# Patient Record
Sex: Male | Born: 1950 | Race: White | Hispanic: No | Marital: Married | Smoking: Current every day smoker
Health system: Southern US, Community
[De-identification: ages and names within clinical notes are randomized; demographics above are authoritative.]

## PROBLEM LIST (undated history)

## (undated) ENCOUNTER — Emergency Department (HOSPITAL_COMMUNITY): Admission: EM | Payer: BC Managed Care – PPO | Source: Home / Self Care

## (undated) DIAGNOSIS — M549 Dorsalgia, unspecified: Secondary | ICD-10-CM

## (undated) DIAGNOSIS — G8929 Other chronic pain: Secondary | ICD-10-CM

## (undated) DIAGNOSIS — E039 Hypothyroidism, unspecified: Secondary | ICD-10-CM

## (undated) DIAGNOSIS — K222 Esophageal obstruction: Secondary | ICD-10-CM

## (undated) DIAGNOSIS — M1990 Unspecified osteoarthritis, unspecified site: Secondary | ICD-10-CM

## (undated) DIAGNOSIS — I4891 Unspecified atrial fibrillation: Secondary | ICD-10-CM

## (undated) DIAGNOSIS — I6529 Occlusion and stenosis of unspecified carotid artery: Secondary | ICD-10-CM

## (undated) DIAGNOSIS — Z8719 Personal history of other diseases of the digestive system: Secondary | ICD-10-CM

## (undated) DIAGNOSIS — I499 Cardiac arrhythmia, unspecified: Secondary | ICD-10-CM

## (undated) DIAGNOSIS — F1020 Alcohol dependence, uncomplicated: Secondary | ICD-10-CM

## (undated) DIAGNOSIS — D649 Anemia, unspecified: Secondary | ICD-10-CM

## (undated) DIAGNOSIS — I10 Essential (primary) hypertension: Secondary | ICD-10-CM

## (undated) DIAGNOSIS — K648 Other hemorrhoids: Secondary | ICD-10-CM

## (undated) DIAGNOSIS — I739 Peripheral vascular disease, unspecified: Secondary | ICD-10-CM

## (undated) DIAGNOSIS — E0590 Thyrotoxicosis, unspecified without thyrotoxic crisis or storm: Secondary | ICD-10-CM

## (undated) DIAGNOSIS — K529 Noninfective gastroenteritis and colitis, unspecified: Secondary | ICD-10-CM

## (undated) DIAGNOSIS — R011 Cardiac murmur, unspecified: Secondary | ICD-10-CM

## (undated) HISTORY — DX: Other hemorrhoids: K64.8

## (undated) HISTORY — DX: Personal history of other diseases of the digestive system: Z87.19

## (undated) HISTORY — DX: Occlusion and stenosis of unspecified carotid artery: I65.29

## (undated) HISTORY — DX: Unspecified osteoarthritis, unspecified site: M19.90

## (undated) HISTORY — DX: Other chronic pain: G89.29

## (undated) HISTORY — PX: OTHER SURGICAL HISTORY: SHX169

## (undated) HISTORY — DX: Other chronic pain: M54.9

## (undated) HISTORY — DX: Alcohol dependence, uncomplicated: F10.20

## (undated) HISTORY — DX: Essential (primary) hypertension: I10

## (undated) HISTORY — DX: Unspecified atrial fibrillation: I48.91

## (undated) HISTORY — DX: Noninfective gastroenteritis and colitis, unspecified: K52.9

## (undated) HISTORY — DX: Esophageal obstruction: K22.2

## (undated) HISTORY — PX: SPINE SURGERY: SHX786

---

## 2001-04-23 ENCOUNTER — Ambulatory Visit (HOSPITAL_COMMUNITY): Admission: RE | Admit: 2001-04-23 | Discharge: 2001-04-23 | Payer: Self-pay | Admitting: Internal Medicine

## 2001-04-23 ENCOUNTER — Encounter: Payer: Self-pay | Admitting: Internal Medicine

## 2001-05-08 ENCOUNTER — Ambulatory Visit (HOSPITAL_COMMUNITY): Admission: RE | Admit: 2001-05-08 | Discharge: 2001-05-08 | Payer: Self-pay | Admitting: Internal Medicine

## 2001-09-21 ENCOUNTER — Ambulatory Visit (HOSPITAL_COMMUNITY): Admission: RE | Admit: 2001-09-21 | Discharge: 2001-09-21 | Payer: Self-pay | Admitting: General Surgery

## 2003-05-09 ENCOUNTER — Ambulatory Visit (HOSPITAL_COMMUNITY): Admission: RE | Admit: 2003-05-09 | Discharge: 2003-05-09 | Payer: Self-pay | Admitting: General Surgery

## 2003-06-18 ENCOUNTER — Emergency Department (HOSPITAL_COMMUNITY): Admission: EM | Admit: 2003-06-18 | Discharge: 2003-06-18 | Payer: Self-pay | Admitting: Emergency Medicine

## 2003-06-26 ENCOUNTER — Emergency Department (HOSPITAL_COMMUNITY): Admission: EM | Admit: 2003-06-26 | Discharge: 2003-06-26 | Payer: Self-pay | Admitting: Emergency Medicine

## 2003-07-02 ENCOUNTER — Ambulatory Visit (HOSPITAL_COMMUNITY): Admission: RE | Admit: 2003-07-02 | Discharge: 2003-07-02 | Payer: Self-pay | Admitting: Family Medicine

## 2003-09-15 ENCOUNTER — Emergency Department (HOSPITAL_COMMUNITY): Admission: EM | Admit: 2003-09-15 | Discharge: 2003-09-15 | Payer: Self-pay | Admitting: Emergency Medicine

## 2003-09-16 ENCOUNTER — Inpatient Hospital Stay (HOSPITAL_COMMUNITY): Admission: EM | Admit: 2003-09-16 | Discharge: 2003-09-19 | Payer: Self-pay | Admitting: Emergency Medicine

## 2003-09-22 ENCOUNTER — Ambulatory Visit (HOSPITAL_COMMUNITY): Admission: RE | Admit: 2003-09-22 | Discharge: 2003-09-22 | Payer: Self-pay | Admitting: Family Medicine

## 2004-03-10 ENCOUNTER — Other Ambulatory Visit (HOSPITAL_COMMUNITY): Admission: RE | Admit: 2004-03-10 | Discharge: 2004-03-19 | Payer: Self-pay | Admitting: Psychiatry

## 2004-12-28 IMAGING — CT CT HEAD W/O CM
1 series · 16 of 30 positions shown, 20 images · non-contrast
Comparison: none

CLINICAL DATA: Seizure.
 CT HEAD WITHOUT CONTRAST
 No prior studies for comparison.
 Brain shows no evidence of edema, hemorrhage, mass effect, hydrocephalus or extraaxial fluid collections.  On the bone windows, there is suggestion of a small amount of fluid in superior mastoid air cells on the left.  This may be consistent with new or old mastoiditis.  No bony destruction is identified.  
 IMPRESSION
 Normal appearance of brain.  Evidence of fluid in a few superior left mastoid air cells.  Correlation is suggested with the possibility of underlying acute versus chronic mastoiditis.  No overt bony destruction seen.

[Series 8722: — · axial · 0.44mm/px · z∈[-652,-517]mm · 16 of 30 slices shown, 20 images]
[im 2/30  brain]
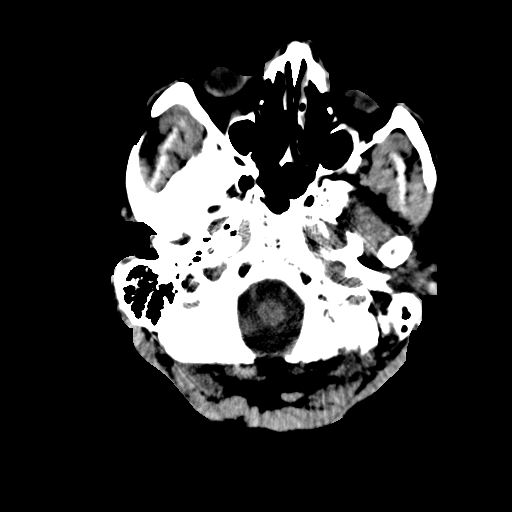
[im 2/30  bone]
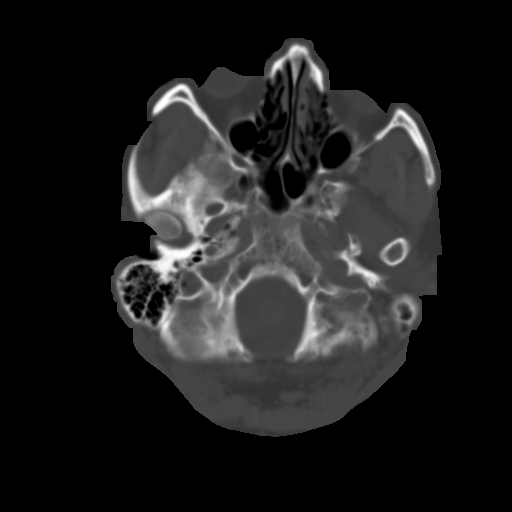
[im 4/30  brain]
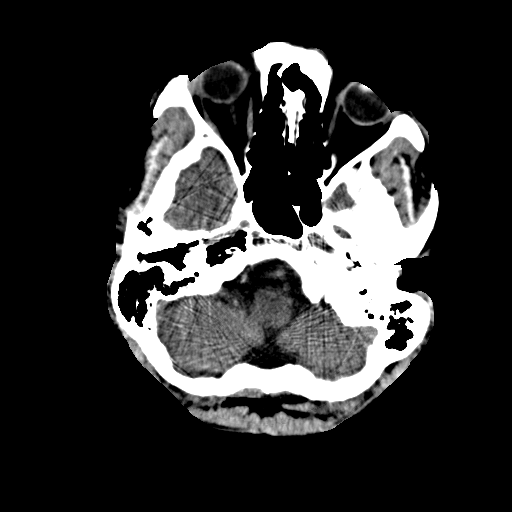
[im 6/30  brain]
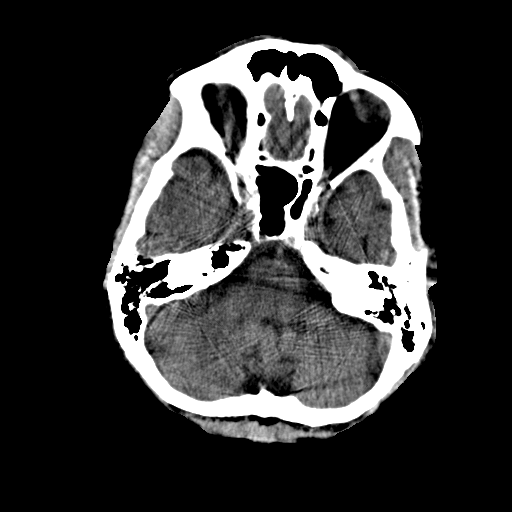
[im 8/30  brain]
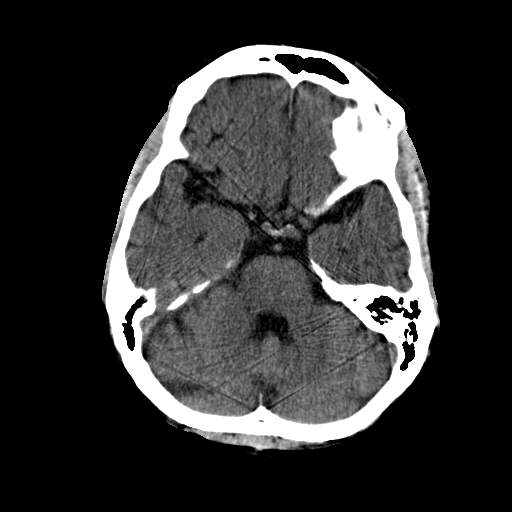
[im 9/30  brain]
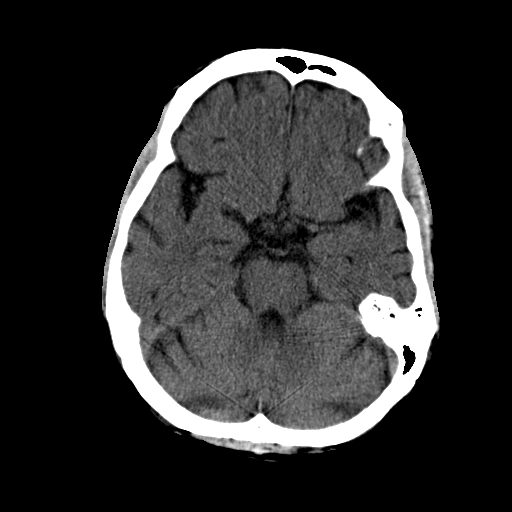
[im 9/30  bone]
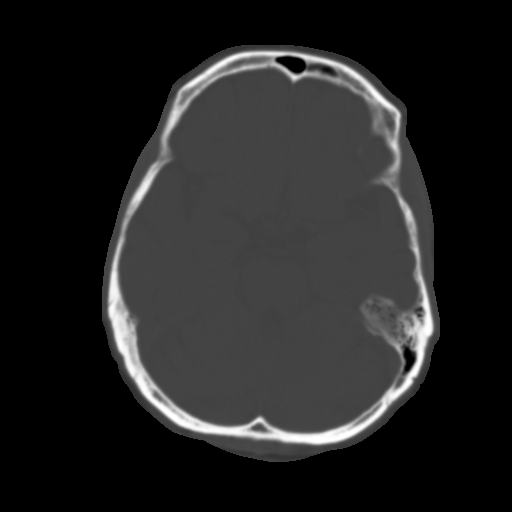
[im 11/30  brain]
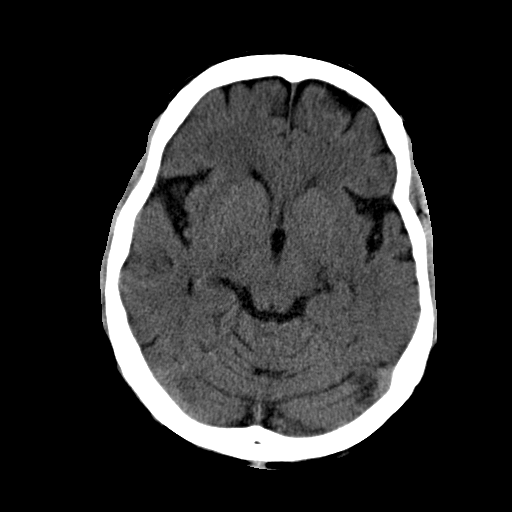
[im 13/30  brain]
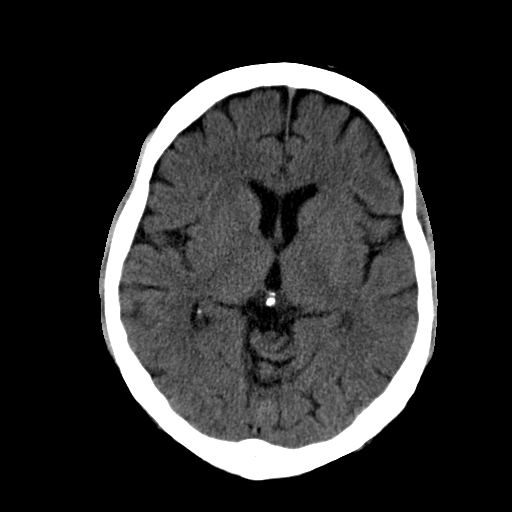
[im 15/30  brain]
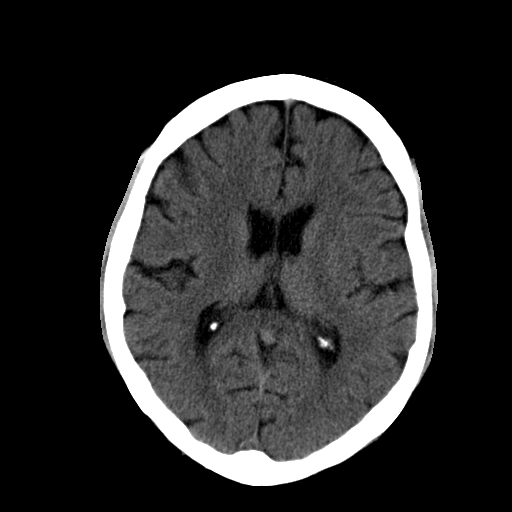
[im 16/30  brain]
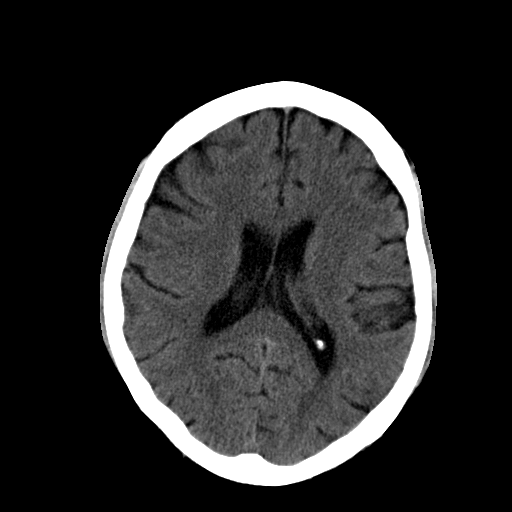
[im 16/30  bone]
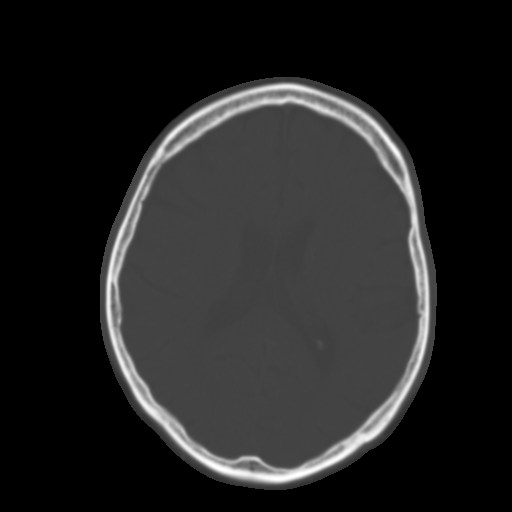
[im 18/30  brain]
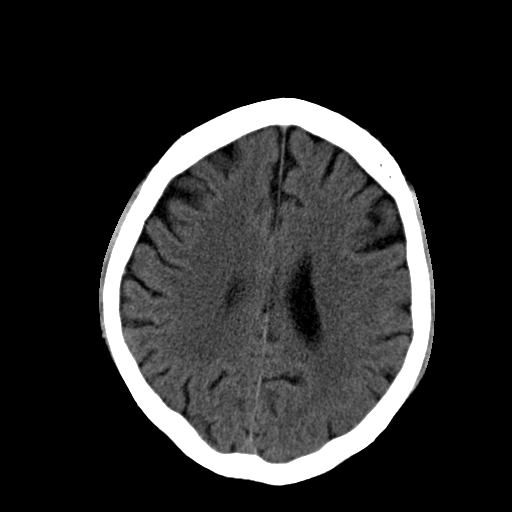
[im 20/30  brain]
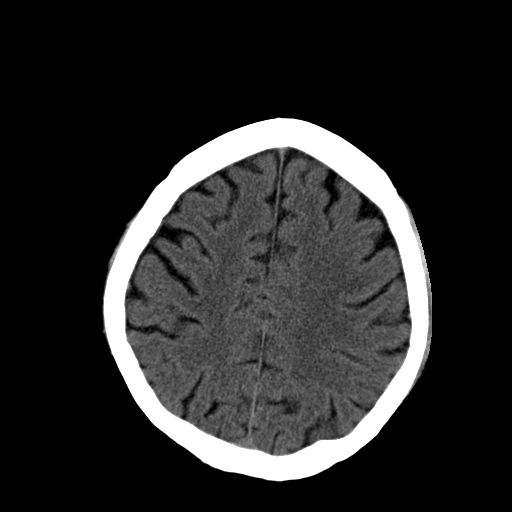
[im 22/30  brain]
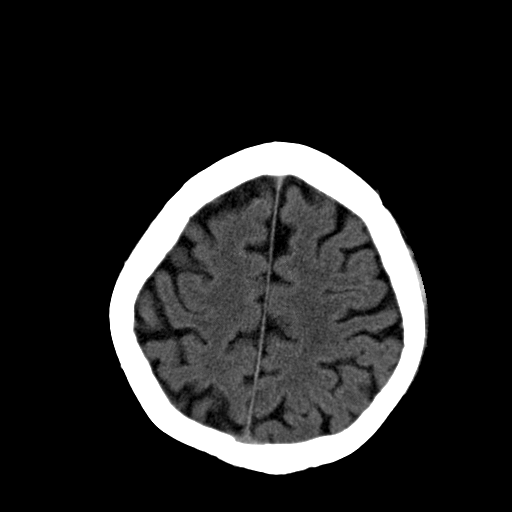
[im 23/30  brain]
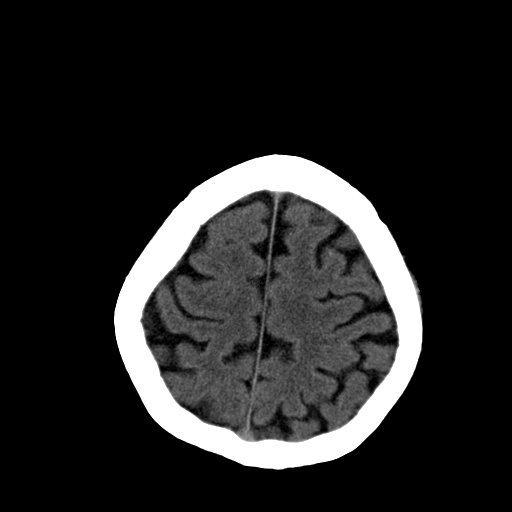
[im 23/30  bone]
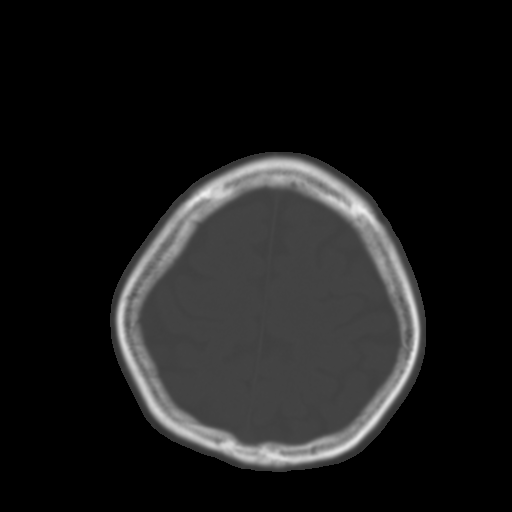
[im 25/30  brain]
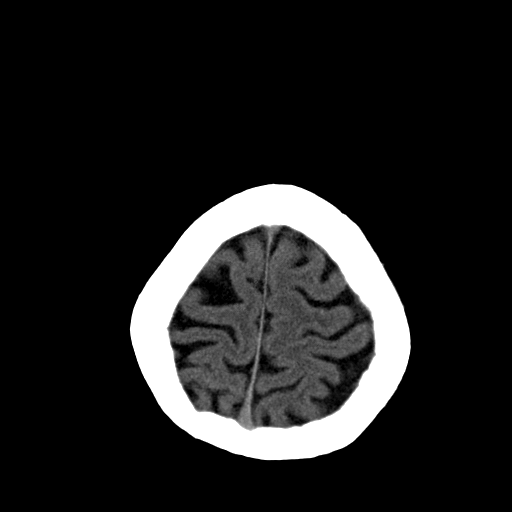
[im 27/30  brain]
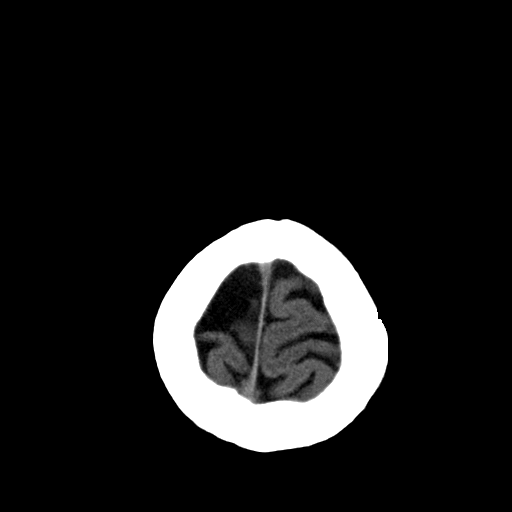
[im 29/30  brain]
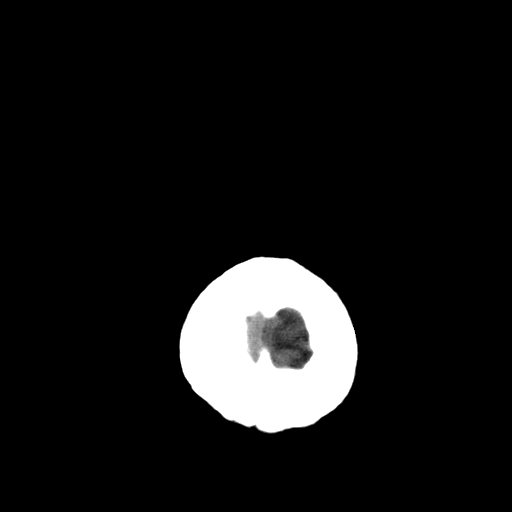

[16 of 30 positions shown; findings below may reference images not displayed]

## 2006-06-02 ENCOUNTER — Ambulatory Visit: Payer: Self-pay | Admitting: Internal Medicine

## 2006-06-02 ENCOUNTER — Ambulatory Visit (HOSPITAL_COMMUNITY): Admission: RE | Admit: 2006-06-02 | Discharge: 2006-06-02 | Payer: Self-pay | Admitting: Internal Medicine

## 2007-06-12 ENCOUNTER — Emergency Department (HOSPITAL_COMMUNITY): Admission: EM | Admit: 2007-06-12 | Discharge: 2007-06-12 | Payer: Self-pay | Admitting: Emergency Medicine

## 2007-07-03 ENCOUNTER — Inpatient Hospital Stay (HOSPITAL_COMMUNITY): Admission: EM | Admit: 2007-07-03 | Discharge: 2007-07-07 | Payer: Self-pay | Admitting: Emergency Medicine

## 2007-07-03 ENCOUNTER — Ambulatory Visit: Payer: Self-pay | Admitting: Cardiology

## 2007-07-05 ENCOUNTER — Ambulatory Visit: Payer: Self-pay | Admitting: Gastroenterology

## 2007-07-07 ENCOUNTER — Ambulatory Visit: Payer: Self-pay | Admitting: Gastroenterology

## 2007-07-07 ENCOUNTER — Encounter: Payer: Self-pay | Admitting: Gastroenterology

## 2007-08-14 ENCOUNTER — Emergency Department (HOSPITAL_COMMUNITY): Admission: EM | Admit: 2007-08-14 | Discharge: 2007-08-14 | Payer: Self-pay | Admitting: Emergency Medicine

## 2007-08-15 ENCOUNTER — Ambulatory Visit: Payer: Self-pay | Admitting: Gastroenterology

## 2008-02-27 ENCOUNTER — Ambulatory Visit: Payer: Self-pay | Admitting: Gastroenterology

## 2008-05-15 ENCOUNTER — Inpatient Hospital Stay (HOSPITAL_COMMUNITY): Admission: EM | Admit: 2008-05-15 | Discharge: 2008-05-19 | Payer: Self-pay | Admitting: Emergency Medicine

## 2008-05-16 ENCOUNTER — Ambulatory Visit: Payer: Self-pay | Admitting: Gastroenterology

## 2008-05-17 ENCOUNTER — Ambulatory Visit: Payer: Self-pay | Admitting: Gastroenterology

## 2008-05-17 ENCOUNTER — Ambulatory Visit: Payer: Self-pay | Admitting: Orthopedic Surgery

## 2008-05-18 ENCOUNTER — Ambulatory Visit: Payer: Self-pay | Admitting: Gastroenterology

## 2008-09-25 DIAGNOSIS — Z8719 Personal history of other diseases of the digestive system: Secondary | ICD-10-CM

## 2008-09-25 DIAGNOSIS — Z8679 Personal history of other diseases of the circulatory system: Secondary | ICD-10-CM | POA: Insufficient documentation

## 2008-09-25 DIAGNOSIS — K2990 Gastroduodenitis, unspecified, without bleeding: Secondary | ICD-10-CM

## 2008-09-25 DIAGNOSIS — K2970 Gastritis, unspecified, without bleeding: Secondary | ICD-10-CM | POA: Insufficient documentation

## 2008-09-25 DIAGNOSIS — F1010 Alcohol abuse, uncomplicated: Secondary | ICD-10-CM | POA: Insufficient documentation

## 2008-09-25 DIAGNOSIS — I4891 Unspecified atrial fibrillation: Secondary | ICD-10-CM

## 2008-09-25 DIAGNOSIS — I10 Essential (primary) hypertension: Secondary | ICD-10-CM

## 2009-09-17 ENCOUNTER — Ambulatory Visit (HOSPITAL_COMMUNITY): Admission: RE | Admit: 2009-09-17 | Discharge: 2009-09-17 | Payer: Self-pay | Admitting: Cardiovascular Disease

## 2009-09-22 ENCOUNTER — Ambulatory Visit (HOSPITAL_COMMUNITY): Admission: RE | Admit: 2009-09-22 | Discharge: 2009-09-22 | Payer: Self-pay | Admitting: Cardiovascular Disease

## 2009-10-08 ENCOUNTER — Ambulatory Visit (HOSPITAL_COMMUNITY): Admission: RE | Admit: 2009-10-08 | Discharge: 2009-10-08 | Payer: Self-pay | Admitting: Internal Medicine

## 2009-10-10 ENCOUNTER — Inpatient Hospital Stay (HOSPITAL_COMMUNITY): Admission: EM | Admit: 2009-10-10 | Discharge: 2009-10-16 | Payer: Self-pay | Admitting: Emergency Medicine

## 2009-10-12 ENCOUNTER — Encounter (INDEPENDENT_AMBULATORY_CARE_PROVIDER_SITE_OTHER): Payer: Self-pay

## 2009-10-12 ENCOUNTER — Ambulatory Visit: Payer: Self-pay | Admitting: Gastroenterology

## 2009-10-21 ENCOUNTER — Telehealth (INDEPENDENT_AMBULATORY_CARE_PROVIDER_SITE_OTHER): Payer: Self-pay

## 2009-11-13 ENCOUNTER — Encounter (HOSPITAL_COMMUNITY): Admission: RE | Admit: 2009-11-13 | Discharge: 2009-12-13 | Payer: Self-pay | Admitting: Oncology

## 2009-11-13 ENCOUNTER — Ambulatory Visit (HOSPITAL_COMMUNITY): Payer: Self-pay | Admitting: Oncology

## 2009-11-18 ENCOUNTER — Ambulatory Visit: Payer: Self-pay | Admitting: Gastroenterology

## 2010-01-07 ENCOUNTER — Ambulatory Visit: Payer: Self-pay | Admitting: Otolaryngology

## 2010-05-26 ENCOUNTER — Emergency Department (HOSPITAL_COMMUNITY)
Admission: EM | Admit: 2010-05-26 | Discharge: 2010-05-26 | Payer: Self-pay | Source: Home / Self Care | Admitting: Emergency Medicine

## 2010-06-18 ENCOUNTER — Ambulatory Visit (HOSPITAL_COMMUNITY)
Admission: RE | Admit: 2010-06-18 | Discharge: 2010-06-18 | Payer: Self-pay | Source: Home / Self Care | Attending: General Surgery | Admitting: General Surgery

## 2010-07-27 NOTE — Progress Notes (Signed)
Summary: Phone note/ OV prior to TCS...referral from Dr. Sherwood Gambler  Phone Note Outgoing Call   Call placed by: Tyler Aas Call placed to: Patient Summary of Call: Referral from Dr. Sherwood Gambler for a colonoscopy. Per Dr. Darrick Penna pt needs to be seen first. Informed pt his appt with Dr. Darrick Penna is on 11/18/2009 @ 11:00 AM. Initial call taken by: Cloria Spring LPN,  October 21, 2009 3:27 PM

## 2010-07-27 NOTE — Miscellaneous (Signed)
Summary: consult  Clinical Lists Changes  NAME:  Todd [name], Todd [name]              ACCOUNT NO.:  [id number]      MEDICAL RECORD NO.:  [id number]         PATIENT TYPE:  PINP      LOCATION:                                FACILITY:  [facility]      PHYSICIAN:  Jonette Eva, M.D.     DATE OF BIRTH:  1950/08/13      DATE OF CONSULTATION:   DATE OF DISCHARGE:                                    CONSULTATION     PRIMARY PHYSICIAN:  Dr. Sherwood Gambler.      REASON FOR CONSULTATION:  Colitis.      HISTORY OF PRESENT ILLNESS:  Todd [name] is a 60 year old male who has   been seen and evaluated by GI since December 2007.  He had a history of   7-mm adenoma removed in 2002.  He had a normal colonoscopy in 2007.  He   presented in January 2009 with AFib with RVR.  His heart rate was being   controlled on digoxin.  He was seen and evaluated by Dr. Pecola Leisure because   he had an acute drop in hemoglobin from 11.9 to 8.6.  He is chronically   maintained on prednisone, allopurinol, and colchicine for gout and   rheumatoid arthritis.  He drinks a fifth of alcohol every single day.   He had an upper endoscopy which showed Candida esophagitis and   gastritis.  Biopsies were negative for H pylori gastritis.  He was asked   to avoid alcohol.  He was seen in followup in February.  He had been   placed on Coumadin for his atrial fibrillation.  He was seen again in   September 2009; he weighed 154 pounds.  He was scheduled for an upper   endoscopy and colonoscopy.  November 2009 these procedures were   performed.  On colonoscopy it was noted that he had tortuous colon which   did not allow for the distal terminal ileum to be intubated.  He had   moderate internal hemorrhoids, patent Schatzki ring, and a normal   stomach.  Rectal bleeding was felt due to hemorrhoids.  He was not seen   again in the office.      He presented to the emergency department on October 10, 2009, with mental   status changes and probable alcohol withdrawal  seizures.  The patient   admits to having alcohol 10 days ago.  The patient denies diarrhea, but   is currently in alcohol withdrawal.  The notes from the emergency   department state that he had vomiting and diarrhea.  Some blood was seen   in his emesis, but also it was apparent that he had bitten his tongue   during either a fall or seizure.  His alcohol level was less than 5 when   he was admitted.  He had no blood noted in his stool.  His INR was 1.06   on admission.  Lactic acid level was noted to be 10.  His initial   temperature was 102.7 rectally with a  systolic blood pressure that   ranged from 148-103 and a heart rate from 174-85.  He was noted to be in   atrial fibrillation with RVR.  CT scan of the abdomen was obtained and   showed colitis.  He has symptomatically improved since his admission on   April 16.      PAST MEDICAL HISTORY:   1. Gout.   2. Rheumatoid arthritis.   3. Hypertension.   4. Alcohol abuse.      PAST SURGICAL HISTORY:  History of anal fissurectomy.      ALLERGIES:  BACITRACIN.      SOCIAL HISTORY:  He is married.  He has 3 grandchildren.  He has 3   children.  He is employed as a Curator.  He smokes.      FAMILY HISTORY:  He has no family history of colon cancer or colon   polyps.      REVIEW OF SYSTEMS:  Somewhat limited due to the patient's altered mental   state.  Per the HPI otherwise all systems are negative.      PHYSICAL EXAM:  Temperature 98.6, blood pressure 113/74, pulse 116.   GENERAL:  He is in no apparent distress.  Alert and interactive.   His HEENT exam is atraumatic, normocephalic.  Sclerae are anicteric   bilaterally.   His neck has full range of motion.  No lymphadenopathy.   His lungs are clear to auscultation bilaterally.   His cardiovascular exam is an irregular rhythm, unable to appreciate a   murmur.   ABDOMEN:  Bowel sounds are present, soft, nontender, nondistended.  No   rebound or guarding.   EXTREMITIES:  No  cyanosis or edema.   NEURO:  He has no focal neurologic deficits.      LABS:  Hemoglobin 11.9 to 10.3, platelets 43.  Potassium 3.2, creatinine   0.92, total bilirubin 2.5 to 2.2, alk phos 96, AST 114 to 69, ALT 31,   albumin 3.1.  White count 8.1 to 5.9.  Ammonia 39.  Heme-negative stool.      RADIOGRAPHIC STUDIES:  Ultrasound on 04/16 shows fatty liver disease.   CT scan of the abdomen and pelvis with IV contrast on April 14 shows   definite thickening of the ascending colon and the left colon as well.   He has mucosal enhancement.  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  He had no   ascites.  I personally reviewed the CT scan with Dr. Pecolia Ades.      ASSESSMENT:  Todd [name] is a 60 year old male who presents with fever,   abdominal pain, nausea, and diarrhea, as well as vomiting.  He had a CT   scan is consistent with colitis.  The differential diagnosis for his   colitis includes an embolic event due to atrial fibrillation, ischemia,   infectious colitis, or inflammatory bowel disease.  Thank you for   allowing me to see Mr. Pettigrew in consultation.  My recommendations   follow.      RECOMMENDATIONS:   1. Would add Flagyl 500 mg t.i.d. to cover for C diff colitis.   2. Would continue broad-spectrum antibiotics in light of the fact that       the patient is chronically immunocompromised and presented with a       temperature of 102.7 rectally.   3. Add Flora Q.   4. Soft mechanical low-fat diet.   5. Continue Protonix.   6. The patient MAY NEED a repeat colonoscopy.  He is not a       candidate for conscious sedation currently because undergoing       alcohol withdrawal.  Will reassess on a daily basis.               Jonette Eva, M.D.            SF/MEDQ  D:  10/12/2009  T:  10/12/2009  Job:  [id number]      cc:   Madelin Rear. Sherwood Gambler, MD   Fax: [phone]      Electronically Signed by Jonette Eva M.D. on 11/19/2009 02:09:02 PM

## 2010-07-27 NOTE — Assessment & Plan Note (Signed)
Summary: COLITIS, ETOH ABUSE   Visit Type:  Follow-up Visit Primary Care Provider:  Sherwood Gambler, M.D.  Chief Complaint:  Colitis.  History of Present Illness: Feels gaining weight but not in size. Has night sweats. Easily fatigued. Dr. Mariel Sleet saw him. No abd pain after eating, diarrhea, rectal bleeding, or constipation. BM two times a day. No problems swallowing, heartburn/indigestion, nausea, vomiting, fever, or chills. Weight increased 5 lbs in past month.   Current Medications (verified): 1)  Omeprazole 20 Mg Cpdr (Omeprazole) .... One By Mouth Daily 30 Minutes Before Breakfast 2)  Metoprolol Tartrate 50 Mg Tabs (Metoprolol Tartrate) .... 1/2 Tablet Daily 3)  Colchicine 0.6 Mg Tabs (Colchicine) .... Once Daily 4)  Hydrochlorothiazide 25 Mg Tabs (Hydrochlorothiazide) .... Once Daily 5)  Klor-Con 20 Meq Pack (Potassium Chloride) .... Take 1 Tablet By Mouth Once A Day 6)  Folic Acid 1 Mg Tabs (Folic Acid) .... Two Tablets Daily 7)  Vitamin 16109 Units .... One Tablet Weekly 8)  Vit B1 .... One Tablet Daily 9)  Magnesium .... Once Daily 10)  Lorazepam 1 Mg Tabs (Lorazepam) .... As Directed 11)  Diltiazem Hcl Er Beads 360 Mg Xr24h-Cap (Diltiazem Hcl Er Beads) .... Take 1 Tablet By Mouth Once A Day 12)  Mirtazapine 15 Mg Tabs (Mirtazapine) .... Take 1 Tab By Mouth At Bedtime  Allergies (verified): No Known Drug Allergies  Past History:  Past Medical History: EToh abuse GOUT:  2-3 flares/year Atrial Fibrillation Infectious Colitis 2011, resolved EGD NOV 2009-SCHATZKI'S RING EGD JAN 2009: CANDIDA ESOPHAGITIS TCS: 2009: internaL hemorrhoids TCS 2007: normal TCS 2002: SIMPLE ADENOMA  Past Surgical History: Reviewed history from 09/25/2008 and no changes required. back surgery  knee surgery fissure  Vital Signs:  Patient profile:   60 year old male Height:      68 inches Weight:      131 pounds BMI:     19.99 Temp:     98.3 degrees F oral Pulse rate:   80 / minute BP  sitting:   120 / 80  (left arm) Cuff size:   regular  Vitals Entered By: Cloria Spring LPN (Nov 18, 2009 10:50 AM)  Physical Exam  General:  Well developed, well nourished, no acute distress. Head:  Normocephalic and atraumatic. Eyes:  PERRLA, no icterus. Mouth:  No deformity or lesions. Neck:  Supple; no masses. Lungs:  Clear throughout to auscultation. Heart:  IRRegular rate and rhythm; no murmurs. Abdomen:  Soft, nontender and nondistended. Normal bowel sounds. Neurologic:  Alert and  oriented x4;  grossly normal neurologically.  Impression & Recommendations:  Problem # 1:  COLITIS, HX OF (ICD-V12.79) Assessment Improved  No further workup needed. Keep taking your folic acid. It is helping your blood counts. As you blood counts improve you will have more energy. Add protein powder (chocolate or vanilla) to milk shakes daily. Return visit in 3 mos. WILL FOLLOW hB/HCT.  CC: PCP  Orders: Est. Patient Level II (60454)  Patient Instructions: 1)  Keep taking your folic acid. It is helping your blood counts. 2)  As you blood counts improve you will have more energy. 3)  Add protein powder (chocolate or vanilla) to milk shakes daily. 4)  Return visit in 3 mos. 5)  The medication list was reviewed and reconciled.  All changed / newly prescribed medications were explained.  A complete medication list was provided to the patient / caregiver.  Appended Document: COLITIS, ETOH ABUSE reminder in computer

## 2010-09-06 LAB — SURGICAL PCR SCREEN
MRSA, PCR: NEGATIVE
Staphylococcus aureus: NEGATIVE

## 2010-09-06 LAB — CBC
Hemoglobin: 12.1 g/dL — ABNORMAL LOW (ref 13.0–17.0)
Platelets: 218 10*3/uL (ref 150–400)

## 2010-09-06 LAB — BASIC METABOLIC PANEL
Calcium: 8.2 mg/dL — ABNORMAL LOW (ref 8.4–10.5)
Chloride: 103 mEq/L (ref 96–112)
Creatinine, Ser: 1.13 mg/dL (ref 0.4–1.5)
Glucose, Bld: 88 mg/dL (ref 70–99)
Potassium: 4 mEq/L (ref 3.5–5.1)
Sodium: 140 mEq/L (ref 135–145)

## 2010-09-07 LAB — DIFFERENTIAL
Neutro Abs: 5.8 10*3/uL (ref 1.7–7.7)
Neutrophils Relative %: 76 % (ref 43–77)

## 2010-09-07 LAB — COMPREHENSIVE METABOLIC PANEL
AST: 133 U/L — ABNORMAL HIGH (ref 0–37)
Albumin: 3.5 g/dL (ref 3.5–5.2)
Calcium: 8.3 mg/dL — ABNORMAL LOW (ref 8.4–10.5)
GFR calc Af Amer: 52 mL/min — ABNORMAL LOW (ref >60)
GFR calc non Af Amer: 43 mL/min — ABNORMAL LOW (ref >60)
Total Bilirubin: 1.2 mg/dL (ref 0.3–1.2)
Total Protein: 6.5 g/dL (ref 6.0–8.3)

## 2010-09-07 LAB — CBC
MCH: 32.8 pg (ref 26.0–34.0)
MCV: 95.2 fL (ref 78.0–100.0)
RBC: 4.21 MIL/uL — ABNORMAL LOW (ref 4.22–5.81)
WBC: 7.7 10*3/uL (ref 4.0–10.5)

## 2010-09-07 LAB — ETHANOL: Alcohol, Ethyl (B): <5 mg/dL (ref 0–10)

## 2010-09-13 LAB — DIFFERENTIAL
Basophils Relative: 1 % (ref 0–1)
Eosinophils Relative: 2 % (ref 0–5)
Monocytes Absolute: 1.3 10*3/uL — ABNORMAL HIGH (ref 0.1–1.0)

## 2010-09-13 LAB — CBC
HCT: 34.6 % — ABNORMAL LOW (ref 39.0–52.0)
Hemoglobin: 12 g/dL — ABNORMAL LOW (ref 13.0–17.0)
Platelets: 258 10*3/uL (ref 150–400)
RDW: 16.4 % — ABNORMAL HIGH (ref 11.5–15.5)

## 2010-09-14 LAB — DIFFERENTIAL
Basophils Absolute: 0 10*3/uL (ref 0.0–0.1)
Basophils Relative: 0 % (ref 0–1)
Basophils Relative: 0 % (ref 0–1)
Eosinophils Absolute: 0 10*3/uL (ref 0.0–0.7)
Eosinophils Absolute: 0.1 10*3/uL (ref 0.0–0.7)
Eosinophils Relative: 0 % (ref 0–5)
Eosinophils Relative: 0 % (ref 0–5)
Eosinophils Relative: 1 % (ref 0–5)
Lymphocytes Relative: 10 % — ABNORMAL LOW (ref 12–46)
Lymphocytes Relative: 18 % (ref 12–46)
Lymphocytes Relative: 21 % (ref 12–46)
Lymphs Abs: 0.5 10*3/uL — ABNORMAL LOW (ref 0.7–4.0)
Lymphs Abs: 1 10*3/uL (ref 0.7–4.0)
Lymphs Abs: 1.3 10*3/uL (ref 0.7–4.0)
Lymphs Abs: 1.3 10*3/uL (ref 0.7–4.0)
Monocytes Relative: 18 % — ABNORMAL HIGH (ref 3–12)
Monocytes Relative: 22 % — ABNORMAL HIGH (ref 3–12)
Monocytes Relative: 7 % (ref 3–12)
Neutro Abs: 4 10*3/uL (ref 1.7–7.7)
Neutro Abs: 7.2 10*3/uL (ref 1.7–7.7)
Neutrophils Relative %: 64 % (ref 43–77)
Neutrophils Relative %: 65 % (ref 43–77)
Neutrophils Relative %: 83 % — ABNORMAL HIGH (ref 43–77)
Neutrophils Relative %: 89 % — ABNORMAL HIGH (ref 43–77)

## 2010-09-14 LAB — CBC
HCT: 28 % — ABNORMAL LOW (ref 39.0–52.0)
HCT: 29.2 % — ABNORMAL LOW (ref 39.0–52.0)
HCT: 29.3 % — ABNORMAL LOW (ref 39.0–52.0)
HCT: 30.1 % — ABNORMAL LOW (ref 39.0–52.0)
HCT: 33.5 % — ABNORMAL LOW (ref 39.0–52.0)
Hemoglobin: 10.3 g/dL — ABNORMAL LOW (ref 13.0–17.0)
Hemoglobin: 11.6 g/dL — ABNORMAL LOW (ref 13.0–17.0)
MCHC: 34.6 g/dL (ref 30.0–36.0)
MCHC: 35.1 g/dL (ref 30.0–36.0)
MCV: 106.1 fL — ABNORMAL HIGH (ref 78.0–100.0)
MCV: 106.1 fL — ABNORMAL HIGH (ref 78.0–100.0)
MCV: 106.6 fL — ABNORMAL HIGH (ref 78.0–100.0)
MCV: 107.1 fL — ABNORMAL HIGH (ref 78.0–100.0)
MCV: 107.2 fL — ABNORMAL HIGH (ref 78.0–100.0)
MCV: 108.2 fL — ABNORMAL HIGH (ref 78.0–100.0)
Platelets: 36 10*3/uL — ABNORMAL LOW (ref 150–400)
Platelets: 43 10*3/uL — ABNORMAL LOW (ref 150–400)
Platelets: 92 10*3/uL — ABNORMAL LOW (ref 150–400)
RBC: 2.61 MIL/uL — ABNORMAL LOW (ref 4.22–5.81)
RBC: 2.7 MIL/uL — ABNORMAL LOW (ref 4.22–5.81)
RBC: 2.84 MIL/uL — ABNORMAL LOW (ref 4.22–5.81)
RBC: 3.14 MIL/uL — ABNORMAL LOW (ref 4.22–5.81)
RDW: 18.8 % — ABNORMAL HIGH (ref 11.5–15.5)
RDW: 19.1 % — ABNORMAL HIGH (ref 11.5–15.5)
WBC: 5.8 10*3/uL (ref 4.0–10.5)
WBC: 5.9 10*3/uL (ref 4.0–10.5)
WBC: 6.1 10*3/uL (ref 4.0–10.5)
WBC: 6.6 10*3/uL (ref 4.0–10.5)
WBC: 7.4 10*3/uL (ref 4.0–10.5)

## 2010-09-14 LAB — COMPREHENSIVE METABOLIC PANEL
ALT: 31 U/L (ref 0–53)
AST: 111 U/L — ABNORMAL HIGH (ref 0–37)
AST: 69 U/L — ABNORMAL HIGH (ref 0–37)
Albumin: 2.7 g/dL — ABNORMAL LOW (ref 3.5–5.2)
BUN: 1 mg/dL — ABNORMAL LOW (ref 6–23)
BUN: 4 mg/dL — ABNORMAL LOW (ref 6–23)
CO2: 20 mEq/L (ref 19–32)
CO2: 24 mEq/L (ref 19–32)
Calcium: 7 mg/dL — ABNORMAL LOW (ref 8.4–10.5)
Calcium: 7.6 mg/dL — ABNORMAL LOW (ref 8.4–10.5)
Chloride: 100 mEq/L (ref 96–112)
Chloride: 105 mEq/L (ref 96–112)
Creatinine, Ser: 0.87 mg/dL (ref 0.4–1.5)
Creatinine, Ser: 1.07 mg/dL (ref 0.4–1.5)
Creatinine, Ser: 1.42 mg/dL (ref 0.4–1.5)
GFR calc Af Amer: >60 mL/min (ref >60)
GFR calc Af Amer: >60 mL/min (ref >60)
GFR calc non Af Amer: 51 mL/min — ABNORMAL LOW (ref >60)
GFR calc non Af Amer: >60 mL/min (ref >60)
Glucose, Bld: 139 mg/dL — ABNORMAL HIGH (ref 70–99)
Sodium: 133 mEq/L — ABNORMAL LOW (ref 135–145)
Total Bilirubin: 1.4 mg/dL — ABNORMAL HIGH (ref 0.3–1.2)
Total Bilirubin: 2.2 mg/dL — ABNORMAL HIGH (ref 0.3–1.2)
Total Protein: 5.4 g/dL — ABNORMAL LOW (ref 6.0–8.3)

## 2010-09-14 LAB — FOLATE: Folate: 7.7 ng/mL

## 2010-09-14 LAB — RENAL FUNCTION PANEL
Albumin: 2.3 g/dL — ABNORMAL LOW (ref 3.5–5.2)
Albumin: 2.5 g/dL — ABNORMAL LOW (ref 3.5–5.2)
BUN: 3 mg/dL — ABNORMAL LOW (ref 6–23)
CO2: 24 mEq/L (ref 19–32)
Chloride: 108 mEq/L (ref 96–112)
Chloride: 110 mEq/L (ref 96–112)
GFR calc Af Amer: >60 mL/min (ref >60)
GFR calc non Af Amer: >60 mL/min (ref >60)
GFR calc non Af Amer: >60 mL/min (ref >60)
Potassium: 4.5 mEq/L (ref 3.5–5.1)
Potassium: 5 mEq/L (ref 3.5–5.1)

## 2010-09-14 LAB — ETHANOL: Alcohol, Ethyl (B): <5 mg/dL (ref 0–10)

## 2010-09-14 LAB — CLOSTRIDIUM DIFFICILE EIA: C difficile Toxins A+B, EIA: NEGATIVE

## 2010-09-14 LAB — BASIC METABOLIC PANEL
BUN: 2 mg/dL — ABNORMAL LOW (ref 6–23)
BUN: 5 mg/dL — ABNORMAL LOW (ref 6–23)
BUN: 6 mg/dL (ref 6–23)
Chloride: 108 mEq/L (ref 96–112)
Chloride: 109 mEq/L (ref 96–112)
Creatinine, Ser: 0.92 mg/dL (ref 0.4–1.5)
Creatinine, Ser: 1.14 mg/dL (ref 0.4–1.5)
GFR calc Af Amer: >60 mL/min (ref >60)
GFR calc Af Amer: >60 mL/min (ref >60)
GFR calc non Af Amer: >60 mL/min (ref >60)
GFR calc non Af Amer: >60 mL/min (ref >60)
GFR calc non Af Amer: >60 mL/min (ref >60)
GFR calc non Af Amer: >60 mL/min (ref >60)
Glucose, Bld: 107 mg/dL — ABNORMAL HIGH (ref 70–99)
Glucose, Bld: 169 mg/dL — ABNORMAL HIGH (ref 70–99)
Potassium: 3.1 mEq/L — ABNORMAL LOW (ref 3.5–5.1)
Potassium: 4.6 mEq/L (ref 3.5–5.1)
Potassium: 4.7 mEq/L (ref 3.5–5.1)
Sodium: 132 mEq/L — ABNORMAL LOW (ref 135–145)
Sodium: 138 mEq/L (ref 135–145)
Sodium: 139 mEq/L (ref 135–145)

## 2010-09-14 LAB — IRON AND TIBC
Saturation Ratios: 30 % (ref 20–55)
TIBC: 158 ug/dL — ABNORMAL LOW (ref 215–435)
UIBC: 111 ug/dL

## 2010-09-14 LAB — CULTURE, BLOOD (ROUTINE X 2)
Culture: NO GROWTH
Report Status: 4212011

## 2010-09-14 LAB — CROSSMATCH

## 2010-09-14 LAB — PHOSPHORUS: Phosphorus: 2 mg/dL — ABNORMAL LOW (ref 2.3–4.6)

## 2010-09-14 LAB — AMMONIA: Ammonia: 38 umol/L — ABNORMAL HIGH (ref 11–35)

## 2010-09-14 LAB — MAGNESIUM
Magnesium: 0.9 mg/dL — CL (ref 1.5–2.5)
Magnesium: 1.1 mg/dL — ABNORMAL LOW (ref 1.5–2.5)
Magnesium: 1.3 mg/dL — ABNORMAL LOW (ref 1.5–2.5)
Magnesium: 1.7 mg/dL (ref 1.5–2.5)
Magnesium: 1.8 mg/dL (ref 1.5–2.5)

## 2010-09-14 LAB — URINE MICROSCOPIC-ADD ON

## 2010-09-14 LAB — POTASSIUM: Potassium: 3 mEq/L — ABNORMAL LOW (ref 3.5–5.1)

## 2010-09-14 LAB — URINE CULTURE

## 2010-09-14 LAB — VITAMIN B12: Vitamin B-12: 678 pg/mL (ref 211–911)

## 2010-09-14 LAB — HEPARIN LEVEL (UNFRACTIONATED): Heparin Unfractionated: <0.1 IU/mL — ABNORMAL LOW (ref 0.30–0.70)

## 2010-09-14 LAB — URINALYSIS, ROUTINE W REFLEX MICROSCOPIC
Glucose, UA: NEGATIVE mg/dL
Specific Gravity, Urine: 1.01 (ref 1.005–1.030)

## 2010-09-14 LAB — URIC ACID: Uric Acid, Serum: 5.2 mg/dL (ref 4.0–7.8)

## 2010-09-14 LAB — CK TOTAL AND CKMB (NOT AT ARMC): CK, MB: 2.4 ng/mL (ref 0.3–4.0)

## 2010-09-14 LAB — MRSA CULTURE

## 2010-09-14 LAB — PROTIME-INR: Prothrombin Time: 13.7 seconds (ref 11.6–15.2)

## 2010-09-14 LAB — LACTIC ACID, PLASMA: Lactic Acid, Venous: 10 mmol/L — ABNORMAL HIGH (ref 0.5–2.2)

## 2010-10-05 ENCOUNTER — Inpatient Hospital Stay (HOSPITAL_COMMUNITY)
Admission: EM | Admit: 2010-10-05 | Discharge: 2010-10-06 | DRG: 541 | Disposition: A | Discharge status: Home / Self Care | Payer: BC Managed Care – PPO | Attending: General Surgery | Admitting: General Surgery

## 2010-10-05 ENCOUNTER — Encounter (HOSPITAL_COMMUNITY): Payer: Self-pay | Admitting: Radiology

## 2010-10-05 ENCOUNTER — Observation Stay (HOSPITAL_COMMUNITY): Payer: BC Managed Care – PPO

## 2010-10-05 ENCOUNTER — Emergency Department (HOSPITAL_COMMUNITY): Payer: BC Managed Care – PPO

## 2010-10-05 DIAGNOSIS — E876 Hypokalemia: Secondary | ICD-10-CM | POA: Diagnosis present

## 2010-10-05 DIAGNOSIS — Z862 Personal history of diseases of the blood and blood-forming organs and certain disorders involving the immune mechanism: Secondary | ICD-10-CM | POA: Insufficient documentation

## 2010-10-05 DIAGNOSIS — S0003XA Contusion of scalp, initial encounter: Secondary | ICD-10-CM | POA: Diagnosis present

## 2010-10-05 DIAGNOSIS — I4891 Unspecified atrial fibrillation: Secondary | ICD-10-CM | POA: Diagnosis present

## 2010-10-05 DIAGNOSIS — F1010 Alcohol abuse, uncomplicated: Secondary | ICD-10-CM | POA: Diagnosis present

## 2010-10-05 DIAGNOSIS — R569 Unspecified convulsions: Secondary | ICD-10-CM | POA: Insufficient documentation

## 2010-10-05 DIAGNOSIS — Z8639 Personal history of other endocrine, nutritional and metabolic disease: Secondary | ICD-10-CM | POA: Insufficient documentation

## 2010-10-05 DIAGNOSIS — I10 Essential (primary) hypertension: Secondary | ICD-10-CM | POA: Diagnosis present

## 2010-10-05 DIAGNOSIS — R404 Transient alteration of awareness: Secondary | ICD-10-CM | POA: Insufficient documentation

## 2010-10-05 DIAGNOSIS — M549 Dorsalgia, unspecified: Secondary | ICD-10-CM | POA: Insufficient documentation

## 2010-10-05 DIAGNOSIS — H9209 Otalgia, unspecified ear: Secondary | ICD-10-CM | POA: Insufficient documentation

## 2010-10-05 DIAGNOSIS — I129 Hypertensive chronic kidney disease with stage 1 through stage 4 chronic kidney disease, or unspecified chronic kidney disease: Secondary | ICD-10-CM | POA: Insufficient documentation

## 2010-10-05 DIAGNOSIS — R10812 Left upper quadrant abdominal tenderness: Secondary | ICD-10-CM | POA: Insufficient documentation

## 2010-10-05 DIAGNOSIS — S2239XA Fracture of one rib, unspecified side, initial encounter for closed fracture: Principal | ICD-10-CM | POA: Diagnosis present

## 2010-10-05 DIAGNOSIS — R51 Headache: Secondary | ICD-10-CM | POA: Insufficient documentation

## 2010-10-05 DIAGNOSIS — R000 Tachycardia, unspecified: Secondary | ICD-10-CM | POA: Insufficient documentation

## 2010-10-05 DIAGNOSIS — N189 Chronic kidney disease, unspecified: Secondary | ICD-10-CM | POA: Insufficient documentation

## 2010-10-05 DIAGNOSIS — N179 Acute kidney failure, unspecified: Secondary | ICD-10-CM | POA: Diagnosis present

## 2010-10-05 DIAGNOSIS — F17200 Nicotine dependence, unspecified, uncomplicated: Secondary | ICD-10-CM | POA: Diagnosis present

## 2010-10-05 DIAGNOSIS — M109 Gout, unspecified: Secondary | ICD-10-CM | POA: Diagnosis present

## 2010-10-05 DIAGNOSIS — IMO0002 Reserved for concepts with insufficient information to code with codable children: Secondary | ICD-10-CM | POA: Insufficient documentation

## 2010-10-05 DIAGNOSIS — R109 Unspecified abdominal pain: Secondary | ICD-10-CM | POA: Insufficient documentation

## 2010-10-05 LAB — COMPREHENSIVE METABOLIC PANEL
AST: 83 U/L — ABNORMAL HIGH (ref 0–37)
Albumin: 3.4 g/dL — ABNORMAL LOW (ref 3.5–5.2)
Alkaline Phosphatase: 101 U/L (ref 39–117)
Chloride: 97 meq/L (ref 96–112)
Creatinine, Ser: 2.85 mg/dL — ABNORMAL HIGH (ref 0.4–1.5)
GFR calc Af Amer: 28 mL/min — ABNORMAL LOW (ref >60)
Potassium: 2.4 meq/L — CL (ref 3.5–5.1)
Sodium: 136 meq/L (ref 135–145)
Total Bilirubin: 2 mg/dL — ABNORMAL HIGH (ref 0.3–1.2)

## 2010-10-05 LAB — CBC
HCT: 38.7 % — ABNORMAL LOW (ref 39.0–52.0)
Hemoglobin: 13.8 g/dL (ref 13.0–17.0)
MCH: 34 pg (ref 26.0–34.0)
MCHC: 34.6 g/dL (ref 30.0–36.0)
Platelets: 50 10*3/uL — ABNORMAL LOW (ref 150–400)
RBC: 3.95 MIL/uL — ABNORMAL LOW (ref 4.22–5.81)
RDW: 14.6 % (ref 11.5–15.5)
WBC: 9.4 10*3/uL (ref 4.0–10.5)

## 2010-10-05 LAB — POCT I-STAT, CHEM 8
BUN: 15 mg/dL (ref 6–23)
Calcium, Ion: 0.91 mmol/L — ABNORMAL LOW (ref 1.12–1.32)
Chloride: 96 mEq/L (ref 96–112)
HCT: 42 % (ref 39.0–52.0)
Sodium: 136 mEq/L (ref 135–145)
TCO2: 22 mmol/L (ref 0–100)

## 2010-10-05 LAB — RAPID URINE DRUG SCREEN, HOSP PERFORMED
Amphetamines: NOT DETECTED
Barbiturates: NOT DETECTED
Benzodiazepines: NOT DETECTED
Opiates: NOT DETECTED
Tetrahydrocannabinol: NOT DETECTED

## 2010-10-05 LAB — URINALYSIS, ROUTINE W REFLEX MICROSCOPIC
Nitrite: NEGATIVE
Protein, ur: 30 mg/dL — AB
Specific Gravity, Urine: <1.005 — ABNORMAL LOW (ref 1.005–1.030)
Urobilinogen, UA: 1 mg/dL (ref 0.0–1.0)

## 2010-10-05 LAB — ABO/RH: ABO/RH(D): O NEG

## 2010-10-05 LAB — PROTIME-INR: INR: 1.01 (ref 0.00–1.49)

## 2010-10-05 MED ORDER — IOHEXOL 300 MG/ML  SOLN
100.0000 mL | Freq: Once | INTRAMUSCULAR | Status: AC | PRN
  Administered 2010-10-05: 100 mL via INTRAVENOUS

## 2010-10-06 DIAGNOSIS — I4891 Unspecified atrial fibrillation: Secondary | ICD-10-CM

## 2010-10-06 LAB — TYPE AND SCREEN
Unit division: 0
Unit division: 0

## 2010-10-06 LAB — BASIC METABOLIC PANEL
BUN: 12 mg/dL (ref 6–23)
BUN: 10 mg/dL (ref 6–23)
Calcium: 7.8 mg/dL — ABNORMAL LOW (ref 8.4–10.5)
Chloride: 99 mEq/L (ref 96–112)
Chloride: 96 mEq/L (ref 96–112)
Creatinine, Ser: 1.35 mg/dL (ref 0.4–1.5)
GFR calc Af Amer: 56 mL/min — ABNORMAL LOW (ref >60)
GFR calc non Af Amer: 46 mL/min — ABNORMAL LOW (ref >60)
Potassium: 2.5 mEq/L — CL (ref 3.5–5.1)
Sodium: 133 mEq/L — ABNORMAL LOW (ref 135–145)

## 2010-10-06 LAB — CBC
Platelets: 45 10*3/uL — ABNORMAL LOW (ref 150–400)
RBC: 3.34 MIL/uL — ABNORMAL LOW (ref 4.22–5.81)
RDW: 14.6 % (ref 11.5–15.5)
WBC: 8.4 10*3/uL (ref 4.0–10.5)

## 2010-10-06 LAB — CARDIAC PANEL(CRET KIN+CKTOT+MB+TROPI)
CK, MB: 2.5 ng/mL (ref 0.3–4.0)
Relative Index: 1.6 (ref 0.0–2.5)
Total CK: 154 U/L (ref 7–232)
Troponin I: 0.04 ng/mL (ref 0.00–0.06)
Troponin I: 0.04 ng/mL (ref 0.00–0.06)

## 2010-10-06 LAB — POTASSIUM: Potassium: 3.3 mEq/L — ABNORMAL LOW (ref 3.5–5.1)
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
## 2010-11-02 ENCOUNTER — Encounter (HOSPITAL_COMMUNITY): Payer: Self-pay

## 2010-11-02 ENCOUNTER — Ambulatory Visit (HOSPITAL_COMMUNITY)
Admission: RE | Admit: 2010-11-02 | Discharge: 2010-11-02 | Disposition: A | Discharge status: Home / Self Care | Payer: BC Managed Care – PPO | Source: Ambulatory Visit | Attending: Internal Medicine | Admitting: Internal Medicine

## 2010-11-02 ENCOUNTER — Other Ambulatory Visit (HOSPITAL_COMMUNITY): Payer: Self-pay | Admitting: Internal Medicine

## 2010-11-02 DIAGNOSIS — M1120 Other chondrocalcinosis, unspecified site: Secondary | ICD-10-CM | POA: Insufficient documentation

## 2010-11-02 DIAGNOSIS — M25569 Pain in unspecified knee: Secondary | ICD-10-CM | POA: Insufficient documentation

## 2010-11-02 DIAGNOSIS — M238X9 Other internal derangements of unspecified knee: Secondary | ICD-10-CM

## 2010-11-09 NOTE — Consult Note (Signed)
NAME:  Todd [name], Todd [name] NO.:  [id number]   MEDICAL RECORD NO.:  [id number]          PATIENT TYPE:  INP   LOCATION:  A220                          FACILITY:  [facility]   PHYSICIAN:  Gerrit Friends. Dietrich Pates, MD, FACCDATE OF BIRTH:  [birth year]   DATE OF CONSULTATION:  07/04/2007  DATE OF DISCHARGE:                                 CONSULTATION   PRIMARY CARE PHYSICIAN:  Dr. Sherwood Gambler.   CARDIOLOGIST:  He will be new to Dr. [place] Bing   REASON FOR REFERRAL:  Atrial fibrillation.   HISTORY OF PRESENT ILLNESS:  Todd [name] is a 60 year old male patient,  brother of Nita Sells who reports a history of heart murmur diagnosed  after back surgery in the 1980s who apparently had a normal  echocardiogram at that time.  He presented to the emergency room on  July 03, 2007, with orthostatic hypotension after recent viral  gastroenteritis.  The patient states he felt like his potassium was  getting low.  He apparently takes a diuretic at home and notes that he  gets dizzy when his potassium is getting low.  He is also noted to be a  heavy drinker by his family.  He admitted to the hospitalist physician  on admission that he was drinking one to two gallons of liquor per day.  On admission, he was noted to have a low potassium as well as a low  magnesium with his initial blood workup.  His blood pressure was  recorded to drop from 106/76 to 62/49 from lying to standing.  He also  apparently had elevated heart rates with standing at times in the 100-  200s.  His ECG confirmed atrial fibrillation with rapid ventricular rate  and he was loaded with digoxin.  The patient denies any recent chest  pain, shortness of breath, orthopnea, PND or syncope.  He has a gout  flare in both feet and the metatarsophalangeal joint.  He noted some  edema with that.  He denies any palpitations.  His heart rate is in the  80s upon initial interview.  His troponins minimally elevated to 0.12  and his BNP at  599.   PAST MEDICAL HISTORY:  1. Hypertension.  2. Gout.  3. Osteoarthritis.  4. Status post right knee arthroscopy.  5. History of spinal surgery secondary to herniated nucleus pulposus      in 1980s.   MEDICATIONS AT HOME:  1. Prednisone 10 mg 2 tablets daily.  2. Hydrochlorothiazide 25 mg half tablet daily.  3. Allopurinol 300 mg daily.  4. Colchicine 0.6 mg b.i.d.  5. Metoprolol ER 100 mg daily.   ALLERGIES:  NO KNOWN DRUG ALLERGIES.   SOCIAL HISTORY:  The patient lives in [place] with his wife.  He has  three children.  He works at [employer].  He is a heavy drinker as  outlined above.  He smokes two packs per day for several years for an 33-  pack-year history.   FAMILY HISTORY:  Significant for coronary disease.Marland Kitchen  His father died his  46s of congestive heart failure after myocardial infarction.Marland Kitchen  He  has a  sister who had a myocardial infarction at age 17.   REVIEW OF SYSTEMS:  Please see HPI.  Denies any fevers, chills.  He has  had 20 pounds weight loss in about four months.  He denies headaches or  sore throat.  Denies rash.  He has had a nonproductive cough recently.  Several family members have been ill and he thought he had a virus.  As  noted above. he had recent episode of nausea, vomiting and diarrhea  attributed to a viral illness.  He denies dysuria, hematuria.  Denies  any melena or hematochezia.  Denies dysphagia or odynophagia.  As noted  above. he has had bilateral pain in his metatarsophalangeal joints  consistent with gout.  Rest of the review of systems are negative.   PHYSICAL EXAMINATION:  GENERAL:  He is well-nourished, well-developed  male.  VITAL SIGNS:  Blood pressure 110/72, pulse 88, respirations 24,  temperature 98.85, oxygen saturation 95% on room air.  HEENT:  Normal.  NECK:  Without JVD.  LYMPH:  Without lymphadenopathy.  ENDOCRINE:  Without thyromegaly.  CARDIAC:  Normal S1, S2, irregularly irregular rhythm with a  1/6  short  systolic ejection murmur heard best at the left upper sternal border.  LUNGS:  With right basilar crackles, otherwise clear.  SKIN:  Without rash.  ABDOMEN:  Soft, nontender with normoactive bowel sounds.  No  organomegaly.  EXTREMITIES:  Without clubbing, cyanosis or edema.  MUSCULOSKELETAL:  With exquisite tenderness to light palpation over his  bilateral metatarsophalangeal joints.  NEUROLOGIC:  He is alert and oriented x3.  Cranial nerves II-XII grossly  intact.  He is somewhat lethargic throughout the interview.   STUDIES:  Chest x-ray shows no acute airspace disease.  EKG reveals a  fibrillation with heart rate 96, no acute ST-T wave changes.   LABORATORY DATA:  White count 7100, hemoglobin 9.4 down from 11.9,  hematocrit 27.5 down from 34.6, platelet count of 272,000.  Sodium 134,  potassium 3.2, BUN 11, creatinine 0.95, glucose 115, total bilirubin  0.8, alkaline phosphatase 53, AST 31, ALT 24, total protein 5.3, albumin  2.2, BNP 599.  CK #1, 19; #2, 21; CK-MB #1, 0.5; #2, 0.7, troponin I #1,  0.06; #2, 0.12.  PTT on DVT prophylaxis, Lovenox 39.  PT 13.1, INR 1.0,  calcium 7.7, magnesium 1.2, alcohol level less than 5.   IMPRESSION:  1. Atrial fibrillation with rapid ventricular rate - heart rate      improved on digoxin therapy.  2. Hypokalemia.  3. Hypomagnesemia.  4. Minimally elevated troponin I.  5. Orthostatic hypotension  6. Malnutrition.  7. Alcoholism.  8. History of hypertension.  9. Macrocytic anemia.  10.Recent viral gastroenteritis.  11.Family history of coronary disease.   PLAN:  The patient presents with new onset atrial fibrillation in the  setting of metabolic derangement and orthostatic hypotension in the  setting of alcoholism and recent GI illness.  I suspect most of these  findings are secondary to dehydration.  He does have elevated BNP  without evidence of congestive heart failure on exam or chest x-ray.  His troponin-I is minimally  elevated.  His echocardiogram has been  reviewed by Dr. Dietrich Pates.  There are no significant abnormalities.  He  has normal LV function, mild LVH.  He will likely convert spontaneously  to sinus rhythm.  Due to his comorbidities, we will avoid full  anticoagulation for now.  His Lovenox has been stopped secondary to his  anemia and gastroenterology will be asked to see the patient.  Anemia  workup is being pursued by his primary service.  Folate will be added  and B12 will be checked.  His rate is fairly well-controlled at this  time with digoxin.  He may have residual beta blocker in his system or  there may be a component sick sinus syndrome.  Would recommend  discontinuing his allopurinol and colchicine until etiology of his  anemia is determined.  We will initiate aspirin if and when it is  suitable in light of his anemia workup.  Thank you very much for this  consultation.  Will be happy to follow the patient with you throughout  this admission.      Tereso Newcomer, PA-C      Gerrit Friends. Dietrich Pates, MD, [facility]  Electronically Signed    SW/MEDQ  D:  07/05/2007  T:  07/05/2007  Job:  [id number]   cc:   Dorris Singh  Fax: [phone]   Madelin Rear. Sherwood Gambler, MD  Fax: [phone]

## 2010-11-09 NOTE — Discharge Summary (Signed)
NAMEHRISHIKESH, HOEG              ACCOUNT NO.:  [id number]   MEDICAL RECORD NO.:  [id number]          PATIENT TYPE:  INP   LOCATION:  A312                          FACILITY:  [facility]   PHYSICIAN:  Lonia Blood, M.D.      DATE OF BIRTH:  January 09, 1951   DATE OF ADMISSION:  05/15/2008  DATE OF DISCHARGE:  11/23/2009LH                               DISCHARGE SUMMARY   PRIMARY CARE PHYSICIAN:  Madelin Rear. Sherwood Gambler, MD   DISCHARGE DIAGNOSES:  1. Lower gastrointestinal bleed secondary to coagulopathy.  2. Coumadin coagulopathy with INR more than 10.  3. Acute gout attack, now resolved.  4. Atrial fibrillation.  5. Gastroesophageal reflux disease.  6. Thrombocytopenia.  7. Alcoholism.  8. Hypokalemia.  9. Acute febrile illness.  10.Hypertension.   DISCHARGE MEDICATIONS:  1. Metoprolol 50 mg daily extended release.  2. Colchicine 0.6 mg daily.  3. Allopurinol 300 mg daily.  4. Prednisone as needed for gout.  5. Omeprazole 20 mg daily.  6. Potassium chloride daily.  7. Hydrochlorothiazide 25 mg daily.  8. Simvastatin 10 mg daily.   The patient has been instructed to stop taking ibuprofen and his  Coumadin until he is seen by Dr. Sherwood Gambler.   PROCEDURES PERFORMED:  X-ray of the right knee two views on May 17, 2008, that showed right knee effusion and anterior patella soft tissue  swelling.  Chest x-ray on May 18, 2008, that showed cardiomegaly  and vascular congestion.  EGD and colonoscopy performed on May 17, 2008, by Dr. Kassie Mends that showed rectal bleeding in the setting of  hypercoagulable state and Schatzki ring, but colon was said to be  normal.  There were moderate internal hemorrhoids.   CONSULTATION:  1. Kassie Mends, MD, Gastroenterology.  2. Vickki Hearing, MD, Orthopedics.   BRIEF HISTORY AND PHYSICAL:  Please refer to dictated history and  physical by Dr. Osvaldo Shipper.  In short; however, the patient is a 60-  year-old gentleman with history of  atrial fibrillation that was  diagnosed in January 2009.  The patient has been on Coumadin and doing  okay until the day of admission when he came in with severe rectal  bleeding.  The patient was seen in the ER and bleeding was confirmed.  His INR at that time was elevated at more than 10.  He was also  hypokalemic and anemic at the time of admission.  The patient was  subsequently admitted for further management.   HOSPITAL COURSE:  1. Rectal bleed.  The patient was admitted to intensive care unit.  He      received a number of units of fresh frozen plasma and vitamin K.      His INR was reversed for more than 10 to less than 1.5 within 24      hours.  He later had EGD and colonoscopy.  Meanwhile, he was typed,      cross matched, and given 3 units of packed red blood cells.  His      hemoglobin has dropped from the bleeding and since then he has been  stable.  His hemoglobin has remained stable around 10 at this      point.  He will have a follow up CBC to make sure that the blood      count remained stable.  In the meantime, the patient has been taken      off Coumadin, as well as his ibuprofen that he takes occasionally.  2. Coagulopathy.  This is secondary to Coumadin, not sure what      triggered it, but the patient is obviously not having good event      with Coumadin.  We will let his primary care physician make a      decision whether the patient is a good candidate to continue      Coumadin therapy.  3. Gout.  The patient had severe inflammation of his right knee two      days into admission.  It was not clear whether that was hemorrhagic      or septic or his gout.  He was treated for gout.  Ortho was      consulted and his knee was evaluated.  It was later determined that      this is probably from his own gout.  With this in mind, no further      workup was thought to be warranted and he subsequently received      treatment with both doses of steroids and his  colchicine, and pain      controlled.  The patient is now doing much better.  His knee has      improved tremendously.  4. Febrile illness.  Again, no exact cause was found, but presumed to      be secondary to his swollen knee.  The patient was initiated on      empiric antibiotics for possible septic arthritis.  This is being      discontinued now as there is no evidence of septic arthritis.  5. Debilitation.  The patient has poor gait probably from his swollen      knee.  He has received PT and OT consult.  At this point, there is      no requirement for him to get PT at home.  6. Thrombocytopenia.  This is most likely from his alcoholism.      Platelet is stable in the 60s.  7. Hypokalemia.  Again, this is transient and was repleted.  8. Atrial fibrillation.  His rate has been controlled, but currently      off Coumadin.  Otherwise, the patient is stable for discharge and      will proceed with discharge.      Lonia Blood, M.D.  Electronically Signed     LG/MEDQ  D:  05/19/2008  T:  05/20/2008  Job:  [id number]

## 2010-11-09 NOTE — Procedures (Signed)
NAMESIMONE, TUCKEY NO.:  [id number]   MEDICAL RECORD NO.:  [id number]          PATIENT TYPE:  INP   LOCATION:  IC09                          FACILITY:  [facility]   PHYSICIAN:  Gerrit Friends. Dietrich Pates, MD, FACCDATE OF BIRTH:  1950/10/13   DATE OF PROCEDURE:  07/04/2007  DATE OF DISCHARGE:                                ECHOCARDIOGRAM   CLINICAL DATA:  A 60 year old gentleman with atrial fibrillation.   M-MODE:  Aorta 2.4, left atrium 4.0, septum 1.4, posterior wall 1.2, LV  diastole 3.3, LV systole 2.5.   1. Technically adequate echocardiographic study.  2. Mild left atrial enlargement; normal right atrial size.  3. The right ventricle is prominent but not clearly enlarged; no RVH;      normal RV systolic function.  4. Trileaflet and normal aortic valve.  Normal proximal ascending      aorta.  5. Normal mitral valve; very mild regurgitation.  6. Normal tricuspid valve; physiologic regurgitation.  7. Normal pulmonic valve and proximal pulmonary artery.  8. Normal left ventricular size; mild hypertrophy; hyperdynamic      regional and global function.  9. Normal IVC.  10.Minimal posterior pericardial effusion.      Gerrit Friends. Dietrich Pates, MD, [facility]  Electronically Signed     RMR/MEDQ  D:  07/04/2007  T:  07/04/2007  Job:  [id number]

## 2010-11-09 NOTE — Op Note (Signed)
NAME:  Todd [name], BANKO NO.:  [id number]   MEDICAL RECORD NO.:  [id number]          PATIENT TYPE:  INP   LOCATION:  A220                          FACILITY:  [facility]   PHYSICIAN:  Kassie Mends, M.D.      DATE OF BIRTH:  09-18-1950   DATE OF PROCEDURE:  07/07/2007  DATE OF DISCHARGE:                               OPERATIVE REPORT   REFERRING PHYSICIAN:  Dr. Dorris Singh.   PRIMARY CARE PHYSICIAN:  Kirk Ruths, M.D.   PROCEDURE:  Esophagogastroduodenoscopy with cold forceps biopsy and  esophageal brushing/KOH prep   INDICATIONS FOR EXAM:  [name] is a 60 year old male who presented  with a macrocytic anemia and heme-positive stools.  His B12 level and  his folate level were normal.  His ferritin was not measured.  He  recently had a colonoscopy in 2007.  He has a significant history of  alcohol abuse.   FINDINGS:  1. White plaque seen in the esophagus beginning in the upper esophagus      and extending to the GE junction.  No erosion, Barrett's, or ulcers      seen.  Brushings obtained to evaluate for Candida esophagitis.  2. Diffuse erythema in the body and the antrum of the stomach without      ulceration.  Occasional erosion.  Biopsies obtained via cold      forceps to evaluate for H. pylori gastritis.  3. Normal duodenal bulb and second portion of the duodenum.   DIAGNOSIS:  [name]' heme-positive stools are likely secondary to  esophagitis and/or gastritis.   RECOMMENDATIONS:  1. Advance diet.  2. Await biopsies and brushings.  3. He should avoid alcohol.  No aspirin or NSAIDs for two weeks.  No      anticoagulation for five days.  4. Continue PPI on discharge.  5. Follow up with Dr. Kassie Mends in 4-6 weeks.   MEDICATIONS:  1. Demerol 100 mg IV.  2. Versed 5 mg IV.   PROCEDURE/TECHNIQUE:  Physical exam was performed, and informed consent  was obtained per the patient after explaining the risks, benefits and  alternatives to the  procedure.  The patient was connected to the monitor  and placed in the left lateral position.  Continuous oxygen was provided  by nasal cannula, and IV medicine administered through an indwelling  cannula.  After administration of sedation, the patient's esophagus was  intubated, and the scope was advanced under direct visualization through  the second portion of the duodenum.  The scope was withdrawn slowly by  carefully examining the color, texture, anatomy, and integrity of the  mucosa on the way out.  The patient was recovering in the endoscopy  suite and discharged to the floor in satisfactory condition.   ADDENDUM:  KOH prep: + yeast. D/C on Diflucan. Gastric biopsies: - H.  pylori, gastritis. Avoid EtOH. Continue PPI.      Kassie Mends, M.D.  Electronically Signed     SM/MEDQ  D:  07/07/2007  T:  07/07/2007  Job:  [id number]   cc:   Kirk Ruths, M.D.  Fax: [phone]

## 2010-11-09 NOTE — Consult Note (Signed)
Todd [name], Todd [name]              ACCOUNT NO.:  [id number]   MEDICAL RECORD NO.:  [id number]          PATIENT TYPE:  INP   LOCATION:  IC06                          FACILITY:  [facility]   PHYSICIAN:  Kassie Mends, M.D.      DATE OF BIRTH:  March 13, 1951   DATE OF CONSULTATION:  05/16/2008  DATE OF DISCHARGE:                                 CONSULTATION   GASTROENTEROLOGY CONSULTATION   REASON FOR CONSULTATION:  Lower GI bleed.   PRIMARY CARE PHYSICIAN:  Kirk Ruths, M.D.   HISTORY OF PRESENT ILLNESS:  The patient is a 60 year old Caucasian  gentleman with history of heavy alcohol abuse in the past who we saw  initially in January when he presented with anemia and Hemoccult  positive stool.  He quit drinking alcohol for about 10 months.  He tells  me recently, due to family stress related to taking care of his ill  mother, he did resume alcohol [place] for the past couple of weeks.  He has  also been on Coumadin for atrial fibrillation which was diagnosed back  in January of 2009.  He admittedly has consumed about 2 gallons of  liquor in the past couple of weeks.  He states yesterday he began having  large volume bright red blood per rectum with blood clots. He had about  4 or 5 episodes prior to coming to the emergency department.  He denies  any melena, abdominal pain, heartburn, nausea, vomiting, dysphagia,  odynophagia, constipation, diarrhea.  He denies any chest pain or  shortness of breath.  He has had some dizziness and weakness related to  the bleeding.   Upon evaluation, he was found to have a hemoglobin of 11.6.  His INR was  greater than 9.8.  His PTT was greater than 200. His white count was  17,500.  This morning, his hemoglobin has dropped down to 8.2.  He did  receive two units of FFP and 10 mg of vitamin K.  His INR this morning  is 1.5.  He is receiving his first unit of packed red blood cells at  this time.   MEDICATIONS AT HOME:  1. Metoprolol 50 mg daily.  2.  Colchicine 0.6 mg daily.  3. Allopurinol 300 mg daily.  4. Prednisone p.r.n.  5. Coumadin 5 mg daily.  6. Omeprazole 20 mg daily.  7. Potassium chloride daily.  8. Hydrochlorothiazide 25 mg daily.  9. Simvastatin 10 mg daily.  10.Ibuprofen, he states he rarely takes and was provided a      prescription by his rheumatologist.   ALLERGIES:  No known drug allergies.   PAST MEDICAL HISTORY:  1. History of heavy alcohol abuse, quit in January of 2009 but resumed      about two weeks ago as outlined above.  He would not tell me how      many years he was a heavy drinker.  2. History of atrial fibrillation diagnosed in January of 2009.  Has      been on Coumadin since that time.  3. History of gout.  4. Hypertension.  5.  History of heart murmur with reported normal echocardiogram.  6. Back surgery in the 80s.  7. Knee arthroscopy.  8. Colonoscopy December of 2007 by Dr. Karilyn Cota, reported to be normal.      Questionable history of adenomatous polyp on prior colonoscopy      before that.  9. EGD by Dr. Cira Servant January, 2009 revealed Candida esophagitis and      possible NSAID-induced gastritis.   FAMILY HISTORY:  Negative for chronic GI illnesses, colorectal cancer.   SOCIAL HISTORY:  He is married.  He lives with his wife, his daughter,  son-in-law and three children, ages 2, 2 and 67.  He has been staying  nights with his mother who had a severe concussion and Alzheimer's and  has been doing that for about three weeks.  He is employed at  [employer].  He has a 45-pack-year history of tobacco  use.  He states he has cut back from two packs per day to one-half pack  per day recently.  He states he has been drinking about a half-a-gallon  or more a week and has consumed about two gallons in the past two to  three weeks.  He denies any drug use.   REVIEW OF SYSTEMS:  See HPI for GI.  CONSTITUTIONAL:  No weight loss.  CARDIOPULMONARY:  See HPI.  GENITOURINARY:  No  dysuria, hematuria.   PHYSICAL EXAMINATION:  VITAL SIGNS:  Temperature 98.7, pulse 110,  respirations 16, blood pressure 129/65.  GENERAL:  Pleasant, pale  Caucasian gentleman in no acute distress.SKIN:  Warm and dry, no  jaundice.  HEENT:  Sclerae nonicteric. Oropharyngeal moist and pink.  No lesions,  erythema or exudate.  No lymphadenopathy, thyromegaly.CHEST:  Lungs are  clear to auscultation.CARDIAC:  Exam reveals slight tachycardia, rate in  the 110s, regular rhythm.  Normal S1, S2.  No murmurs, rubs or  gallops.ABDOMEN:  Positive bowel sounds.  Abdomen is flat, soft,  nontender, nondistended, no organomegaly or masses.  No rebound or  guarding.  No abdominal bruits or hernias.  LOWER EXTREMITIES:  No edema.   LABORATORY DATA:  As outlined above.  In addition, his total bilirubin  was 1.2, alkaline phosphatase 81, AST 25, ALT 13, albumin 3, white count  this morning 11,700, platelet count 154,000 down to 107,000, MCV 90.7.  BUN 15, creatinine 1.05, glucose 87.  BUN on admission was normal also  at 17.  No x-rays available.   IMPRESSION:  The patient is a 60 year old gentleman with large volume  hematochezia in the setting of supratherapeutic INR on Coumadin for  atrial fibrillation, recent heavy alcohol abuse with history of prior  chronic heavy alcohol abuse.  He has been hemodynamically stable.  He  has had no further bleeding since admission.  His INR has been corrected  with fresh frozen plasma and vitamin K.  Differential diagnoses at this  point include lower gastrointestinal bleed, possibly secondary to  diverticular bleed, arteriovenous malformation or polyp, cannot exclude  a small bowel source or rapid transit upper gastrointestinal bleed at  this point but would feel it would be less likely, given recent  esophagogastroduodenoscopy this year and no evidence of prior esophageal  varices or really significant gastrointestinal pathology.  I have  discussed this case  with Dr. Cira Servant and the plan is as outlined below.   RECOMMENDATIONS:  1. Will continue intravenous Protonix.  2. Transfuse as needed.  3. Will prep him for colonoscopy and possible upper endoscopy to be  done for tomorrow.  4. Further recommendations to follow.      Tana Coast, P.A.      Kassie Mends, M.D.  Electronically Signed    LL/MEDQ  D:  05/16/2008  T:  05/16/2008  Job:  [id number]   cc:   Kirk Ruths, M.D.  Fax: [phone]

## 2010-11-09 NOTE — Consult Note (Signed)
NAME:  Todd [name], Todd [name] NO.:  [id number]   MEDICAL RECORD NO.:  [id number]          PATIENT TYPE:  INP   LOCATION:  A220                          FACILITY:  [facility]   PHYSICIAN:  Kassie Mends, M.D.      DATE OF BIRTH:  1951/05/02   DATE OF CONSULTATION:  DATE OF DISCHARGE:                                 CONSULTATION   CONTINUATION:   HISTORY OF PRESENT ILLNESS:  He was also found to have significant  hypokalemia; he was started on p.o. potassium, outpatient, at about the  time that his nausea and vomiting began.  He had generalized abdominal  pain which was brought on after multiple episodes of heaving.  He denies  any hematemesis, denies any heartburn or indigestion, denies any  dysphagia or odynophagia.  He has lost 20 pounds in the last 6 weeks.  He consumes about half a gallon of alcohol a week and has for the last  10 years.  He has been able to eat breakfast this morning; he had chips  and sodas last night and again this morning.  He has not had any alcohol  in the last week.  He denies any rectal bleeding or melena.  He denies  any anorexia or early satiety.   He has been on Solu-Cortef since he was admitted; he was started on  Lovenox, but this has been held, as had his aspirin.  He has been on  Ativan protocol for alcohol withdrawal and he has had some magnesium  sulfate and potassium replacement.  He is on a multivitamin and  thiamine.   PAST MEDICAL AND SURGICAL HISTORY:  1. New-onset atrial fibrillation with RVR, being followed by Dr.      Dietrich Pates.  2. He has history of heart murmur.  3. He had back surgery in the 1980s.  4. He had arthroscopic knee surgery.  5. He has a history of alcohol abuse.  6. He has a history of hypertension.  7. Gout.  8. He has had 2 colonoscopies; the last colonoscopy was in 2007 by Dr.      Karilyn Cota; he had a normal exam.  There is possible mention of a      previous adenomatous polyp removed on previous colonoscopy.  9. He had a food impaction and had an EGD many years ago.   MEDICATIONS PRIOR TO ADMISSION:  1. Metoprolol ER 100 mg daily.  2. Colchicine 0.6 mg b.i.d.  3. Ibuprofen 800 mg p.r.n., usually 2 times a month.  4. Allopurinol 300 mg daily.  5. Prednisone 10 mg b.i.d.  6. Hydrochlorothiazide 12.5 mg daily.   ALLERGIES:  No known drug allergies.   FAMILY HISTORY:  There is no known family history of colorectal  carcinoma or other chronic GI problems.   SOCIAL HISTORY:  He lives with his wife and his daughter, son-in-law and  3 grandchildren reside with him.  He has 2 healthy sons.  he is employed  as a Media planner.  He has a 45-pack-year history of  tobacco use.  He denies any drug use.   REVIEW  OF SYSTEMS:  See HPI.  CARDIOVASCULAR:  Denies any chest pain at  this time.  PULMONARY:  Denies any shortness of breath, cough or  dyspnea.   PHYSICAL EXAMINATION:  VITAL SIGNS:  Weight is 62.8 kg, height is 69  inches, temperature 97.3, pulse 95, respirations 20 and blood pressure  140/89, O2 saturation 97% on room air.  GENERAL:  Todd [name] is a well-developed, well-nourished Caucasian male  in no acute distress.  HEENT:  TMs clear.  Sclerae anicteric.  Conjunctivae pink.  Oropharynx  pink and moist without any lesions.  NECK:  Supple without any mass or thyromegaly.  CHEST:  Heart regular is irregular with a 2/6 murmur noted.  LUNGS:  Decreased breath sounds bilaterally, no adventitious changes, no  acute distress.  ABDOMEN:  Positive bowel sounds x4.  No bruits auscultated.  Soft,  nontender and non-distended without palpable mass or hepatosplenomegaly.  No rebound tenderness or guarding.  EXTREMITIES:  Without clubbing or edema.  His left 3rd digit with gouty  tophi.   LABORATORY STUDIES:  WBC is 9.4, platelets 350,000.  INR 1 yesterday.  Calcium 7.7, sodium 136, potassium 3.3, chloride 105, CO2 23, BUN 10,  creatinine 0.88 and glucose 125, total bilirubin 0.8,  alkaline  phosphatase 53, AST 31, ALT 24, total protein 5.3, albumin 2.2.  Iron  22, TIBC 185, percent saturation 13, UIBC 143.  TSH is normal.  Magnesium is 1.7 today.  BNP has gone from 599 to 1260.   IMPRESSION:  Todd [name] is a 60 year old Caucasian male with a recent  drop in hemoglobin.  He has a macrocytic anemia with low iron indices  and ferritin unknown.  He has been admitted with hypotension,  hypokalemia and dizzy spells, found to have atrial fibrillation with  rapid ventricular response, being followed by Cardiology.  Given his  recent steroid use, allopurinol and colchicine, along with Lovenox since  hospitalized, he is at risk for gastrointestinal bleeding and I suspect  he may have bleed from either peptic ulcer disease or small bowel  etiology.  He did have recent nausea and vomiting, which had subsided a  couple of days ago, but was self-limited to 1 week.  He denies any other  gastrointestinal complaints at this time.   PLAN:  1. EGD with Dr. Cira Servant today.  I have discussed the risks and benefits      including, but not limited to, bleeding, infection, perforation,      drug reaction and he agrees with the plan and consent will be      obtained.  2. Alcohol cessation.  3. Agree with proton pump inhibitor.  4. We will make him n.p.o. for procedure today.   We would like to thank the [facility] for allowing [place] to  participate in the care of Todd [name].      Lorenza Burton, N.P.      Kassie Mends, M.D.  Electronically Signed    KJ/MEDQ  D:  07/05/2007  T:  07/05/2007  Job:  [id number]   cc:   Kirk Ruths, M.D.  Fax: [phone]

## 2010-11-09 NOTE — H&P (Signed)
NAMERASHIDI, LOH NO.:  [id number]   MEDICAL RECORD NO.:  [id number]          PATIENT TYPE:  INP   LOCATION:  IC09                          FACILITY:  [facility]   PHYSICIAN:  Osvaldo Shipper, MD     DATE OF BIRTH:  03/29/1951   DATE OF ADMISSION:  07/03/2007  DATE OF DISCHARGE:  LH                              HISTORY & PHYSICAL   PRIMARY CARE PHYSICIAN:  Dr. Madelin Rear. Fusco.   ADMISSION DIAGNOSES:  1. New onset atrial fibrillation.  2. History of alcoholism.  3. History of hypertension.  4. History of gout.   CHIEF COMPLAINT:  Dizziness for the past 4 weeks on and off.   HISTORY OF PRESENT ILLNESS:  The patient is a 60 year old Caucasian male  who has a history of tobacco and alcohol abuse apart from hypertension  and gout, who has been having symptoms of dizziness and light headedness  for the past 4 weeks.  He never had any syncopal episode.  Never had any  chest pain or palpitations.  He was in the ED on June 12, 2007 with  somewhat of a similar complaint at that time.  Patient mentions that he  has been having nausea, vomiting and diarrhea on and off for the past 4  weeks, last episode was 1 week ago.  This morning when he woke up he was  feeling fine but then suddenly he started becoming dizzy and light  headed, especially when he was getting up from a sitting or lying  position.  He denied passing out because of these symptoms.  Once again,  no chest pain, palpitations, shortness of breath, or any other symptoms  were present.  Patient spoke to his PMD who asked him to come in to the  ED as he was looking dehydrated.   MEDICATIONS AT HOME:  Unfortunately patient does not remember the doses  of his medications but he is on:  1. Metoprolol.  2. Colchicine.  3. Allopurinol.  4. He takes predinsone on and off and his last dose was today.  5. He is also on a diuretic but he does not know the name.   ALLERGIES:  No known drug allergies.   PAST  MEDICAL HISTORY:  1. Positive for gout.  2. Hypertension.  3. Alcoholism.  4. Tobacco abuse.   SURGICAL HISTORY:  He has had back surgeries and knee surgery in the  past.   SOCIAL HISTORY:  He lives in [place] with his wife.  He works in a  Proofreader.  He smokes two packs of cigarettes a day and  has an 80 pack year history of smoking.  He drinks 1-2 gallons of liquor  on a daily basis, also consumes a lot of beer.  His last drink he  mentioned was Friday.  No illicit drug use that he admits to.   FAMILY HISTORY:  Positive for heart disease and diabetes.  He is not  able to specify any further.   REVIEW OF SYSTEMS:  HEENT:  Unremarkable.  CARDIOVASCULAR:  Unremarkable  except as in HPI. RESPIRATORY:  As in HPI.  GI:  Unremarkable except for  the symptoms of gastroenteritis in the last few weeks.  GU:  Unremarkable.  NEUROLOGICAL:  Unremarkable.  PSYCHIATRIC:  Unremarkable.  DERMATOLOGIC:  Unremarkable.  Other symptoms unremarkable at this time.   PHYSICAL EXAMINATION:  VITAL SIGNS:  Temperature 98.6, blood pressure  upon standing was 62/49, lying down is about 106/76, heart rate  fluctuating between 100-230's at times.  His blood pressure last reading  was 98/75, he is completely asymptomatic. Respiratory rate is 16,  saturation 99% on room air.  GENERAL EXAM:  He is a thin, white male in no distress.  HEENT:  There is no pallor, no icterus. Oral mucous membranes moist, no  oral lesions are noted.  NECK:  Soft and supple, no thyromegaly is appreciated.  LUNGS:  Clear to auscultation bilaterally, a few rales identified at the  base.  ABDOMEN:  Soft, nontender, nondistended, bowel sounds are present, no  mass or organomegaly is appreciated.  EXTREMITIES:  Show evidence for gouty inflammation in both feet and the  first metatarsal phalangeal joint bilaterally. Erythema is noted.  NEUROLOGIC:  The patient is alert and oriented x3, no focal neurological  deficits  are present.   LABORATORY DATA:  CBC shows a hemoglobin of 11.9 with normal white  count, MCV is 102, platelet count is 289.  His B-MET shows a sodium of  128, potassium of 2.2, chloride of 83, bicarb 31, glucose 131, BUN 15,  creatinine 1.13, total bilirubin 1.3, AST 41, other parameters are  negative. Alcohol level less than 5.   His EKG shows atrial fibrillation with a rate of 96, but a normal axis,  interval appeared to be in the normal range, no acute ST or T wave  changes appreciated.   No chest x-ray has been done which we will order.   ASSESSMENT:  This is a 60 year old white male with alcoholism,  hypertension, gout, who presents with symptoms of dizziness.  He has  evidence for orthostatic hypotension.  I think there are a few things  going on here.  1. He could be dehydrated as a result of his gastroenteritis symptoms.  2. His symptoms could also be secondary to the atrial fibrillation.  I      think the first etiology is more likely.  His atrial fibrillation      could have been triggered by his alcohol.  He could have had atrial      fibrillation for the last 4 weeks when his symptoms first started.      As mentioned, he was seen in the emergency department on December      16. He had an EKG done at that time and it was interpreted as sinus      rhythm, so atrial fibrillation was definitely in the last couple of      weeks or so.   PLAN:  1. Atrial fibrillation.  Since we do not know his exact onset, he is      not really a candidate for cardioversion at this time.  Will need      anticoagulation ultimately although I would defer to cardiology.  I      will consult [facility] to see this patient.  I will order      a 2D echocardiogram. I will give him some digoxin to try      controlling his heart rate because of his borderline blood      pressure.  I  am hesitant to use Cardizem or Metoprolol at this      time.  2. Orthostatic hypotension.  Will give him  fluids.  This is likely      because of dehydrated date from gastroenteritis.  His alcoholism is      probably playing a role here too.  3. Hypokalemia.  We will check magnesium level and we will      aggressively replace the potassium.  4. Alcoholism. Will put him on an CIWA Ativan protocol with p.o.      medications and thiamine.  5. Mild anemia which is stable actually, compared to previous levels.      He has macrocytosis likely from his alcoholism although will go      ahead and check a TSH as well.  6. He has gout for which his allopurinol will be continued and will      give him colchicine as long as he does not have diarrhea. He has      been taking prednisone so because of his hypotension I think we can      give him a little bit of stress dose steroids in the form of      hydrocortisone at least until his blood pressure improves.   He will be monitored in the ICU. This patient took 1 hour of critical  care time.  If the patient destabilizes during the stay here, cardiology  will be consulted to help [place] manage this issue.      Osvaldo Shipper, MD  Electronically Signed     GK/MEDQ  D:  07/03/2007  T:  07/03/2007  Job:  [id number]   cc:   Madelin Rear. Sherwood Gambler, MD  Fax: [phone]

## 2010-11-09 NOTE — Group Therapy Note (Signed)
NAME:  Todd [name], SPECHT NO.:  [id number]   MEDICAL RECORD NO.:  [id number]          PATIENT TYPE:  INP   LOCATION:  A220                          FACILITY:  [facility]   PHYSICIAN:  Dorris Singh, DO    DATE OF BIRTH:  [birth year]   DATE OF PROCEDURE:  07/05/2007  DATE OF DISCHARGE:                                 PROGRESS NOTE   The patient seen today in the room with family.  Apparently, he has been  stating fabrications, they were concerned.  Also, he has been a little  bit shaky.  Family reports that he has a chronic history of alcohol  abuse.  When patient first arrived he was placed on the Ativan protocol;  however, patient is presenting with statements that are not true, and  due to his tremors we will institute the Ativan protocol starting on day  1, today.  The patient also was scheduled for an EGD.  According to  nursing staff he did not eat; however, he mentioned to Dr. Cira Servant, the GI  doctor, that he did eat a full breakfast, so, at this point in time it  was determined that on the side of safety, patient's procedure was  canceled.   Currently, he is stable.  His heart rate is within normal range.  His temperature is 97.3, pulse  is 95, respirations 20, and blood pressure 140/89.  Generally, this is a 60 year old Caucasian male who is well developed  and well nourished.  He is in no acute distress.  His heart rate is regular rate and rhythm and no murmur noted.  LUNGS:  Clear to auscultation bilaterally, no rales, wheezes, or  rhonchi.  ABDOMEN:  Soft, nontender, nondistended.  EXTREMITIES:  Positive pulses, no edema, cyanosis, or ecchymosis.   His labs are as follows:  His potassium was low, it was 3.3.  Will go  ahead and replace that.  Everything else is within normal limits.  His  hemoglobin has been trending down, it is 8.6 today.  He is 25.6 for  hematocrit.  Will go ahead and consult GI if we have not, and we will  get him ready to be transfused if  needed.   ASSESSMENT AND PLAN:  1. Anemia, the patient is scheduled for esophagogastroduodenoscopy.      We await for testing which should be tomorrow.  2. Atrial fibrillation being monitored by [facility].      Currently, rate is controlled.  3. History of alcoholism.  I think that he is going into delirium      tremens.  Will go ahead and institute Ativan      protocol on day 1.  4. History of hypertension, will keep an eye on his current      medication.  5. Tobacco abuse, make sure the patient is on Nicoderm patch.      Dorris Singh, DO  Electronically Signed     CB/MEDQ  D:  07/05/2007  T:  07/05/2007  Job:  [id number]

## 2010-11-09 NOTE — Consult Note (Signed)
NAME:  GILMORE, LIST NO.:  [id number]   MEDICAL RECORD NO.:  [id number]          PATIENT TYPE:  INP   LOCATION:  A220                          FACILITY:  [facility]   PHYSICIAN:  Kassie Mends, M.D.      DATE OF BIRTH:  Dec 12, 1950   DATE OF CONSULTATION:  DATE OF DISCHARGE:                                 CONSULTATION   REQUESTING PHYSICIAN:  [facility]   PRIMARY CARE PHYSICIAN:  Kirk Ruths, M.D.   REASON FOR CONSULTATION:  Anemia, heme-occult positive stool.   HISTORY OF PRESENT ILLNESS:  Mr. Paparella is a 60 year old Caucasian male  with history of alcohol abuse.  He was admitted with dizziness and  orthostatic hypotension and hypokalemia he was found to have atrial  fibrillation with RVR.  He is being followed by cardiology.  Upon  admission on January 6, his hemoglobin was 11.9, has dropped to 8.6  today with a hematocrit of 25.6 and a MCV of 104.4.  He has been found  to be heme-occult positive.  He had a one-week history of persistent  nausea and vomiting that stopped about two days ago.  He has been having  intermittent, what he calls dizzy spells at work.  Recently, he has been  seen by Dr. Sherwood Gambler.  His anti-hypertensive were decreased.  He has also  been on prednisone, as well as Colchicine and Allopurinol for gout.   DICTATION ENDED AT THIS POINT      Lorenza Burton, N.P.      Kassie Mends, M.D.  Electronically Signed    KJ/MEDQ  D:  07/05/2007  T:  07/05/2007  Job:  [id number]   cc:   Kirk Ruths, M.D.  Fax: [phone]

## 2010-11-09 NOTE — Op Note (Signed)
Todd [name], Todd [name]              ACCOUNT NO.:  [id number]   MEDICAL RECORD NO.:  [id number]          PATIENT TYPE:  INP   LOCATION:  IC06                          FACILITY:  [facility]   PHYSICIAN:  Kassie Mends, M.D.      DATE OF BIRTH:  08-29-1950   DATE OF PROCEDURE:  05/17/2008  DATE OF DISCHARGE:                               OPERATIVE REPORT   REFERRING PHYSICIAN:  Dr. Mikeal Hawthorne.   PRIMARY CARE PHYSICIAN:  Madelin Rear. Sherwood Gambler, MD   PROCEDURE:  1. Colonoscopy.  2. Esophagogastroduodenoscopy.   INDICATION FOR EXAM:  Todd [name] is a 60 year old male who has a history  of alcohol abuse.  He was abstinent for 10 months and then started  drinking alcohol again over the last 2 months.  He was diagnosed with  atrial fibrillation and put on Coumadin.  When he came in his INR was  greater than 10.  He was complaining of rectal bleeding.  He never had  any black tarry stools.   FINDINGS:  1. Extremely tortuous colon.  The tortuosity of this colon did not      allow for the distal terminal ileum to be intubated.  Otherwise,      normal colon without evidence of polyps, mass, inflammatory      changes, diverticular AVMs.  2. Moderate internal hemorrhoids.  Otherwise normal retroflexed view      of the rectum.  3. Patent Schatzki's ring.  Otherwise no evidence of Barrett's mass,      erosions or ulcerations.  4. Normal stomach and duodenum.  5. No old blood or fresh blood seen in the stomach, small intestines      or colon.   DIAGNOSES:  1. Rectal bleeding in the setting of a hypercoagulable state likely      secondary to hemorrhoids.  2. Patent Schatzki's ring.   RECOMMENDATIONS:  1. Advance diet.  2. Consider risks versus benefits of anticoagulation if the patient      continues to drink alcohol.  3. Daily proton pump inhibitor.  4. Follow up with Dr. Cira Servant in 3 months.  5. Agree with continued workup of febrile state.   MEDICATIONS:  1. Demerol 125 mg IV.  2. Versed 7 mg IV.   PROCEDURE TECHNIQUE:  Physical exam was performed.  Informed consent was  obtained from the patient after explaining the benefits, risks and  alternatives to the procedure.  The patient was connected to the monitor  and placed in the left lateral position.  Continuous oxygen was provided  by nasal cannula and IV medicine administered through an indwelling  cannula.  After administration of sedation and rectal exam, the  patient's rectum was intubated and the scope was advanced under direct  visualization to the cecum.  The scope was removed followed by carefully  examining the color, texture, anatomy and integrity of the mucosa on the  way out.   After the colonoscopy, the patient's esophagus was intubated with a  diagnostic gastroscope and the scope was advanced under direct  visualization to the second portion of the duodenum.  The scope was  removed slowly by carefully examining the color, texture, anatomy and  integrity of the mucosa on the way out.  The patient was recovered in  endoscopy and discharged home in satisfactory condition.      Kassie Mends, M.D.  Electronically Signed     SM/MEDQ  D:  05/17/2008  T:  05/17/2008  Job:  [id number]   cc:   Madelin Rear. Sherwood Gambler, MD  Fax: [phone]

## 2010-11-09 NOTE — Group Therapy Note (Signed)
NAME:  QUILL, GRINDER NO.:  [id number]   MEDICAL RECORD NO.:  [id number]          PATIENT TYPE:  INP   LOCATION:  A220                          FACILITY:  [facility]   PHYSICIAN:  Dorris Singh, DO    DATE OF BIRTH:  10-Jul-1950   DATE OF PROCEDURE:  07/06/2007  DATE OF DISCHARGE:                                 PROGRESS NOTE   The patient was seen today. Did not have EGD done because his potassium  was low. Have replaced potassium as well as cardiology replacing his  potassium as well. Hopefully, he will be ready to go for his procedure.  The patient was also put on the Ativan protocol. He seems to have  settled down and is not as confused as he was yesterday. The plan is if  his EGD is negative, we would be able to discharge him after his EGD  tomorrow. Cardiology has signed off and will go ahead and just continue  to monitor him. They will come back on if needed.   Temperature 97.9, pulse 81, respirations 20, blood pressure 139/97.  GENERAL:  This is a 60 year old Caucasian male who is well-nourished,  well-developed in no acute distress.  HEART:  Regular rate and rhythm. No murmurs noted.  LUNGS:  Clear to auscultation bilaterally. No rales, wheezes or rhonchi.  ABDOMEN:  Soft, nontender, nondistended.  EXTREMITIES:  Positive pulses. No edema, cyanosis, or ecchymosis.   His white count is 99.6, hemoglobin 9.1, hematocrit 26.9 and platelet  count of 440. His sodium is 136, potassium 2.9, chloride 101, CO2 24,  glucose 83, BUN 6, and creatinine 0.87.   1. Anemia. The patient will be scheduled for EGD tomorrow.  2. Atrial fibrillation is controlled.  3. History of alcoholism. The patient had ACT team come see him last      night and give him options regarding treatment modality.  4. Hypokalemia. It has been replaced. We will recheck it this      afternoon and see where patient is and replace if he needs it      replaced again.   __________  possible treatment  centers that he can go to. Will continue  to monitor patient and change therapy as needed.      Dorris Singh, DO  Electronically Signed     CB/MEDQ  D:  07/06/2007  T:  07/06/2007  Job:  [id number]

## 2010-11-09 NOTE — Assessment & Plan Note (Signed)
NAME:  Todd [name], Todd [name]               CHART#:  [id number]   DATE:  08/15/2007                       DOB:  [birth year]   REFERRING PHYSICIAN:  Dr. Sherwood [name].   PROBLEM LIST:  1. Atrial fibrillation requiring Coumadin therapy.  2. History of alcohol abuse.  3. Candida esophagitis.  4. Alcoholic gastritis.  5. Congestive heart failure.   SUBJECTIVE:  Todd [name] is a 60 year old male who had colonoscopy in  2007.  He presented to the emergency department with macrocytic anemia  and heme-positive stools.  He was found to have candida esophagitis and  gastritis.  He was discharged on Protonix twice a day.  He reports that  his swallowing is good and eating is good.  He denies any heartburn or  indigestion.  He has had no blood in his stool, or black stool that  looks like tar.   MEDICATIONS:  1. Protonix 40 mg twice daily.  2. Metoprolol.  3. Colchicine.  4. Allopurinol.  5. Prednisone as needed.  6. Hydrochlorothiazide.  7. Digoxin.  8. Potassium chloride.  9. Ativan.  10.Thiamin.  11.Folic acid.  12.Coumadin.   OBJECTIVE:   PHYSICAL EXAM:  Weight 137 pounds, height 5 feet 8 inches, temperature  98.3, blood pressure 102/70, pulse 72.  GENERAL:  He is in no apparent distress.  Alert and oriented x4.  LUNGS:  Clear to auscultation bilaterally.  CARDIOVASCULAR:  Regular rate and  rhythm without murmur.  Normal S1 and S2.  ABDOMEN:  Bowel sounds  present, soft.  Nontender, nondistended.   ASSESSMENT:  Todd [name] is a 60 year old male who had candida esophagitis  and received full therapy.  His gastritis is likely improved due to his  PPI therapy and reduction in alcohol consumption.  Thank you for  allowing me to see Todd [name] in consultation.  My recommendations are  to follow.   RECOMMENDATIONS:  1. Will obtain labs drawn by Dr. Sherwood [name].  2. He may reduce his Protonix to once daily.  He is given a      prescription for 30 and refill x6.  3. He has a follow up appointment to see  me in 6 months.  Will call      Todd [name] if anything changes based on his recent lab draw.       Kassie Mends, M.D.  Electronically Signed     SM/MEDQ  D:  08/15/2007  T:  08/16/2007  Job:  [id number]   cc:   Todd Rear. Sherwood Gambler, MD
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
## 2010-11-09 NOTE — Discharge Summary (Signed)
NAME:  Todd [name], Todd [name] NO.:  [id number]   MEDICAL RECORD NO.:  [id number]          PATIENT TYPE:  INP   LOCATION:  A220                          FACILITY:  [facility]   PHYSICIAN:  Dorris Singh, DO    DATE OF BIRTH:  [birth year]   DATE OF ADMISSION:  07/03/2007  DATE OF DISCHARGE:  01/10/2009LH                               DISCHARGE SUMMARY   ADMISSION DIAGNOSES:  1. New onset atrial fibrillation.  2. History of alcoholism.  3. History of hypertension.  4. History of gout.   DISCHARGE DIAGNOSES:  1. Atrial fibrillation.  2. Esophagitis.  3. Alcohol abuse.  4. Elevated BNP without clinical congestive heart failure.   PRIMARY CARE PHYSICIAN:  Kirk Ruths, M.D.   [facility]   STUDIES PERFORMED:  1. Portable chest x-ray on July 03, 2007 showed no acute air space      process.  2. Two-view chest x-ray on July 05, 2007 demonstrated small      effusions and pulmonary venous hypertension, stable cardiac      enlargement.  3. He had an echocardiogram done on July 04, 2007 which showed      everything is within normal limits with a normal ventricular size      and hyperdynamic regional and global function.   HISTORY AND PHYSICAL:  His H&P was done by Dr. Rito Ehrlich.  To summarize,  this is a 60 year old man who has a history of tobacco abuse, alcohol  abuse, gout, and hypertension who was having symptoms of dizziness and  lightheadedness for 4 weeks.  He was diagnosed with new onset atrial  fibrillation and orthostatic hypotension, hypokalemia, alcoholism, mild  anemia.  He was then placed in the ICU and started on digoxin to try to  control his heart rate.  Also, he was given fluids because of his  dehydration.  Apparently he had  gastroenteritis prior to this, and he  was placed on Ativan protocol and Sustiva.  Also, they checked his  magnesium level and replaced it.  He also was placed on his home  medications for gout.  The  patient was then followed in the ICU.  He  continued to improve.  [facility] saw him and monitored his rate  control which was controlled on the current digoxin.  Also, the  patient's hemoglobin decreased, so GI was consulted.  Hemoccult stools  were positive, and the patient's lab work also showed that his BNP was  elevated without any clinical reason.  Metoprolol was then added also to  control his rate.  His serial H&H were obtained to monitor his anemia.  The patient's hemoglobin was 11, and then it dropped down to in the  range of 9.  He was continued to be monitored.  On July 05, 2007, the  patient started to become more confused, and when he was speaking he  would have can confabulations.  At that point in time, spoke with the  patient's family who states that he has bouts like this.  He had  finished the Ativan protocol, so we reinstituted it starting  on day one  again.  The patient seemed to improve within 24 hours.  Also, the  patient was supposed to have an EGD done, but on this day he had told  the gastroenterologist that he had eaten even though nursing staff  stated that he had not.  Based on this report, it was safer to hold the  procedure.  The next day, the patient was hypokalemia, even after  potassium supplementation, so he was then supplemented over 24 hours and  the procedure was done today.  The patient was diagnosed with gastritis  and esophagitis, and at this point in time, from cardiology's  standpoint, he was cleared to go home on July 06, 2007.  From a GI  standpoint, he was cleared to go home today, so we will go ahead and  discharge the patient with specific instructions.  From Dr. Cira Servant, he  has been told to advance his diet.  He needs to follow up for his  biopsies.  He needs to avoid all alcohol use.  He will do a PPI b.i.d.  He has had no aspirin products x2 weeks and no anticoagulation x5 days.  For [facility], still they are concerned  about his elevated BNP  not in the setting of congestive heart failure.  They would like  followup outpatient also to monitor his atrial fibrillation as well.  The patient was also seen by the ACT team on July 05, 2007 regarding  possible rehab options that he may have for his alcohol abuse.  He was  seen by the counselor and given these options if he was interested in  taking them.   DISCHARGE MEDICATIONS:  1. Metoprolol ER 100 mg once a day.  2. Colchicine 0.6 mg twice a day.  3. Allopurinol 300 mg once a day.  4. Prednisone 10 mg twice a day.  5. Hydrochlorothiazide 25 mg one-half tablet once a day.  6. Digoxin 0.125 mg p.o. daily.  7. KCL 20 mEq one p.o. daily.  8. Protonix 40 mg one p.o. b.i.d.  9. Ativan 1 mg 1 x8 hours x1 day, 1 x4 hours x1 day, 1 x24 hours x 1      day.  10.Thiamine 100 mg p.o. daily.  11.Folic acid 1 mg daily.   CONDITION ON DISCHARGE:  Stable.   DISPOSITION:  To home.   FOLLOW UP:  I would like him to follow up with [facility] in 1-3 days which  would be the beginning of next week so Dr. Regino Schultze can start his  anticoagulation therapy for his atrial fibrillation and then with Dr.  Cira Servant in 1-2 weeks for his biopsy report.      Dorris Singh, DO  Electronically Signed     CB/MEDQ  D:  07/07/2007  T:  07/08/2007  Job:  [id number]   cc:   Kirk Ruths, M.D.  Fax: [phone]   Kassie Mends, M.D.  [address]  [place] , [place] [address]

## 2010-11-09 NOTE — Assessment & Plan Note (Signed)
NAME:  Todd [name], Todd [name]               CHART#:  [id number]   DATE:  02/27/2008                       DOB:  13-Jul-1950   PROBLEM LIST:  1. Candida esophagitis.  2. Gout.  3. Hypertension.  4. Atrial fibrillation requiring anticoagulation.  5. History of alcohol abuse, last drink in December 2008.  6. Normocytic anemia in August 2009 with a hemoglobin of 12.1 and MCV      of 92.2 with mild peripheral eosinophilia of 0.8 (upper limit are      normal 0.7).   SUBJECTIVE:  The patient is a 60 year old male who presents as a return  patient visit.  He was last seen in February.  He has no particular  questions, concerns, or complaints.  He does not have any trouble with  his stomach now.  He only uses a Prilosec as needed.  The only time he  has pain in his stomach is if he smokes a whole a lot or eats spicy  food.  He is not taking any aspirin, just Coumadin.   MEDICATIONS:  1. Metoprolol.  2. Colchicine.  3. Allopurinol.  4. Hydrochlorothiazide.  5. Digoxin.  6. Potassium chloride.  7. Ativan as needed.  8. Folic acid.  9. Coumadin.  10.Prilosec as needed.  11.Cholesterol pill nightly.   OBJECTIVE:  Physical exam:  VITAL SIGNS:  Weight 154 pounds (up 17 pounds since February 2009),  height 5 feet 9 inches, temperature 98, blood pressure 120/72, and pulse  60.GENERAL:  He is in no apparent stress.  Alert and oriented x4.LUNGS:  Clear to auscultation bilaterally.CARDIOVASCULAR:  Regular  rhythm.ABDOMEN:  Bowel sounds are present.  Soft, nontender, and  nondistended.   ASSESSMENT:  The patient is a 60 year old male who has a normocytic  anemia, which is likely secondary to recovering bone marrow from  alcoholic suppression.  He has had an EGD and a colonoscopy, which  showed no significant source for anemia except for gastritis.  Thank you  for allowing me to see the patient in consultation.  My recommendations  follow.   RECOMMENDATIONS:  1. The patient will have labs drawn  next week and have them faxed to      the office.  2. He can continue the Prilosec if needed.  3. He has a follow up appointment to see me in 6 to 12 months.       Kassie Mends, M.D.  Electronically Signed     SM/MEDQ  D:  02/27/2008  T:  02/28/2008  Job:  [id number]   cc:   Madelin Rear. Sherwood Gambler, MD

## 2010-11-09 NOTE — Group Therapy Note (Signed)
NAME:  Todd [name], AWAN NO.:  [id number]   MEDICAL RECORD NO.:  [id number]          PATIENT TYPE:  INP   LOCATION:  IC09                          FACILITY:  [facility]   PHYSICIAN:  Dorris Singh, DO    DATE OF BIRTH:  Feb 13, 1951   DATE OF PROCEDURE:  DATE OF DISCHARGE:                                 PROGRESS NOTE   The patient is seen today resting in bed comfortably.  Currently he was  admitted with AVR.  Doing well.  The patient also had a drop in his  hemoglobin earlier today.  I have been following him throughout the day  with repeated H&Hs.  These remain stable.  Also he is on Ativan  protocol.  He has not needed Ativan only once today, but seems to be  doing well with this as well.  Patient has stabilized.  He is being seen  by [facility].  We will go ahead and transfer the patient to 2A  on telemetry and continue to monitor there.   His vital signs are as follows:  Heart rate is 86.  Blood pressure  154/82.  His respirations are 26.  Pulse oximetry 100%.  GENERAL:  This is a 60 year old Caucasian male who well developed, well  nourished and in no acute distress.  HEART:  Regular rate and rhythm.  No murmur noted.  LUNGS:  Clear to auscultation bilaterally.  No rales, wheezes or  rhonchi.  ABDOMEN:  Soft, nontender and nondistended.  EXTREMITIES:  Positive pulses.  No  edema or cyanosis.   Labs that were done today included a sodium of 134, potassium of 3.3,  chloride 101, CO2 24, glucose 119, BUN 12 and creatinine 0.87.  His  magnesium level was 1.2.  We will replace that today if not replaced.  He has had three H&Hs today:  One is 9.4, 9.0 and 9.7 for hemoglobin and  then 27.5, 27 and 28.7 for hematocrit.   ASSESSMENT/PLAN:  1. Anemia.  I will continue with H&Hs, hemoglobin and hematocrit.  We      will also get GI to consult.  2. Atrial fibrillation.  He is being monitored by [name].      Currently he is rate controlled.  3.  History of alcoholism.  Currently on Ativan protocol.  (a)  History of hypertension.  We will continue with his current  medication.  1. Tobacco abuse.  We will make sure the patient in on the patch.   PLAN:  Transfer him to step-down 2A for telemetry and continue to  monitor him there.      Dorris Singh, DO  Electronically Signed     CB/MEDQ  D:  07/04/2007  T:  07/04/2007  Job:  [id number]

## 2010-11-09 NOTE — H&P (Signed)
NAMEMarland [name]  WELCOME, FULTS NO.:  [id number]   MEDICAL RECORD NO.:  [id number]          PATIENT TYPE:  EMS   LOCATION:  ED                            FACILITY:  [facility]   PHYSICIAN:  Osvaldo Shipper, MD     DATE OF BIRTH:  [birth year]   DATE OF ADMISSION:  05/15/2008  DATE OF DISCHARGE:  LH                              HISTORY & PHYSICAL   PMD:  Kirk Ruths, M.D. with [facility].   CARDIOLOGIST:  [facility].   ADMISSION DIAGNOSES:  1. Hematochezia.  2. 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  3. Hypertension.  4. History of atrial fibrillation.  5. History of gout.   CHIEF COMPLAINT:  Blood in stool.   HISTORY OF PRESENT ILLNESS:  Patient is a 60 year old Caucasian male who  has a history of A fib diagnosed in January of this year, who was  started on Coumadin at that time, who was in his usual state of health  until this morning until he started passing blood per rectum.  He says  it was only blood, no stool.  He has been passing large quantities.  Did  feel lightheaded this morning but denied any syncopal episodes.  Denies  any abdominal pain.  He had an episode of vomiting with clear liquids,  no blood, just a few minutes ago.  His last episode of bleeding was  again a few minutes prior to this assessment.  He mentions that his  stool is now apart from blood, also showing some clots.  He has never  had this problem in the past.  He has not been taking any new  medications recently.   He admits to having a colonoscopy in the last couple of years, which was  normal.   HOME MEDICATIONS:  1. Metoprolol 50 mg once daily.  2. Colchicine 0.6 mg once daily.  3. Allopurinol 300 mg once daily.  4. Coumadin 5 mg once daily.  5. Omeprazole 20 mg once daily.  6. Potassium chloride 20 mEq once daily.  7. HCTZ 12.5 mg once daily.  8. Simvastatin 10 mg once daily.  9. Ibuprofen and prednisone as needed for gout.   PAST MEDICAL HISTORY:  1. A fib.  2. Gout.  3. Hypertension.   SURGICAL HISTORY:  1. Back surgery.  2. Knee surgery.   SOCIAL HISTORY:  Lives in [place] with his wife.  Smokes a pack of  cigarettes on a daily basis.  He said he had been quit from alcohol for  10 months and then 2 weeks ago, he started drinking again.  He drinks  hard liquor, about a half gallon bottle.  He has had 4 half-gallon  bottles in the last 2 weeks.  His last drink was last night.   FAMILY HISTORY:  Positive for diabetes, coronary artery disease, and  Alzheimer's disease.   REVIEW OF SYSTEMS:  GENERAL:  Positive for weakness.  HEENT:  Unremarkable.  CARDIOVASCULAR:  Unremarkable.  RESPIRATORY:  Unremarkable.  GI:  As in HPI.  GU:  Unremarkable.  NEUROLOGIC:  Unremarkable.  PSYCHIATRIC:  Unremarkable.  DERMATOLOGIC:  Unremarkable.  MUSCULOSKELETAL:  Unremarkable.  Other systems unremarkable.   PHYSICAL EXAMINATION:  VITAL SIGNS:  Temperature 98.1, blood pressure  114/67.  Heart rate 89.  Respiratory rate 20.  Saturation 100% on room  air.  GENERAL:  This is a well-developed and well-nourished white male in no  distress.  HEENT:  There is mild pallor.  No icterus.  Oral mucous membranes are  moist.  No oral lesions are noted.  NECK:  Soft and supple.  No thyromegaly is appreciated.  LUNGS:  Clear to auscultation bilaterally anteriorly.  CARDIOVASCULAR:  S1 and S2 is irregular.  No murmurs appreciated.  No S3  or S4.  No rubs.  No bruits.  ABDOMEN:  Soft, nontender, nondistended.  Bowel sounds are present.  No  masses or organomegaly is appreciated.  RECTAL:  Not done at this time.  NEUROLOGIC:  He is alert and oriented x3.  No focal neurological  deficits are present.  MUSCULOSKELETAL:  Unremarkable.   LABS:  White count 17,500 with normal differential.  Hemoglobin is 11.6,  platelet count 154.  His INR is greater than 9.8.  Potassium is 3.2.  Glucose 109.  Albumin is 3.4.  Calcium is 8.2.   No imaging studies have  been done.   He had an EKG which shows actually a sinus rhythm with a normal access.  Intervals appeared to be in the normal range.  T inversion noted in V1.  Otherwise no concerning findings.  Possible RBB is appreciated.   ASSESSMENT:  1. This is a 59 year old Caucasian male who presents with      hematochezia.  This is in the setting of a supratherapeutic INR.      His INR is supratherapeutic, likely because of the recent heavy      intake of alcohol in combination with Coumadin.  His bleeding is      likely because of the supratherapeutic INR.  He indeed had a normal      colonoscopy in December, 2007.  He also had an EGD done in January      of this year which showed evidence for esophagitis and gastritis.  2. The reason for his leukocytosis is not clear.   PLAN:  1. Lower GI bleeding:  The goal will be to reverse his INR at this      time.  Vitamin K IV has been given.  We will recheck his PT/INR      tomorrow.  PPIs will be given.  GI will be consulted in the a.m.      If the patient continues to bleed and becomes hemodynamically      unstable, we will contact a [facility] to transfer      this patient over there, as there is no local GI coverage in this      hospital tonight.  2. Anemia:  His blood count actually appears to be stable.  Mildly      hemo-concentrated.  We will do another H&H now and then check it      q.6h.  Blood will be transfused as needed to maintain a hemoglobin      above 8.  3. Supratherapeutic INR secondary to a combination of Coumadin and      alcohol:  Vitamin K has been given, and we will follow INRs on a      daily basis.  I have explained to the patient that he needs to      cease  from consuming alcohol, as Coumadin is important to him for      stroke prevention and alcohol as is happening now can interfere and      cause supratherapeutic INRs.  Patient understands, and I believe he      is contemplating quitting alcohol.  4.  Hypokalemia:  Will be repleted.  5. Hypertension:  I will hold off on his antihypertensive medications      for now.  6. Gout:  He has a history of gout, which is stable as well.  7. Leukocytosis: Monitor. He is afebrile. No antibiotics for now.   Sequential compression devices will be utilized for DVT prophylaxis for  now.  The patient will be closely monitored in the ICU.   Total critical care time on this encounter: 1 hour      Osvaldo Shipper, MD  Electronically Signed     GK/MEDQ  D:  05/15/2008  T:  05/16/2008  Job:  [id number]   cc:   Kirk Ruths, M.D.  Fax: [phone]   Kassie Mends, M.D.  [address]  [place] , [place] [address]

## 2010-11-12 NOTE — Discharge Summary (Signed)
NAME:  Todd [name], Todd [name]                        ACCOUNT NO.:  [id number]   MEDICAL RECORD NO.:  [id number]                   PATIENT TYPE:  INP   LOCATION:  A211                                 FACILITY:  [facility]   PHYSICIAN:  Kirk Ruths, M.D.            DATE OF BIRTH:  July 29, 1950   DATE OF ADMISSION:  09/16/2003  DATE OF DISCHARGE:  09/19/2003                                 DISCHARGE SUMMARY   DISCHARGE DIAGNOSES:  1. Alcohol withdrawal seizure.  2. Hypokalemia secondary to vomiting.  3. Cellulitis of the left upper extremity.  4  History of hypertension.  1. History of alcohol abuse.   HOSPITAL COURSE:  This 60 year old white male was admitted after having  generalized grand mal seizure.  The patient stated that he had been on binge  drinking for about a week or two; had stopped approximately 3 days before  the onset of this seizure.  The patient also noted that the same thing  happened to him last summer with cessation of alcohol.   Basically the patient's initial workup was negative.  CT scan essentially  negative.  Laboratories showed white count of 8900, hemoglobin 12.4.  Initial BUN was 9 with a creatinine of 3.2; this improved to 6 and 1.2 after  hydration.  Initial sodium was 3.0.   The patient was treated with IVs and p.o. potassium.  At the time of  discharge his potassium was 2.9 and he has had no vomiting since.  The  patient wants to go home even though he has a temperature of 101.  He denies  any GI complaints, cough, or congestion.  The patient does have some mild  inflammation of an abrasion on his left upper extremity with some mild  cellulitis and induration. He will be treated with Keflex as an outpatient  as well as K-Dur 20 mEq twice a day.   The patient also complained of some right shoulder pain during his stay.  X-  ray of that was negative.   The patient was seen by Dr. Gerilyn Pilgrim in consultation for neurology who  basically agreed with alcohol  withdrawal seizures.  EEG was scheduled, but  not performed while the patient was in the hospital; will just be arranged  as an outpatient.   The patient was discharged on K-Dur 20 mg b.i.d., Keflex 500 mg a day, Xanax  as needed until we stop alcohol withdrawal.  I will see the patient, next  week, in the office to evaluate his cellulitis and his potassium as well as  other problems.     ___________________________________________                                         Kirk Ruths, M.D.   WMM/MEDQ  D:  09/19/2003  T:  09/20/2003  Job:  [id number] 

## 2010-11-12 NOTE — H&P (Signed)
   NAME:  Todd [name], Todd [name] NO.:  [id number]   MEDICAL RECORD NO.:  [id number]                  PATIENT TYPE:   LOCATION:                                       FACILITY:   PHYSICIAN:  Dalia Heading, M.D.               DATE OF BIRTH:   DATE OF ADMISSION:  DATE OF DISCHARGE:                                HISTORY & PHYSICAL   CHIEF COMPLAINT:  Tophaceous gout, right elbow.   HISTORY OF PRESENT ILLNESS:  The patient is a 60 year old white male who was  referred for evaluation and treatment of gouty deposits in the right elbow.  He has had tophaceous gout removed from the left elbow in 2003.  The right  elbow is currently infected with flexion.   PAST MEDICAL HISTORY:  Includes:  1. Gout.  2. Hypertension.  3. Depression.   PAST SURGICAL HISTORY:  Tophaceous gout removal of left elbow in 2003.   CURRENT MEDICATIONS:  Toprol, Lexapro, Colchicine.   ALLERGIES:  No known drug allergies.   REVIEW OF SYSTEMS:  The patient does smoke daily.  He does drink alcohol on  occasion.  Denies any other cardiopulmonary difficulties or bleeding  disorders.   PHYSICAL EXAMINATION:  GENERAL:  The patient is a well-developed, well-  nourished, white male in no acute distress.  VITAL SIGNS:  He is afebrile and vital signs are stable.  LUNGS:  Clear to auscultation with equal breath sounds bilaterally.  HEART:  Reveals a regular rate and rhythm without S3, S4, or murmurs.  EXTREMITIES:  Reveals two areas of gouty deposits in the right elbow and  forearm.  Slight limitation of flexion in the right elbow is noted.   IMPRESSION:  Tophaceous gout, right elbow.    PLAN:  The patient was scheduled for excision of the gouty deposits, right  elbow on May 09, 2003.  The risks and benefits of the procedure  including bleeding, infection, and recurrence of the gouty deposits were  fully explained to the patient, gave informed consent.     ___________________________________________                                         Dalia Heading, M.D.   MAJ/MEDQ  D:  05/06/2003  T:  05/06/2003  Job:  [id number]   cc:   Patrica Duel, M.D.  [address], [address]  [place]  [place] [address]  Fax: [phone]   Aundra Dubin, M.D.

## 2010-11-12 NOTE — Consult Note (Signed)
NAME:  Todd [name], Todd [name]                        ACCOUNT NO.:  [id number]   MEDICAL RECORD NO.:  [id number]                   PATIENT TYPE:  INP   LOCATION:  A211                                 FACILITY:  [facility]   PHYSICIAN:  Kofi A. Gerilyn Pilgrim, M.D.              DATE OF BIRTH:  1950/08/05   DATE OF CONSULTATION:  09/17/2003  DATE OF DISCHARGE:                                   CONSULTATION   NEUROLOGIC CONSULTATION   REASON FOR CONSULTATION:  Seizure.   IMPRESSION:  Alcohol withdrawal seizure.  At this time we do not believe  that the patient has epilepsy.   RECOMMENDATIONS:  1. EEG.  2. Did discuss seizure precautions with the patient especially until we have     the final results of the EEG.  3. Encouraged the patient to abstain from consuming alcoholic beverages.  4. To continue with medicines for anxiety problems and thiamin.  5. Urine drug screen.   HISTORY:  The patient is a 60 year old Caucasian man who apparently was  taken to the hospital after having what appears to be a good generalized  tonic colonic seizure.  He apparently had a similar event approximately a  year ago after he abruptly quit consuming alcohol.  The event happened while  he was sleeping.  Approximately 3 days after he abruptly quit using alcohol,  he apparently had some shaking and quit breathing.  His wife reported that  he had blood in his mouth the following day, and also a laceration of the  tongue.  The event yesterday, again, happened 3 days after the patient went  on the alcohol binge.  He did have some withdrawal symptoms the first couple  of days afterwards.  His daughter is apparently in school to be in the  medical profession; and she apparently reports that the patient had fine  tremors the first 2 days. On the day in question the patient started having  shaking involving the left upper extremity, then involving the right upper  extremity, and all 4 extremities. He apparently fell; however,  this fall was  halted by his daughter who subsequently placed him on the floor.  Again, he  had his eyes roll back and did sustain oral trauma.  No urinary incontinence  is reported.  The patient did have amnesia for the event.  The patient was  taken to the emergency room where he was known to have a low potassium of  3.0.  A CT scan of the head was unremarkable.  He has had problems with  episodic nausea and vomiting over the past 2 weeks.  The patient complains  of severe pain involving the right shoulder which limits his range of  motion.   PAST MEDICAL HISTORY:  1. Anxiety disorder.  2. Depression.  3. Hypertension.  4. Gout.   SOCIAL HISTORY:  He is a smoker, works at [employer].   REVIEW  OF SYSTEMS:  No headaches are reported, no shortness of breath, no  abdominal pain. He had recently been placed on Lexapro and alprazolam  because of anxiety problems.   PHYSICAL EXAMINATION:  VITAL SIGNS:  He has had a T-max of 100.1 at 4 a.m.  today.  Currently temperature down to 97.2, pulse 78, respirations 20, blood  pressure 141/86.  NECK:  Supple.  LUNGS:  Clear to auscultation bilaterally.  CARDIOVASCULAR:  Exam reveals normal S1, S2.  ABDOMEN:  Soft.  EXTREMITIES:  Show arthritic changes involving the hands consistent with  gouty arthritis and also some osteoarthritic changes.  NEUROLOGIC:  The patient is awake, alert.  He converses fluently.  There are  no problems with comprehension, language, or dysarthria.  Cranial nerves II-  XII are intact including visual fields.  Motor examination shows 3/5  weakness of the deltoid muscles with severe limitation of the range of  motion due to severe pain. No obvious bruises are seen.  Other muscle groups  involving the right upper extremity shows normal tone, bulk, and strength.  Other extremities are normal in regard to bulk, tone, and strength.  Reflexes shows an absent ankle jerk on the right; otherwise, reflexes are   unremarkable.  __________ downgoing.  Coordination is intact.  Gait is wide-  based.  CT scan of the brain shows acute sinus problems involving the  superior left mastoid with fluid, otherwise unremarkable.   LABORATORY EVALUATION:  WBC 8.9, hemoglobin 12, platelet count of 88.  Sodium 136, potassium 3.0, chloride 91, CO2 32, glucose 116, BUN 9,  creatinine 2.2, calcium 8.6.   Thanks for this consultation.      ___________________________________________                                            Perlie Gold Gerilyn Pilgrim, M.D.   KAD/MEDQ  D:  09/17/2003  T:  09/18/2003  Job:  [id number]

## 2010-11-12 NOTE — Op Note (Signed)
NAME:  Todd [name], Todd [name]                        ACCOUNT NO.:  [id number]   MEDICAL RECORD NO.:  [id number]                   PATIENT TYPE:  AMB   LOCATION:  DAY                                  FACILITY:  [facility]   PHYSICIAN:  Dalia Heading, M.D.               DATE OF BIRTH:  01/29/1951   DATE OF PROCEDURE:  05/09/2003  DATE OF DISCHARGE:                                 OPERATIVE REPORT   PREOPERATIVE DIAGNOSIS:  Tophaceous gout, right elbow and forearm.   POSTOPERATIVE DIAGNOSIS:  Tophaceous gout, right elbow and forearm.   PROCEDURE:  Excision of tophaceous gout/olecranon bursa, right elbow and  right forearm.   SURGEON:  Dalia Heading, M.D.   ANESTHESIA:  Regional block.   INDICATIONS:  The patient is a 60 year old white male with a history of gout  who has had a previous excision of tophaceous gout of the left elbow who not  presents with a symptomatic tophaceous gout deposits in the right elbow  along the olecranon bursa as well as another smaller deposit in the proximal  right forearm.  The patient now comes to the operating room for excision of  both. The risks and benefits of the procedure including bleeding, infection,  pain, and recurrence of the tophaceous gout were fully explained to the  patient, who gave informed consent.   DESCRIPTION OF PROCEDURE:  The patient was placed in the supine position. A  regional block was performed on the right arm.  The right arm and elbow were  prepped and draped using the usual sterile technique with Betadine. Surgical  site confirmation was performed.   A longitudinal incision was made over the right olecranon bursa.  A partial  olecranon bursectomy was performed.  Multiple deposits of tophaceous gout  were debrided without difficulty.  The skin was then closed using 4-0 Nylon  vertical mattress sutures.  A smaller area approximately 2 cm in size was  noted along the right forearm.  This gouty deposit was also excised without  difficulty.  The wound was closed with 4-0 nylon vertical mattress sutures.  Betadine ointment and dry sterile dressing were applied.  An Ace wrap was  then applied.   All tape and needle counts were correct at the end of the procedure. The  tourniquet was turned down and the patient's right radial pulse was intact.   COMPLICATIONS:  None.    SPECIMEN:  Tophaceous gout, right elbow and right forearm.   BLOOD LOSS:  Minimal.      ___________________________________________                                            Dalia Heading, M.D.   MAJ/MEDQ  D:  05/09/2003  T:  05/09/2003  Job:  [id number]   cc:  Aundra Dubin, M.D.   Patrica Duel, M.D.  [address], [address]  [place]  [place] [address]  Fax: [phone]

## 2010-11-12 NOTE — Op Note (Signed)
NAME:  Todd [name], Todd [name]              ACCOUNT NO.:  [id number]   MEDICAL RECORD NO.:  [id number]          PATIENT TYPE:  AMB   LOCATION:  DAY                           FACILITY:  [facility]   PHYSICIAN:  Lionel December, M.D.    DATE OF BIRTH:  Jan 14, 1951   DATE OF PROCEDURE:  06/02/2006  DATE OF DISCHARGE:                               OPERATIVE REPORT   PROCEDURE:  Colonoscopy.   ENDOSCOPIST:  Lionel December, M.D.   INDICATIONS:  Todd [name] is a 60 year old Caucasian male with a history of  colonic polyp.  He had a 7-mm adenoma removed from his cecum 5 years  ago.  Family history is negative for colorectal carcinoma.  The  procedure and risks were reviewed with the patient and informed consent  was obtained.   MEDICATIONS FOR CONSCIOUS SEDATION:  Demerol 50 mg IV and Versed 10 mg  IV.   FINDINGS:  Procedure performed in endoscopy suite.  The patient's vital  signs and O2 saturation were monitored during the procedure and remained  stable.  The patient was placed in the left lateral recumbent position;  and rectal examination was performed.  No abnormality noted on external  or digital exam.   The Olympus videoscope was placed in the rectum and advanced under  vision into the sigmoid colon and beyond.  Preparation was satisfactory.  Cecum was identified by ileocecal valve which was not very conspicuous  as well as appendiceal orifice.  Pictures were taken for the record.  As  the scope was withdrawn the colonic mucosa was examined, for the second  time, and no mucosal abnormalities were noted.  Rectal mucosa,  similarly, was normal.   The scope was retroflexed to examine the anorectal junction which was  unremarkable. The endoscope was straightened and withdrawn.  The patient  tolerated the procedure well.   FINAL DIAGNOSIS:  Normal colonoscopy.   RECOMMENDATIONS:  1. Yearly Hemoccults.  2. He may consider waiting 7 years before his next procedure.      Lionel December, M.D.  Electronically Signed     NR/MEDQ  D:  06/02/2006  T:  06/02/2006  Job:  [id number]   cc:   Madelin Rear. Sherwood Gambler, MD  Fax: [phone]

## 2010-11-12 NOTE — H&P (Signed)
NAME:  Todd [name], Todd [name]                        ACCOUNT NO.:  [id number]   MEDICAL RECORD NO.:  [id number]                   PATIENT TYPE:  INP   LOCATION:  A211                                 FACILITY:  [facility]   PHYSICIAN:  Kirk Ruths, M.D.            DATE OF BIRTH:  [birth year]   DATE OF ADMISSION:  09/16/2003  DATE OF DISCHARGE:                                HISTORY & PHYSICAL   CHIEF COMPLAINT:  Seizure.   HISTORY OF PRESENT ILLNESS:  This is a 60 year old white male who is  employed at [employer]_.  The patient on the evening of admission was  watching TV and began having muscle twitching which developed into a full,  generalized grand mal seizure witnessed by his wife and daughter.  The  patient has loss of memory of probably 20 minutes of the event.  He was  brought to the emergency room by ambulance.  Workup essentially negative  except potassium slightly low.  Of interest, the patient had a similar  episode which was much milder last summer which was associated approximately  three days after he tried to quit drinking alcohol.  The patient states he  did not work last week, on a binge.  He stopped drinking March 19.  The  patient also stated he had been having some recurrent vomiting over the last  week which probably explains his low potassium.  The patient will be  admitted for further workup and evaluation of alcohol withdrawal seizures  and replacement of his potassium.   He is alert.   MEDICATIONS:  1. Lexapro 2 mg daily.  2. Toprol 50 mg daily for hypertension.  3. The patient also has a history of gout.   SOCIAL HISTORY:  The patient is a smoker.   REVIEW OF SYSTEMS:  He denies headache, chest pain, shortness of breath,  abdominal pain, although he is positive for recurrent vomiting of late.   PHYSICAL EXAMINATION:  GENERAL:  A well-developed white male, in no acute  distress.  He is afebrile.  VITAL SIGNS:  Blood pressure 140/80.  Respirations 20  and labored.  Pulse is  72 and regular.  HEENT:  TM's are normal.  Pupils equal to light and accommodation.  Oropharynx is benign.  NECK:  Supple without JVD, bruits or thyromegaly.  LUNGS:  Clear in all areas.  HEART:  Regular rate and rhythm, no murmur, gallop or rub.  ABDOMEN:  Soft, nontender.  EXTREMITIES:  Without clubbing, cyanosis or edema.  NEUROLOGIC:  Exam is grossly intact with reflexes full and equal  bilaterally.  Motor and sensory full bilaterally.   ASSESSMENT:  1. Alcoholic withdrawal seizure.  2. Hypokalemia probably secondary to vomiting.  3. History of hypertension.  4. History of gout.  5. History of alcohol abuse.     ___________________________________________  Kirk Ruths, M.D.   WMM/MEDQ  D:  09/17/2003  T:  09/17/2003  Job:  [id number]

## 2010-11-12 NOTE — Procedures (Signed)
NAME:  Todd [name], WARF                          ACCOUNT NO.:  [id number]   MEDICAL RECORD NO.:  [id number]                  PATIENT TYPE:   LOCATION:                                       FACILITY:   PHYSICIAN:  Kofi A. Gerilyn Pilgrim, M.D.              DATE OF BIRTH:   DATE OF PROCEDURE:  DATE OF DISCHARGE:                                        EEG   HISTORY:  This is a 60 year old man who has had a couple of seizures.  There  is some question of whether these are epileptic or due to alcohol  withdrawal.   ANALYSIS:  This is a 16-channel recording.  It is conducted for  approximately 20 minutes.  There is a posterior rhythm of 8 Hz which  attenuates with eye opening.  There is some artifact seen in the FP1 lead,  otherwise the quality of the recording is acceptable.  Only awake  activities/architecture is seen.  There is no focal slowing, lateralized  slowing, or epileptiform activity seen.   IMPRESSION:  This is a normal recording of the awake state.  There is no  epileptiform activity seen.  A single EEG does not necessarily rule out  epilepsy.  If clinically indicated a repeat sleep deprived recording may be  useful.      ___________________________________________                                            Darleen Crocker A. Gerilyn Pilgrim, M.D.   KAD/MEDQ  D:  09/30/2003  T:  09/30/2003  Job:  [id number]

## 2010-11-23 DIAGNOSIS — I10 Essential (primary) hypertension: Secondary | ICD-10-CM | POA: Insufficient documentation

## 2010-11-23 DIAGNOSIS — F17200 Nicotine dependence, unspecified, uncomplicated: Secondary | ICD-10-CM

## 2010-11-23 DIAGNOSIS — M109 Gout, unspecified: Secondary | ICD-10-CM | POA: Insufficient documentation

## 2010-11-25 ENCOUNTER — Encounter: Payer: Self-pay | Admitting: Cardiology

## 2010-11-25 ENCOUNTER — Ambulatory Visit (INDEPENDENT_AMBULATORY_CARE_PROVIDER_SITE_OTHER): Payer: BC Managed Care – PPO | Admitting: Cardiology

## 2010-11-25 ENCOUNTER — Other Ambulatory Visit: Payer: Self-pay

## 2010-11-25 VITALS — BP 124/85 | HR 84 | Wt 135.0 lb

## 2010-11-25 DIAGNOSIS — I4891 Unspecified atrial fibrillation: Secondary | ICD-10-CM

## 2010-11-25 DIAGNOSIS — R011 Cardiac murmur, unspecified: Secondary | ICD-10-CM

## 2010-11-25 DIAGNOSIS — F17200 Nicotine dependence, unspecified, uncomplicated: Secondary | ICD-10-CM

## 2010-11-25 DIAGNOSIS — F1010 Alcohol abuse, uncomplicated: Secondary | ICD-10-CM

## 2010-11-25 DIAGNOSIS — I10 Essential (primary) hypertension: Secondary | ICD-10-CM

## 2010-11-25 NOTE — Assessment & Plan Note (Signed)
We also addressed smoking cessation today.

## 2010-11-25 NOTE — Progress Notes (Signed)
Clinical Summary [name] is a 59 y.o.male referred by Dr. Regino [name] for consultation. He is a former patient of [facility], also saw Dr. Dietrich [name] in consultation back in 2009. History is noted below. He was diagnosed with atrial fibrillation several years ago, was on Coumadin for approximately one year, however this was discontinued in 2009 in the setting of a GI bleed. Due to his alcoholism and associated complications, he has been felt to be a poor candidate for Coumadin long-term.  [name] states he quit drinking alcohol approximately 3 months ago. He has had failed attempts to quit in the past. States he just recently retired from his long-term job, earlier this month. Reports that he is feeling well, working in the garden, trying to enjoy things.  He reports no sense of palpitations. He is only taking his Lopressor once daily. Blood pressure control has been good by report. Last echocardiogram from 2009 as noted below. He reports a long-standing history of "heart murmur."  Reports no focal motor weakness or speech deficits.   No Known Allergies  Current outpatient prescriptions:allopurinol (ZYLOPRIM) 100 MG tablet, Take 100 mg by mouth daily.  , Disp: , Rfl: ;  colchicine (COLCRYS) 0.6 MG tablet, Take 0.6 mg by mouth 2 (two) times daily.  , Disp: , Rfl: ;  cyclobenzaprine (FLEXERIL) 10 MG tablet, Take 10 mg by mouth 3 (three) times daily as needed.  , Disp: , Rfl: ;  ergocalciferol (VITAMIN D2) 50000 UNITS capsule, Take 50,000 Units by mouth once a week.  , Disp: , Rfl:  metoprolol tartrate (LOPRESSOR) 25 MG tablet, Take 25 mg by mouth 2 (two) times daily.  , Disp: , Rfl: ;  mirtazapine (REMERON) 15 MG tablet, Take 15 mg by mouth at bedtime.  , Disp: , Rfl: ;  omeprazole (PRILOSEC) 20 MG capsule, Take 20 mg by mouth daily.  , Disp: , Rfl: ;  oxycodone (OXY-IR) 5 MG capsule, Take 5 mg by mouth every 4 (four) hours as needed.  , Disp: , Rfl:  potassium chloride SA  (K-DUR,KLOR-CON) 20 MEQ tablet, Take 20 mEq by mouth daily.  , Disp: , Rfl: ;  DISCONTD: diltiazem (CARDIZEM) 60 MG tablet, Take 60 mg by mouth daily.  , Disp: , Rfl: ;  DISCONTD: predniSONE (DELTASONE) 5 MG tablet, Take 5 mg by mouth daily.  , Disp: , Rfl:   Past Medical History  Diagnosis Date  . Essential hypertension, benign   . Gout   . Alcoholism     History of withdrawal and seizures  . Chronic back pain   . Atrial fibrillation     Taken off of Coumadin in 2009 in the setting of GI bleed  . Osteoarthritis   . History of GI bleed   . Internal hemorrhoids     Colonoscopy 11/09  . Schatzki's ring     EGD 11/09  . Colitis     4/11    Past Surgical History  Procedure Date  . Right knee arthroscopy   . Spine surgery   . Excision of gouty lesion     Left index finger    Family History  Problem Relation Age of Onset  . Coronary artery disease Father   . Coronary artery disease Sister     MI at age 9    Social History [name] reports that he has been smoking Cigarettes.  He has never used smokeless tobacco. [name] reports that he does not drink alcohol.  Review  of Systems No reported melena or hematochezia. Indicates stable appetite. No orthopnea or PND. Otherwise reviewed and negative.  Physical Examination Filed Vitals:   11/25/10 0934  BP: 124/85  Pulse: 84  Thin male, no acute distress. HEENT: Conjunctiva and lids normal, oropharynx with poor dentition. Neck: Supple, no elevated JVP or carotid bruits, no thyromegaly. Lungs: Clear to auscultation, nonlabored. Cardiac: Regular rate and rhythm with frequent ectopic beats, soft basal systolic murmur, preserved second heart sound, no rub or gallop. Abdomen: Soft, nontender, bowel sounds present. Skin: Warm and dry. Extremities: No pitting edema, distal pulses 1-2+. Musculoskeletal: No kyphosis. Neuropsychiatric: Alert and oriented x3, affect appropriate.   ECG Sinus rhythm at 74 beats per minute with  frequent PACs, poor anterior R wave progression.  Studies Echocardiogram 07/04/2007: 1. Technically adequate echocardiographic study.   2. Mild left atrial enlargement; normal right atrial size.   3. The right ventricle is prominent but not clearly enlarged; no RVH;       normal RV systolic function.   4. Trileaflet and normal aortic valve.  Normal proximal ascending       aorta.   5. Normal mitral valve; very mild regurgitation.   6. Normal tricuspid valve; physiologic regurgitation.   7. Normal pulmonic valve and proximal pulmonary artery.   8. Normal left ventricular size; mild hypertrophy; hyperdynamic       regional and global function.   9. Normal IVC.   10.Minimal posterior pericardial effusion.   Problem List and Plan

## 2010-11-25 NOTE — Patient Instructions (Signed)
Your physician has requested that you have an echocardiogram. Echocardiography is a painless test that uses sound waves to create images of your heart. It provides your doctor with information about the size and shape of your heart and how well your heart's chambers and valves are working. This procedure takes approximately one hour. There are no restrictions for this procedure.  Your physician recommends that you continue on your current medications as directed. Please refer to the Current Medication list given to you today.  Your physician recommends that you schedule a follow-up appointment in: 6 months  

## 2010-11-25 NOTE — Assessment & Plan Note (Signed)
Presumably paroxysmal, diagnosed several years ago, and generally a poor Coumadin candidate with prior history of GI bleeding, and alcoholism with previously documented electrolyte abnormalities, withdrawal, and seizures. He does not report any regular sense of palpitations, no chest pain. ECG today shows sinus rhythm with frequent atrial ectopy. I encouraged him with his most recent alcohol quit attempt. Also discussed regular diet and exercise. Ideally he should be taking his Lopressor twice a day, and we discussed this. Otherwise no plan for long-term anticoagulation at this point. Will follow him symptomatically.

## 2010-11-25 NOTE — Assessment & Plan Note (Signed)
Suspect that this is rather benign based on prior assessment. He has a soft, likely functional murmur today. In light of his abnormal ECG and history of atrial fibrillation, a followup 2-D echocardiogram will be obtained for review.

## 2010-11-25 NOTE — Assessment & Plan Note (Signed)
Blood pressure is well controlled at this point.

## 2010-11-25 NOTE — Assessment & Plan Note (Signed)
Patient states that he quit drinking 3 months ago. Hopefully he will be able to maintain long-term cessation.

## 2010-12-01 ENCOUNTER — Encounter: Payer: Self-pay | Admitting: Cardiology

## 2010-12-03 ENCOUNTER — Other Ambulatory Visit (HOSPITAL_COMMUNITY): Payer: BC Managed Care – PPO

## 2010-12-03 ENCOUNTER — Ambulatory Visit (HOSPITAL_COMMUNITY)
Admission: RE | Admit: 2010-12-03 | Discharge: 2010-12-03 | Disposition: A | Discharge status: Home / Self Care | Payer: BC Managed Care – PPO | Source: Ambulatory Visit | Attending: Cardiology | Admitting: Cardiology

## 2010-12-03 DIAGNOSIS — F17200 Nicotine dependence, unspecified, uncomplicated: Secondary | ICD-10-CM | POA: Insufficient documentation

## 2010-12-03 DIAGNOSIS — I517 Cardiomegaly: Secondary | ICD-10-CM

## 2010-12-03 DIAGNOSIS — I10 Essential (primary) hypertension: Secondary | ICD-10-CM | POA: Insufficient documentation

## 2010-12-03 DIAGNOSIS — R011 Cardiac murmur, unspecified: Secondary | ICD-10-CM | POA: Insufficient documentation

## 2010-12-03 DIAGNOSIS — I4891 Unspecified atrial fibrillation: Secondary | ICD-10-CM | POA: Insufficient documentation

## 2010-12-13 ENCOUNTER — Other Ambulatory Visit (HOSPITAL_COMMUNITY): Payer: Self-pay | Admitting: Neurology

## 2010-12-13 DIAGNOSIS — R404 Transient alteration of awareness: Secondary | ICD-10-CM

## 2010-12-14 ENCOUNTER — Ambulatory Visit (HOSPITAL_COMMUNITY): Admission: RE | Admit: 2010-12-14 | Payer: BC Managed Care – PPO | Source: Ambulatory Visit

## 2010-12-31 ENCOUNTER — Other Ambulatory Visit: Payer: Self-pay | Admitting: Cardiology

## 2011-01-04 IMAGING — CT CT CHEST W/ CM
2 of 3 series · 15 of 36 positions shown, 18 images · IV contrast (agent unspecified)
Comparison: 07/02/2003
Correlation:  Chest radiograph 09/17/2009

CLINICAL DATA: Abnormal chest x-ray, shortness of breath, weight
loss, smoker

CT CHEST WITH CONTRAST
TECHNIQUE: Multidetector CT imaging of the chest was performed
following the standard protocol during bolus administration of
intravenous contrast. Breast shield utilized.  Sagittal and coronal
MPR images reconstructed from axial data set.
Contrast: 80 ml Ymnipaque-WPP IV

[Series 2: chestroutine 5.0 b40f · axial · 0.72mm/px · z∈[-340,-70]mm · 12 of 64 slices shown, 15 images]
[im 5/64  mediastinal]
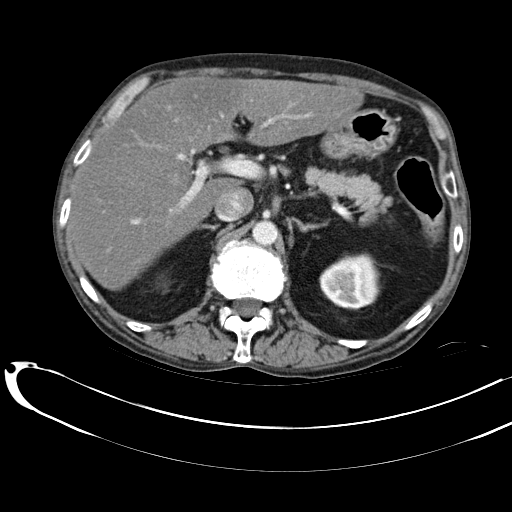
[im 5/64  lung]
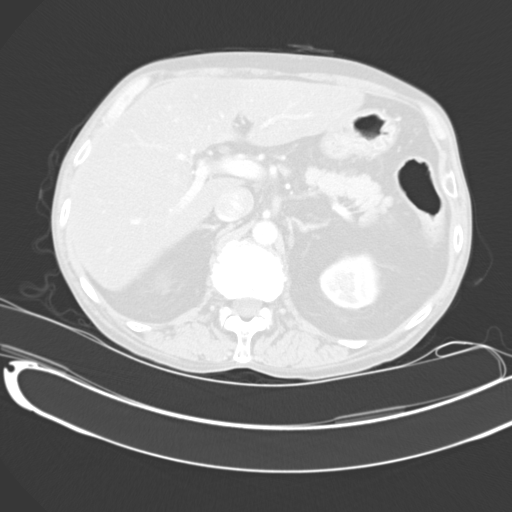
[im 10/64  lung]
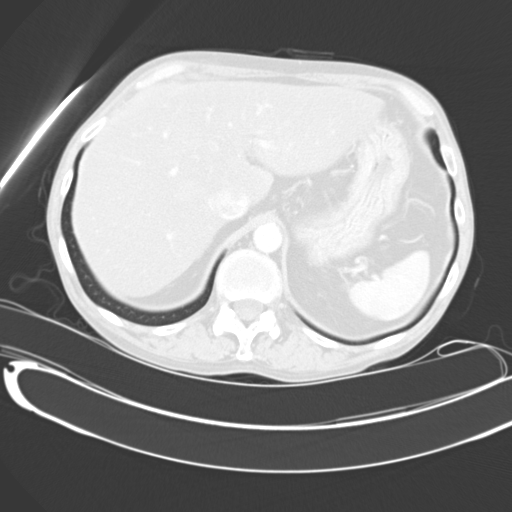
[im 15/64  lung]
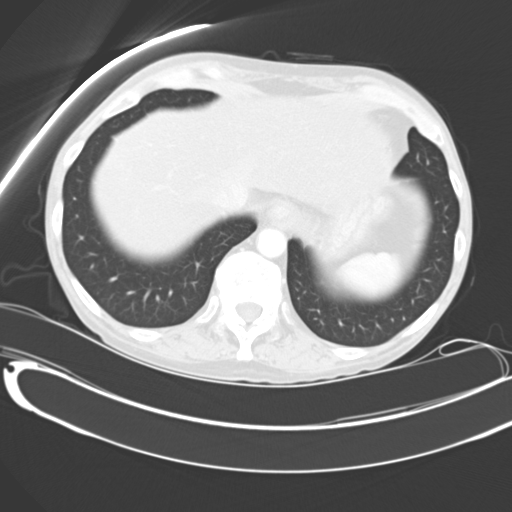
[im 19/64  lung]
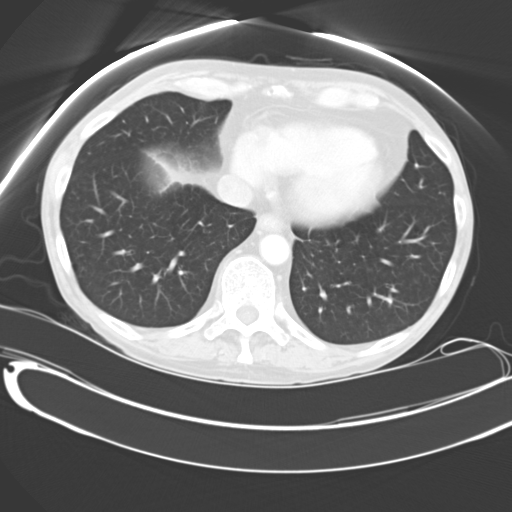
[im 24/64  mediastinal]
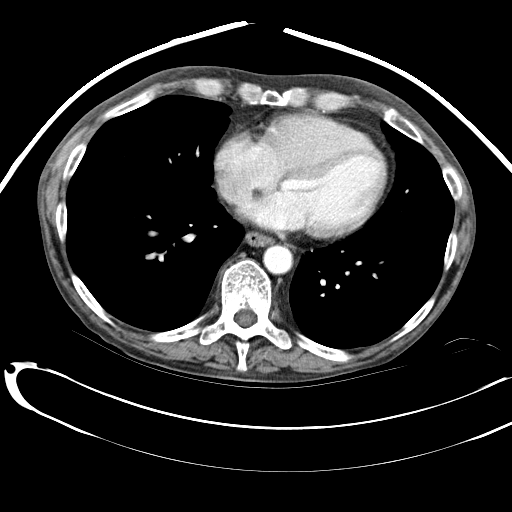
[im 24/64  lung]
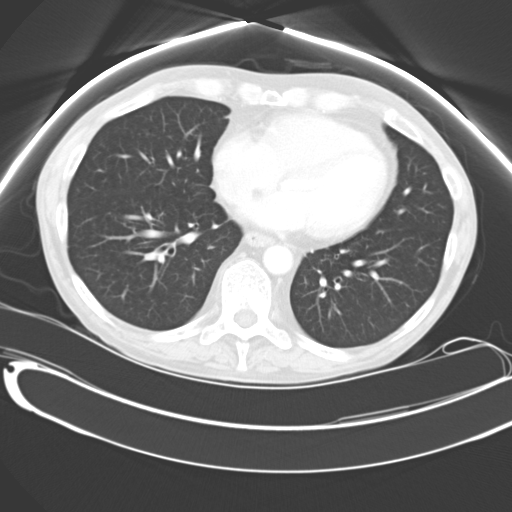
[im 29/64  lung]
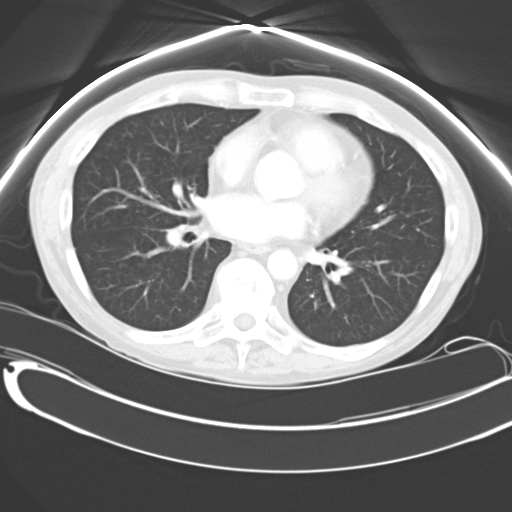
[im 36/64  lung]
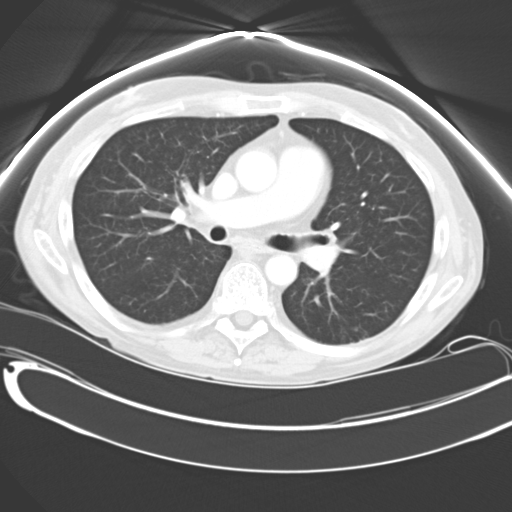
[im 40/64  lung]
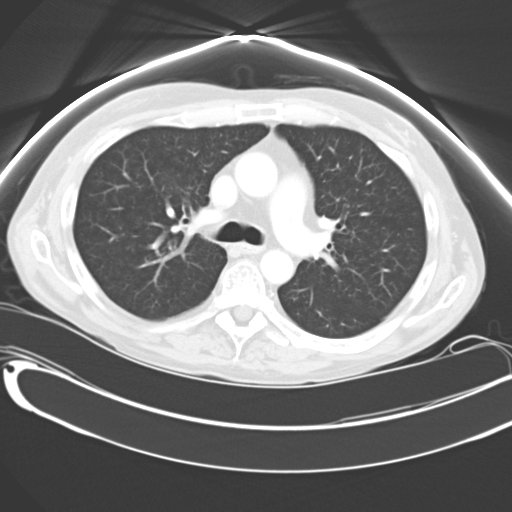
[im 45/64  mediastinal]
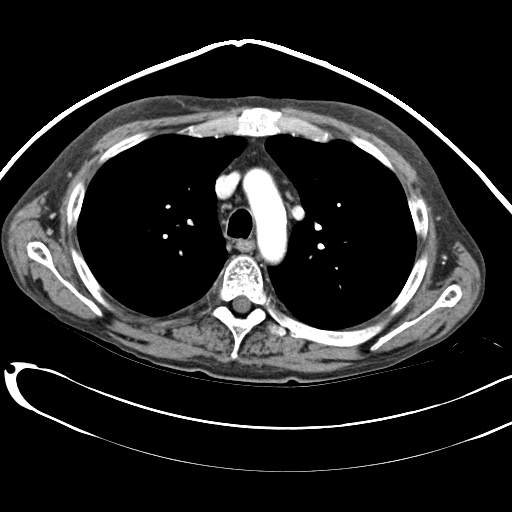
[im 45/64  lung]
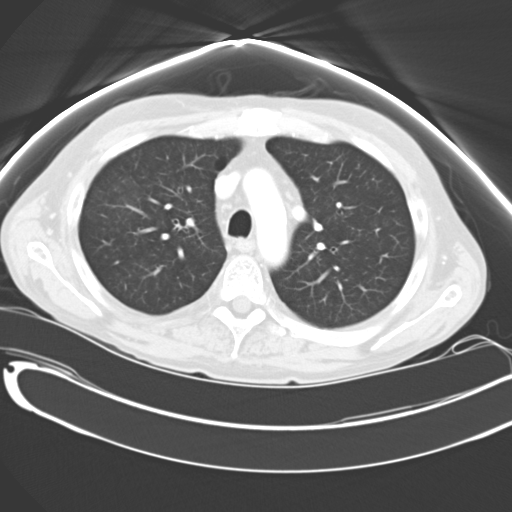
[im 50/64  lung]
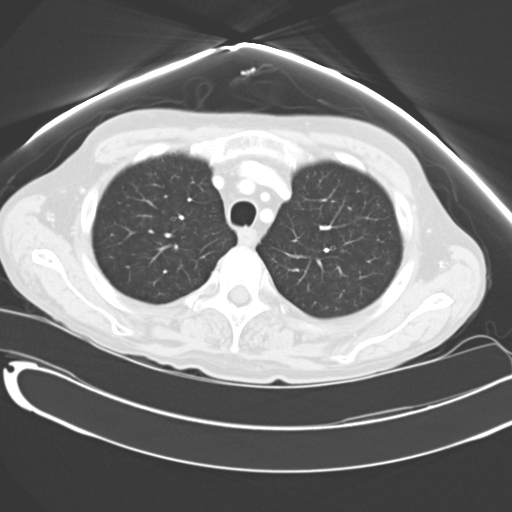
[im 54/64  lung]
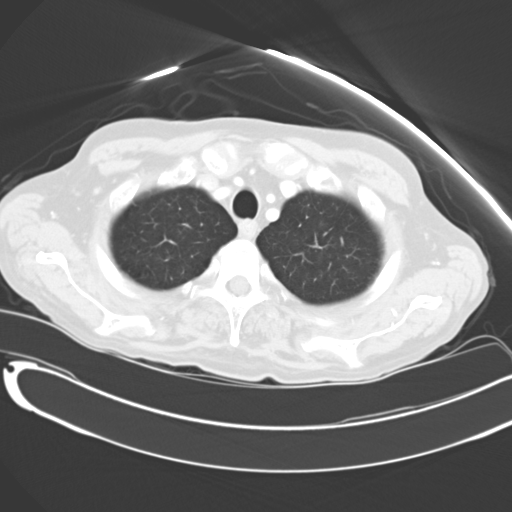
[im 59/64  lung]
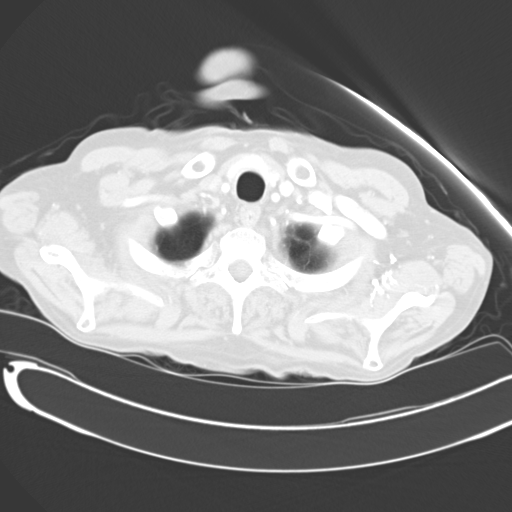

[Series 4: mpr coronal chest 3mm · coronal · 0.63mm/px · 3 of 72 slices shown]
[im 15/72  lung]
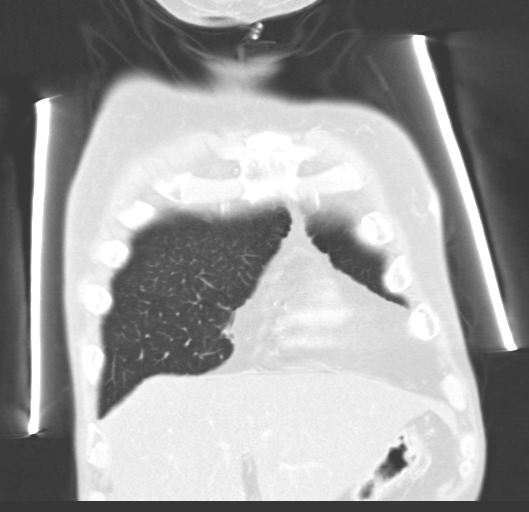
[im 29/72  lung]
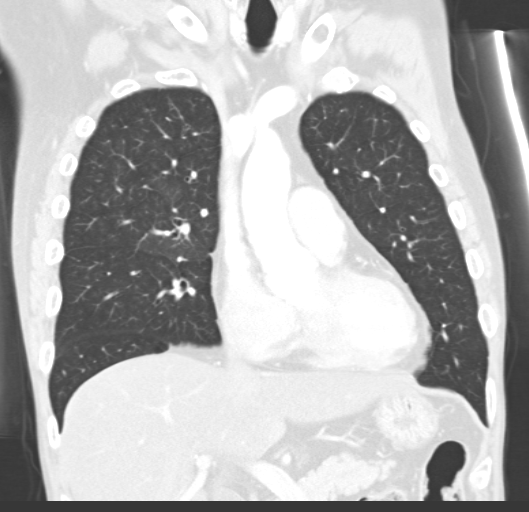
[im 43/72  lung]
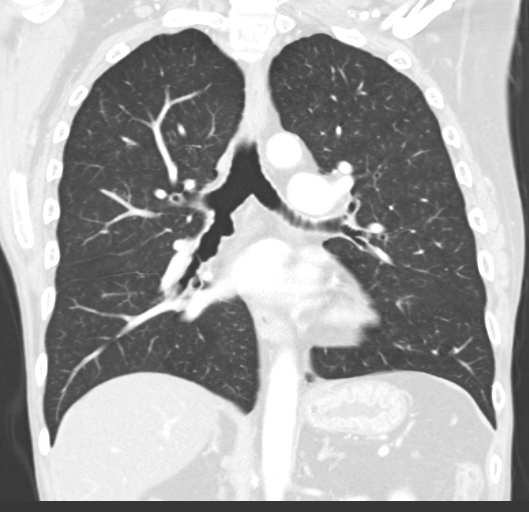

[15 of 36 positions shown; findings below may reference images not displayed]

FINDINGS: Scattered atherosclerotic calcifications aorta and coronary
arteries.
Pulmonary arteries grossly patent on non dedicated exam.
Scattered normal-sized thoracic lymph nodes without adenopathy.
Mild fatty infiltration of liver.
Remaining visualized portions of upper abdomen unremarkable.

Mild central peribronchial thickening.
Lungs otherwise clear.
No pleural effusion,, pneumothorax, or segmental infiltrate.
Calcified granulomata right lung.
No additional pulmonary mass or nodule.
Specifically, area of chest radiographic abnormality is clear on
CT, compatible with preceding summation artifact on chest
radiography.
Bones diffusely demineralized with compression deformity of T9
vertebral body, greater 50% height loss, appears old.
Multiple additional vertebral endplates show irregularity without
additional compressions.
IMPRESSION: No acute pulmonary abnormalities or mass/nodule.
Old granulomatous disease right lung.
Mild fatty infiltration of liver.
Old appearing compression deformity of T9 vertebral body, unchanged
since chest radiograph of 07/05/2007.

## 2011-03-16 LAB — CROSSMATCH
ABO/RH(D): O NEG
Antibody Screen: NEGATIVE

## 2011-03-16 LAB — CBC
HCT: 26.9 — ABNORMAL LOW
HCT: 27.5 — ABNORMAL LOW
HCT: 28.8 — ABNORMAL LOW
Hemoglobin: 9.1 — ABNORMAL LOW
Hemoglobin: 9.6 — ABNORMAL LOW
MCHC: 33.3
MCHC: 33.5
MCV: 102.9 — ABNORMAL HIGH
MCV: 104.4 — ABNORMAL HIGH
Platelets: 273
Platelets: 514 — ABNORMAL HIGH
RBC: 2.57 — ABNORMAL LOW
RBC: 3.38 — ABNORMAL LOW
RDW: 14.7
RDW: 14.8
RDW: 15.2
WBC: 9.4
WBC: 9.6

## 2011-03-16 LAB — LIPID PANEL
Cholesterol: 138
LDL Cholesterol: 94

## 2011-03-16 LAB — BASIC METABOLIC PANEL
BUN: 10
BUN: 12
BUN: 6
BUN: 8
CO2: 23
CO2: 24
CO2: 25
CO2: 26
Calcium: 7.7 — ABNORMAL LOW
Calcium: 7.9 — ABNORMAL LOW
Calcium: 8 — ABNORMAL LOW
Chloride: 101
Chloride: 105
Creatinine, Ser: 0.88
Creatinine, Ser: 0.94
GFR calc Af Amer: >60
GFR calc non Af Amer: >60
GFR calc non Af Amer: >60
Glucose, Bld: 114 — ABNORMAL HIGH
Glucose, Bld: 121 — ABNORMAL HIGH
Glucose, Bld: 125 — ABNORMAL HIGH
Glucose, Bld: 83
Potassium: 2.9 — ABNORMAL LOW
Potassium: 3.3 — ABNORMAL LOW
Sodium: 134 — ABNORMAL LOW
Sodium: 134 — ABNORMAL LOW
Sodium: 136

## 2011-03-16 LAB — COMPREHENSIVE METABOLIC PANEL
Albumin: 2.2 — ABNORMAL LOW
Albumin: 2.8 — ABNORMAL LOW
BUN: 11
CO2: 31
Calcium: 7.7 — ABNORMAL LOW
Chloride: 83 — ABNORMAL LOW
GFR calc Af Amer: >60
Glucose, Bld: 115 — ABNORMAL HIGH
Potassium: 2.2 — CL
Sodium: 128 — ABNORMAL LOW
Sodium: 134 — ABNORMAL LOW
Total Protein: 5.3 — ABNORMAL LOW

## 2011-03-16 LAB — URINALYSIS, ROUTINE W REFLEX MICROSCOPIC
Glucose, UA: NEGATIVE
Hgb urine dipstick: NEGATIVE
Protein, ur: NEGATIVE
Specific Gravity, Urine: 1.02
pH: 6

## 2011-03-16 LAB — DIFFERENTIAL
Basophils Absolute: 0
Basophils Absolute: 0
Basophils Absolute: 0
Basophils Relative: 0
Basophils Relative: 0
Basophils Relative: 0
Eosinophils Absolute: 0
Eosinophils Relative: 0
Eosinophils Relative: 0
Lymphocytes Relative: 17
Lymphocytes Relative: 6 — ABNORMAL LOW
Lymphs Abs: 0.5 — ABNORMAL LOW
Lymphs Abs: 0.5 — ABNORMAL LOW
Lymphs Abs: 1.7
Monocytes Absolute: 0.9
Monocytes Absolute: 1.2 — ABNORMAL HIGH
Monocytes Absolute: 1.6 — ABNORMAL HIGH
Monocytes Relative: 13 — ABNORMAL HIGH
Monocytes Relative: 13 — ABNORMAL HIGH
Monocytes Relative: 17 — ABNORMAL HIGH
Neutro Abs: 5.7
Neutro Abs: 6.7
Neutro Abs: 7.3
Neutro Abs: 7.5
Neutro Abs: 7.6
Neutrophils Relative %: 78 — ABNORMAL HIGH
Neutrophils Relative %: 81 — ABNORMAL HIGH
Neutrophils Relative %: 81 — ABNORMAL HIGH

## 2011-03-16 LAB — HEMOGLOBIN AND HEMATOCRIT, BLOOD
HCT: 25.7 — ABNORMAL LOW
HCT: 27 — ABNORMAL LOW
HCT: 27 — ABNORMAL LOW
Hemoglobin: 8.5 — ABNORMAL LOW
Hemoglobin: 9 — ABNORMAL LOW
Hemoglobin: 9.7 — ABNORMAL LOW

## 2011-03-16 LAB — HEPATIC FUNCTION PANEL
ALT: 35
AST: 42 — ABNORMAL HIGH
Alkaline Phosphatase: 55
Bilirubin, Direct: 0.2
Indirect Bilirubin: 0.3
Total Bilirubin: 0.5

## 2011-03-16 LAB — CARDIAC PANEL(CRET KIN+CKTOT+MB+TROPI)
CK, MB: 0.5
CK, MB: 1.4
CK, MB: 1.5
CK, MB: 1.6
CK, MB: 2.1
Relative Index: INVALID
Relative Index: INVALID
Relative Index: INVALID
Relative Index: INVALID
Total CK: 21
Total CK: 29
Total CK: 32
Troponin I: 0.06
Troponin I: 0.11 — ABNORMAL HIGH

## 2011-03-16 LAB — CK: Total CK: 21

## 2011-03-16 LAB — IRON AND TIBC
Saturation Ratios: 13 — ABNORMAL LOW
UIBC: 143

## 2011-03-16 LAB — APTT: aPTT: 39 — ABNORMAL HIGH

## 2011-03-16 LAB — OCCULT BLOOD X 1 CARD TO LAB, STOOL: Fecal Occult Bld: POSITIVE

## 2011-03-16 LAB — ABO/RH: ABO/RH(D): O NEG

## 2011-03-16 LAB — PROTIME-INR
INR: 1
Prothrombin Time: 13.1

## 2011-03-16 LAB — KOH PREP

## 2011-03-16 LAB — MAGNESIUM: Magnesium: 1.7

## 2011-03-16 LAB — B-NATRIURETIC PEPTIDE (CONVERTED LAB): Pro B Natriuretic peptide (BNP): 1260 — ABNORMAL HIGH

## 2011-03-29 LAB — URINALYSIS, ROUTINE W REFLEX MICROSCOPIC
Bilirubin Urine: NEGATIVE
Glucose, UA: NEGATIVE
Ketones, ur: NEGATIVE
pH: 7

## 2011-03-29 LAB — CBC
HCT: 23.5 — ABNORMAL LOW
HCT: 28.4 — ABNORMAL LOW
HCT: 34 — ABNORMAL LOW
Hemoglobin: 11.6 — ABNORMAL LOW
Hemoglobin: 8.2 — ABNORMAL LOW
Hemoglobin: 9.7 — ABNORMAL LOW
MCHC: 34.2
MCHC: 34.7
MCV: 90.4
MCV: 90.4
MCV: 90.7
MCV: 91.5
Platelets: 154
Platelets: 63 — ABNORMAL LOW
RBC: 2.6 — ABNORMAL LOW
RBC: 3.76 — ABNORMAL LOW
RDW: 14.3
RDW: 15.3
WBC: 10.1
WBC: 11.7 — ABNORMAL HIGH
WBC: 17.5 — ABNORMAL HIGH

## 2011-03-29 LAB — HEPATIC FUNCTION PANEL
ALT: 13
Bilirubin, Direct: 0.3
Indirect Bilirubin: 0.9
Total Bilirubin: 1.2

## 2011-03-29 LAB — DIFFERENTIAL
Band Neutrophils: 5
Basophils Absolute: 0
Basophils Absolute: 0.1
Basophils Absolute: 0.1
Basophils Relative: 1
Basophils Relative: 1
Blasts: 0
Eosinophils Absolute: 0
Eosinophils Absolute: 0.1
Eosinophils Relative: 0
Eosinophils Relative: 0
Lymphocytes Relative: 12
Lymphocytes Relative: 21
Lymphocytes Relative: 6 — ABNORMAL LOW
Lymphs Abs: 0.6 — ABNORMAL LOW
Lymphs Abs: 1.8
Lymphs Abs: 2.3
Lymphs Abs: 3.7
Monocytes Absolute: 0.3
Monocytes Absolute: 1.1 — ABNORMAL HIGH
Monocytes Relative: 3
Monocytes Relative: 7
Monocytes Relative: 7
Myelocytes: 0
Neutro Abs: 11.5 — ABNORMAL HIGH
Neutro Abs: 12.5 — ABNORMAL HIGH
Neutrophils Relative %: 71
Neutrophils Relative %: 78 — ABNORMAL HIGH
Promyelocytes Absolute: 0

## 2011-03-29 LAB — CROSSMATCH
ABO/RH(D): O NEG
Antibody Screen: NEGATIVE

## 2011-03-29 LAB — CULTURE, BLOOD (ROUTINE X 2)
Culture: NO GROWTH
Report Status: 11262009

## 2011-03-29 LAB — BASIC METABOLIC PANEL
BUN: 9
CO2: 24
Calcium: 7.5 — ABNORMAL LOW
Chloride: 102
Chloride: 96
Chloride: 98
Creatinine, Ser: 1.04
GFR calc Af Amer: >60
GFR calc non Af Amer: >60
Glucose, Bld: 103 — ABNORMAL HIGH
Potassium: 3.9
Potassium: 3.9
Sodium: 135
Sodium: 136

## 2011-03-29 LAB — PROTIME-INR
INR: >9.8
Prothrombin Time: >90 — ABNORMAL HIGH

## 2011-03-29 LAB — COMPREHENSIVE METABOLIC PANEL WITH GFR
ALT: 15
AST: 33
Albumin: 3.4 — ABNORMAL LOW
CO2: 24
Chloride: 97
GFR calc Af Amer: >60
GFR calc non Af Amer: 55 — ABNORMAL LOW
Potassium: 3.2 — ABNORMAL LOW
Sodium: 137
Total Bilirubin: 0.6

## 2011-03-29 LAB — PREPARE FRESH FROZEN PLASMA

## 2011-03-29 LAB — HEMOGLOBIN AND HEMATOCRIT, BLOOD
HCT: 25.3 — ABNORMAL LOW
HCT: 26.2 — ABNORMAL LOW
HCT: 29.5 — ABNORMAL LOW
Hemoglobin: 10.1 — ABNORMAL LOW
Hemoglobin: 8.5 — ABNORMAL LOW
Hemoglobin: 8.8 — ABNORMAL LOW
Hemoglobin: 9.1 — ABNORMAL LOW
Hemoglobin: 9.9 — ABNORMAL LOW

## 2011-03-29 LAB — MAGNESIUM
Magnesium: 1.1 — ABNORMAL LOW
Magnesium: 1.6

## 2011-03-29 LAB — APTT: aPTT: >200

## 2011-03-29 LAB — COMPREHENSIVE METABOLIC PANEL
Alkaline Phosphatase: 106
BUN: 17
Calcium: 8.2 — ABNORMAL LOW
Creatinine, Ser: 1.34
Glucose, Bld: 109 — ABNORMAL HIGH
Total Protein: 6.2

## 2011-04-01 LAB — DIFFERENTIAL
Basophils Absolute: 0
Basophils Relative: 0
Eosinophils Absolute: 0 — ABNORMAL LOW
Monocytes Relative: 7
Neutro Abs: 8 — ABNORMAL HIGH
Neutrophils Relative %: 82 — ABNORMAL HIGH

## 2011-04-01 LAB — CBC
MCHC: 34.4
MCV: 103 — ABNORMAL HIGH
RBC: 3.36 — ABNORMAL LOW
RDW: 14.2

## 2011-04-01 LAB — BASIC METABOLIC PANEL
BUN: 10
CO2: 32
Calcium: 9.1
Chloride: 86 — ABNORMAL LOW
Creatinine, Ser: 1.49
Glucose, Bld: 120 — ABNORMAL HIGH

## 2011-04-01 LAB — POCT CARDIAC MARKERS: Myoglobin, poc: 97.4

## 2011-05-16 ENCOUNTER — Ambulatory Visit: Payer: BC Managed Care – PPO | Admitting: Cardiology

## 2011-05-31 ENCOUNTER — Encounter: Payer: Self-pay | Admitting: Cardiology

## 2011-06-03 ENCOUNTER — Ambulatory Visit: Payer: BC Managed Care – PPO | Admitting: Cardiology

## 2011-06-06 ENCOUNTER — Ambulatory Visit: Payer: BC Managed Care – PPO | Admitting: Cardiology

## 2011-07-05 ENCOUNTER — Ambulatory Visit: Payer: BC Managed Care – PPO | Admitting: Cardiology

## 2011-07-13 ENCOUNTER — Ambulatory Visit (INDEPENDENT_AMBULATORY_CARE_PROVIDER_SITE_OTHER): Payer: Commercial Indemnity | Admitting: Cardiology

## 2011-07-13 ENCOUNTER — Encounter: Payer: Self-pay | Admitting: Cardiology

## 2011-07-13 DIAGNOSIS — Z8679 Personal history of other diseases of the circulatory system: Secondary | ICD-10-CM

## 2011-07-13 DIAGNOSIS — I4891 Unspecified atrial fibrillation: Secondary | ICD-10-CM

## 2011-07-13 NOTE — Assessment & Plan Note (Signed)
Echocardiogram reviewed. No major valvular abnormalities. Does have some mitral bowing without prolapse but only trivial mitral regurgitation.

## 2011-07-13 NOTE — Patient Instructions (Signed)
Your physician recommends that you continue on your current medications as directed. Please refer to the Current Medication list given to you today.  Your physician recommends that you schedule a follow-up appointment in: 6 months  

## 2011-07-13 NOTE — Progress Notes (Signed)
   Clinical Summary Todd [name] is a 61 y.o.male presenting for followup. He was seen in June.  He states that he continues to abstain from alcohol. Still struggling with quitting smoking. He reports better energy, good appetite.  He denies any major sense of palpitations. No chest pain. Reports compliance with his medications.  Echocardiogram from June 2012 showed LVEF of 55% with no regional wall motion abnormalities, mild biatrial enlargement, systolic bowing of the mitral valve with trivial regurgitation, no pericardial effusion.   No Known Allergies  Current Outpatient Prescriptions  Medication Sig Dispense Refill  . allopurinol (ZYLOPRIM) 100 MG tablet Take 100 mg by mouth daily.        . colchicine (COLCRYS) 0.6 MG tablet Take 0.6 mg by mouth 2 (two) times daily.        . ergocalciferol (VITAMIN D2) 50000 UNITS capsule Take 50,000 Units by mouth once a week.        . metoprolol tartrate (LOPRESSOR) 25 MG tablet Take 1 tablet (25 mg total) by mouth 2 (two) times daily.  60 tablet  6  . omeprazole (PRILOSEC) 20 MG capsule Take 20 mg by mouth daily.        . potassium chloride SA (K-DUR,KLOR-CON) 20 MEQ tablet Take 20 mEq by mouth daily.          Past Medical History  Diagnosis Date  . Essential hypertension, benign   . Gout   . Alcoholism     History of withdrawal and seizures  . Chronic back pain   . Atrial fibrillation     Taken off of Coumadin in 2009 in the setting of GI bleed  . Osteoarthritis   . History of GI bleed   . Internal hemorrhoids     Colonoscopy 11/09  . Schatzki's ring     EGD 11/09  . Colitis     4/11    Social History Todd [name] reports that he has been smoking Cigarettes.  He has never used smokeless tobacco. Todd [name] reports that he does not drink alcohol.  Review of Systems Negative except as outlined above.  Physical Examination Filed Vitals:   07/13/11 1511  BP: 117/82  Pulse: 85  Resp: 18   Thin male, no acute distress.  HEENT:  Conjunctiva and lids normal, oropharynx with poor dentition.  Neck: Supple, no elevated JVP or carotid bruits, no thyromegaly.  Lungs: Clear to auscultation, nonlabored.  Cardiac: Irregular, soft basal systolic murmur, preserved second heart sound, no rub or gallop.  Abdomen: Soft, nontender, bowel sounds present.  Skin: Warm and dry.  Extremities: No pitting edema, distal pulses 1-2+.    Problem List and Plan

## 2011-07-13 NOTE — Assessment & Plan Note (Signed)
Paroxysmal, diagnosed several years ago, and generally a poor Coumadin candidate with prior history of GI bleeding, and alcoholism with previously documented electrolyte abnormalities, withdrawal, and seizures. He reports cessation of alcohol use. Also compliance with Lopressor. No change to current regimen.

## 2011-10-03 ENCOUNTER — Other Ambulatory Visit: Payer: Self-pay | Admitting: Cardiology

## 2012-04-10 ENCOUNTER — Ambulatory Visit (INDEPENDENT_AMBULATORY_CARE_PROVIDER_SITE_OTHER): Payer: Self-pay | Admitting: Cardiology

## 2012-04-10 ENCOUNTER — Encounter: Payer: Self-pay | Admitting: Cardiology

## 2012-04-10 VITALS — BP 130/82 | HR 72 | Ht 68.0 in | Wt 138.0 lb

## 2012-04-10 DIAGNOSIS — I10 Essential (primary) hypertension: Secondary | ICD-10-CM

## 2012-04-10 DIAGNOSIS — I4891 Unspecified atrial fibrillation: Secondary | ICD-10-CM

## 2012-04-10 NOTE — Assessment & Plan Note (Signed)
Blood pressure is reasonable today. 

## 2012-04-10 NOTE — Assessment & Plan Note (Signed)
Sinus rhythm with frequent PACs today. Continue Lopressor. Not on anticoagulation with history of GI bleed and also prior EtOH abuse. ECG reviewed.

## 2012-04-10 NOTE — Patient Instructions (Addendum)
Your physician recommends that you schedule a follow-up appointment in: 6 months  

## 2012-04-10 NOTE — Progress Notes (Signed)
   Clinical Summary Todd [name] is a 61 y.o.male presenting for followup. He was seen in January of this year. Doing relatively well without significant palpitations, no falls or syncope.  ECG today shows sinus rhythm with frequent PACs, decreased R wave progression.  No major functional limitations, has been doing house painting and other chores.   No Known Allergies  Current Outpatient Prescriptions  Medication Sig Dispense Refill  . allopurinol (ZYLOPRIM) 100 MG tablet Take 100 mg by mouth daily.        . colchicine (COLCRYS) 0.6 MG tablet Take 0.6 mg by mouth 2 (two) times daily.        . ergocalciferol (VITAMIN D2) 50000 UNITS capsule Take 50,000 Units by mouth once a week.        . metoprolol tartrate (LOPRESSOR) 25 MG tablet TAKE ONE TABLET TWICE DAILY.  60 tablet  6  . omeprazole (PRILOSEC) 20 MG capsule Take 20 mg by mouth daily.        . potassium chloride SA (K-DUR,KLOR-CON) 20 MEQ tablet Take 20 mEq by mouth daily.          Past Medical History  Diagnosis Date  . Essential hypertension, benign   . Gout   . Alcoholism     History of withdrawal and seizures  . Chronic back pain   . Atrial fibrillation     Taken off of Coumadin in 2009 in the setting of GI bleed  . Osteoarthritis   . History of GI bleed   . Internal hemorrhoids     Colonoscopy 11/09  . Schatzki's ring     EGD 11/09  . Colitis     4/11    Social History Todd [name] reports that he has been smoking Cigarettes.  He has never used smokeless tobacco. Todd [name] reports that he does not drink alcohol.  Review of Systems Some sinus congestion. No fevers or chills, no cough. Otherwise negative.  Physical Examination Filed Vitals:   04/10/12 1319  BP: 130/82  Pulse: 72   Filed Weights   04/10/12 1319  Weight: 138 lb (62.596 kg)    Thin male, no acute distress.  HEENT: Conjunctiva and lids normal, oropharynx with poor dentition.  Neck: Supple, no elevated JVP or carotid bruits, no thyromegaly.   Lungs: Clear to auscultation, nonlabored.  Cardiac: Irregular, soft basal systolic murmur, preserved second heart sound, no rub or gallop.  Abdomen: Soft, nontender, bowel sounds present.  Skin: Warm and dry.  Extremities: No pitting edema, distal pulses 1-2+.     Problem List and Plan   ATRIAL FIBRILLATION Sinus rhythm with frequent PACs today. Continue Lopressor. Not on anticoagulation with history of GI bleed and also prior EtOH abuse. ECG reviewed.  HYPERTENSION Blood pressure is reasonable today.    Jonelle Sidle, M.D., F.A.C.C.

## 2012-05-16 ENCOUNTER — Other Ambulatory Visit: Payer: Self-pay | Admitting: Cardiology

## 2012-10-18 ENCOUNTER — Other Ambulatory Visit: Payer: Self-pay | Admitting: Cardiology

## 2013-02-13 ENCOUNTER — Other Ambulatory Visit: Payer: Self-pay | Admitting: Cardiology

## 2013-03-15 ENCOUNTER — Ambulatory Visit: Payer: BC Managed Care – PPO | Admitting: Cardiology

## 2013-03-29 ENCOUNTER — Ambulatory Visit: Payer: BC Managed Care – PPO | Admitting: Cardiology

## 2013-04-18 ENCOUNTER — Encounter: Payer: Self-pay | Admitting: Cardiology

## 2013-04-18 ENCOUNTER — Ambulatory Visit (INDEPENDENT_AMBULATORY_CARE_PROVIDER_SITE_OTHER): Payer: Self-pay | Admitting: Cardiology

## 2013-04-18 VITALS — BP 152/74 | HR 63 | Ht 68.0 in | Wt 139.0 lb

## 2013-04-18 DIAGNOSIS — F17200 Nicotine dependence, unspecified, uncomplicated: Secondary | ICD-10-CM

## 2013-04-18 DIAGNOSIS — I4891 Unspecified atrial fibrillation: Secondary | ICD-10-CM

## 2013-04-18 DIAGNOSIS — I10 Essential (primary) hypertension: Secondary | ICD-10-CM

## 2013-04-18 MED ORDER — METOPROLOL TARTRATE 25 MG PO TABS: 25.0000 mg | ORAL_TABLET | Freq: Two times a day (BID) | ORAL | Status: DC

## 2013-04-18 NOTE — Assessment & Plan Note (Signed)
Smoking cessation has been discussed. He has not been inclined to quit.

## 2013-04-18 NOTE — Assessment & Plan Note (Signed)
Blood pressure is elevated today. He has been out of Lopressor. Refill provided.

## 2013-04-18 NOTE — Patient Instructions (Addendum)
Your physician recommends that you schedule a follow-up appointment in: ONE YEAR  Your physician has recommended you make the following change in your medication:   1) START TAKING ECCENTRIC COATED ASPRIN 81MG  DAILY

## 2013-04-18 NOTE — Progress Notes (Signed)
    Clinical Summary Mr. Capek is a 62 y.o.male last seen in October 2013. He reports no problems with palpitations, no speech deficits or focal motor weakness. ECG today shows sinus rhythm as before, left atrial enlargement, poor anterior R wave progression.  He is out of Lopressor currently, refill provided today. He reports no chest pain symptoms.  Still smoking cigarettes, has not been inclined to quit. No further alcohol use.   No Known Allergies  Current Outpatient Prescriptions  Medication Sig Dispense Refill  . colchicine (COLCRYS) 0.6 MG tablet Take 0.6 mg by mouth 2 (two) times daily.        . metoprolol tartrate (LOPRESSOR) 25 MG tablet Take 1 tablet (25 mg total) by mouth 2 (two) times daily.  60 tablet  11  . potassium chloride SA (K-DUR,KLOR-CON) 20 MEQ tablet Take 20 mEq by mouth daily.         No current facility-administered medications for this visit.    Past Medical History  Diagnosis Date  . Essential hypertension, benign   . Gout   . Alcoholism     History of withdrawal and seizures  . Chronic back pain   . Atrial fibrillation     Taken off of Coumadin in 2009 in the setting of GI bleed  . Osteoarthritis   . History of GI bleed   . Internal hemorrhoids     Colonoscopy 11/09  . Schatzki's ring     EGD 11/09  . Colitis     4/11    Social History Mr. Ong reports that he has been smoking Cigarettes.  He has been smoking about 0.00 packs per day. He has never used smokeless tobacco. Mr. Hollett reports that he does not drink alcohol.  Review of Systems Negative except as outlined.  Physical Examination Filed Vitals:   04/18/13 1142  BP: 152/74  Pulse: 63   Filed Weights   04/18/13 1142  Weight: 139 lb (63.05 kg)    Thin male, no acute distress.  HEENT: Conjunctiva and lids normal, oropharynx with poor dentition.  Neck: Supple, no elevated JVP or carotid bruits, no thyromegaly.  Lungs: Clear to auscultation, nonlabored.  Cardiac: RRR,  soft basal systolic murmur, preserved second heart sound, no rub or gallop.  Abdomen: Soft, nontender, bowel sounds present.  Skin: Warm and dry.  Extremities: No pitting edema, distal pulses 1-2+.    Problem List and Plan   Atrial fibrillation Currently in sinus rhythm. Was unable to be anticoagulated due to significant GI bleeding in the past. Have recommended a coated aspirin if tolerated. Otherwise continue Lopressor. Followup arranged.  HYPERTENSION Blood pressure is elevated today. He has been out of Lopressor. Refill provided.  Active smoker Smoking cessation has been discussed. He has not been inclined to quit.    Jonelle Sidle, M.D., F.A.C.C.

## 2013-04-18 NOTE — Assessment & Plan Note (Signed)
Currently in sinus rhythm. Was unable to be anticoagulated due to significant GI bleeding in the past. Have recommended a coated aspirin if tolerated. Otherwise continue Lopressor. Followup arranged.

## 2013-06-04 ENCOUNTER — Encounter (INDEPENDENT_AMBULATORY_CARE_PROVIDER_SITE_OTHER): Payer: Self-pay | Admitting: *Deleted

## 2014-04-22 ENCOUNTER — Encounter: Payer: Self-pay | Admitting: Cardiology

## 2014-04-22 ENCOUNTER — Other Ambulatory Visit: Payer: Self-pay | Admitting: Cardiology

## 2014-04-22 ENCOUNTER — Ambulatory Visit (INDEPENDENT_AMBULATORY_CARE_PROVIDER_SITE_OTHER): Payer: Self-pay | Admitting: Cardiology

## 2014-04-22 VITALS — BP 168/88 | HR 58 | Ht 68.0 in | Wt 141.0 lb

## 2014-04-22 DIAGNOSIS — I10 Essential (primary) hypertension: Secondary | ICD-10-CM

## 2014-04-22 DIAGNOSIS — I482 Chronic atrial fibrillation, unspecified: Secondary | ICD-10-CM

## 2014-04-22 DIAGNOSIS — F17200 Nicotine dependence, unspecified, uncomplicated: Secondary | ICD-10-CM

## 2014-04-22 DIAGNOSIS — Z720 Tobacco use: Secondary | ICD-10-CM

## 2014-04-22 NOTE — Assessment & Plan Note (Signed)
Blood pressure elevated today. He reports compliance with Lopressor. I asked him to make a follow-up visit with [facility] for a physical, 2 weeks prior to this record his home blood pressure each day. May need to consider adding another agent such as an ACE inhibitor or Norvasc.

## 2014-04-22 NOTE — Assessment & Plan Note (Signed)
We discussed smoking cessation strategies today.

## 2014-04-22 NOTE — Progress Notes (Signed)
   Reason for visit: Atrial fibrillation, hypertension  Clinical Summary Todd [name] is a 63 y.o.male last seen in October 2014. He presents for a routine visit. States that he has done very well over the last year, no angina, no significant palpitations. He is active taking care of 3 grandchildren. Continues to take Lopressor, has not been checking his blood pressure as regularly at home. No interval physical with [facility].  ECG today shows sinus rhythm with left atrial enlargement.  Still trying to quit smoking cigarettes. States that this has been harder than it was to quit drinking alcohol previously. We talked about smoking cessation strategies.   No Known Allergies  Current Outpatient Prescriptions  Medication Sig Dispense Refill  . metoprolol tartrate (LOPRESSOR) 25 MG tablet Take 1 tablet (25 mg total) by mouth 2 (two) times daily.  60 tablet  11   No current facility-administered medications for this visit.    Past Medical History  Diagnosis Date  . Essential hypertension, benign   . Gout   . Alcoholism     History of withdrawal and seizures  . Chronic back pain   . Atrial fibrillation     Taken off of Coumadin in 2009 in the setting of GI bleed  . Osteoarthritis   . History of GI bleed   . Internal hemorrhoids     Colonoscopy 11/09  . Schatzki's ring     EGD 11/09  . Colitis     4/11    Social History Todd [name] reports that he has been smoking Cigarettes.  He has been smoking about 0.00 packs per day. He has never used smokeless tobacco. Todd [name] reports that he does not drink alcohol.  Review of Systems Complete review of systems negative except as otherwise outlined in the clinical summary and also the following. Reports no unusual weight change, no melena or hematochezia. Good appetite.  Physical Examination Filed Vitals:   04/22/14 0953  BP: 168/88  Pulse: 58   Filed Weights   04/22/14 0953  Weight: 141 lb (63.957 kg)    Thin male, no acute  distress.  HEENT: Conjunctiva and lids normal, oropharynx with poor dentition.  Neck: Supple, no elevated JVP or carotid bruits, no thyromegaly.  Lungs: Clear to auscultation, nonlabored.  Cardiac: RRR, soft basal systolic murmur, preserved second heart sound, no rub or gallop.  Abdomen: Soft, nontender, bowel sounds present.  Skin: Warm and dry.  Extremities: No pitting edema, distal pulses 1-2+.    Problem List and Plan   Atrial fibrillation Patient in sinus rhythm today. Plan to continue beta blocker. Has prior history of GI bleeding on anticoagulation.  Essential hypertension Blood pressure elevated today. He reports compliance with Lopressor. I asked him to make a follow-up visit with [facility] for a physical, 2 weeks prior to this record his home blood pressure each day. May need to consider adding another agent such as an ACE inhibitor or Norvasc.  Current smoker We discussed smoking cessation strategies today.    Satira Sark, M.D., F.A.C.C.

## 2014-04-22 NOTE — Patient Instructions (Signed)
Your physician wants you to follow-up in: 1 year with Dr. Ferne Reus will receive a reminder letter in the mail two months in advance. If you don't receive a letter, please call our office to schedule the follow-up appointment.   Your physician recommends that you continue on your current medications as directed. Please refer to the Current Medication list given to you today.  Please make appointment with Dr. Gerarda Fraction  Thank you for choosing [facility] !

## 2014-04-22 NOTE — Assessment & Plan Note (Signed)
Patient in sinus rhythm today. Plan to continue beta blocker. Has prior history of GI bleeding on anticoagulation.

## 2014-12-09 ENCOUNTER — Other Ambulatory Visit: Payer: Self-pay | Admitting: Cardiology

## 2014-12-09 NOTE — Telephone Encounter (Signed)
Refill complete 

## 2015-04-22 ENCOUNTER — Ambulatory Visit (INDEPENDENT_AMBULATORY_CARE_PROVIDER_SITE_OTHER): Payer: Self-pay | Admitting: Cardiology

## 2015-04-22 ENCOUNTER — Encounter: Payer: Self-pay | Admitting: Cardiology

## 2015-04-22 VITALS — BP 144/80 | HR 64 | Ht 68.0 in | Wt 138.6 lb

## 2015-04-22 DIAGNOSIS — I480 Paroxysmal atrial fibrillation: Secondary | ICD-10-CM

## 2015-04-22 DIAGNOSIS — I10 Essential (primary) hypertension: Secondary | ICD-10-CM

## 2015-04-22 DIAGNOSIS — Z136 Encounter for screening for cardiovascular disorders: Secondary | ICD-10-CM

## 2015-04-22 NOTE — Progress Notes (Signed)
Cardiology Office Note  Date: 04/22/2015   ID: Todd [name], DOB [birth year], MRN [id number]  PCP: Glo Herring., MD  Primary Cardiologist: Rozann Lesches, MD   Chief Complaint  Patient presents with  . Atrial Fibrillation  . Hypertension    History of Present Illness: Todd [name] is a 64 y.o. male last seen in October 2015. He presents for a routine follow-up visit. Over the last year he has not had any significant palpitations. Remains functional in his ADLs including working outdoors, raising a garden, also playing outside with his 4-year-old grandson. He does not indicate any major functional limitation related to shortness of breath, and has had no exertional chest pain.  Follow-up ECG today shows sinus bradycardia at 59 bpm with left atrial enlargement and R' in lead V1 and V2 with nonspecific ST changes.  His blood pressure looks better controlled today. He is now on Norvasc which was added by Dr. Gerarda Fraction. He is tolerating this well. He reports having had lab work earlier this year with a physical.   Past Medical History  Diagnosis Date  . Essential hypertension, benign   . Gout   . Alcoholism ([place])     History of withdrawal and seizures  . Chronic back pain   . Atrial fibrillation ([place])     Taken off of Coumadin in 2009 in the setting of GI bleed  . Osteoarthritis   . History of GI bleed   . Internal hemorrhoids     Colonoscopy 11/09  . Schatzki's ring     EGD 11/09  . Colitis     4/11    Current Outpatient Prescriptions  Medication Sig Dispense Refill  . amLODipine (NORVASC) 5 MG tablet Take 5 mg by mouth daily.    . metoprolol tartrate (LOPRESSOR) 25 MG tablet TAKE ONE TABLET TWICE DAILY. 60 tablet 6   No current facility-administered medications for this visit.    Allergies:  Review of patient's allergies indicates no known allergies.   Social History: The patient  reports that he has been smoking Cigarettes.  He has never used  smokeless tobacco. He reports that he does not drink alcohol or use illicit drugs.   ROS:  Please see the history of present illness. Otherwise, complete review of systems is positive for none.  All other systems are reviewed and negative.   Physical Exam: VS:  BP 144/80 mmHg  Pulse 64  Ht 5\' 8"  (1.727 m)  Wt 138 lb 9.6 oz (62.869 kg)  BMI 21.08 kg/m2  SpO2 99%, BMI Body mass index is 21.08 kg/(m^2).  Wt Readings from Last 3 Encounters:  04/22/15 138 lb 9.6 oz (62.869 kg)  04/22/14 141 lb (63.957 kg)  04/18/13 139 lb (63.05 kg)     Thin male, no acute distress.  HEENT: Conjunctiva and lids normal, oropharynx with poor dentition.  Neck: Supple, no elevated JVP or carotid bruits, no thyromegaly.  Lungs: Clear to auscultation, nonlabored.  Cardiac: RRR, soft basal systolic murmur, preserved second heart sound, no rub or gallop.  Abdomen: Soft, nontender, bowel sounds present.  Skin: Warm and dry.  Extremities: No pitting edema, distal pulses 1-2+.    ECG: ECG is ordered today.  Other Studies Reviewed Today:  Echocardiogram 12/03/2010 reported mild LVH with LVEF 78%, slight diastolic flattening of the LV septum, mitral systolic bowing without prolapse, mild biatrial enlargement, no ASD or PFO.  ASSESSMENT AND PLAN:  1. History of paroxysmal atrial fibrillation, no documented arrhythmia for quite some  time. CHADSVASC score is 1. He is not anticoagulated with previous history of GI bleeding on Coumadin.  2. Essential hypertension. Blood pressure control is better, he is now on Lopressor and Norvasc. Keep follow-up with Dr. Gerarda Fraction. We are requesting his lab work from visit earlier this year.  Current medicines were reviewed at length with the patient today.   Orders Placed This Encounter  Procedures  . EKG 12-Lead    Disposition: FU with me in 1 year.   Signed, Satira Sark, MD, [facility] 04/22/2015 8:49 AM    [facility] at [place]. [address] [place], [place] [address] Phone: [phone]; Fax: [phone]

## 2015-04-22 NOTE — Patient Instructions (Signed)
Your physician wants you to follow-up in: [place]. You will receive a reminder letter in the mail two months in advance. If you don't receive a letter, please call our office to schedule the follow-up appointment.  If you need a refill on your cardiac medications before your next appointment, please call your pharmacy.  Your physician recommends that you continue on your current medications as directed. Please refer to the Current Medication list given to you today.  I  WILL REQUEST A COPY OF YOUR LAB WORK FROM DR. FUSCO  Thanks for choosing [facility]!!!

## 2015-06-27 ENCOUNTER — Other Ambulatory Visit: Payer: Self-pay | Admitting: Cardiology

## 2015-10-28 ENCOUNTER — Other Ambulatory Visit: Payer: Self-pay | Admitting: Cardiology

## 2016-01-18 DIAGNOSIS — Z1283 Encounter for screening for malignant neoplasm of skin: Secondary | ICD-10-CM | POA: Diagnosis not present

## 2016-01-18 DIAGNOSIS — L308 Other specified dermatitis: Secondary | ICD-10-CM | POA: Diagnosis not present

## 2016-01-18 DIAGNOSIS — D225 Melanocytic nevi of trunk: Secondary | ICD-10-CM | POA: Diagnosis not present

## 2016-04-20 NOTE — Progress Notes (Signed)
Cardiology Office Note  Date: 04/21/2016   ID: Todd [name], DOB [birth year], MRN [id number]  PCP: Glo Herring., MD  Primary Cardiologist: Rozann Lesches, MD   Chief Complaint  Patient presents with  . Atrial Fibrillation    History of Present Illness: Todd [name] is a 65 y.o. male last seen in October 2016. He presents for a routine follow-up visit. Reports no palpitations or chest pain, no decline in stamina. He is raising 2 grandchildren, states that this keeps him busy and he has been very happy with that.  He is not anticoagulated with prior history of GI bleeding on Coumadin. CHADSVASC score is 2.  I reviewed his ECG today which shows rate-controlled atrial fibrillation with R' in lead V1 and poor R-wave progression.  He continues to follow with Dr. Gerarda Fraction for primary care.  Past Medical History:  Diagnosis Date  . Alcoholism ([place])    History of withdrawal and seizures  . Atrial fibrillation ([place])    Taken off of Coumadin in 2009 in the setting of GI bleed  . Chronic back pain   . Colitis    4/11  . Essential hypertension, benign   . Gout   . History of GI bleed   . Internal hemorrhoids    Colonoscopy 11/09  . Osteoarthritis   . Schatzki's ring    EGD 11/09    Current Outpatient Prescriptions  Medication Sig Dispense Refill  . amLODipine (NORVASC) 5 MG tablet Take 5 mg by mouth daily.    . Ascorbic Acid (VITAMIN C) 100 MG tablet Take 100 mg by mouth daily.    . metoprolol tartrate (LOPRESSOR) 25 MG tablet TAKE ONE TABLET TWICE DAILY. 180 tablet 3  . Omega-3 Fatty Acids (FISH OIL) 1000 MG CAPS Take by mouth.     No current facility-administered medications for this visit.    Allergies:  Review of patient's allergies indicates no known allergies.   Social History: The patient  reports that he has been smoking Cigarettes.  He has never used smokeless tobacco. He reports that he does not drink alcohol or use drugs.   ROS:  Please see the  history of present illness. Otherwise, complete review of systems is positive for none.  All other systems are reviewed and negative.   Physical Exam: VS:  BP 126/68   Pulse 96   Ht 5\' 8"  (1.727 m)   Wt 130 lb (59 kg)   SpO2 98%   BMI 19.77 kg/m , BMI Body mass index is 19.77 kg/m.  Wt Readings from Last 3 Encounters:  04/21/16 130 lb (59 kg)  04/22/15 138 lb 9.6 oz (62.9 kg)  04/22/14 141 lb (64 kg)    Thin male, no acute distress.  HEENT: Conjunctiva and lids normal, oropharynx with poor dentition.  Neck: Supple, no elevated JVP or carotid bruits, no thyromegaly.  Lungs: Clear to auscultation, nonlabored.  Cardiac: RRR, soft basal systolic murmur, preserved second heart sound, no rub or gallop.  Abdomen: Soft, nontender, bowel sounds present.  Skin: Warm and dry.  Extremities: No pitting edema, distal pulses 1-2+.   ECG: I personally reviewed the tracing from 04/22/2015 which showed sinus bradycardia with left atrial enlargement, R' in V1 and V2, nonspecific ST changes.  Recent Labwork:  March 2016: Hemoglobin 13.5, platelets 184, BUN 14, creatinine 1.2, potassium 4.5, AST 15, ALT 4, cholesterol 157, triglycerides 58, HDL 41, LDL 104, TSH 2.3  Other Studies Reviewed Today:  Echocardiogram 12/03/2010: Mild LVH with  LVEF [id number], slight diastolic flattening of the LV septum, mitral systolic bowing without prolapse, mild biatrial enlargement, no ASD or PFO.  Assessment and Plan:  1. Persistent atrial fibrillation, symptomatically stable on medical therapy including Lopressor. He is not anticoagulated with previous history of GI bleeding.  2. Essential hypertension, blood pressure is adequately controlled today.  Current medicines were reviewed with the patient today.   Orders Placed This Encounter  Procedures  . EKG 12-Lead    Disposition: Follow-up with me in one year.  Signed, Satira Sark, MD, [facility] 04/21/2016 11:09 AM    [facility] at [place]. [address], [place], [place] [address] Phone: [phone]; Fax: [phone]

## 2016-04-21 ENCOUNTER — Encounter: Payer: Self-pay | Admitting: Cardiology

## 2016-04-21 ENCOUNTER — Ambulatory Visit (INDEPENDENT_AMBULATORY_CARE_PROVIDER_SITE_OTHER): Payer: PPO | Admitting: Cardiology

## 2016-04-21 VITALS — BP 126/68 | HR 96 | Ht 68.0 in | Wt 130.0 lb

## 2016-04-21 DIAGNOSIS — I481 Persistent atrial fibrillation: Secondary | ICD-10-CM | POA: Diagnosis not present

## 2016-04-21 DIAGNOSIS — I10 Essential (primary) hypertension: Secondary | ICD-10-CM | POA: Diagnosis not present

## 2016-04-21 DIAGNOSIS — I4819 Other persistent atrial fibrillation: Secondary | ICD-10-CM

## 2016-04-21 NOTE — Patient Instructions (Signed)
Your physician wants you to follow-up in: 1 year You will receive a reminder letter in the mail two months in advance. If you don't receive a letter, please call our office to schedule the follow-up appointment.    Your physician recommends that you continue on your current medications as directed. Please refer to the Current Medication list given to you today.     Thank you for choosing [facility] !  

## 2016-06-23 DIAGNOSIS — Z6820 Body mass index (BMI) 20.0-20.9, adult: Secondary | ICD-10-CM | POA: Diagnosis not present

## 2016-06-23 DIAGNOSIS — Z1389 Encounter for screening for other disorder: Secondary | ICD-10-CM | POA: Diagnosis not present

## 2016-06-23 DIAGNOSIS — H6092 Unspecified otitis externa, left ear: Secondary | ICD-10-CM | POA: Diagnosis not present

## 2016-06-23 DIAGNOSIS — H6692 Otitis media, unspecified, left ear: Secondary | ICD-10-CM | POA: Diagnosis not present

## 2016-08-30 DIAGNOSIS — Z6822 Body mass index (BMI) 22.0-22.9, adult: Secondary | ICD-10-CM | POA: Diagnosis not present

## 2016-08-30 DIAGNOSIS — I4891 Unspecified atrial fibrillation: Secondary | ICD-10-CM | POA: Diagnosis not present

## 2016-08-30 DIAGNOSIS — Z0001 Encounter for general adult medical examination with abnormal findings: Secondary | ICD-10-CM | POA: Diagnosis not present

## 2016-08-30 DIAGNOSIS — N529 Male erectile dysfunction, unspecified: Secondary | ICD-10-CM | POA: Diagnosis not present

## 2016-08-30 DIAGNOSIS — Z1389 Encounter for screening for other disorder: Secondary | ICD-10-CM | POA: Diagnosis not present

## 2016-08-30 DIAGNOSIS — I10 Essential (primary) hypertension: Secondary | ICD-10-CM | POA: Diagnosis not present

## 2016-09-16 ENCOUNTER — Other Ambulatory Visit: Payer: Self-pay | Admitting: Cardiology

## 2017-05-08 NOTE — Progress Notes (Signed)
Cardiology Office Note  Date: 05/09/2017   ID: [name], DOB 1950-09-16, MRN [id number]  PCP: Redmond School, MD  Primary Cardiologist: Rozann Lesches, MD   Chief Complaint  Patient presents with  . Atrial Fibrillation    History of Present Illness: Todd [name] is a 66 y.o. male last seen in October 2017.  He presents for a routine follow-up visit.  Denies any significant sense of palpitations and has had no chest pain.  He continues to spend a lot of time with his grandkids.  He reports NYHA class II dyspnea.  No lightheadedness or syncope.  He states that he had lab work at [facility] which we are requesting for review.  No changes have been made to his medications which are outlined below.  Blood pressure and heart rate are well controlled today.  I personally reviewed his ECG today which showed rate controlled atrial fibrillation with poor R wave progression, rule out old anterior infarct pattern.  Past Medical History:  Diagnosis Date  . Alcoholism ([place])    History of withdrawal and seizures  . Atrial fibrillation ([place])    Taken off of Coumadin in 2009 in the setting of GI bleed  . Chronic back pain   . Colitis    4/11  . Essential hypertension, benign   . Gout   . History of GI bleed   . Internal hemorrhoids    Colonoscopy 11/09  . Osteoarthritis   . Schatzki's ring    EGD 11/09    Past Surgical History:  Procedure Laterality Date  . Excision of gouty lesion     Left index finger  . Right knee arthroscopy    . SPINE SURGERY      Current Outpatient Medications  Medication Sig Dispense Refill  . amLODipine (NORVASC) 5 MG tablet Take 5 mg by mouth daily.    . Ascorbic Acid (VITAMIN C) 100 MG tablet Take 100 mg by mouth daily.    . metoprolol tartrate (LOPRESSOR) 25 MG tablet TAKE ONE TABLET TWICE DAILY. 180 tablet 3  . Omega-3 Fatty Acids (FISH OIL) 1000 MG CAPS Take by mouth.     No current facility-administered medications for this  visit.    Allergies:  Patient has no known allergies.   Social History: The patient  reports that he has been smoking cigarettes.  he has never used smokeless tobacco. He reports that he does not drink alcohol or use drugs.   ROS:  Please see the history of present illness. Otherwise, complete review of systems is positive for none.  All other systems are reviewed and negative.   Physical Exam: VS:  BP 104/60   Pulse 68   Ht 5\' 8"  (1.727 m)   Wt 126 lb (57.2 kg)   SpO2 94%   BMI 19.16 kg/m , BMI Body mass index is 19.16 kg/m.  Wt Readings from Last 3 Encounters:  05/09/17 126 lb (57.2 kg)  04/21/16 130 lb (59 kg)  04/22/15 138 lb 9.6 oz (62.9 kg)    General: Patient appears comfortable at rest. HEENT: Conjunctiva and lids normal, oropharynx clear with poor dentition. Neck: Supple, no elevated JVP or carotid bruits, no thyromegaly. Lungs: Clear to auscultation, nonlabored breathing at rest. Cardiac: Irregularly irregular, no S3, soft systolic murmur, no pericardial rub. Abdomen: Soft, nontender, bowel sounds present, no guarding or rebound. Extremities: No pitting edema, distal pulses 2+. Skin: Warm and dry. Musculoskeletal: No kyphosis. Neuropsychiatric: Alert and oriented x3, affect grossly appropriate.  ECG: I personally reviewed the tracing from 04/21/2016 which showed rate controlled atrial fibrillation with R' in lead V1 and poor R wave progression.  Recent Labwork:  March 2016: Hemoglobin 13.5, platelets 184, BUN 14, creatinine 1.2, potassium 4.5, AST 15, ALT 4, cholesterol 157, triglycerides 58, HDL 41, LDL 104, TSH 2.3  Other Studies Reviewed Today:  Echocardiogram 12/03/2010: Mild LVH with LVEF 72%, slight diastolic flattening of the LV septum, mitral systolic bowing without prolapse, mild biatrial enlargement, no ASD or PFO.  Assessment and Plan:  1.  Persistent atrial fibrillation, heart rate well controlled on Lopressor.  He is not anticoagulated with previous  history of GI bleeding.  Reports no palpitations and we will continue with observation for now.  2.  Essential hypertension, blood pressure is well controlled today.  3.  Long-standing tobacco abuse.  Smoking cessation has been discussed, he has not been able to quit.  4.  Alcohol abuse in remission.  Current medicines were reviewed with the patient today.   Orders Placed This Encounter  Procedures  . EKG 12-Lead    Disposition:  Signed, Satira Sark, MD, [facility] 05/09/2017 8:53 AM    [facility] [place]. [address] [place], [place] [address] Phone: [phone]; Fax: [phone]

## 2017-05-09 ENCOUNTER — Encounter: Payer: Self-pay | Admitting: Cardiology

## 2017-05-09 ENCOUNTER — Ambulatory Visit: Payer: PPO | Admitting: Cardiology

## 2017-05-09 VITALS — BP 104/60 | HR 68 | Ht 68.0 in | Wt 126.0 lb

## 2017-05-09 DIAGNOSIS — F1011 Alcohol abuse, in remission: Secondary | ICD-10-CM

## 2017-05-09 DIAGNOSIS — I481 Persistent atrial fibrillation: Secondary | ICD-10-CM | POA: Diagnosis not present

## 2017-05-09 DIAGNOSIS — I4819 Other persistent atrial fibrillation: Secondary | ICD-10-CM

## 2017-05-09 DIAGNOSIS — Z720 Tobacco use: Secondary | ICD-10-CM

## 2017-05-09 DIAGNOSIS — I10 Essential (primary) hypertension: Secondary | ICD-10-CM

## 2017-05-09 NOTE — Patient Instructions (Signed)
Your physician wants you to follow-up in: 1 year with Dr.McDowell You will receive a reminder letter in the mail two months in advance. If you don't receive a letter, please call our office to schedule the follow-up appointment.    Your physician recommends that you continue on your current medications as directed. Please refer to the Current Medication list given to you today.    If you need a refill on your cardiac medications before your next appointment, please call your pharmacy.     No lab work or tests ordered today.      Thank you for choosing  [facility] !        

## 2017-08-15 DIAGNOSIS — Z961 Presence of intraocular lens: Secondary | ICD-10-CM | POA: Diagnosis not present

## 2017-12-26 DIAGNOSIS — I4891 Unspecified atrial fibrillation: Secondary | ICD-10-CM | POA: Diagnosis not present

## 2017-12-26 DIAGNOSIS — N529 Male erectile dysfunction, unspecified: Secondary | ICD-10-CM | POA: Diagnosis not present

## 2017-12-26 DIAGNOSIS — Z1389 Encounter for screening for other disorder: Secondary | ICD-10-CM | POA: Diagnosis not present

## 2017-12-26 DIAGNOSIS — Z681 Body mass index (BMI) 19 or less, adult: Secondary | ICD-10-CM | POA: Diagnosis not present

## 2017-12-26 DIAGNOSIS — E782 Mixed hyperlipidemia: Secondary | ICD-10-CM | POA: Diagnosis not present

## 2017-12-26 DIAGNOSIS — Z0001 Encounter for general adult medical examination with abnormal findings: Secondary | ICD-10-CM | POA: Diagnosis not present

## 2018-01-16 DIAGNOSIS — E871 Hypo-osmolality and hyponatremia: Secondary | ICD-10-CM | POA: Diagnosis not present

## 2018-01-25 DIAGNOSIS — R944 Abnormal results of kidney function studies: Secondary | ICD-10-CM | POA: Diagnosis not present

## 2018-05-11 NOTE — Progress Notes (Deleted)
Cardiology Office Note  Date: 05/11/2018   ID: Todd [name], DOB March 16, 1951, MRN [id number]  PCP: Redmond School, MD  Primary Cardiologist: Rozann Lesches, MD   No chief complaint on file.   History of Present Illness: Todd [name] is a 67 y.o. male last seen in November 2018.  Past Medical History:  Diagnosis Date  . Alcoholism ([place])    History of withdrawal and seizures  . Atrial fibrillation ([place])    Taken off of Coumadin in 2009 in the setting of GI bleed  . Chronic back pain   . Colitis    4/11  . Essential hypertension, benign   . Gout   . History of GI bleed   . Internal hemorrhoids    Colonoscopy 11/09  . Osteoarthritis   . Schatzki's ring    EGD 11/09    Past Surgical History:  Procedure Laterality Date  . Excision of gouty lesion     Left index finger  . Right knee arthroscopy    . SPINE SURGERY      Current Outpatient Medications  Medication Sig Dispense Refill  . amLODipine (NORVASC) 5 MG tablet Take 5 mg by mouth daily.    . Ascorbic Acid (VITAMIN C) 100 MG tablet Take 100 mg by mouth daily.    . metoprolol tartrate (LOPRESSOR) 25 MG tablet TAKE ONE TABLET TWICE DAILY. 180 tablet 3  . Omega-3 Fatty Acids (FISH OIL) 1000 MG CAPS Take by mouth.     No current facility-administered medications for this visit.    Allergies:  Patient has no known allergies.   Social History: The patient  reports that he has been smoking cigarettes. He has never used smokeless tobacco. He reports that he does not drink alcohol or use drugs.   Family History: The patient's family history includes Coronary artery disease in his father and sister.   ROS:  Please see the history of present illness. Otherwise, complete review of systems is positive for {NONE DEFAULTED:18576::"none"}.  All other systems are reviewed and negative.   Physical Exam: VS:  There were no vitals taken for this visit., BMI There is no height or weight on file to calculate  BMI.  Wt Readings from Last 3 Encounters:  05/09/17 126 lb (57.2 kg)  04/21/16 130 lb (59 kg)  04/22/15 138 lb 9.6 oz (62.9 kg)    General: Patient appears comfortable at rest. HEENT: Conjunctiva and lids normal, oropharynx clear with moist mucosa. Neck: Supple, no elevated JVP or carotid bruits, no thyromegaly. Lungs: Clear to auscultation, nonlabored breathing at rest. Cardiac: Regular rate and rhythm, no S3 or significant systolic murmur, no pericardial rub. Abdomen: Soft, nontender, no hepatomegaly, bowel sounds present, no guarding or rebound. Extremities: No pitting edema, distal pulses 2+. Skin: Warm and dry. Musculoskeletal: No kyphosis. Neuropsychiatric: Alert and oriented x3, affect grossly appropriate.  ECG: I personally reviewed the tracing from 05/09/2017 which showed rate controlled atrial fibrillation with poor R wave progression, rule out old anterior infarct pattern.  Recent Labwork:  March 2016: Hemoglobin 13.5, platelets 184, BUN 14, creatinine 1.2, potassium 4.5, AST 15, ALT 4, cholesterol 157, triglycerides 58, HDL 41, LDL 104, TSH 2.3  Other Studies Reviewed Today:  Echocardiogram 12/03/2010: Mild LVH with LVEF 50%, slight diastolic flattening of the LV septum, mitral systolic bowing without prolapse, mild biatrial enlargement, no ASD or PFO.  Assessment and Plan:   Current medicines were reviewed with the patient today.  No orders of the defined types  were placed in this encounter.   Disposition:  Signed, Satira Sark, MD, [facility] 05/11/2018 10:56 AM    [facility] [place]. [address] [place], [place] [address] Phone: [phone]; Fax: [phone]

## 2018-05-14 ENCOUNTER — Ambulatory Visit: Payer: PPO | Admitting: Cardiology

## 2018-05-15 ENCOUNTER — Ambulatory Visit: Payer: PPO | Admitting: Student

## 2018-05-15 ENCOUNTER — Encounter: Payer: Self-pay | Admitting: *Deleted

## 2018-05-15 ENCOUNTER — Encounter: Payer: Self-pay | Admitting: Student

## 2018-05-15 VITALS — BP 118/78 | HR 76 | Ht 69.0 in | Wt 130.0 lb

## 2018-05-15 DIAGNOSIS — Z720 Tobacco use: Secondary | ICD-10-CM

## 2018-05-15 DIAGNOSIS — I10 Essential (primary) hypertension: Secondary | ICD-10-CM

## 2018-05-15 DIAGNOSIS — I4819 Other persistent atrial fibrillation: Secondary | ICD-10-CM

## 2018-05-15 NOTE — Progress Notes (Signed)
Cardiology Office Note    Date:  05/15/2018   ID:  [name], DOB 02-Apr-1951, MRN [id number]  PCP:  Redmond School, MD  Cardiologist: Rozann Lesches, MD    Chief Complaint  Patient presents with  . Follow-up    Annual Visit    History of Present Illness:    Todd [name] is a 67 y.o. male with past medical history of persistent atrial fibrillation (no longer on anticoagulation given history of GIB), HTN, and tobacco use who presents to the office today for annual follow-up.   He was last examined by Dr. Domenic Polite in 04/2017 and denied any recent palpitations or dyspnea at that time. Repeat EKG showed rate controlled atrial fibrillation and he was continued on Lopressor 25 mg twice daily for rate-control along with Amlodipine 5mg  daily for HTN.   In talking with the patient today, he reports doing well from a cardiac perspective since his last office visit.  He denies any recent chest pain, palpitations, dyspnea on exertion, orthopnea, PND, or lower extremity edema.  He does not exercise regularly but reports being active at baseline as his 3 grandchildren currently live with him and he typically plays baseball or goes fishing with his grandson. Denies any anginal symptoms when performing these activities.  He does check his HR and BP in the ambulatory setting reports these are typically well-controlled.   Past Medical History:  Diagnosis Date  . Alcoholism ([place])    History of withdrawal and seizures  . Atrial fibrillation ([place])    Taken off of Coumadin in 2009 in the setting of GI bleed  . Chronic back pain   . Colitis    4/11  . Essential hypertension, benign   . Gout   . History of GI bleed   . Internal hemorrhoids    Colonoscopy 11/09  . Osteoarthritis   . Schatzki's ring    EGD 11/09    Past Surgical History:  Procedure Laterality Date  . Excision of gouty lesion     Left index finger  . Right knee arthroscopy    . SPINE SURGERY       Current Medications: Outpatient Medications Prior to Visit  Medication Sig Dispense Refill  . amLODipine (NORVASC) 5 MG tablet Take 5 mg by mouth daily.    . Ascorbic Acid (VITAMIN C) 100 MG tablet Take 100 mg by mouth daily.    . metoprolol tartrate (LOPRESSOR) 25 MG tablet TAKE ONE TABLET TWICE DAILY. 180 tablet 3  . Omega-3 Fatty Acids (FISH OIL) 1000 MG CAPS Take by mouth.     No facility-administered medications prior to visit.      Allergies:   Patient has no known allergies.   Social History   Socioeconomic History  . Marital status: Married    Spouse name: Not on file  . Number of children: Not on file  . Years of education: Not on file  . Highest education level: Not on file  Occupational History  . Occupation: Retired    Fish farm manager: [employer]  Social Needs  . Financial resource strain: Not on file  . Food insecurity:    Worry: Not on file    Inability: Not on file  . Transportation needs:    Medical: Not on file    Non-medical: Not on file  Tobacco Use  . Smoking status: Current Every Day Smoker    Types: Cigarettes  . Smokeless tobacco: Never Used  Substance and Sexual Activity  . Alcohol  use: No    Alcohol/week: 0.0 standard drinks    Comment: History of heavy alcohol use, most recent quit attempt has lasted 3 months  . Drug use: No  . Sexual activity: Never  Lifestyle  . Physical activity:    Days per week: Not on file    Minutes per session: Not on file  . Stress: Not on file  Relationships  . Social connections:    Talks on phone: Not on file    Gets together: Not on file    Attends religious service: Not on file    Active member of club or organization: Not on file    Attends meetings of clubs or organizations: Not on file    Relationship status: Not on file  Other Topics Concern  . Not on file  Social History Narrative  . Not on file     Family History:  The patient's family history includes Coronary artery disease in his  father and sister.   Review of Systems:   Please see the history of present illness.     General:  No chills, fever, night sweats or weight changes.  Cardiovascular:  No chest pain, dyspnea on exertion, edema, orthopnea, palpitations, paroxysmal nocturnal dyspnea.  Dermatological: No rash, lesions/masses Respiratory: No cough, dyspnea Urologic: No hematuria, dysuria Abdominal:   No nausea, vomiting, diarrhea, bright red blood per rectum, melena, or hematemesis Neurologic:  No visual changes, wkns, changes in mental status.  He denies any of the above symptoms.   All other systems reviewed and are otherwise negative except as noted above.   Physical Exam:    VS:  BP 118/78   Pulse 76   Ht 5\' 9"  (1.753 m)   Wt 130 lb (59 kg)   BMI 19.20 kg/m    General: Well developed, well nourished Caucasian male appearing in no acute distress. Head: Normocephalic, atraumatic, sclera non-icteric, no xanthomas, nares are without discharge.  Neck: No carotid bruits. JVD not elevated.  Lungs: Respirations regular and unlabored, without wheezes or rales.  Heart: Irregularly irregular. No S3 or S4.  No murmur, no rubs, or gallops appreciated. Abdomen: Soft, non-tender, non-distended with normoactive bowel sounds. No hepatomegaly. No rebound/guarding. No obvious abdominal masses. Msk:  Strength and tone appear normal for age. No joint deformities or effusions. Extremities: No clubbing or cyanosis. No lower extremity edema.  Distal pedal pulses are 2+ bilaterally. Neuro: Alert and oriented X 3. Moves all extremities spontaneously. No focal deficits noted. Psych:  Responds to questions appropriately with a normal affect. Skin: No rashes or lesions noted  Wt Readings from Last 3 Encounters:  05/15/18 130 lb (59 kg)  05/09/17 126 lb (57.2 kg)  04/21/16 130 lb (59 kg)     Studies/Labs Reviewed:   EKG:  EKG is ordered today.  The ekg ordered today demonstrates rate-controlled atrial fibrillation, HR  76. Right axis deviation. No acute ST changes when compared to prior tracings.   Recent Labs: No results found for requested labs within last 8760 hours.   Lipid Panel    Component Value Date/Time   CHOL  07/04/2007 0435    138        ATP [name] CLASSIFICATION:  <200     mg/dL   Desirable  200-239  mg/dL   Borderline High  >=240    mg/dL   High          TRIG 86 07/04/2007 0435   HDL 27 (L) 07/04/2007 0435   CHOLHDL 5.1  07/04/2007 0435   VLDL 17 07/04/2007 0435   LDLCALC  07/04/2007 0435    94        Total Cholesterol/HDL:CHD Risk Coronary Heart Disease Risk Table                     Men   Women  1/2 Average Risk   3.4   3.3  Average Risk       5.0   4.4  2 X Average Risk   9.6   7.1  3 X Average Risk  23.4   11.0        Use the calculated Patient Ratio above and the CHD Risk Table to determine the patient's CHD Risk.        ATP [name] CLASSIFICATION (LDL):  <100     mg/dL   Optimal  100-129  mg/dL   Near or Above                    Optimal  130-159  mg/dL   Borderline  160-189  mg/dL   High  >190     mg/dL   Very High    Additional studies/ records that were reviewed today include:   No prior cardiovascular testing on file.   CXR: 09/2010 Comparison: None.  Findings: The lungs are well-aerated.  Mild vascular congestion is noted.  Mild peribronchial thickening is seen.  There is no evidence of focal opacification, pleural effusion or pneumothorax.  The heart is normal in size; calcification is noted within the aortic arch.  No acute osseous abnormalities are seen.  IMPRESSION:  1.  Mild peribronchial thickening noted. 2.  Mild vascular congestion noted; lungs remain clear.  Assessment:    1. Persistent atrial fibrillation   2. Essential hypertension   3. Tobacco abuse      Plan:   In order of problems listed above:  1. Persistent Atrial Fibrillation - He denies any recent palpitations or dyspnea on exertion and heart rate is well controlled in  the 70's during today's visit. He reports similar readings when checked at home. Will continue on Lopressor 25 mg twice daily for rate control. - This patients CHA2DS2-VASc Score and unadjusted Ischemic Stroke Rate (% per year) is equal to 2.2 % stroke rate/year from a score of 2 (HTN, Age). He previously had a GIB while on Coumadin. Was encouraged to take ASA in the past but did not tolerate this as well. Reviewed the possible use of DOAC's as his GIB occurred while on Coumadin but he does not wish to be on anticoagulation. Aware of the increased risk of a thromboembolic event including a CVA.   2. HTN - BP is well controlled at 118/78 during today's visit. - Continue Amlodipine 5 mg daily and Lopressor 25 mg twice daily.  3. Tobacco Use - he continues to smoke 1.0 ppd. Cessation advised but he has no intention of quitting at this time.    Medication Adjustments/Labs and Tests Ordered: Current medicines are reviewed at length with the patient today.  Concerns regarding medicines are outlined above.  Medication changes, Labs and Tests ordered today are listed in the Patient Instructions below. Patient Instructions  Medication Instructions:  Your physician recommends that you continue on your current medications as directed. Please refer to the Current Medication list given to you today.  If you need a refill on your cardiac medications before your next appointment, please call your pharmacy.   Lab work: [employer]  If you have labs (blood work) drawn today and your tests are completely normal, you will receive your results only by: Marland Kitchen MyChart Message (if you have MyChart) OR . A paper copy in the mail If you have any lab test that is abnormal or we need to change your treatment, we will call you to review the results.  Testing/Procedures: NONE   Follow-Up: At [facility] you and your health needs are our priority.  As part of our continuing mission to provide you with exceptional heart  care, we have created designated Provider Care Teams.  These Care Teams include your primary Cardiologist (physician) and Advanced Practice Providers (APPs -  Physician Assistants and Nurse Practitioners) who all work together to provide you with the care you need, when you need it. You will need a follow up appointment in 1 years.  Please call our office 2 months in advance to schedule this appointment.  You may see Rozann Lesches, MD or one of the following Advanced Practice Providers on your designated Care Team:   Bernerd Pho, PA-C [facility]) . Ermalinda Barrios, PA-C ([place])  Any Other Special Instructions Will Be Listed Below (If Applicable). Thank you for choosing [facility]!    Signed, Erma Heritage, PA-C  05/15/2018 4:41 PM    [facility] [address] [place], [place] [address] Phone: [phone]

## 2018-05-15 NOTE — Patient Instructions (Signed)
Medication Instructions:  Your physician recommends that you continue on your current medications as directed. Please refer to the Current Medication list given to you today.  If you need a refill on your cardiac medications before your next appointment, please call your pharmacy.   Lab work: NONE  If you have labs (blood work) drawn today and your tests are completely normal, you will receive your results only by: . MyChart Message (if you have MyChart) OR . A paper copy in the mail If you have any lab test that is abnormal or we need to change your treatment, we will call you to review the results.  Testing/Procedures: NONE   Follow-Up: At [facility], you and your health needs are our priority.  As part of our continuing mission to provide you with exceptional heart care, we have created designated Provider Care Teams.  These Care Teams include your primary Cardiologist (physician) and Advanced Practice Providers (APPs -  Physician Assistants and Nurse Practitioners) who all work together to provide you with the care you need, when you need it. You will need a follow up appointment in 1 years.  Please call our office 2 months in advance to schedule this appointment.  You may see Samuel McDowell, MD or one of the following Advanced Practice Providers on your designated Care Team:   Brittany Strader, PA-C ([place] Office) . Michele Lenze, PA-C ([place] Office)  Any Other Special Instructions Will Be Listed Below (If Applicable). Thank you for choosing  [facility]!     

## 2018-07-26 DIAGNOSIS — I4891 Unspecified atrial fibrillation: Secondary | ICD-10-CM | POA: Diagnosis not present

## 2018-07-26 DIAGNOSIS — M109 Gout, unspecified: Secondary | ICD-10-CM | POA: Diagnosis not present

## 2018-07-26 DIAGNOSIS — E538 Deficiency of other specified B group vitamins: Secondary | ICD-10-CM | POA: Diagnosis not present

## 2018-07-26 DIAGNOSIS — N529 Male erectile dysfunction, unspecified: Secondary | ICD-10-CM | POA: Diagnosis not present

## 2018-07-26 DIAGNOSIS — F17290 Nicotine dependence, other tobacco product, uncomplicated: Secondary | ICD-10-CM | POA: Diagnosis not present

## 2018-07-26 DIAGNOSIS — Z719 Counseling, unspecified: Secondary | ICD-10-CM | POA: Diagnosis not present

## 2018-07-26 DIAGNOSIS — I10 Essential (primary) hypertension: Secondary | ICD-10-CM | POA: Diagnosis not present

## 2018-07-26 DIAGNOSIS — H6992 Unspecified Eustachian tube disorder, left ear: Secondary | ICD-10-CM | POA: Diagnosis not present

## 2018-07-26 DIAGNOSIS — Z6820 Body mass index (BMI) 20.0-20.9, adult: Secondary | ICD-10-CM | POA: Diagnosis not present

## 2018-07-26 DIAGNOSIS — Z1389 Encounter for screening for other disorder: Secondary | ICD-10-CM | POA: Diagnosis not present

## 2018-07-26 DIAGNOSIS — Z0001 Encounter for general adult medical examination with abnormal findings: Secondary | ICD-10-CM | POA: Diagnosis not present

## 2018-07-26 DIAGNOSIS — H6502 Acute serous otitis media, left ear: Secondary | ICD-10-CM | POA: Diagnosis not present

## 2018-08-21 DIAGNOSIS — Z961 Presence of intraocular lens: Secondary | ICD-10-CM | POA: Diagnosis not present

## 2018-09-10 DIAGNOSIS — Z1389 Encounter for screening for other disorder: Secondary | ICD-10-CM | POA: Diagnosis not present

## 2018-09-10 DIAGNOSIS — E7849 Other hyperlipidemia: Secondary | ICD-10-CM | POA: Diagnosis not present

## 2018-09-10 DIAGNOSIS — N400 Enlarged prostate without lower urinary tract symptoms: Secondary | ICD-10-CM | POA: Diagnosis not present

## 2018-09-10 DIAGNOSIS — Z6820 Body mass index (BMI) 20.0-20.9, adult: Secondary | ICD-10-CM | POA: Diagnosis not present

## 2018-09-10 DIAGNOSIS — Z0001 Encounter for general adult medical examination with abnormal findings: Secondary | ICD-10-CM | POA: Diagnosis not present

## 2018-09-10 DIAGNOSIS — R7309 Other abnormal glucose: Secondary | ICD-10-CM | POA: Diagnosis not present

## 2019-01-29 ENCOUNTER — Other Ambulatory Visit (HOSPITAL_COMMUNITY): Payer: Self-pay | Admitting: Internal Medicine

## 2019-01-29 ENCOUNTER — Other Ambulatory Visit: Payer: Self-pay | Admitting: Internal Medicine

## 2019-01-29 DIAGNOSIS — I4891 Unspecified atrial fibrillation: Secondary | ICD-10-CM | POA: Diagnosis not present

## 2019-01-29 DIAGNOSIS — M5417 Radiculopathy, lumbosacral region: Secondary | ICD-10-CM | POA: Diagnosis not present

## 2019-01-29 DIAGNOSIS — M79604 Pain in right leg: Secondary | ICD-10-CM | POA: Diagnosis not present

## 2019-01-29 DIAGNOSIS — Z681 Body mass index (BMI) 19 or less, adult: Secondary | ICD-10-CM | POA: Diagnosis not present

## 2019-01-29 DIAGNOSIS — Z79899 Other long term (current) drug therapy: Secondary | ICD-10-CM | POA: Diagnosis not present

## 2019-01-29 DIAGNOSIS — R252 Cramp and spasm: Secondary | ICD-10-CM | POA: Diagnosis not present

## 2019-01-29 DIAGNOSIS — M62831 Muscle spasm of calf: Secondary | ICD-10-CM | POA: Diagnosis not present

## 2019-01-29 DIAGNOSIS — I10 Essential (primary) hypertension: Secondary | ICD-10-CM | POA: Diagnosis not present

## 2019-02-01 ENCOUNTER — Ambulatory Visit (HOSPITAL_COMMUNITY)
Admission: RE | Admit: 2019-02-01 | Discharge: 2019-02-01 | Disposition: A | Discharge status: Home / Self Care | Payer: PPO | Source: Ambulatory Visit | Attending: Internal Medicine | Admitting: Internal Medicine

## 2019-02-01 ENCOUNTER — Other Ambulatory Visit: Payer: Self-pay

## 2019-02-01 DIAGNOSIS — M79604 Pain in right leg: Secondary | ICD-10-CM | POA: Insufficient documentation

## 2019-02-01 DIAGNOSIS — I739 Peripheral vascular disease, unspecified: Secondary | ICD-10-CM | POA: Diagnosis not present

## 2019-02-20 ENCOUNTER — Telehealth (HOSPITAL_COMMUNITY): Payer: Self-pay | Admitting: Rehabilitation

## 2019-02-20 NOTE — Telephone Encounter (Signed)

## 2019-02-21 ENCOUNTER — Encounter: Payer: Self-pay | Admitting: Vascular Surgery

## 2019-02-21 ENCOUNTER — Ambulatory Visit: Payer: PPO | Admitting: Vascular Surgery

## 2019-02-21 ENCOUNTER — Other Ambulatory Visit: Payer: Self-pay

## 2019-02-21 VITALS — BP 120/76 | HR 55 | Temp 97.8°F | Resp 20 | Ht 69.0 in | Wt 119.7 lb

## 2019-02-21 DIAGNOSIS — I739 Peripheral vascular disease, unspecified: Secondary | ICD-10-CM

## 2019-02-21 MED ORDER — ASPIRIN EC 81 MG PO TBEC: 81.0000 mg | DELAYED_RELEASE_TABLET | Freq: Every day | ORAL | 2 refills | Status: DC

## 2019-02-21 MED ORDER — ROSUVASTATIN CALCIUM 10 MG PO TABS: 10.0000 mg | ORAL_TABLET | Freq: Every day | ORAL | 12 refills | Status: DC

## 2019-02-21 NOTE — Progress Notes (Signed)
Referring Physician: Dr. Gerarda Fraction  Patient name: Todd [name] MRN: [id number] DOB: 1950/11/07 Sex: male  REASON FOR CONSULT: Bilateral leg pain  HPI: Todd [name] is a 68 y.o. male, with an 20-month history of bilateral leg pain.  Right leg is much more significant than the left leg.  He develops pain in his right thigh and right calf after walking about 20 yards.  He does have a history of atrial fibrillation.  However, he is not on anticoagulation due to a prior GI bleed about 10 years ago.  He does have some shortness of breath with walking but states that his legs start to report before his shortness of breath currently.  Greater than 3 minutes today spent regarding tobacco cessation counseling.  He currently smokes a little bit less than a half a pack of cigarettes per day.  He does have a history of alcohol abuse.  He has cut back significantly according to him.  He drinks currently less than 5 beers on the Sunday otherwise does not consume alcohol.  He does not describe rest pain in the feet.  He has no nonhealing wounds.  Other medical problems include atrial fibrillation, hypertension all of which are currently stable.  Past Medical History:  Diagnosis Date  . Alcoholism ([name])    History of withdrawal and seizures  . Atrial fibrillation ([name])    Taken off of Coumadin in 2009 in the setting of GI bleed  . Chronic back pain   . Colitis    4/11  . Essential hypertension, benign   . Gout   . History of GI bleed   . Internal hemorrhoids    Colonoscopy 11/09  . Osteoarthritis   . Schatzki's ring    EGD 11/09   Past Surgical History:  Procedure Laterality Date  . Excision of gouty lesion     Left index finger  . Right knee arthroscopy    . SPINE SURGERY      Family History  Problem Relation Age of Onset  . Coronary artery disease Father   . Coronary artery disease Sister        MI at age 48    SOCIAL HISTORY: Social History   Socioeconomic History  .  Marital status: Married    Spouse name: Not on file  . Number of children: Not on file  . Years of education: Not on file  . Highest education level: Not on file  Occupational History  . Occupation: Retired    Fish farm manager: [employer]  Social Needs  . Financial resource strain: Not on file  . Food insecurity    Worry: Not on file    Inability: Not on file  . Transportation needs    Medical: Not on file    Non-medical: Not on file  Tobacco Use  . Smoking status: Current Every Day Smoker    Packs/day: 0.50    Types: Cigarettes  . Smokeless tobacco: Never Used  Substance and Sexual Activity  . Alcohol use: No    Alcohol/week: 0.0 standard drinks    Comment: History of heavy alcohol use, most recent quit attempt has lasted 3 months  . Drug use: No  . Sexual activity: Never  Lifestyle  . Physical activity    Days per week: Not on file    Minutes per session: Not on file  . Stress: Not on file  Relationships  . Social connections    Talks on phone: Not on file  Gets together: Not on file    Attends religious service: Not on file    Active member of club or organization: Not on file    Attends meetings of clubs or organizations: Not on file    Relationship status: Not on file  . Intimate partner violence    Fear of current or ex partner: Not on file    Emotionally abused: Not on file    Physically abused: Not on file    Forced sexual activity: Not on file  Other Topics Concern  . Not on file  Social History Narrative  . Not on file    No Known Allergies  Current Outpatient Medications  Medication Sig Dispense Refill  . amLODipine (NORVASC) 5 MG tablet Take 5 mg by mouth daily.    . Ascorbic Acid (VITAMIN C) 100 MG tablet Take 100 mg by mouth daily.    . metoprolol-hydrochlorothiazide (LOPRESSOR HCT) 50-25 MG tablet Take 1 tablet by mouth daily.     . Omega-3 Fatty Acids (FISH OIL) 1000 MG CAPS Take by mouth.     No current facility-administered medications  for this visit.     ROS:   General:  No weight loss, Fever, chills  HEENT: No recent headaches, no nasal bleeding, no visual changes, no sore throat  Neurologic: No dizziness, blackouts, seizures. No recent symptoms of stroke or mini- stroke. No recent episodes of slurred speech, or temporary blindness.  Cardiac: No recent episodes of chest pain/pressure, no shortness of breath at rest.  + shortness of breath with exertion.  + history of atrial fibrillation or irregular heartbeat  Vascular: No history of rest pain in feet.  No history of claudication.  No history of non-healing ulcer, No history of DVT   Pulmonary: No home oxygen, no productive cough, no hemoptysis,  No asthma or wheezing  Musculoskeletal:  [ ]  Arthritis, [ ]  Low back pain,  [ ]  Joint pain  Hematologic:No history of hypercoagulable state.  No history of easy bleeding.  No history of anemia  Gastrointestinal: No hematochezia or melena,  No gastroesophageal reflux, no trouble swallowing  Urinary: [ ]  chronic Kidney disease, [ ]  on HD - [ ]  MWF or [ ]  TTHS, [ ]  Burning with urination, [ ]  Frequent urination, [ ]  Difficulty urinating;   Skin: No rashes  Psychological: No history of anxiety,  No history of depression   Physical Examination  Vitals:   02/21/19 0955  BP: 120/76  Pulse: (!) 55  Resp: 20  Temp: 97.8 F (36.6 C)  SpO2: 95%  Weight: 119 lb 11.2 oz (54.3 kg)  Height: 5\' 9"  (1.753 m)    Body mass index is 17.68 kg/m.  General:  Alert and oriented, no acute distress HEENT: Normal Neck: No JVD \\Cardiac : Regular Rate and Rhythm  Abdomen: Soft, non-tender, non-distended, no mass Skin: No rash Extremity Pulses:  2+ radial, brachial, absent right 1+ left femoral, absent popliteal dorsalis pedis, posterior tibial pulses bilaterally Musculoskeletal: No deformity or edema  Neurologic: Upper and lower extremity motor 5/5 and symmetric  DATA:  Patient had bilateral ABIs performed at [facility] February 01, 2019.  Right side was 0.58 left side 0.72 I reviewed the study today.  ASSESSMENT: Bilateral lower extremity claudication.  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  I discussed with him that currently he is not at risk of limb loss.  However, he feels fairly debilitated by his symptoms  and would like to have an intervention at this point to improve his walking distance.  I discussed with him the possibility of arteriogram lower extremity runoff possible intervention.  I discussed with him the risk benefits possible complications and procedure details including not limited to bleeding infection vessel injury contrast reaction.  I also discussed with him the alternative conservative option of a walking program with risk factor modification including smoking cessation statin and aspirin.  He has opted for an intervention at this point.  PLAN: Patient was started on Crestor 20 mg once daily and aspirin 81 mg once daily today.  I discussed with him that recent studies have shown that this decreases mortality in patients with known peripheral arterial disease.  Patient will be scheduled in the next few weeks most likely for an intervention.  He wished to think about this for a few days prior to scheduling.  If the patient decides not to proceed with an intervention he will be scheduled for our APP clinic in 6 months time for repeat ABIs surveillance.  Ruta Hinds, MD [facility] [place] Office: [phone] Pager: [phone]

## 2019-02-21 NOTE — H&P (View-Only) (Signed)
Referring Physician: Dr. Gerarda Fraction  Patient name: Todd [name] MRN: [id number] DOB: [birth year] Sex: male  REASON FOR CONSULT: Bilateral leg pain  HPI: Todd [name] is a 68 y.o. male, with an 26-month history of bilateral leg pain.  Right leg is much more significant than the left leg.  He develops pain in his right thigh and right calf after walking about 20 yards.  He does have a history of atrial fibrillation.  However, he is not on anticoagulation due to a prior GI bleed about 10 years ago.  He does have some shortness of breath with walking but states that his legs start to report before his shortness of breath currently.  Greater than 3 minutes today spent regarding tobacco cessation counseling.  He currently smokes a little bit less than a half a pack of cigarettes per day.  He does have a history of alcohol abuse.  He has cut back significantly according to him.  He drinks currently less than 5 beers on the Sunday otherwise does not consume alcohol.  He does not describe rest pain in the feet.  He has no nonhealing wounds.  Other medical problems include atrial fibrillation, hypertension all of which are currently stable.  Past Medical History:  Diagnosis Date  . Alcoholism ([place])    History of withdrawal and seizures  . Atrial fibrillation ([place])    Taken off of Coumadin in 2009 in the setting of GI bleed  . Chronic back pain   . Colitis    4/11  . Essential hypertension, benign   . Gout   . History of GI bleed   . Internal hemorrhoids    Colonoscopy 11/09  . Osteoarthritis   . Schatzki's ring    EGD 11/09   Past Surgical History:  Procedure Laterality Date  . Excision of gouty lesion     Left index finger  . Right knee arthroscopy    . SPINE SURGERY      Family History  Problem Relation Age of Onset  . Coronary artery disease Father   . Coronary artery disease Sister        MI at age 75    SOCIAL HISTORY: Social History   Socioeconomic History  .  Marital status: Married    Spouse name: Not on file  . Number of children: Not on file  . Years of education: Not on file  . Highest education level: Not on file  Occupational History  . Occupation: Retired    Fish farm manager: [employer]  Social Needs  . Financial resource strain: Not on file  . Food insecurity    Worry: Not on file    Inability: Not on file  . Transportation needs    Medical: Not on file    Non-medical: Not on file  Tobacco Use  . Smoking status: Current Every Day Smoker    Packs/day: 0.50    Types: Cigarettes  . Smokeless tobacco: Never Used  Substance and Sexual Activity  . Alcohol use: No    Alcohol/week: 0.0 standard drinks    Comment: History of heavy alcohol use, most recent quit attempt has lasted 3 months  . Drug use: No  . Sexual activity: Never  Lifestyle  . Physical activity    Days per week: Not on file    Minutes per session: Not on file  . Stress: Not on file  Relationships  . Social connections    Talks on phone: Not on file  Gets together: Not on file    Attends religious service: Not on file    Active member of club or organization: Not on file    Attends meetings of clubs or organizations: Not on file    Relationship status: Not on file  . Intimate partner violence    Fear of current or ex partner: Not on file    Emotionally abused: Not on file    Physically abused: Not on file    Forced sexual activity: Not on file  Other Topics Concern  . Not on file  Social History Narrative  . Not on file    No Known Allergies  Current Outpatient Medications  Medication Sig Dispense Refill  . amLODipine (NORVASC) 5 MG tablet Take 5 mg by mouth daily.    . Ascorbic Acid (VITAMIN C) 100 MG tablet Take 100 mg by mouth daily.    . metoprolol-hydrochlorothiazide (LOPRESSOR HCT) 50-25 MG tablet Take 1 tablet by mouth daily.     . Omega-3 Fatty Acids (FISH OIL) 1000 MG CAPS Take by mouth.     No current facility-administered medications  for this visit.     ROS:   General:  No weight loss, Fever, chills  HEENT: No recent headaches, no nasal bleeding, no visual changes, no sore throat  Neurologic: No dizziness, blackouts, seizures. No recent symptoms of stroke or mini- stroke. No recent episodes of slurred speech, or temporary blindness.  Cardiac: No recent episodes of chest pain/pressure, no shortness of breath at rest.  + shortness of breath with exertion.  + history of atrial fibrillation or irregular heartbeat  Vascular: No history of rest pain in feet.  No history of claudication.  No history of non-healing ulcer, No history of DVT   Pulmonary: No home oxygen, no productive cough, no hemoptysis,  No asthma or wheezing  Musculoskeletal:  [ ]  Arthritis, [ ]  Low back pain,  [ ]  Joint pain  Hematologic:No history of hypercoagulable state.  No history of easy bleeding.  No history of anemia  Gastrointestinal: No hematochezia or melena,  No gastroesophageal reflux, no trouble swallowing  Urinary: [ ]  chronic Kidney disease, [ ]  on HD - [ ]  MWF or [ ]  TTHS, [ ]  Burning with urination, [ ]  Frequent urination, [ ]  Difficulty urinating;   Skin: No rashes  Psychological: No history of anxiety,  No history of depression   Physical Examination  Vitals:   02/21/19 0955  BP: 120/76  Pulse: (!) 55  Resp: 20  Temp: 97.8 F (36.6 C)  SpO2: 95%  Weight: 119 lb 11.2 oz (54.3 kg)  Height: 5\' 9"  (1.753 m)    Body mass index is 17.68 kg/m.  General:  Alert and oriented, no acute distress HEENT: Normal Neck: No JVD \\Cardiac : Regular Rate and Rhythm  Abdomen: Soft, non-tender, non-distended, no mass Skin: No rash Extremity Pulses:  2+ radial, brachial, absent right 1+ left femoral, absent popliteal dorsalis pedis, posterior tibial pulses bilaterally Musculoskeletal: No deformity or edema  Neurologic: Upper and lower extremity motor 5/5 and symmetric  DATA:  Patient had bilateral ABIs performed at [facility] February 01, 2019.  Right side was 0.58 left side 0.72 I reviewed the study today.  ASSESSMENT: Bilateral lower extremity claudication.  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  I discussed with him that currently he is not at risk of limb loss.  However, he feels fairly debilitated by his symptoms  and would like to have an intervention at this point to improve his walking distance.  I discussed with him the possibility of arteriogram lower extremity runoff possible intervention.  I discussed with him the risk benefits possible complications and procedure details including not limited to bleeding infection vessel injury contrast reaction.  I also discussed with him the alternative conservative option of a walking program with risk factor modification including smoking cessation statin and aspirin.  He has opted for an intervention at this point.  PLAN: Patient was started on Crestor 20 mg once daily and aspirin 81 mg once daily today.  I discussed with him that recent studies have shown that this decreases mortality in patients with known peripheral arterial disease.  Patient will be scheduled in the next few weeks most likely for an intervention.  He wished to think about this for a few days prior to scheduling.  If the patient decides not to proceed with an intervention he will be scheduled for our APP clinic in 6 months time for repeat ABIs surveillance.  Ruta Hinds, MD [facility] [place] Office: [phone] Pager: [phone]

## 2019-02-26 ENCOUNTER — Other Ambulatory Visit: Payer: Self-pay | Admitting: *Deleted

## 2019-02-26 ENCOUNTER — Telehealth: Payer: Self-pay | Admitting: *Deleted

## 2019-02-26 NOTE — Telephone Encounter (Signed)
Left message on voice mail for patient to call this nurse back to schedule procedure with Dr. Oneida Alar.

## 2019-02-26 NOTE — Telephone Encounter (Signed)
Phone call returned to patient to schedule angiogram with Dr. Oneida Alar. Instructed to be at [facility] admitting at 10 am on 03/08/2019. Must have a driver and caregiver for discharge to home. NPO past MN night prior. Take heart and blood pressure medication with sips of water. Hold all other meds until after procedure. Nasal swab at [facility] on 03/05/2019 at 9:55 am scheduled. Verbalized and read back all instructions.

## 2019-03-05 ENCOUNTER — Other Ambulatory Visit (HOSPITAL_COMMUNITY)
Admission: RE | Admit: 2019-03-05 | Discharge: 2019-03-05 | Disposition: A | Discharge status: Home / Self Care | Payer: PPO | Source: Ambulatory Visit | Attending: Vascular Surgery | Admitting: Vascular Surgery

## 2019-03-05 ENCOUNTER — Other Ambulatory Visit: Payer: Self-pay

## 2019-03-05 DIAGNOSIS — Z20828 Contact with and (suspected) exposure to other viral communicable diseases: Secondary | ICD-10-CM | POA: Insufficient documentation

## 2019-03-05 DIAGNOSIS — Z01812 Encounter for preprocedural laboratory examination: Secondary | ICD-10-CM | POA: Diagnosis not present

## 2019-03-06 LAB — SARS CORONAVIRUS 2 (TAT 6-24 HRS): SARS Coronavirus 2: NEGATIVE

## 2019-03-08 ENCOUNTER — Other Ambulatory Visit: Payer: Self-pay

## 2019-03-08 ENCOUNTER — Encounter (HOSPITAL_COMMUNITY)
Admission: RE | Disposition: A | Discharge status: Home / Self Care | Payer: Self-pay | Source: Home / Self Care | Attending: Vascular Surgery

## 2019-03-08 ENCOUNTER — Ambulatory Visit (HOSPITAL_COMMUNITY)
Admission: RE | Admit: 2019-03-08 | Discharge: 2019-03-08 | Disposition: A | Discharge status: Home / Self Care | Payer: PPO | Attending: Vascular Surgery | Admitting: Vascular Surgery

## 2019-03-08 DIAGNOSIS — Z8249 Family history of ischemic heart disease and other diseases of the circulatory system: Secondary | ICD-10-CM | POA: Diagnosis not present

## 2019-03-08 DIAGNOSIS — M109 Gout, unspecified: Secondary | ICD-10-CM | POA: Insufficient documentation

## 2019-03-08 DIAGNOSIS — I4891 Unspecified atrial fibrillation: Secondary | ICD-10-CM | POA: Insufficient documentation

## 2019-03-08 DIAGNOSIS — Z79899 Other long term (current) drug therapy: Secondary | ICD-10-CM | POA: Insufficient documentation

## 2019-03-08 DIAGNOSIS — M79662 Pain in left lower leg: Secondary | ICD-10-CM | POA: Insufficient documentation

## 2019-03-08 DIAGNOSIS — M1990 Unspecified osteoarthritis, unspecified site: Secondary | ICD-10-CM | POA: Diagnosis not present

## 2019-03-08 DIAGNOSIS — F17210 Nicotine dependence, cigarettes, uncomplicated: Secondary | ICD-10-CM | POA: Diagnosis not present

## 2019-03-08 DIAGNOSIS — M79661 Pain in right lower leg: Secondary | ICD-10-CM | POA: Insufficient documentation

## 2019-03-08 DIAGNOSIS — I739 Peripheral vascular disease, unspecified: Secondary | ICD-10-CM | POA: Diagnosis not present

## 2019-03-08 DIAGNOSIS — I10 Essential (primary) hypertension: Secondary | ICD-10-CM | POA: Diagnosis not present

## 2019-03-08 DIAGNOSIS — I70213 Atherosclerosis of native arteries of extremities with intermittent claudication, bilateral legs: Secondary | ICD-10-CM | POA: Diagnosis not present

## 2019-03-08 HISTORY — PX: PERIPHERAL VASCULAR INTERVENTION: CATH118257

## 2019-03-08 HISTORY — PX: LOWER EXTREMITY ANGIOGRAPHY: CATH118251

## 2019-03-08 HISTORY — PX: ABDOMINAL AORTOGRAM: CATH118222

## 2019-03-08 LAB — POCT I-STAT, CHEM 8
BUN: 16 mg/dL (ref 8–23)
Calcium, Ion: 1.12 mmol/L — ABNORMAL LOW (ref 1.15–1.40)
Chloride: 98 mmol/L (ref 98–111)
Creatinine, Ser: 1.1 mg/dL (ref 0.61–1.24)
Glucose, Bld: 95 mg/dL (ref 70–99)
HCT: 46 % (ref 39.0–52.0)
Hemoglobin: 15.6 g/dL (ref 13.0–17.0)
Potassium: 3.4 mmol/L — ABNORMAL LOW (ref 3.5–5.1)
Sodium: 137 mmol/L (ref 135–145)
TCO2: 25 mmol/L (ref 22–32)

## 2019-03-08 LAB — POCT ACTIVATED CLOTTING TIME
Activated Clotting Time: 285 seconds
Activated Clotting Time: 246 seconds
Activated Clotting Time: 208 seconds
Activated Clotting Time: 142 seconds

## 2019-03-08 SURGERY — ABDOMINAL AORTOGRAM
Anesthesia: LOCAL

## 2019-03-08 MED ORDER — ACETAMINOPHEN 325 MG PO TABS: 650.0000 mg | ORAL_TABLET | ORAL | Status: DC | PRN

## 2019-03-08 MED ORDER — IODIXANOL 320 MG/ML IV SOLN
INTRAVENOUS | Status: DC | PRN
  Administered 2019-03-08: 15:00:00 185 mL

## 2019-03-08 MED ORDER — OXYCODONE HCL 5 MG PO TABS: 5.0000 mg | ORAL_TABLET | ORAL | Status: DC | PRN

## 2019-03-08 MED ORDER — SODIUM CHLORIDE 0.9% FLUSH: 3.0000 mL | Freq: Two times a day (BID) | INTRAVENOUS | Status: DC

## 2019-03-08 MED ORDER — HEPARIN (PORCINE) IN NACL 1000-0.9 UT/500ML-% IV SOLN
INTRAVENOUS | Status: DC | PRN
  Administered 2019-03-08: 500 mL

## 2019-03-08 MED ORDER — HEPARIN SODIUM (PORCINE) 1000 UNIT/ML IJ SOLN
INTRAMUSCULAR | Status: DC | PRN
  Administered 2019-03-08: 6000 [IU] via INTRAVENOUS

## 2019-03-08 MED ORDER — HEPARIN SODIUM (PORCINE) 1000 UNIT/ML IJ SOLN
INTRAMUSCULAR | Status: AC
  Filled 2019-03-08: qty 1

## 2019-03-08 MED ORDER — PROTAMINE SULFATE 10 MG/ML IV SOLN
50.0000 mg | Freq: Once | INTRAVENOUS | Status: AC
  Administered 2019-03-08: 50 mg via INTRAVENOUS

## 2019-03-08 MED ORDER — SODIUM CHLORIDE 0.9 % IV SOLN: INTRAVENOUS | Status: AC

## 2019-03-08 MED ORDER — LIDOCAINE HCL (PF) 1 % IJ SOLN
INTRAMUSCULAR | Status: AC
  Filled 2019-03-08: qty 30

## 2019-03-08 MED ORDER — LABETALOL HCL 5 MG/ML IV SOLN: 10.0000 mg | INTRAVENOUS | Status: DC | PRN

## 2019-03-08 MED ORDER — CLOPIDOGREL BISULFATE 75 MG PO TABS: 75.0000 mg | ORAL_TABLET | Freq: Every day | ORAL | Status: DC

## 2019-03-08 MED ORDER — CLOPIDOGREL BISULFATE 75 MG PO TABS: 300.0000 mg | ORAL_TABLET | Freq: Once | ORAL | Status: DC

## 2019-03-08 MED ORDER — HYDRALAZINE HCL 20 MG/ML IJ SOLN: 5.0000 mg | INTRAMUSCULAR | Status: DC | PRN

## 2019-03-08 MED ORDER — LIDOCAINE HCL (PF) 1 % IJ SOLN
INTRAMUSCULAR | Status: DC | PRN
  Administered 2019-03-08: 13 mL

## 2019-03-08 MED ORDER — SODIUM CHLORIDE 0.9% FLUSH: 3.0000 mL | INTRAVENOUS | Status: DC | PRN

## 2019-03-08 MED ORDER — PROTAMINE SULFATE 10 MG/ML IV SOLN
INTRAVENOUS | Status: AC
  Filled 2019-03-08: qty 5

## 2019-03-08 MED ORDER — SODIUM CHLORIDE 0.9 % IV SOLN: 250.0000 mL | INTRAVENOUS | Status: DC | PRN

## 2019-03-08 MED ORDER — MORPHINE SULFATE (PF) 2 MG/ML IV SOLN: 2.0000 mg | INTRAVENOUS | Status: DC | PRN

## 2019-03-08 MED ORDER — SODIUM CHLORIDE 0.9 % IV SOLN: INTRAVENOUS | Status: DC

## 2019-03-08 MED ORDER — CLOPIDOGREL BISULFATE 300 MG PO TABS
ORAL_TABLET | ORAL | Status: DC | PRN
  Administered 2019-03-08: 300 mg via ORAL

## 2019-03-08 MED ORDER — HEPARIN (PORCINE) IN NACL 1000-0.9 UT/500ML-% IV SOLN
INTRAVENOUS | Status: AC
  Filled 2019-03-08: qty 1000

## 2019-03-08 MED ORDER — ONDANSETRON HCL 4 MG/2ML IJ SOLN: 4.0000 mg | Freq: Four times a day (QID) | INTRAMUSCULAR | Status: DC | PRN

## 2019-03-08 SURGICAL SUPPLY — 23 items
BALLN MUSTANG 6.0X40 75 (BALLOONS) ×3
BALLN MUSTANG 6X60X75 (BALLOONS) ×3
BALLOON MUSTANG 6.0X40 75 (BALLOONS) ×2 IMPLANT
BALLOON MUSTANG 6X60X75 (BALLOONS) ×2 IMPLANT
CATH ANGIO 5F PIGTAIL 65CM (CATHETERS) ×3 IMPLANT
CATH CROSS OVER TEMPO 5F (CATHETERS) ×3 IMPLANT
CATH SOFT-VU ST 4F 90CM (CATHETERS) ×3 IMPLANT
DEVICE CONTINUOUS FLUSH (MISCELLANEOUS) ×3 IMPLANT
DEVICE TORQUE .025-.038 (MISCELLANEOUS) ×3 IMPLANT
GUIDEWIRE ANGLED .035X150CM (WIRE) ×3 IMPLANT
KIT ENCORE 26 ADVANTAGE (KITS) ×3 IMPLANT
KIT PV (KITS) ×3 IMPLANT
SHEATH PINNACLE 5F 10CM (SHEATH) ×3 IMPLANT
SHEATH PINNACLE MP 7F 45CM (SHEATH) ×3 IMPLANT
SHEATH PROBE COVER 6X72 (BAG) ×6 IMPLANT
STENT ABSOLUTE PRO 7X40X135 (Permanent Stent) ×3 IMPLANT
STENT INNOVA 7X60X130 (Permanent Stent) ×3 IMPLANT
SYR MEDRAD MARK V 150ML (SYRINGE) ×3 IMPLANT
TRANSDUCER W/STOPCOCK (MISCELLANEOUS) ×3 IMPLANT
TRAY PV CATH (CUSTOM PROCEDURE TRAY) ×3 IMPLANT
WIRE BENTSON .035X145CM (WIRE) ×3 IMPLANT
WIRE ROSEN-J .035X180CM (WIRE) ×3 IMPLANT
WIRE ROSEN-J .035X260CM (WIRE) ×3 IMPLANT

## 2019-03-08 NOTE — Progress Notes (Signed)
Site area- left  Site Prior to Removal- 0   Pressure Applied For-  20 MInutes   Bedrest Beginning at - 1730   Manual- Yes   Patient Status During Pull- Stable    Post Pull Groin Site- 0   Post Pull Instructions Given- Yes   Post Pull Pulses Present- Yes    Dressing Applied- Tegaderm and Gauze Dressing    Comments:  7 french pulled by Sherlyn Lick

## 2019-03-08 NOTE — Progress Notes (Signed)
No bleeding or hematoma noted after ambulation 

## 2019-03-08 NOTE — Interval H&P Note (Signed)
History and Physical Interval Note:  03/08/2019 12:17 PM  Todd [name]  has presented today for surgery, with the diagnosis of pvd claudication.  The various methods of treatment have been discussed with the patient and family. After consideration of risks, benefits and other options for treatment, the patient has consented to  Procedure(s): ABDOMINAL AORTOGRAM W/LOWER EXTREMITY (N/A) as a surgical intervention.  The patient's history has been reviewed, patient examined, no change in status, stable for surgery.  I have reviewed the patient's chart and labs.  Questions were answered to the patient's satisfaction.     Ruta Hinds

## 2019-03-08 NOTE — Op Note (Signed)
Procedure: Abdominal aortogram with bilateral lower extremity runoff, bilateral external iliac stent (7 x 60 self-expanding right, 7 x 40 self-expanding left)  Preoperative diagnosis: Claudication  Postoperative diagnosis: Same  Anesthesia: Local  Operative findings: #1 bilateral 70 to 80% renal artery stenosis  2.  90% right external iliac artery stenosis stented to 0% residual stenosis  3.  75% left external iliac artery stenosis stented to 0% residual stenosis  #4 bilateral internal iliac artery occlusion  5.  Right superficial femoral artery occlusion with reconstitution of the above-knee popliteal artery and one-vessel posterior tibial runoff  6.  50% left common femoral artery stenosis  7.  90% origin stenosis left profunda  8.  50% left superficial femoral artery stenosis  9.  Occlusion of proximal peroneal and posterior tibial artery with reconstitution via collaterals posterior tibial dominant runoff vessel  10.  Occlusion left dorsalis pedis artery  Operative details: After obtaining form consent, patient taken the [place] lab.  The patient was placed in supine position on the angios table.  Both groins were prepped and draped in usual sterile fashion.  Local anesthesia was infiltrated over the left common femoral artery.  Ultrasound was used to identify the left common femoral artery and femoral bifurcation.  Image was obtained for the patient's record.  Patient had a relatively high takeoff of the profunda femoris artery which required sticking the common femoral artery slightly higher than usual although I was still at the upper edge of the femoral head.  Introducer needle was used to cannulate the artery in this location and an 035 versa core wire threaded up the abdominal aorta under fluoroscopic guidance.  Next a 5 French sheath placed over the guidewire and left common femoral artery.  This was thoroughly flushed with heparinized saline.  A 5 French pigtail catheter was then  advanced over the guidewire into the abdominal aorta and abdominal aortogram was obtained in AP projection.  Left and right renal arteries are patent but each has a about a 75 to 80% proximal stenosis.  The infrarenal abdominal aorta is patent.  The left and right common iliac arteries are widely patent.  The left and right internal iliac arteries are completely occluded.  There is a 90% stenosis of the distal right external iliac artery.  There is a 75% stenosis of the distal left external iliac artery.  Next the pigtail catheter was pulled understood by the aorta bifurcation and bilateral oblique views of the pelvis were obtained confirming the above findings.  This also showed a 50% stenosis of the left common femoral artery and a 90% stenosis of the origin of the left profunda.  Next bilateral lower extremity runoff views were obtained through the pigtail catheter.  In the right lower extremity, the right common femoral artery is patent.  The right profunda is patent.  The right superficial femoral artery occludes at its origin is a flush occlusion.  The above-knee popliteal artery then reconstitutes via profunda collaterals.  Popliteal artery is small but patent.  There is one-vessel posterior tibial artery runoff to the right foot.  In the left lower extremity, the left common femoral artery has a 50% stenosis.  There is an 90% origin stenosis of the left profunda.  There is a 50% mid left superficial femoral artery stenosis.  There is fairly severe tibial disease.  The popliteal artery is patent.  The proximal portion of about 3 cm of the posterior tibial and peroneal arteries is occluded.  These then reconstitute in  her 2 runoff vessels to the left foot.  The peroneal artery is diminutive.  The posterior tibial artery is the main vessel to the left foot.  The anterior tibial artery is proximal two thirds is patent.  The dorsalis pedis artery is occluded.  At this point I decided to intervene on the  external iliac artery stenosis bilaterally.  The patient was given 6000 units of intravenous heparin.  ACT was confirmed to be greater than 250.  A 5 French crossover catheter was used to selectively catheterize the right common iliac artery and an 035 angled Glidewire was then advanced across the lesion and down into the right profunda.  A 4 French straight catheter was then placed over this and the wire swapped out for an [employer] wire.  We then swapped out the 5 [place] sheath for a 7 [place] destination sheath which was advanced up and over the aortic bifurcation near the external iliac artery stenosis on the right.  Contrast angiogram was performed for roadmapping.  A 7 x 60 Inova self-expanding stent was then brought up the operative field advanced and centered on the lesion.  It was then deployed and then postdilated with a 6 x 60 balloon.  Completion angiogram showed wide patency of the right external iliac artery no evidence of dissection.  Attention was then turned to the left side.  The sheath and guidewire were pulled back up and over the aortic bifurcation and the guidewire advanced up into the abdominal aorta.  Again using roadmapping techniques with contrast angiogram the precise location of the left external iliac artery stenosis was defined and a 7 x 40 Abbott absolute Pro self-expanding stent was centered on the lesion and deployed.  This was then postdilated with a 6 x 40 balloon.  Completion angiogram showed no evidence of dissection and widely patent stent.  At this point the dilator was placed back through the 7 French sheath in the left groin and this was advanced up into the left ill external iliac artery so that there was enough purchase to leave the sheath in place until the heparin had dissipated.  The patient tolerated procedure well and there were no complications.  The patient was taken to the holding area in stable condition.  Operative management: Patient now has patent  aortoiliac system and hopefully this will improve his claudication symptoms somewhat.  If not in the right leg he would need a right femoral to above-knee popliteal bypass.  In the left leg he has mainly tibial disease so any intervention for claudication in the left leg would consist of a left common femoral endarterectomy and profundoplasty with a tibial bypass only reserved for a limb threatening situation.  The patient also does have bilateral renal artery stenosis.  However he currently has well-controlled blood pressure on 3 medications metoprolol hydrochlorothiazide and Norvasc.  He also has a normal serum creatinine.  If his blood pressure becomes more poorly controlled or he has elevation in his renal function consideration for bilateral renal artery stenting would be entertained.  Ruta Hinds, MD [facility] [place] Office: [phone] Pager: [phone]

## 2019-03-08 NOTE — Discharge Instructions (Signed)
Femoral Site Care °This sheet gives you information about how to care for yourself after your procedure. Your health care provider may also give you more specific instructions. If you have problems or questions, contact your health care provider. °What can I expect after the procedure? °After the procedure, it is common to have: °· Bruising that usually fades within 1-2 weeks. °· Tenderness at the site. °Follow these instructions at home: °Wound care °· Follow instructions from your health care provider about how to take care of your insertion site. Make sure you: °? Wash your hands with soap and water before you change your bandage (dressing). If soap and water are not available, use hand sanitizer. °? Change your dressing as told by your health care provider. °? Leave stitches (sutures), skin glue, or adhesive strips in place. These skin closures may need to stay in place for 2 weeks or longer. If adhesive strip edges start to loosen and curl up, you may trim the loose edges. Do not remove adhesive strips completely unless your health care provider tells you to do that. °· Do not take baths, swim, or use a hot tub until your health care provider approves. °· You may shower 24-48 hours after the procedure or as told by your health care provider. °? Gently wash the site with plain soap and water. °? Pat the area dry with a clean towel. °? Do not rub the site. This may cause bleeding. °· Do not apply powder or lotion to the site. Keep the site clean and dry. °· Check your femoral site every day for signs of infection. Check for: °? Redness, swelling, or pain. °? Fluid or blood. °? Warmth. °? Pus or a bad smell. °Activity °· For the first 2-3 days after your procedure, or as long as directed: °? Avoid climbing stairs as much as possible. °? Do not squat. °· Do not lift anything that is heavier than 10 lb (4.5 kg), or the limit that you are told, until your health care provider says that it is safe. °· Rest as  directed. °? Avoid sitting for a long time without moving. Get up to take short walks every 1-2 hours. °· Do not drive for 24 hours if you were given a medicine to help you relax (sedative). °General instructions °· Take over-the-counter and prescription medicines only as told by your health care provider. °· Keep all follow-up visits as told by your health care provider. This is important. °Contact a health care provider if you have: °· A fever or chills. °· You have redness, swelling, or pain around your insertion site. °Get help right away if: °· The catheter insertion area swells very fast. °· You pass out. °· You suddenly start to sweat or your skin gets clammy. °· The catheter insertion area is bleeding, and the bleeding does not stop when you hold steady pressure on the area. °· The area near or just beyond the catheter insertion site becomes pale, cool, tingly, or numb. °These symptoms may represent a serious problem that is an emergency. Do not wait to see if the symptoms will go away. Get medical help right away. Call your local emergency services (911 in the [place] Do not drive yourself to the hospital. °Summary °· After the procedure, it is common to have bruising that usually fades within 1-2 weeks. °· Check your femoral site every day for signs of infection. °· Do not lift anything that is heavier than 10 lb (4.5 kg), or the   limit that you are told, until your health care provider says that it is safe. °This information is not intended to replace advice given to you by your health care provider. Make sure you discuss any questions you have with your health care provider. °Document Released: 02/14/2014 Document Revised: 06/26/2017 Document Reviewed: 06/26/2017 °Elsevier Patient Education © 2020 [employer] ° °

## 2019-03-09 ENCOUNTER — Other Ambulatory Visit: Payer: Self-pay | Admitting: Vascular Surgery

## 2019-03-09 MED ORDER — CLOPIDOGREL BISULFATE 75 MG PO TABS: 75.0000 mg | ORAL_TABLET | Freq: Every day | ORAL | 6 refills | Status: DC

## 2019-03-11 ENCOUNTER — Encounter (HOSPITAL_COMMUNITY): Payer: Self-pay | Admitting: Vascular Surgery

## 2019-04-09 ENCOUNTER — Other Ambulatory Visit: Payer: Self-pay

## 2019-04-09 DIAGNOSIS — I739 Peripheral vascular disease, unspecified: Secondary | ICD-10-CM

## 2019-04-10 ENCOUNTER — Telehealth (HOSPITAL_COMMUNITY): Payer: Self-pay

## 2019-04-10 NOTE — Telephone Encounter (Signed)

## 2019-04-11 ENCOUNTER — Ambulatory Visit (HOSPITAL_COMMUNITY)
Admission: RE | Admit: 2019-04-11 | Discharge: 2019-04-11 | Disposition: A | Discharge status: Home / Self Care | Payer: PPO | Source: Ambulatory Visit | Attending: Vascular Surgery | Admitting: Vascular Surgery

## 2019-04-11 ENCOUNTER — Other Ambulatory Visit: Payer: Self-pay

## 2019-04-11 ENCOUNTER — Ambulatory Visit (INDEPENDENT_AMBULATORY_CARE_PROVIDER_SITE_OTHER): Payer: PPO | Admitting: Physician Assistant

## 2019-04-11 VITALS — BP 115/77 | HR 74 | Temp 97.8°F | Resp 14 | Ht 68.0 in | Wt 126.0 lb

## 2019-04-11 DIAGNOSIS — I739 Peripheral vascular disease, unspecified: Secondary | ICD-10-CM

## 2019-04-11 NOTE — Progress Notes (Signed)
HISTORY AND PHYSICAL     CC:  follow up. Requesting Provider:  Redmond School, MD  HPI: This is a 68 y.o. male who is here today for follow up.  He underwent Abdominal aortogram with bilateral lower extremity runoff, bilateral external iliac stent (7 x 60 self-expanding right, 7 x 40 self-expanding left) for cluadication on 03/08/2019 by Dr. Oneida Alar.  He presents today for follow up.  He had a 90% right EIA stenosisto a 0% residual stenosis and 75% left EIA stenosis to 0% residual stenosis.  He had a right SFA occlusion with reconstitution of the above knee popliteal artery and one vessel PT runoff.   He did have an 8 month hx of BLE leg pain. The right was worse than the left after walking about 20 yards.  He has hx of afib but not on AC due to hx of GIB about 10 years ago.  He does have some shortness of breath with walking but states that his legs start to report before his shortness of breath currently.  He did not have rest pain in his feet.    The pt returns today for for follow up.  He states that the cramping in his legs has resolved with normal activities.  He states that when he goes out to the park to try to exercise, he can only go a little less than a mile where as he used to go a couple of miles several times a week.  He states that his legs don't cramp, he just gives out.  He continues to smoke but is trying to cut back.    The pt is on a statin for cholesterol management.    The pt is on an aspirin.    Other AC:  Plavix The pt is on CCB and BB for hypertension.  The pt does not have diabetes. Tobacco hx:  current   Past Medical History:  Diagnosis Date  . Alcoholism ([place])    History of withdrawal and seizures  . Atrial fibrillation ([place])    Taken off of Coumadin in 2009 in the setting of GI bleed  . Chronic back pain   . Colitis    4/11  . Essential hypertension, benign   . Gout   . History of GI bleed   . Internal hemorrhoids    Colonoscopy 11/09  . Osteoarthritis    . Schatzki's ring    EGD 11/09    Past Surgical History:  Procedure Laterality Date  . ABDOMINAL AORTOGRAM N/A 03/08/2019   Procedure: ABDOMINAL AORTOGRAM;  Surgeon: Elam Dutch, MD;  Location: [facility];  Service: Cardiovascular;  Laterality: N/A;  . Excision of gouty lesion     Left index finger  . LOWER EXTREMITY ANGIOGRAPHY Bilateral 03/08/2019   Procedure: Lower Extremity Angiography;  Surgeon: Elam Dutch, MD;  Location: [facility];  Service: Cardiovascular;  Laterality: Bilateral;  . PERIPHERAL VASCULAR INTERVENTION Bilateral 03/08/2019   Procedure: PERIPHERAL VASCULAR INTERVENTION;  Surgeon: Elam Dutch, MD;  Location: [facility];  Service: Cardiovascular;  Laterality: Bilateral;  ext iliac artery stent  . Right knee arthroscopy    . SPINE SURGERY      No Known Allergies  Current Outpatient Medications  Medication Sig Dispense Refill  . amLODipine (NORVASC) 5 MG tablet Take 5 mg by mouth daily with breakfast.     . aspirin EC 81 MG tablet Take 1 tablet (81 mg total) by mouth daily. (Patient taking  differently: Take 81 mg by mouth daily after breakfast. ) 150 tablet 2  . Aspirin-Caffeine (BC FAST PAIN RELIEF PO) Take 1 packet by mouth 2 (two) times daily as needed (leg pain.).    Marland Kitchen clopidogrel (PLAVIX) 75 MG tablet Take 1 tablet (75 mg total) by mouth daily. 30 tablet 6  . metoprolol-hydrochlorothiazide (LOPRESSOR HCT) 50-25 MG tablet Take 1 tablet by mouth daily with breakfast.     . Omega-3 Fatty Acids (FISH OIL) 1000 MG CAPS Take 1,000 mg by mouth daily.     . rosuvastatin (CRESTOR) 10 MG tablet Take 1 tablet (10 mg total) by mouth daily. (Patient taking differently: Take 10 mg by mouth daily after breakfast. ) 30 tablet 12   No current facility-administered medications for this visit.     Family History  Problem Relation Age of Onset  . Coronary artery disease Father   . Coronary artery disease Sister        MI at age 82     Social History   Socioeconomic History  . Marital status: Married    Spouse name: Not on file  . Number of children: Not on file  . Years of education: Not on file  . Highest education level: Not on file  Occupational History  . Occupation: Retired    Fish farm manager: [employer]  Social Needs  . Financial resource strain: Not on file  . Food insecurity    Worry: Not on file    Inability: Not on file  . Transportation needs    Medical: Not on file    Non-medical: Not on file  Tobacco Use  . Smoking status: Current Every Day Smoker    Packs/day: 0.50    Types: Cigarettes  . Smokeless tobacco: Never Used  Substance and Sexual Activity  . Alcohol use: No    Alcohol/week: 0.0 standard drinks    Comment: History of heavy alcohol use, most recent quit attempt has lasted 3 months  . Drug use: No  . Sexual activity: Never  Lifestyle  . Physical activity    Days per week: Not on file    Minutes per session: Not on file  . Stress: Not on file  Relationships  . Social Herbalist on phone: Not on file    Gets together: Not on file    Attends religious service: Not on file    Active member of club or organization: Not on file    Attends meetings of clubs or organizations: Not on file    Relationship status: Not on file  . Intimate partner violence    Fear of current or ex partner: Not on file    Emotionally abused: Not on file    Physically abused: Not on file    Forced sexual activity: Not on file  Other Topics Concern  . Not on file  Social History Narrative  . Not on file     REVIEW OF SYSTEMS:    [X]  denotes positive finding, [ ]  denotes negative finding Cardiac  Comments:  Chest pain or chest pressure:    Shortness of breath upon exertion:    Short of breath when lying flat:    Irregular heart rhythm:        Vascular    Pain in calf, thigh, or hip brought on by ambulation: x See hpi   Pain in feet at night that wakes you up from your sleep:      Blood clot in your veins:  Leg swelling:         Pulmonary    Oxygen at home:    Productive cough:     Wheezing:         Neurologic    Sudden weakness in arms or legs:     Sudden numbness in arms or legs:     Sudden onset of difficulty speaking or slurred speech:    Temporary loss of vision in one eye:     Problems with dizziness:         Gastrointestinal    Blood in stool:     Vomited blood:         Genitourinary    Burning when urinating:     Blood in urine:        Psychiatric    Major depression:         Hematologic    Bleeding problems:    Problems with blood clotting too easily:        Skin    Rashes or ulcers:        Constitutional    Fever or chills:      PHYSICAL EXAMINATION:  Today's Vitals   04/11/19 1326  BP: 115/77  Pulse: 74  Resp: 14  Temp: 97.8 F (36.6 C)  TempSrc: Temporal  SpO2: 96%  Weight: 126 lb (57.2 kg)  Height: 5\' 8"  (1.727 m)   Body mass index is 19.16 kg/m.   General:  WDWN in NAD; vital signs documented above Gait: Not observed HENT: WNL, normocephalic Pulmonary: normal non-labored breathing , without Rales, rhonchi,  wheezing Cardiac: regular HR Abdomen: soft, NT, no masses Skin: without rashes Vascular Exam/Pulses:  Right Left  Femoral 2+ (normal) 2+ (normal)  Popliteal Unable to palpate  Unable to palpate   DP trace Unable to palpate   PT Unable to palpate  Unable to palpate    Extremities: without ischemic changes, without Gangrene , without cellulitis; without open wounds;  Musculoskeletal: no muscle wasting or atrophy  Neurologic: A&O X 3;  No focal weakness or paresthesias are detected Psychiatric:  The pt has Normal affect.   Non-Invasive Vascular Imaging:   ABI's/TBI's on 04/11/2019: Right:  0.75 (M) Left:  0.76 (M)  Previous ABI's 02/01/2019: Right:  0.58 Left:  0.72   ASSESSMENT/PLAN:: 68 y.o. male who is s/p Abdominal aortogram with bilateral lower extremity runoff, bilateral external  iliac stent (7 x 60 self-expanding right, 7 x 40 self-expanding left) for cluadication on 03/08/2019 by Dr. Oneida Alar here for follow up.  -pt claudication sx have resolved.  His ABI's have improved-more so on the right.  He does still get fatigued.  Discussed with him to continue his walking program and try to continue to increase his distance.  Also discussed with him about smoking cessation and importance of this.  He is trying to quit.   -he will f/u with Dr. Oneida Alar in 3 months with ABI's and aorto-iliac duplex.  He will call sooner should he develop claudication or non healing wounds.  -continue asa/statin/plavix   Leontine Locket, PA-C [facility] [phone]  Clinic MD:   Oneida Alar

## 2019-07-17 ENCOUNTER — Telehealth (HOSPITAL_COMMUNITY): Payer: Self-pay

## 2019-07-17 ENCOUNTER — Other Ambulatory Visit: Payer: Self-pay

## 2019-07-17 DIAGNOSIS — I739 Peripheral vascular disease, unspecified: Secondary | ICD-10-CM

## 2019-07-17 NOTE — Telephone Encounter (Signed)

## 2019-07-18 ENCOUNTER — Other Ambulatory Visit: Payer: Self-pay

## 2019-07-18 ENCOUNTER — Ambulatory Visit: Payer: PPO | Admitting: Vascular Surgery

## 2019-07-18 ENCOUNTER — Ambulatory Visit (INDEPENDENT_AMBULATORY_CARE_PROVIDER_SITE_OTHER)
Admission: RE | Admit: 2019-07-18 | Discharge: 2019-07-18 | Disposition: A | Discharge status: Home / Self Care | Payer: PPO | Source: Ambulatory Visit | Attending: Surgery | Admitting: Surgery

## 2019-07-18 ENCOUNTER — Encounter: Payer: Self-pay | Admitting: Vascular Surgery

## 2019-07-18 ENCOUNTER — Ambulatory Visit (HOSPITAL_COMMUNITY)
Admission: RE | Admit: 2019-07-18 | Discharge: 2019-07-18 | Disposition: A | Discharge status: Home / Self Care | Payer: PPO | Source: Ambulatory Visit | Attending: Surgery | Admitting: Surgery

## 2019-07-18 VITALS — BP 133/87 | HR 72 | Temp 97.4°F | Resp 20 | Ht 68.0 in | Wt 127.4 lb

## 2019-07-18 DIAGNOSIS — I739 Peripheral vascular disease, unspecified: Secondary | ICD-10-CM

## 2019-07-18 NOTE — Progress Notes (Signed)
Patient is a 69 year old male who returns for follow-up today.  He previously had external iliac artery stents placed bilaterally.  It was done September 2020.  He has a known chronic right superficial femoral artery occlusion.  Unfortunately he is still smoking a few cigarettes per day.  He is trying to quit.  He has 0 claudication symptoms at this point.  He is on aspirin and a statin.  Review of systems: He has no shortness of breath.  He has no chest pain.  He is able to do all of his activities of daily living without difficulty.   Current Outpatient Medications on File Prior to Visit  Medication Sig Dispense Refill  . amLODipine (NORVASC) 5 MG tablet Take 5 mg by mouth daily with breakfast.     . aspirin EC 81 MG tablet Take 1 tablet (81 mg total) by mouth daily. (Patient taking differently: Take 81 mg by mouth daily after breakfast. ) 150 tablet 2  . Aspirin-Caffeine (BC FAST PAIN RELIEF PO) Take 1 packet by mouth 2 (two) times daily as needed (leg pain.).    Marland Kitchen clopidogrel (PLAVIX) 75 MG tablet Take 1 tablet (75 mg total) by mouth daily. 30 tablet 6  . metoprolol-hydrochlorothiazide (LOPRESSOR HCT) 50-25 MG tablet Take 1 tablet by mouth daily with breakfast.     . Omega-3 Fatty Acids (FISH OIL) 1000 MG CAPS Take 1,000 mg by mouth daily.     . rosuvastatin (CRESTOR) 10 MG tablet Take 1 tablet (10 mg total) by mouth daily. (Patient taking differently: Take 10 mg by mouth daily after breakfast. ) 30 tablet 12  . sildenafil (REVATIO) 20 MG tablet SMARTSIG:3-5 Tablet(s) By Mouth Daily PRN     No current facility-administered medications on file prior to visit.    Physical exam:  Vitals:   07/18/19 1032  BP: 133/87  Pulse: 72  Resp: 20  Temp: (!) 97.4 F (36.3 C)  SpO2: 100%  Weight: 127 lb 6.4 oz (57.8 kg)  Height: 5\' 8"  (1.727 m)    Extremities: 2+ femoral pulses absent popliteal and pedal pulses bilaterally  Data: Patient had bilateral ABIs performed today which were 0.7 on the  right 0.8 on the left.  He also had a duplex ultrasound performed which showed both external iliac artery stents are patent.  Assessment: Peripheral arterial disease status post bilateral external iliac artery stenting.  Currently without claudication symptoms.  Plan: The patient will continue to try to quit smoking.  He will continue his aspirin and statin.  He will follow up with [place] with repeat noninvasive arterial studies in 1 year.  Ruta Hinds, MD [facility] Office: [phone]

## 2019-07-19 ENCOUNTER — Other Ambulatory Visit: Payer: Self-pay | Admitting: *Deleted

## 2019-07-19 DIAGNOSIS — I739 Peripheral vascular disease, unspecified: Secondary | ICD-10-CM

## 2019-08-13 DIAGNOSIS — Z0001 Encounter for general adult medical examination with abnormal findings: Secondary | ICD-10-CM | POA: Diagnosis not present

## 2019-08-13 DIAGNOSIS — Z6820 Body mass index (BMI) 20.0-20.9, adult: Secondary | ICD-10-CM | POA: Diagnosis not present

## 2019-08-13 DIAGNOSIS — E782 Mixed hyperlipidemia: Secondary | ICD-10-CM | POA: Diagnosis not present

## 2019-08-13 DIAGNOSIS — N529 Male erectile dysfunction, unspecified: Secondary | ICD-10-CM | POA: Diagnosis not present

## 2019-08-13 DIAGNOSIS — M109 Gout, unspecified: Secondary | ICD-10-CM | POA: Diagnosis not present

## 2019-08-13 DIAGNOSIS — I4891 Unspecified atrial fibrillation: Secondary | ICD-10-CM | POA: Diagnosis not present

## 2019-08-13 DIAGNOSIS — Z1389 Encounter for screening for other disorder: Secondary | ICD-10-CM | POA: Diagnosis not present

## 2019-08-13 DIAGNOSIS — I10 Essential (primary) hypertension: Secondary | ICD-10-CM | POA: Diagnosis not present

## 2019-08-22 DIAGNOSIS — H524 Presbyopia: Secondary | ICD-10-CM | POA: Diagnosis not present

## 2019-08-22 DIAGNOSIS — Z961 Presence of intraocular lens: Secondary | ICD-10-CM | POA: Diagnosis not present

## 2019-10-14 ENCOUNTER — Telehealth: Payer: Self-pay

## 2019-10-14 NOTE — Telephone Encounter (Signed)
  Patient Consent for Virtual Visit         Todd [name] has provided verbal consent on 10/14/2019 for a virtual visit (video or telephone).   CONSENT FOR VIRTUAL VISIT FOR:  Todd [name]  By participating in this virtual visit I agree to the following:  I hereby voluntarily request, consent and authorize [facility] and its employed or contracted physicians, physician assistants, nurse practitioners or other licensed health care professionals (the Practitioner), to provide me with telemedicine health care services (the "Services") as deemed necessary by the treating Practitioner. I acknowledge and consent to receive the Services by the Practitioner via telemedicine. I understand that the telemedicine visit will involve communicating with the Practitioner through live audiovisual communication technology and the disclosure of certain medical information by electronic transmission. I acknowledge that I have been given the opportunity to request an in-person assessment or other available alternative prior to the telemedicine visit and am voluntarily participating in the telemedicine visit.  I understand that I have the right to withhold or withdraw my consent to the use of telemedicine in the course of my care at any time, without affecting my right to future care or treatment, and that the Practitioner or I may terminate the telemedicine visit at any time. I understand that I have the right to inspect all information obtained and/or recorded in the course of the telemedicine visit and may receive copies of available information for a reasonable fee.  I understand that some of the potential risks of receiving the Services via telemedicine include:  Marland Kitchen Delay or interruption in medical evaluation due to technological equipment failure or disruption; . Information transmitted may not be sufficient (e.g. poor resolution of images) to allow for appropriate medical decision making by the  Practitioner; and/or  . In rare instances, security protocols could fail, causing a breach of personal health information.  Furthermore, I acknowledge that it is my responsibility to provide information about my medical history, conditions and care that is complete and accurate to the best of my ability. I acknowledge that Practitioner's advice, recommendations, and/or decision may be based on factors not within their control, such as incomplete or inaccurate data provided by me or distortions of diagnostic images or specimens that may result from electronic transmissions. I understand that the practice of medicine is not an exact science and that Practitioner makes no warranties or guarantees regarding treatment outcomes. I acknowledge that a copy of this consent can be made available to me via my patient portal ([facility]), or I can request a printed copy by calling the office of [facility].    I understand that my insurance will be billed for this visit.   I have read or had this consent read to me. . I understand the contents of this consent, which adequately explains the benefits and risks of the Services being provided via telemedicine.  . I have been provided ample opportunity to ask questions regarding this consent and the Services and have had my questions answered to my satisfaction. . I give my informed consent for the services to be provided through the use of telemedicine in my medical care

## 2019-10-16 ENCOUNTER — Encounter: Payer: Self-pay | Admitting: Cardiology

## 2019-10-16 NOTE — Progress Notes (Signed)
Virtual Visit via Telephone Note   This visit type was conducted due to national recommendations for restrictions regarding the COVID-19 Pandemic (e.g. social distancing) in an effort to limit this patient's exposure and mitigate transmission in our community.  Due to his co-morbid illnesses, this patient is at least at moderate risk for complications without adequate follow up.  This format is felt to be most appropriate for this patient at this time.  The patient did not have access to video technology/had technical difficulties with video requiring transitioning to audio format only (telephone).  All issues noted in this document were discussed and addressed.  No physical exam could be performed with this format.  Please refer to the patient's chart for his  consent to telehealth for [facility].   The patient was identified using 2 identifiers.  Date:  10/17/2019   ID:  Todd [name], DOB [birth year], MRN [id number]  Patient Location: Home Provider Location: Home  PCP:  Redmond School, MD  Cardiologist:  Rozann Lesches, MD Electrophysiologist:  None   Evaluation Performed:  Follow-Up Visit  Chief Complaint:  Cardiac follow-up  History of Present Illness:    Todd [name] is a 69 y.o. male last seen in November 2019 by Ms. Strader PA-C.  We spoke by phone today.  He tells me that he has been doing well overall, no palpitations or chest pain.  Also reports improvement of previous claudication symptoms.  He is not anticoagulated with prior history of GI bleeding.  He does not report any recurrent bleeding, now on aspirin and Plavix related to bilateral external iliac artery stenting by Dr. Oneida Alar. Follow-up noted with [facility] in January, I reviewed the note.  We went over his medications today.  He states that he is running out of Plavix, I told him that we would send in a refill and I would communicate with Dr. Oneida Alar regarding anticipated duration of dual antiplatelet  therapy.  We are also requesting his most recent lab work from [place].  Past Medical History:  Diagnosis Date  . Alcoholism ([place])    History of withdrawal and seizures  . Atrial fibrillation ([place])    Taken off of Coumadin in 2009 in the setting of GI bleed  . Chronic back pain   . Colitis    4/11  . Essential hypertension   . Gout   . History of GI bleed   . Internal hemorrhoids    Colonoscopy 11/09  . Osteoarthritis   . Schatzki's ring    EGD 11/09   Past Surgical History:  Procedure Laterality Date  . ABDOMINAL AORTOGRAM N/A 03/08/2019   Procedure: ABDOMINAL AORTOGRAM;  Surgeon: Elam Dutch, MD;  Location: [place] [facility];  Service: Cardiovascular;  Laterality: N/A;  . Excision of gouty lesion     Left index finger  . LOWER EXTREMITY ANGIOGRAPHY Bilateral 03/08/2019   Procedure: Lower Extremity Angiography;  Surgeon: Elam Dutch, MD;  Location: [place] [facility];  Service: Cardiovascular;  Laterality: Bilateral;  . PERIPHERAL VASCULAR INTERVENTION Bilateral 03/08/2019   Procedure: PERIPHERAL VASCULAR INTERVENTION;  Surgeon: Elam Dutch, MD;  Location: [place] [facility];  Service: Cardiovascular;  Laterality: Bilateral;  ext iliac artery stent  . Right knee arthroscopy    . SPINE SURGERY       Current Meds  Medication Sig  . amLODipine (NORVASC) 5 MG tablet Take 5 mg by mouth daily with breakfast.   . aspirin EC 81 MG tablet Take 1  tablet (81 mg total) by mouth daily. (Patient taking differently: Take 81 mg by mouth daily after breakfast. )  . Aspirin-Caffeine (BC FAST PAIN RELIEF PO) Take 1 packet by mouth 2 (two) times daily as needed (leg pain.).  Marland Kitchen clopidogrel (PLAVIX) 75 MG tablet Take 1 tablet (75 mg total) by mouth daily.  . metoprolol-hydrochlorothiazide (LOPRESSOR HCT) 50-25 MG tablet Take 1 tablet by mouth daily with breakfast.   . Omega-3 Fatty Acids (FISH OIL) 1000 MG CAPS Take 1,000 mg by mouth daily.   . rosuvastatin (CRESTOR) 10 MG tablet  Take 1 tablet (10 mg total) by mouth daily. (Patient taking differently: Take 10 mg by mouth daily after breakfast. )  . sildenafil (REVATIO) 20 MG tablet SMARTSIG:3-5 Tablet(s) By Mouth Daily PRN  . [DISCONTINUED] clopidogrel (PLAVIX) 75 MG tablet Take 1 tablet (75 mg total) by mouth daily.     Allergies:   Patient has no known allergies.   ROS:   No palpitations or syncope.  Prior CV studies:   The following studies were reviewed today:  No interval cardiac testing for review today.  Labs/Other Tests and Data Reviewed:    EKG:  An ECG dated 05/09/2017 was personally reviewed today and demonstrated:  Rate controlled atrial fibrillation with poor R wave progression, rightward axis.  Recent Labs: 03/08/2019: BUN 16; Creatinine, Ser 1.10; Hemoglobin 15.6; Potassium 3.4; Sodium 137    Wt Readings from Last 3 Encounters:  10/17/19 133 lb (60.3 kg)  07/18/19 127 lb 6.4 oz (57.8 kg)  04/11/19 126 lb (57.2 kg)     Objective:    Vital Signs:  BP 122/78   Ht 5\' 8"  (1.727 m)   Wt 133 lb (60.3 kg)   BMI 20.22 kg/m    Patient spoke in full sentences, not short of breath. No audible wheezing or coughing.  ASSESSMENT & PLAN:    1.  Persistent atrial fibrillation by history.  CHA2DS2-VASc score is 3. He does not report any active palpitations and continues on Lopressor.  He is not anticoagulated with prior history of GI bleeding on Coumadin.  2.  PAD status post bilateral external iliac artery stenting by Dr. Oneida Alar.  No active claudication at this time.  He continues on aspirin, Plavix, and statin therapy.  3.  Mixed hyperlipidemia currently on Crestor.  Requesting lab work from [place].   Time:   Today, I have spent 7 minutes with the patient with telehealth technology discussing the above problems.     Medication Adjustments/Labs and Tests Ordered: Current medicines are reviewed at length with the patient today.  Concerns regarding medicines are outlined above.   Tests  Ordered: No orders of the defined types were placed in this encounter.   Medication Changes: Meds ordered this encounter  Medications  . clopidogrel (PLAVIX) 75 MG tablet    Sig: Take 1 tablet (75 mg total) by mouth daily.    Dispense:  30 tablet    Refill:  6    Follow Up:  In Person 6 months in the [place] office.  Signed, Rozann Lesches, MD  10/17/2019 11:09 AM    [place]

## 2019-10-17 ENCOUNTER — Telehealth (INDEPENDENT_AMBULATORY_CARE_PROVIDER_SITE_OTHER): Payer: PPO | Admitting: Cardiology

## 2019-10-17 ENCOUNTER — Encounter: Payer: Self-pay | Admitting: Cardiology

## 2019-10-17 ENCOUNTER — Other Ambulatory Visit: Payer: Self-pay

## 2019-10-17 VITALS — BP 122/78 | Ht 68.0 in | Wt 133.0 lb

## 2019-10-17 DIAGNOSIS — Z7982 Long term (current) use of aspirin: Secondary | ICD-10-CM

## 2019-10-17 DIAGNOSIS — E782 Mixed hyperlipidemia: Secondary | ICD-10-CM | POA: Diagnosis not present

## 2019-10-17 DIAGNOSIS — I739 Peripheral vascular disease, unspecified: Secondary | ICD-10-CM

## 2019-10-17 DIAGNOSIS — Z7902 Long term (current) use of antithrombotics/antiplatelets: Secondary | ICD-10-CM | POA: Diagnosis not present

## 2019-10-17 DIAGNOSIS — I10 Essential (primary) hypertension: Secondary | ICD-10-CM

## 2019-10-17 DIAGNOSIS — I4819 Other persistent atrial fibrillation: Secondary | ICD-10-CM

## 2019-10-17 MED ORDER — CLOPIDOGREL BISULFATE 75 MG PO TABS: 75.0000 mg | ORAL_TABLET | Freq: Every day | ORAL | 6 refills | Status: DC

## 2019-10-17 NOTE — Patient Instructions (Signed)
Medication Instructions:  Your physician recommends that you continue on your current medications as directed. Please refer to the Current Medication list given to you today.  I refilled your Plavix  *If you need a refill on your cardiac medications before your next appointment, please call your pharmacy*   Lab Work: None today, I will request labs from your primary care M.D.  If you have labs (blood work) drawn today and your tests are completely normal, you will receive your results only by: Marland Kitchen MyChart Message (if you have MyChart) OR . A paper copy in the mail If you have any lab test that is abnormal or we need to change your treatment, we will call you to review the results.   Testing/Procedures: None today   Follow-Up: At [facility], you and your health needs are our priority.  As part of our continuing mission to provide you with exceptional heart care, we have created designated Provider Care Teams.  These Care Teams include your primary Cardiologist (physician) and Advanced Practice Providers (APPs -  Physician Assistants and Nurse Practitioners) who all work together to provide you with the care you need, when you need it.  We recommend signing up for the patient portal called "MyChart".  Sign up information is provided on this After Visit Summary.  MyChart is used to connect with patients for Virtual Visits (Telemedicine).  Patients are able to view lab/test results, encounter notes, upcoming appointments, etc.  Non-urgent messages can be sent to your provider as well.   To learn more about what you can do with MyChart, go to [email or web address].    Your next appointment:   6 month(s)  The format for your next appointment:   In Person  Provider:   Rozann Lesches, MD   Other Instructions None     Thank you for choosing [facility] !

## 2020-02-28 ENCOUNTER — Other Ambulatory Visit: Payer: Self-pay | Admitting: Vascular Surgery

## 2020-04-01 ENCOUNTER — Other Ambulatory Visit: Payer: Self-pay | Admitting: Vascular Surgery

## 2020-04-17 ENCOUNTER — Encounter: Payer: Self-pay | Admitting: Cardiology

## 2020-04-17 ENCOUNTER — Other Ambulatory Visit: Payer: Self-pay

## 2020-04-17 ENCOUNTER — Ambulatory Visit: Payer: PPO | Admitting: Cardiology

## 2020-04-17 VITALS — BP 106/56 | HR 74 | Resp 16 | Ht 68.0 in | Wt 123.2 lb

## 2020-04-17 DIAGNOSIS — E782 Mixed hyperlipidemia: Secondary | ICD-10-CM

## 2020-04-17 DIAGNOSIS — I739 Peripheral vascular disease, unspecified: Secondary | ICD-10-CM

## 2020-04-17 DIAGNOSIS — I4819 Other persistent atrial fibrillation: Secondary | ICD-10-CM | POA: Diagnosis not present

## 2020-04-17 NOTE — Progress Notes (Signed)
Cardiology Office Note  Date: 04/17/2020   ID: Todd, [name] 1950/09/06, MRN [id number]  PCP:  Redmond School, MD  Cardiologist:  Rozann Lesches, MD Electrophysiologist:  None   Chief Complaint  Patient presents with  . Cardiac follow-up    History of Present Illness: Todd [name] is a 69 y.o. male last assessed via telehealth encounter in April.  He presents for a routine visit.  He does not report any sense of palpitations, no exertional chest pain.  He is following with Dr. Oneida Alar, history of of external iliac artery stent intervention bilaterally.  He reports much improved claudication overall.  Lab work from February is noted below.  LDL was 31.  He continues on statin therapy, also on Plavix.  He has not been able to quit smoking.  Personally reviewed his ECG today which shows rate controlled atrial fibrillation with poor R wave progression anteriorly, old.  Past Medical History:  Diagnosis Date  . Alcoholism ([place])    History of withdrawal and seizures  . Atrial fibrillation ([place])    Taken off of Coumadin in 2009 in the setting of GI bleed  . Chronic back pain   . Colitis    4/11  . Essential hypertension   . Gout   . History of GI bleed   . Internal hemorrhoids    Colonoscopy 11/09  . Osteoarthritis   . Schatzki's ring    EGD 11/09    Past Surgical History:  Procedure Laterality Date  . ABDOMINAL AORTOGRAM N/A 03/08/2019   Procedure: ABDOMINAL AORTOGRAM;  Surgeon: Elam Dutch, MD;  Location: [place] [facility];  Service: Cardiovascular;  Laterality: N/A;  . Excision of gouty lesion     Left index finger  . LOWER EXTREMITY ANGIOGRAPHY Bilateral 03/08/2019   Procedure: Lower Extremity Angiography;  Surgeon: Elam Dutch, MD;  Location: [place] [facility];  Service: Cardiovascular;  Laterality: Bilateral;  . PERIPHERAL VASCULAR INTERVENTION Bilateral 03/08/2019   Procedure: PERIPHERAL VASCULAR INTERVENTION;  Surgeon: Elam Dutch, MD;  Location: [place] [facility];  Service: Cardiovascular;  Laterality: Bilateral;  ext iliac artery stent  . Right knee arthroscopy    . SPINE SURGERY      Current Outpatient Medications  Medication Sig Dispense Refill  . amLODipine (NORVASC) 5 MG tablet Take 5 mg by mouth daily with breakfast.     . Aspirin-Caffeine (BC FAST PAIN RELIEF PO) Take 1 packet by mouth 2 (two) times daily as needed (leg pain.).    Marland Kitchen clopidogrel (PLAVIX) 75 MG tablet Take 1 tablet (75 mg total) by mouth daily. 30 tablet 6  . metoprolol-hydrochlorothiazide (LOPRESSOR HCT) 50-25 MG tablet Take 1 tablet by mouth daily with breakfast.     . Omega-3 Fatty Acids (FISH OIL) 1000 MG CAPS Take 1,000 mg by mouth daily.     . rosuvastatin (CRESTOR) 10 MG tablet TAKE (1) TABLET BY MOUTH ONCE DAILY. 30 tablet 3   No current facility-administered medications for this visit.   Allergies:  Patient has no known allergies.   ROS:  No syncope.  Physical Exam: VS:  BP (!) 106/56   Pulse 74   Resp 16   Ht 5\' 8"  (1.727 m)   Wt 123 lb 3.2 oz (55.9 kg)   SpO2 99%   BMI 18.73 kg/m , BMI Body mass index is 18.73 kg/m.  Wt Readings from Last 3 Encounters:  04/17/20 123 lb 3.2 oz (55.9 kg)  10/17/19 133 lb (  60.3 kg)  07/18/19 127 lb 6.4 oz (57.8 kg)    General: Patient appears comfortable at rest. HEENT: Conjunctiva and lids normal, wearing a mask. Neck: Supple, no elevated JVP or carotid bruits, no thyromegaly. Lungs: Clear to auscultation, nonlabored breathing at rest. Cardiac: Irregularly irregular, no S3 or significant systolic murmur. Extremities: No pitting edema, distal pulses 2+.  ECG:  An ECG dated 05/15/2018 was personally reviewed today and demonstrated:  Atrial fibrillation with anteroseptal Q waves and rightward axis.  Recent Labwork:  February 2021: Hemoglobin 13.6, platelets 202, BUN 12, creatinine 1.3, potassium 4.0, AST 17, ALT 5, LDL 31, HDL 57, triglycerides 55, TSH 3.08  Other Studies  Reviewed Today:  No interval cardiac testing for review today.  Assessment and Plan:  1.  Permanent atrial fibrillation, continue strategy of heart rate control.  He is on Lopressor.  CHA2DS2-VASc score is 3, as mentioned previously he is not anticoagulated with history of GI bleeding on Coumadin in the past.  ECG reviewed.  2.  PAD status post bilateral external iliac artery stenting.  Keep follow-up with Dr. Oneida Alar.  He reports significant improvement in claudication, remains on Plavix and statin therapy.  3.  Mixed hyperlipidemia, on Crestor.  Last LDL 57.  Medication Adjustments/Labs and Tests Ordered: Current medicines are reviewed at length with the patient today.  Concerns regarding medicines are outlined above.   Tests Ordered: Orders Placed This Encounter  Procedures  . EKG 12-Lead    Medication Changes: No orders of the defined types were placed in this encounter.   Disposition:  Follow up 1 year in the [place] office.  Signed, Satira Sark, MD, [facility] 04/17/2020 4:00 PM    [place] [facility] [address], [place], [place] [address] Phone: [phone]; Fax: [phone]

## 2020-04-17 NOTE — Patient Instructions (Signed)
Medication Instructions:  Your physician recommends that you continue on your current medications as directed. Please refer to the Current Medication list given to you today.  *If you need a refill on your cardiac medications before your next appointment, please call your pharmacy*   Lab Work: None today If you have labs (blood work) drawn today and your tests are completely normal, you will receive your results only by: . MyChart Message (if you have MyChart) OR . A paper copy in the mail If you have any lab test that is abnormal or we need to change your treatment, we will call you to review the results.   Testing/Procedures: None today   Follow-Up: At [facility], you and your health needs are our priority.  As part of our continuing mission to provide you with exceptional heart care, we have created designated Provider Care Teams.  These Care Teams include your primary Cardiologist (physician) and Advanced Practice Providers (APPs -  Physician Assistants and Nurse Practitioners) who all work together to provide you with the care you need, when you need it.  We recommend signing up for the patient portal called "MyChart".  Sign up information is provided on this After Visit Summary.  MyChart is used to connect with patients for Virtual Visits (Telemedicine).  Patients are able to view lab/test results, encounter notes, upcoming appointments, etc.  Non-urgent messages can be sent to your provider as well.   To learn more about what you can do with MyChart, go to [email or web address].    Your next appointment:   12 month(s)  The format for your next appointment:   In Person  Provider:   Samuel McDowell, MD   Other Instructions None       Thank you for choosing [facility] !         

## 2020-04-25 ENCOUNTER — Other Ambulatory Visit: Payer: Self-pay | Admitting: Cardiology

## 2020-05-29 DIAGNOSIS — Z6820 Body mass index (BMI) 20.0-20.9, adult: Secondary | ICD-10-CM | POA: Diagnosis not present

## 2020-05-29 DIAGNOSIS — I4891 Unspecified atrial fibrillation: Secondary | ICD-10-CM | POA: Diagnosis not present

## 2020-05-29 DIAGNOSIS — L723 Sebaceous cyst: Secondary | ICD-10-CM | POA: Diagnosis not present

## 2020-05-29 DIAGNOSIS — Z719 Counseling, unspecified: Secondary | ICD-10-CM | POA: Diagnosis not present

## 2020-05-29 DIAGNOSIS — I10 Essential (primary) hypertension: Secondary | ICD-10-CM | POA: Diagnosis not present

## 2020-06-18 DIAGNOSIS — R220 Localized swelling, mass and lump, head: Secondary | ICD-10-CM | POA: Diagnosis not present

## 2020-06-22 ENCOUNTER — Other Ambulatory Visit: Payer: Self-pay | Admitting: Otolaryngology

## 2020-06-30 ENCOUNTER — Encounter (HOSPITAL_BASED_OUTPATIENT_CLINIC_OR_DEPARTMENT_OTHER): Payer: Self-pay | Admitting: Otolaryngology

## 2020-06-30 ENCOUNTER — Other Ambulatory Visit: Payer: Self-pay

## 2020-06-30 ENCOUNTER — Telehealth: Payer: Self-pay | Admitting: *Deleted

## 2020-06-30 NOTE — Progress Notes (Signed)
Chart reviewed with Dr. Richardson Landry. Will need cardiac clearance and plavix instructions, ok to proceed with surgery as scheduled once we have those things. No echo needed prior to surgery. Heather at Dr. Avel Sensor office notified.

## 2020-06-30 NOTE — Telephone Encounter (Signed)
   We have been contacted for preoperative clearance for this patient prior to facial excision. Surgical team is asking for Plavix holding recommendations (asking to hold 5 days prior). It appears that he is on Plavix for a hx of bilateral external iliac artery stents placement performed 02/2019. Also has a known hx of known chronic right superficial femoral artery occlusion.   Dr. Darrick Penna- Could you please reply with Plavix holding recommendations prior to his procedure?

## 2020-06-30 NOTE — Telephone Encounter (Signed)
   [facility] Pre-operative Risk Assessment    HEARTCARE STAFF: - Please ensure there is not already an duplicate clearance open for this procedure. - Under Visit Info/Reason for Call, type in Other and utilize the format Clearance MM/DD/YY or Clearance TBD. Do not use dashes or single digits. - If request is for dental extraction, please clarify the # of teeth to be extracted.  Request for surgical clearance:  1. What type of surgery is being performed? EXCISION OF FACIAL MASS   2. When is this surgery scheduled? 07/07/20   3. What type of clearance is required (medical clearance vs. Pharmacy clearance to hold med vs. Both)? MEDICAL  4. Are there any medications that need to be held prior to surgery and how long? PLAVIX x 5 DAYS   5. Practice name and name of physician performing surgery? DR. Tamsen Roers [facility]   6. What is the office phone number? [phone]   7.   What is the office fax number? [phone]  8.   Anesthesia type (None, local, MAC, general) ? GENERAL   Todd [name] 06/30/2020, 1:54 PM  _________________________________________________________________   (provider comments below)

## 2020-07-01 ENCOUNTER — Other Ambulatory Visit: Payer: Self-pay | Admitting: Vascular Surgery

## 2020-07-02 NOTE — Telephone Encounter (Signed)
Pt called stating he needs to know today if he needs to hold his Plavix. He has not heard anything.

## 2020-07-02 NOTE — Telephone Encounter (Signed)
I called the office performing surgery, they are aware that Dr. Darrick Penna manages patients Plavix. Surgeons office will call Dr. Darrick Penna for Plavix recommendations.

## 2020-07-02 NOTE — Telephone Encounter (Signed)
We are waiting on Dr. Darrick Penna recommendations. Please call the requesting team and let them know that we do not manage the Plavix for this patient. He has had bilateral iliac stenting per Dr. Darrick Penna therefore holding recs need to come from their team.   Georgie Chard NP-C [facility] Pager: [phone]

## 2020-07-03 ENCOUNTER — Other Ambulatory Visit (HOSPITAL_COMMUNITY)
Admission: RE | Admit: 2020-07-03 | Discharge: 2020-07-03 | Disposition: A | Discharge status: Home / Self Care | Payer: PPO | Source: Ambulatory Visit | Attending: Otolaryngology | Admitting: Otolaryngology

## 2020-07-03 ENCOUNTER — Telehealth: Payer: Self-pay

## 2020-07-03 ENCOUNTER — Encounter (HOSPITAL_BASED_OUTPATIENT_CLINIC_OR_DEPARTMENT_OTHER)
Admission: RE | Admit: 2020-07-03 | Discharge: 2020-07-03 | Disposition: A | Discharge status: Home / Self Care | Payer: PPO | Source: Ambulatory Visit | Attending: Otolaryngology | Admitting: Otolaryngology

## 2020-07-03 DIAGNOSIS — Z20822 Contact with and (suspected) exposure to covid-19: Secondary | ICD-10-CM | POA: Diagnosis not present

## 2020-07-03 DIAGNOSIS — Z01812 Encounter for preprocedural laboratory examination: Secondary | ICD-10-CM | POA: Insufficient documentation

## 2020-07-03 LAB — BASIC METABOLIC PANEL
Anion gap: 13 (ref 5–15)
BUN: 17 mg/dL (ref 8–23)
CO2: 23 mmol/L (ref 22–32)
Calcium: 9 mg/dL (ref 8.9–10.3)
Chloride: 103 mmol/L (ref 98–111)
Creatinine, Ser: 1.17 mg/dL (ref 0.61–1.24)
GFR, Estimated: >60 mL/min (ref >60)
Glucose, Bld: 114 mg/dL — ABNORMAL HIGH (ref 70–99)
Potassium: 3.9 mmol/L (ref 3.5–5.1)
Sodium: 139 mmol/L (ref 135–145)

## 2020-07-03 LAB — SARS CORONAVIRUS 2 (TAT 6-24 HRS): SARS Coronavirus 2: NEGATIVE

## 2020-07-03 NOTE — Telephone Encounter (Signed)
Spoke to [employer] and Timea at Dr. Deeann Saint office after pt called with questions regarding Plavix hold before his upcoming surgery with Dr. Benjamine Mola. Per APP, pt can hold Plavix x 5 days. Pt is aware and I have let Timea know. Pt verbalized understanding; no further questions/concerns at this time.

## 2020-07-07 ENCOUNTER — Encounter (HOSPITAL_BASED_OUTPATIENT_CLINIC_OR_DEPARTMENT_OTHER): Payer: Self-pay | Admitting: Otolaryngology

## 2020-07-07 ENCOUNTER — Ambulatory Visit (HOSPITAL_BASED_OUTPATIENT_CLINIC_OR_DEPARTMENT_OTHER): Payer: PPO | Admitting: Anesthesiology

## 2020-07-07 ENCOUNTER — Ambulatory Visit (HOSPITAL_BASED_OUTPATIENT_CLINIC_OR_DEPARTMENT_OTHER)
Admission: RE | Admit: 2020-07-07 | Discharge: 2020-07-07 | Disposition: A | Discharge status: Home / Self Care | Payer: PPO | Attending: Otolaryngology | Admitting: Otolaryngology

## 2020-07-07 ENCOUNTER — Other Ambulatory Visit: Payer: Self-pay

## 2020-07-07 ENCOUNTER — Encounter (HOSPITAL_BASED_OUTPATIENT_CLINIC_OR_DEPARTMENT_OTHER)
Admission: RE | Disposition: A | Discharge status: Home / Self Care | Payer: Self-pay | Source: Home / Self Care | Attending: Otolaryngology

## 2020-07-07 DIAGNOSIS — F17210 Nicotine dependence, cigarettes, uncomplicated: Secondary | ICD-10-CM | POA: Diagnosis not present

## 2020-07-07 DIAGNOSIS — I10 Essential (primary) hypertension: Secondary | ICD-10-CM | POA: Diagnosis not present

## 2020-07-07 DIAGNOSIS — Z8249 Family history of ischemic heart disease and other diseases of the circulatory system: Secondary | ICD-10-CM | POA: Insufficient documentation

## 2020-07-07 DIAGNOSIS — L720 Epidermal cyst: Secondary | ICD-10-CM | POA: Diagnosis not present

## 2020-07-07 DIAGNOSIS — M109 Gout, unspecified: Secondary | ICD-10-CM | POA: Diagnosis not present

## 2020-07-07 DIAGNOSIS — R220 Localized swelling, mass and lump, head: Secondary | ICD-10-CM | POA: Diagnosis not present

## 2020-07-07 DIAGNOSIS — I4891 Unspecified atrial fibrillation: Secondary | ICD-10-CM | POA: Diagnosis not present

## 2020-07-07 HISTORY — PX: MASS EXCISION: SHX2000

## 2020-07-07 SURGERY — EXCISION MASS
Anesthesia: General | Site: Face | Laterality: Right

## 2020-07-07 MED ORDER — FENTANYL CITRATE (PF) 100 MCG/2ML IJ SOLN
INTRAMUSCULAR | Status: DC | PRN
  Administered 2020-07-07: 50 ug via INTRAVENOUS

## 2020-07-07 MED ORDER — FENTANYL CITRATE (PF) 100 MCG/2ML IJ SOLN
INTRAMUSCULAR | Status: AC
  Filled 2020-07-07: qty 2

## 2020-07-07 MED ORDER — CEFAZOLIN SODIUM-DEXTROSE 2-4 GM/100ML-% IV SOLN
2.0000 g | Freq: Once | INTRAVENOUS | Status: AC
  Administered 2020-07-07: 2 g via INTRAVENOUS

## 2020-07-07 MED ORDER — CLINDAMYCIN HCL 300 MG PO CAPS: 300.0000 mg | ORAL_CAPSULE | Freq: Three times a day (TID) | ORAL | 0 refills | Status: AC

## 2020-07-07 MED ORDER — LACTATED RINGERS IV SOLN: INTRAVENOUS | Status: DC

## 2020-07-07 MED ORDER — PROPOFOL 10 MG/ML IV BOLUS
INTRAVENOUS | Status: DC | PRN
  Administered 2020-07-07: 140 mg via INTRAVENOUS

## 2020-07-07 MED ORDER — LIDOCAINE HCL (CARDIAC) PF 100 MG/5ML IV SOSY
PREFILLED_SYRINGE | INTRAVENOUS | Status: DC | PRN
  Administered 2020-07-07: 60 mg via INTRATRACHEAL

## 2020-07-07 MED ORDER — ONDANSETRON HCL 4 MG/2ML IJ SOLN: 4.0000 mg | Freq: Once | INTRAMUSCULAR | Status: DC | PRN

## 2020-07-07 MED ORDER — OXYCODONE-ACETAMINOPHEN 5-325 MG PO TABS: 1.0000 | ORAL_TABLET | ORAL | 0 refills | Status: AC | PRN

## 2020-07-07 MED ORDER — ONDANSETRON HCL 4 MG/2ML IJ SOLN
INTRAMUSCULAR | Status: DC | PRN
  Administered 2020-07-07: 4 mg via INTRAVENOUS

## 2020-07-07 MED ORDER — HYDROMORPHONE HCL 1 MG/ML IJ SOLN
0.2500 mg | INTRAMUSCULAR | Status: DC | PRN
Start: 2020-07-07 — End: 2020-07-07

## 2020-07-07 MED ORDER — EPHEDRINE SULFATE 50 MG/ML IJ SOLN
INTRAMUSCULAR | Status: DC | PRN
Start: 2020-07-07 — End: 2020-07-07
  Administered 2020-07-07: 10 mg via INTRAVENOUS

## 2020-07-07 MED ORDER — LIDOCAINE-EPINEPHRINE 1 %-1:100000 IJ SOLN
INTRAMUSCULAR | Status: DC | PRN
  Administered 2020-07-07: 4 mL

## 2020-07-07 MED ORDER — ONDANSETRON HCL 4 MG/2ML IJ SOLN
INTRAMUSCULAR | Status: AC
  Filled 2020-07-07: qty 2

## 2020-07-07 MED ORDER — CEFAZOLIN SODIUM 1 G IJ SOLR
INTRAMUSCULAR | Status: AC
  Filled 2020-07-07: qty 20

## 2020-07-07 MED ORDER — DEXAMETHASONE SODIUM PHOSPHATE 10 MG/ML IJ SOLN
INTRAMUSCULAR | Status: DC | PRN
  Administered 2020-07-07: 10 mg via INTRAVENOUS

## 2020-07-07 MED ORDER — ACETAMINOPHEN 10 MG/ML IV SOLN: 1000.0000 mg | Freq: Once | INTRAVENOUS | Status: DC | PRN

## 2020-07-07 MED ORDER — PROPOFOL 10 MG/ML IV BOLUS
INTRAVENOUS | Status: AC
  Filled 2020-07-07: qty 20

## 2020-07-07 MED ORDER — LIDOCAINE 2% (20 MG/ML) 5 ML SYRINGE
INTRAMUSCULAR | Status: AC
  Filled 2020-07-07: qty 5

## 2020-07-07 SURGICAL SUPPLY — 59 items
ATTRACTOMAT 16X20 MAGNETIC DRP (DRAPES) IMPLANT
BLADE SURG 15 STRL LF DISP TIS (BLADE) ×1 IMPLANT
BLADE SURG 15 STRL SS (BLADE) ×2
CANISTER SUCT 1200ML W/VALVE (MISCELLANEOUS) IMPLANT
CORD BIPOLAR FORCEPS 12FT (ELECTRODE) IMPLANT
COVER BACK TABLE 60X90IN (DRAPES) ×2 IMPLANT
COVER MAYO STAND STRL (DRAPES) ×2 IMPLANT
COVER WAND RF STERILE (DRAPES) IMPLANT
DECANTER SPIKE VIAL GLASS SM (MISCELLANEOUS) IMPLANT
DERMABOND ADVANCED (GAUZE/BANDAGES/DRESSINGS) ×1
DERMABOND ADVANCED .7 DNX12 (GAUZE/BANDAGES/DRESSINGS) ×1 IMPLANT
DRAIN JACKSON RD 7FR 3/32 (WOUND CARE) IMPLANT
DRAIN PENROSE 1/4X12 LTX STRL (WOUND CARE) IMPLANT
DRAIN TLS ROUND 10FR (DRAIN) IMPLANT
DRAPE U-SHAPE 76X120 STRL (DRAPES) ×2 IMPLANT
ELECT COATED BLADE 2.86 ST (ELECTRODE) ×2 IMPLANT
ELECT NEEDLE BLADE 2-5/6 (NEEDLE) IMPLANT
ELECT PAIRED SUBDERMAL (MISCELLANEOUS)
ELECT REM PT RETURN 9FT ADLT (ELECTROSURGICAL) ×2
ELECTRODE PAIRED SUBDERMAL (MISCELLANEOUS) IMPLANT
ELECTRODE REM PT RTRN 9FT ADLT (ELECTROSURGICAL) ×1 IMPLANT
EVACUATOR SILICONE 100CC (DRAIN) IMPLANT
FORCEPS BIPOLAR SPETZLER 8 1.0 (NEUROSURGERY SUPPLIES) IMPLANT
GAUZE 4X4 16PLY RFD (DISPOSABLE) IMPLANT
GAUZE SPONGE 4X4 12PLY STRL LF (GAUZE/BANDAGES/DRESSINGS) IMPLANT
GLOVE BIO SURGEON STRL SZ7.5 (GLOVE) ×2 IMPLANT
GLOVE SURG SYN 7.5  E (GLOVE) ×2
GLOVE SURG SYN 7.5 E (GLOVE) ×1 IMPLANT
GOWN STRL REUS W/ TWL LRG LVL3 (GOWN DISPOSABLE) ×2 IMPLANT
GOWN STRL REUS W/TWL LRG LVL3 (GOWN DISPOSABLE) ×4
HEMOSTAT SURGICEL 2X14 (HEMOSTASIS) IMPLANT
LOCATOR NERVE 3 VOLT (DISPOSABLE) IMPLANT
NEEDLE HYPO 25X1 1.5 SAFETY (NEEDLE) ×2 IMPLANT
NS IRRIG 1000ML POUR BTL (IV SOLUTION) ×2 IMPLANT
PACK BASIN DAY SURGERY FS (CUSTOM PROCEDURE TRAY) ×2 IMPLANT
PENCIL SMOKE EVACUATOR (MISCELLANEOUS) ×2 IMPLANT
PIN SAFETY STERILE (MISCELLANEOUS) IMPLANT
PROBE NERVBE PRASS .33 (MISCELLANEOUS) IMPLANT
SHEARS HARMONIC 9CM CVD (BLADE) IMPLANT
SLEEVE SCD COMPRESS KNEE MED (MISCELLANEOUS) ×2 IMPLANT
SPONGE GAUZE 2X2 8PLY STRL LF (GAUZE/BANDAGES/DRESSINGS) IMPLANT
SUCTION FRAZIER HANDLE 10FR (MISCELLANEOUS)
SUCTION TUBE FRAZIER 10FR DISP (MISCELLANEOUS) IMPLANT
SUT ETHILON 3 0 PS 1 (SUTURE) IMPLANT
SUT ETHILON 5 0 P 3 18 (SUTURE)
SUT NYLON ETHILON 5-0 P-3 1X18 (SUTURE) IMPLANT
SUT PROLENE 4 0 P 3 18 (SUTURE) IMPLANT
SUT SILK 3 0 TIES 17X18 (SUTURE)
SUT SILK 3-0 18XBRD TIE BLK (SUTURE) IMPLANT
SUT SILK 4 0 TIES 17X18 (SUTURE) IMPLANT
SUT VIC AB 3-0 FS2 27 (SUTURE) IMPLANT
SUT VIC AB 4-0 P-3 18XBRD (SUTURE) IMPLANT
SUT VIC AB 4-0 P3 18 (SUTURE)
SUT VICRYL 4-0 PS2 18IN ABS (SUTURE) ×2 IMPLANT
SYR BULB EAR ULCER 3OZ GRN STR (SYRINGE) ×2 IMPLANT
SYR CONTROL 10ML LL (SYRINGE) ×2 IMPLANT
TOWEL GREEN STERILE FF (TOWEL DISPOSABLE) ×2 IMPLANT
TUBE CONNECTING 20X1/4 (TUBING) ×2 IMPLANT
YANKAUER SUCT BULB TIP NO VENT (SUCTIONS) IMPLANT

## 2020-07-07 NOTE — Anesthesia Postprocedure Evaluation (Signed)
Anesthesia Post Note  Patient: Todd [name]  Procedure(s) Performed: EXCISION OF RIGHT FACIAL MASS (Right Face)     Patient location during evaluation: PACU Anesthesia Type: General Level of consciousness: awake and alert Pain management: pain level controlled Vital Signs Assessment: post-procedure vital signs reviewed and stable Respiratory status: spontaneous breathing, nonlabored ventilation, respiratory function stable and patient connected to nasal cannula oxygen Cardiovascular status: blood pressure returned to baseline and stable Postop Assessment: no apparent nausea or vomiting Anesthetic complications: no   No complications documented.  Last Vitals:  Vitals:   07/07/20 1030 07/07/20 1041  BP: 129/77 130/73  Pulse: 85 86  Resp: 19 17  Temp:  36.4 C  SpO2: 100% 100%    Last Pain:  Vitals:   07/07/20 1039  TempSrc:   PainSc: 0-No pain                 Belenda Cruise P Cari Burgo

## 2020-07-07 NOTE — Discharge Instructions (Signed)
°  Post Anesthesia Home Care Instructions  Activity: Get plenty of rest for the remainder of the day. A responsible individual must stay with you for 24 hours following the procedure.  For the next 24 hours, DO NOT: -Drive a car -Paediatric nurse -Drink alcoholic beverages -Take any medication unless instructed by your physician -Make any legal decisions or sign important papers.  Meals: Start with liquid foods such as gelatin or soup. Progress to regular foods as tolerated. Avoid greasy, spicy, heavy foods. If nausea and/or vomiting occur, drink only clear liquids until the nausea and/or vomiting subsides. Call your physician if vomiting continues.  Special Instructions/Symptoms: Your throat may feel dry or sore from the anesthesia or the breathing tube placed in your throat during surgery. If this causes discomfort, gargle with warm salt water. The discomfort should disappear within 24 hours.  If you had a scopolamine patch placed behind your ear for the management of post- operative nausea and/or vomiting:  1. The medication in the patch is effective for 72 hours, after which it should be removed.  Wrap patch in a tissue and discard in the trash. Wash hands thoroughly with soap and water. 2. You may remove the patch earlier than 72 hours if you experience unpleasant side effects which may include dry mouth, dizziness or visual disturbances. 3. Avoid touching the patch. Wash your hands with soap and water after contact with the patch.    -------------------  The patient may resume his previous activities and diet.  No postop restrictions.  He may resume Plavix in 2 days.  The patient has a follow-up appointment in 1 week.

## 2020-07-07 NOTE — Anesthesia Procedure Notes (Signed)
Procedure Name: LMA Insertion Date/Time: 07/07/2020 9:36 AM Performed by: Glory Buff, CRNA Pre-anesthesia Checklist: Patient identified, Emergency Drugs available, Suction available and Patient being monitored Patient Re-evaluated:Patient Re-evaluated prior to induction Oxygen Delivery Method: Circle system utilized Preoxygenation: Pre-oxygenation with 100% oxygen Induction Type: IV induction LMA: LMA inserted LMA Size: 4.0 Number of attempts: 1 Placement Confirmation: positive ETCO2 Tube secured with: Tape Dental Injury: Teeth and Oropharynx as per pre-operative assessment

## 2020-07-07 NOTE — H&P (Signed)
Cc: Right facial mass  HPI: The patient is a 70 y/o  male who presents today for evaluation of a right facial mass. The patient is seen in consultation requested by [facility]. The patient first noted the mass a few years ago but it has been getting bigger in the past few months. The patient denies any associated pain. No drainage has been noted. The patient denies any throat symptoms. He is a 50+ pack year smoker.   The patient's review of systems (constitutional, eyes, ENT, cardiovascular, respiratory, GI, musculoskeletal, skin, neurologic, psychiatric, endocrine, hematologic, allergic) is noted in the ROS questionnaire.  It is reviewed with the patient.   Family health history: Heart disease.  Major events: Back, knee surgeries.  Ongoing medical problems: Hypertension, arthritis.  Social history: The patient is married. He does smoke one pack of cigarettes per day. He denies the use of alcohol or illegal drugs.  Exam: General: Communicates without difficulty, well nourished, no acute distress. Head: Normocephalic, no evidence injury, no tenderness, facial buttresses intact without stepoff. Eyes: PERRL, EOMI. No scleral icterus, conjunctivae clear. Neuro: CN II exam reveals vision grossly intact.  No nystagmus at any point of gaze. Ears: Auricles well formed without lesions.  Ear canals are intact without mass or lesion.  No erythema or edema is appreciated.  The TMs are intact without fluid. Nose: External evaluation reveals normal support and skin without lesions.  Dorsum is intact.  Anterior rhinoscopy reveals healthy pink mucosa over anterior aspect of inferior turbinates and intact septum.  No purulence noted. Oral:  Oral cavity and oropharynx are intact, symmetric, without erythema or edema.  Mucosa is moist without lesions. A 3cm firm right facial mass is noted.  Neck: Full range of motion without pain.  There is no significant lymphadenopathy.   Thyroid bed within normal limits  to palpation.  Parotid glands and submandibular glands equal bilaterally without mass.  Trachea is midline. Neuro:  CN 2-12 grossly intact. Gait normal. Vestibular: No nystagmus at any point of gaze. The cerebellar examination is unremarkable.   Assessment  1. A 3 cm firm subcutaneous right facial mass is noted. No surface ulceration is noted.  2. No other suspicious mass or lesion is noted on exam.   Plan  1. The physical exam findings are reviewed with the patient and his wife.  2. The options of conservative observation vs surgical excision are discussed. The risks, benefits, alternatives, and details of the procedure are reviewed with the patient. Questions are invited and answered. 3. The patient is interested in proceeding with the procedure.  We will schedule the procedure in accordance with the family schedule.

## 2020-07-07 NOTE — Op Note (Signed)
DATE OF PROCEDURE: 07/07/2020  OPERATIVE REPORT   SURGEON: Leta Baptist, MD  PREOPERATIVE DIAGNOSIS: Right facial mass  POSTOPERATIVE DIAGNOSIS: Right facial mass   PROCEDURES PERFORMED: Excision of right facial mass (3cm)  ANESTHESIA: General laryngeal mask anesthesia.  COMPLICATIONS: None.  ESTIMATED BLOOD LOSS: Minimal.  INDICATION FOR PROCEDURE:  Todd [name] is a 70 y.o. male a right facial mass. The patient first noted the mass a few years ago but it has been getting bigger in the past few months. The patient denies any associated pain. On examination, a 3 cm soft tissue mass was noted lateral to the right oral commissure.  Based on the above findings, the decision was made for the patient to undergo the above-stated procedure. The risks, benefits, alternatives, and details of the procedures were discussed with the patient. Questions were invited and answered. Informed consent was obtained.  DESCRIPTION OF PROCEDURE: The patient was taken to the operating room and placed supine on the operating table. General laryngeal mask anesthesia was induced by the anesthesiologist. The patient was positioned and prepped and draped in the standard fashion for facial surgery.  Examination of the face and reviewed 3 cm soft tissue mass lateral to the right oral commissure.  1% lidocaine with 1-100,000 epinephrine was infiltrated at the planned site of incision. A 3 cm vertical incision was made over the soft tissue mass. The incision was carried down past the facial fascia.  Careful dissection was then carried out to dissect soft tissue mass from the surrounding tissue.  The mass was ruptured.  The sebaceous material was expressed from the cystic mass.  The entire mass was excised and sent to the pathology department for permanent histologic identification.  The surgical site was copiously irrigated.  The incision was closed in layers with 4-0 Vicryl and Dermabond.  That  concluded the procedure for the patient. The care of the patient was turned over to the anesthesiologist. The patient was awakened from anesthesia without difficulty. He was extubated and transferred to the recovery room in good condition.  OPERATIVE FINDINGS:A 3 cm right facial mass, likely a sebaceous cyst.  SPECIMEN:Right facial mass.  FOLLOWUP CARE: The patient will be discharged home once he is awake and alert. He will follow up in my office in 1 week.

## 2020-07-07 NOTE — Anesthesia Preprocedure Evaluation (Addendum)
Anesthesia Evaluation  Patient identified by MRN, date of birth, ID band Patient awake    Reviewed: NPO status , Patient's Chart, lab work & pertinent test results, reviewed documented beta blocker date and time   Airway Mallampati: II  TM Distance: >3 FB Neck ROM: Full    Dental  (+) Teeth Intact   Pulmonary neg pulmonary ROS, Current Smoker,    Pulmonary exam normal        Cardiovascular hypertension, Pt. on medications and Pt. on home beta blockers + dysrhythmias Atrial Fibrillation  Rhythm:Irregular Rate:Normal     Neuro/Psych negative neurological ROS  negative psych ROS   GI/Hepatic (+)     substance abuse (h/x withdrawal and seizures)  alcohol use,   Endo/Other  negative endocrine ROS  Renal/GU negative Renal ROS  negative genitourinary   Musculoskeletal  (+) Arthritis , Osteoarthritis,    Abdominal (+)  Abdomen: soft. Bowel sounds: normal.  Peds  Hematology negative hematology ROS (+)   Anesthesia Other Findings   Reproductive/Obstetrics                             Anesthesia Physical Anesthesia Plan  ASA: III  Anesthesia Plan: General   Post-op Pain Management:    Induction: Intravenous  PONV Risk Score and Plan: 1 and Ondansetron, Dexamethasone and Treatment may vary due to age or medical condition  Airway Management Planned: Mask and Oral ETT  Additional Equipment: None  Intra-op Plan:   Post-operative Plan: Extubation in OR  Informed Consent: I have reviewed the patients History and Physical, chart, labs and discussed the procedure including the risks, benefits and alternatives for the proposed anesthesia with the patient or authorized representative who has indicated his/her understanding and acceptance.     Dental advisory given  Plan Discussed with: CRNA  Anesthesia Plan Comments: (Lab Results      Component                Value               Date                       NA                       139                 07/03/2020                K                        3.9                 07/03/2020                CO2                      23                  07/03/2020                GLUCOSE                  114 (H)             07/03/2020  BUN                      17                  07/03/2020                CREATININE               1.17                07/03/2020                CALCIUM                  9.0                 07/03/2020                GFRNONAA                 >60                 07/03/2020                GFRAA                                        10/06/2010            >60        The eGFR has been calculated using the MDRD equation. This calculation has not been validated in all clinical situations. eGFR's persistently <60 mL/min signify possible Chronic Kidney Disease.)        Anesthesia Quick Evaluation

## 2020-07-07 NOTE — Transfer of Care (Signed)
Immediate Anesthesia Transfer of Care Note  Patient: Todd [name]  Procedure(s) Performed: EXCISION OF RIGHT FACIAL MASS (Right Face)  Patient Location: PACU  Anesthesia Type:General  Level of Consciousness: drowsy, patient cooperative and responds to stimulation  Airway & Oxygen Therapy: Patient Spontanous Breathing and Patient connected to face mask oxygen  Post-op Assessment: Report given to RN and Post -op Vital signs reviewed and stable  Post vital signs: Reviewed and stable  Last Vitals:  Vitals Value Taken Time  BP    Temp    Pulse    Resp 16 07/07/20 1015  SpO2    Vitals shown include unvalidated device data.  Last Pain:  Vitals:   07/07/20 0811  TempSrc: Oral  PainSc: 0-No pain         Complications: No complications documented.

## 2020-07-07 NOTE — Telephone Encounter (Signed)
Patient already underwent procedure today

## 2020-07-08 ENCOUNTER — Encounter (HOSPITAL_BASED_OUTPATIENT_CLINIC_OR_DEPARTMENT_OTHER): Payer: Self-pay | Admitting: Otolaryngology

## 2020-07-08 LAB — SURGICAL PATHOLOGY

## 2020-07-23 ENCOUNTER — Other Ambulatory Visit: Payer: Self-pay

## 2020-07-23 ENCOUNTER — Encounter: Payer: Self-pay | Admitting: Vascular Surgery

## 2020-07-23 ENCOUNTER — Ambulatory Visit (INDEPENDENT_AMBULATORY_CARE_PROVIDER_SITE_OTHER)
Admission: RE | Admit: 2020-07-23 | Discharge: 2020-07-23 | Disposition: A | Discharge status: Home / Self Care | Payer: PPO | Source: Ambulatory Visit | Attending: Vascular Surgery | Admitting: Vascular Surgery

## 2020-07-23 ENCOUNTER — Ambulatory Visit (HOSPITAL_COMMUNITY)
Admission: RE | Admit: 2020-07-23 | Discharge: 2020-07-23 | Disposition: A | Discharge status: Home / Self Care | Payer: PPO | Source: Ambulatory Visit | Attending: Vascular Surgery | Admitting: Vascular Surgery

## 2020-07-23 ENCOUNTER — Ambulatory Visit (INDEPENDENT_AMBULATORY_CARE_PROVIDER_SITE_OTHER): Payer: PPO | Admitting: Vascular Surgery

## 2020-07-23 VITALS — BP 117/74 | HR 62 | Temp 98.1°F | Ht 68.0 in | Wt 128.5 lb

## 2020-07-23 DIAGNOSIS — I739 Peripheral vascular disease, unspecified: Secondary | ICD-10-CM

## 2020-07-23 NOTE — Progress Notes (Signed)
Patient is a 70 year old male who was last seen July 18, 2019.  He previously had bilateral external iliac artery stents.  This was in September 2020.  He has a known chronic right superficial femoral artery occlusion.  He is on a statin.  He had to stop taking his aspirin due to GI upset.  He remains on Plavix.  He reports no claudication symptoms.  He has no rest pain.  He is able to be very active in his garden and doing outdoor yard work.  Past Medical History:  Diagnosis Date  . Alcoholism ([place])    History of withdrawal and seizures  . Atrial fibrillation ([place])    Taken off of Coumadin in 2009 in the setting of GI bleed  . Chronic back pain   . Colitis    4/11  . Essential hypertension   . Gout   . History of GI bleed   . Internal hemorrhoids    Colonoscopy 11/09  . Osteoarthritis   . Schatzki's ring    EGD 11/09    Review of systems: He has no shortness of breath.  He has no chest pain.  Physical exam:  Vitals:   07/23/20 0909  BP: 117/74  Pulse: 62  Temp: 98.1 F (36.7 C)  TempSrc: Skin  SpO2: 100%  Weight: 128 lb 8 oz (58.3 kg)  Height: 5\' 8"  (1.727 m)    Abdomen: Soft nontender nondistended no pulsatile mass  Extremities: 2+ femoral pulses absent popliteal and pedal pulses  Data: Patient had bilateral ABIs performed today which I reviewed and interpreted.  Right side was 0.6 left side was 0.6.  This is slightly decreased from his last ABI 1 year ago.  Patient also had lower extremity arterial duplex exam today which showed no evidence of stenosis in his external iliac stents.  Assessment: Patent bilateral external iliac artery stents currently asymptomatic no claudication symptoms  Plan: Patient will continue his Plavix and statin.  He will try to ambulate as much as possible.  He will call us if he develops recurrent claudication symptoms.  Otherwise he will follow up with [place] in 1 year with repeat aortoiliac duplex scan and bilateral ABIs.  Ruta Hinds,  MD [facility] [place] Office: [phone]

## 2020-07-29 DIAGNOSIS — N529 Male erectile dysfunction, unspecified: Secondary | ICD-10-CM | POA: Diagnosis not present

## 2020-07-29 DIAGNOSIS — Z1389 Encounter for screening for other disorder: Secondary | ICD-10-CM | POA: Diagnosis not present

## 2020-07-29 DIAGNOSIS — D649 Anemia, unspecified: Secondary | ICD-10-CM | POA: Diagnosis not present

## 2020-07-29 DIAGNOSIS — Z0001 Encounter for general adult medical examination with abnormal findings: Secondary | ICD-10-CM | POA: Diagnosis not present

## 2020-07-29 DIAGNOSIS — M109 Gout, unspecified: Secondary | ICD-10-CM | POA: Diagnosis not present

## 2020-07-29 DIAGNOSIS — I4891 Unspecified atrial fibrillation: Secondary | ICD-10-CM | POA: Diagnosis not present

## 2020-07-29 DIAGNOSIS — Z1331 Encounter for screening for depression: Secondary | ICD-10-CM | POA: Diagnosis not present

## 2020-07-29 DIAGNOSIS — Z6821 Body mass index (BMI) 21.0-21.9, adult: Secondary | ICD-10-CM | POA: Diagnosis not present

## 2020-07-29 DIAGNOSIS — E7849 Other hyperlipidemia: Secondary | ICD-10-CM | POA: Diagnosis not present

## 2020-07-29 DIAGNOSIS — I10 Essential (primary) hypertension: Secondary | ICD-10-CM | POA: Diagnosis not present

## 2020-07-29 DIAGNOSIS — F17290 Nicotine dependence, other tobacco product, uncomplicated: Secondary | ICD-10-CM | POA: Diagnosis not present

## 2020-08-24 DIAGNOSIS — H524 Presbyopia: Secondary | ICD-10-CM | POA: Diagnosis not present

## 2020-08-24 DIAGNOSIS — Z961 Presence of intraocular lens: Secondary | ICD-10-CM | POA: Diagnosis not present

## 2020-09-07 ENCOUNTER — Other Ambulatory Visit: Payer: Self-pay

## 2020-09-07 ENCOUNTER — Other Ambulatory Visit: Payer: Self-pay | Admitting: Vascular Surgery

## 2020-09-07 MED ORDER — ROSUVASTATIN CALCIUM 10 MG PO TABS: 10.0000 mg | ORAL_TABLET | Freq: Every day | ORAL | 11 refills | Status: DC

## 2020-12-31 ENCOUNTER — Other Ambulatory Visit: Payer: Self-pay | Admitting: Cardiology

## 2021-02-26 ENCOUNTER — Other Ambulatory Visit: Payer: Self-pay

## 2021-02-26 ENCOUNTER — Other Ambulatory Visit (HOSPITAL_COMMUNITY): Payer: PPO

## 2021-02-26 ENCOUNTER — Ambulatory Visit: Payer: PPO | Admitting: Physician Assistant

## 2021-02-26 ENCOUNTER — Ambulatory Visit (HOSPITAL_COMMUNITY)
Admission: RE | Admit: 2021-02-26 | Discharge: 2021-02-26 | Disposition: A | Discharge status: Home / Self Care | Payer: PPO | Source: Ambulatory Visit | Attending: Vascular Surgery | Admitting: Vascular Surgery

## 2021-02-26 ENCOUNTER — Telehealth: Payer: Self-pay

## 2021-02-26 VITALS — BP 105/72 | HR 70 | Temp 98.0°F | Resp 20 | Ht 68.0 in | Wt 128.0 lb

## 2021-02-26 DIAGNOSIS — I739 Peripheral vascular disease, unspecified: Secondary | ICD-10-CM | POA: Diagnosis not present

## 2021-02-26 DIAGNOSIS — I70229 Atherosclerosis of native arteries of extremities with rest pain, unspecified extremity: Secondary | ICD-10-CM

## 2021-02-26 MED ORDER — OXYCODONE-ACETAMINOPHEN 10-325 MG PO TABS: 1.0000 | ORAL_TABLET | Freq: Four times a day (QID) | ORAL | 0 refills | Status: DC | PRN

## 2021-02-26 NOTE — Progress Notes (Signed)
Office Note     CC:  follow up Requesting Provider:  Redmond School, MD  HPI: Todd [name] is a 70 y.o. (24-Feb-1951) male who presents with 3-4 week history of worsening RLE pain. He says pain started with ambulation worse along his anterior shin accompanied now with foot numbness and right hip pain.  He says the pain is now such that he cannot sleep. Able to ambulate with cane. His wife accompanies him today.  Was last seen July 23, 2020 by Dr. Oneida Alar. ABIs were 0.60 bilaterally. Annual follow-up recommended. He previously had bilateral external iliac artery stents.  This was in September 2020.  He has a known chronic right superficial femoral artery occlusion.  He is on a statin.  He had to stop taking his aspirin due to GI upset.  He remains on Plavix.  Continues to smoke 1/2 ppd.   Past Medical History:  Diagnosis Date   Alcoholism [facility])    History of withdrawal and seizures   Atrial fibrillation ([place])    Taken off of Coumadin in 2009 in the setting of GI bleed   Chronic back pain    Colitis    4/11   Essential hypertension    Gout    History of GI bleed    Internal hemorrhoids    Colonoscopy 11/09   Osteoarthritis    Schatzki's ring    EGD 11/09    Past Surgical History:  Procedure Laterality Date   ABDOMINAL AORTOGRAM N/A 03/08/2019   Procedure: ABDOMINAL AORTOGRAM;  Surgeon: Elam Dutch, MD;  Location: [place] [facility];  Service: Cardiovascular;  Laterality: N/A;   Excision of gouty lesion     Left index finger   LOWER EXTREMITY ANGIOGRAPHY Bilateral 03/08/2019   Procedure: Lower Extremity Angiography;  Surgeon: Elam Dutch, MD;  Location: [place] [facility];  Service: Cardiovascular;  Laterality: Bilateral;   MASS EXCISION Right 07/07/2020   Procedure: EXCISION OF RIGHT FACIAL MASS;  Surgeon: Leta Baptist, MD;  Location: [place];  Service: ENT;  Laterality: Right;   PERIPHERAL VASCULAR INTERVENTION Bilateral 03/08/2019   Procedure:  PERIPHERAL VASCULAR INTERVENTION;  Surgeon: Elam Dutch, MD;  Location: [place] [facility];  Service: Cardiovascular;  Laterality: Bilateral;  ext iliac artery stent   Right knee arthroscopy     SPINE SURGERY      Social History   Socioeconomic History   Marital status: Married    Spouse name: Not on file   Number of children: Not on file   Years of education: Not on file   Highest education level: Not on file  Occupational History   Occupation: Retired    Fish farm manager: [employer]  Tobacco Use   Smoking status: Every Day    Packs/day: 0.50    Types: Cigarettes   Smokeless tobacco: Never  Vaping Use   Vaping Use: Never used  Substance and Sexual Activity   Alcohol use: No    Alcohol/week: 0.0 standard drinks    Comment: History of heavy alcohol use, most recent quit attempt has lasted 3 months, occassionally drinks alcohol   Drug use: No   Sexual activity: Never  Other Topics Concern   Not on file  Social History Narrative   Not on file   Social Determinants of Health   Financial Resource Strain: Not on file  Food Insecurity: Not on file  Transportation Needs: Not on file  Physical Activity: Not on file  Stress: Not on file  Social Connections: Not on file  Intimate Partner Violence: Not on file   Family History  Problem Relation Age of Onset   Coronary artery disease Father    Coronary artery disease Sister        MI at age 85    Current Outpatient Medications  Medication Sig Dispense Refill   amLODipine (NORVASC) 5 MG tablet Take 5 mg by mouth daily with breakfast.      clopidogrel (PLAVIX) 75 MG tablet TAKE ONE TABLET BY MOUTH ONCE DAILY. 90 tablet 2   metoprolol-hydrochlorothiazide (LOPRESSOR HCT) 50-25 MG tablet Take 1 tablet by mouth daily with breakfast.      Omega-3 Fatty Acids (FISH OIL) 1000 MG CAPS Take 1,000 mg by mouth daily.      rosuvastatin (CRESTOR) 10 MG tablet TAKE (1) TABLET BY MOUTH ONCE DAILY. 30 tablet 0   rosuvastatin  (CRESTOR) 10 MG tablet Take 1 tablet (10 mg total) by mouth daily. 30 tablet 11   No current facility-administered medications for this visit.    No Known Allergies   REVIEW OF SYSTEMS:   '[X]'$  denotes positive finding, '[ ]'$  denotes negative finding Cardiac  Comments:  Chest pain or chest pressure:    Shortness of breath upon exertion:    Short of breath when lying flat:    Irregular heart rhythm:        Vascular    Pain in calf, thigh, or hip brought on by ambulation: x   Pain in feet at night that wakes you up from your sleep:  x   Blood clot in your veins:    Leg swelling:         Pulmonary    Oxygen at home:    Productive cough:     Wheezing:         Neurologic    Sudden weakness in arms or legs:     Sudden numbness in arms or legs:     Sudden onset of difficulty speaking or slurred speech:    Temporary loss of vision in one eye:     Problems with dizziness:         Gastrointestinal    Blood in stool:     Vomited blood:         Genitourinary    Burning when urinating:     Blood in urine:        Psychiatric    Major depression:         Hematologic    Bleeding problems:    Problems with blood clotting too easily:        Skin    Rashes or ulcers:        Constitutional    Fever or chills:      PHYSICAL EXAMINATION:  Vitals:   02/26/21 1415  BP: 105/72  Pulse: 70  Resp: 20  Temp: 98 F (36.7 C)  SpO2: 93%  Weight: 128 lb (58.1 kg)  Height: '5\' 8"'$  (1.727 m)    General:  WDWN in NAD; vital signs documented above Gait: unaided, mild ataxia HENT: WNL, normocephalic Pulmonary: normal non-labored breathing , without Rales, rhonchi,  wheezing Cardiac: regular HR,  Skin: without rashes Vascular Exam/Pulses: No right Doppler signals. + left DP and PT signals Extremities: with ischemic changes>shiny skin, purple discoloration of right toes and forefoot cool to touch. , without Gangrene , without cellulitis; without open wounds; Small (38m) ulcer noted of  right first metatarsal head Musculoskeletal: no muscle wasting or atrophy.  Able to wiggle right toes but decreased as compared to left.   Neurologic: A&O X 3;  No focal weakness or paresthesias are detected Psychiatric:  The pt has Normal affect.     Non-Invasive Vascular Imaging:   02/26/2021 ABI/TBIToday's ABIToday's TBIPrevious ABIPrevious TBI  +-------+-----------+-----------+------------+------------+  Right  absent     absent     0.59        0.4           +-------+-----------+-----------+------------+------------+  Left   0.68       0.2        0.63        0.31          +-------+-----------+-----------+------------+------------+     Previous ABI on 07/23/20. Bilateral iliac stents were patent on the same  date.     Findings reported to Patient has an appointment with PA following this  exam. .   Summary:  Right: Resting right ankle-brachial index indicates critical limb  ischemia.   Left: Resting left ankle-brachial index indicates moderate left lower  extremity arterial disease. The left toe-brachial index is abnormal.       *See table(s) above for measurements and observations.       Preliminary     ASSESSMENT/PLAN:: 70 y.o. male here for acute on chronic arterial insufficiency. Now with RLE rest pain, unable to detect right pedal Doppler signals. Plan arteriogram on Tuesday.  Left foot ulcer. Left ABIs preserved. Counseled regarding smoking cessation. Will prescribe Percocet to help with pain at night until procedure performed.  Barbie Banner, PA-C [facility] [phone]  Clinic MD:   Dr. Scot Dock on-call

## 2021-02-26 NOTE — Telephone Encounter (Signed)
Patient calls today to report that over the last 2 weeks, his right leg has become dark, painful and "2-3 degrees cooler than the other one." It hurts all the time and he says it is getting worse. He is s/p bil iliac stenting in 2020 by [facility]. Bringing in patient today for ABIs and evaluation.

## 2021-02-26 NOTE — H&P (View-Only) (Signed)
Office Note     CC:  follow up Requesting Provider:  Redmond School, MD  HPI: Todd [name] is a 70 y.o. (07-Oct-1950) male who presents with 3-4 week history of worsening RLE pain. He says pain started with ambulation worse along his anterior shin accompanied now with foot numbness and right hip pain.  He says the pain is now such that he cannot sleep. Able to ambulate with cane. His wife accompanies him today.  Was last seen July 23, 2020 by Dr. Oneida Alar. ABIs were 0.60 bilaterally. Annual follow-up recommended. He previously had bilateral external iliac artery stents.  This was in September 2020.  He has a known chronic right superficial femoral artery occlusion.  He is on a statin.  He had to stop taking his aspirin due to GI upset.  He remains on Plavix.  Continues to smoke 1/2 ppd.   Past Medical History:  Diagnosis Date   Alcoholism [facility])    History of withdrawal and seizures   Atrial fibrillation ([place])    Taken off of Coumadin in 2009 in the setting of GI bleed   Chronic back pain    Colitis    4/11   Essential hypertension    Gout    History of GI bleed    Internal hemorrhoids    Colonoscopy 11/09   Osteoarthritis    Schatzki's ring    EGD 11/09    Past Surgical History:  Procedure Laterality Date   ABDOMINAL AORTOGRAM N/A 03/08/2019   Procedure: ABDOMINAL AORTOGRAM;  Surgeon: Elam Dutch, MD;  Location: [place] [facility];  Service: Cardiovascular;  Laterality: N/A;   Excision of gouty lesion     Left index finger   LOWER EXTREMITY ANGIOGRAPHY Bilateral 03/08/2019   Procedure: Lower Extremity Angiography;  Surgeon: Elam Dutch, MD;  Location: [place] [facility];  Service: Cardiovascular;  Laterality: Bilateral;   MASS EXCISION Right 07/07/2020   Procedure: EXCISION OF RIGHT FACIAL MASS;  Surgeon: Leta Baptist, MD;  Location: [place];  Service: ENT;  Laterality: Right;   PERIPHERAL VASCULAR INTERVENTION Bilateral 03/08/2019   Procedure:  PERIPHERAL VASCULAR INTERVENTION;  Surgeon: Elam Dutch, MD;  Location: [place] [facility];  Service: Cardiovascular;  Laterality: Bilateral;  ext iliac artery stent   Right knee arthroscopy     SPINE SURGERY      Social History   Socioeconomic History   Marital status: Married    Spouse name: Not on file   Number of children: Not on file   Years of education: Not on file   Highest education level: Not on file  Occupational History   Occupation: Retired    Fish farm manager: [employer]  Tobacco Use   Smoking status: Every Day    Packs/day: 0.50    Types: Cigarettes   Smokeless tobacco: Never  Vaping Use   Vaping Use: Never used  Substance and Sexual Activity   Alcohol use: No    Alcohol/week: 0.0 standard drinks    Comment: History of heavy alcohol use, most recent quit attempt has lasted 3 months, occassionally drinks alcohol   Drug use: No   Sexual activity: Never  Other Topics Concern   Not on file  Social History Narrative   Not on file   Social Determinants of Health   Financial Resource Strain: Not on file  Food Insecurity: Not on file  Transportation Needs: Not on file  Physical Activity: Not on file  Stress: Not on file  Social Connections: Not on file  Intimate Partner Violence: Not on file   Family History  Problem Relation Age of Onset   Coronary artery disease Father    Coronary artery disease Sister        MI at age 24    Current Outpatient Medications  Medication Sig Dispense Refill   amLODipine (NORVASC) 5 MG tablet Take 5 mg by mouth daily with breakfast.      clopidogrel (PLAVIX) 75 MG tablet TAKE ONE TABLET BY MOUTH ONCE DAILY. 90 tablet 2   metoprolol-hydrochlorothiazide (LOPRESSOR HCT) 50-25 MG tablet Take 1 tablet by mouth daily with breakfast.      Omega-3 Fatty Acids (FISH OIL) 1000 MG CAPS Take 1,000 mg by mouth daily.      rosuvastatin (CRESTOR) 10 MG tablet TAKE (1) TABLET BY MOUTH ONCE DAILY. 30 tablet 0   rosuvastatin  (CRESTOR) 10 MG tablet Take 1 tablet (10 mg total) by mouth daily. 30 tablet 11   No current facility-administered medications for this visit.    No Known Allergies   REVIEW OF SYSTEMS:   '[X]'$  denotes positive finding, '[ ]'$  denotes negative finding Cardiac  Comments:  Chest pain or chest pressure:    Shortness of breath upon exertion:    Short of breath when lying flat:    Irregular heart rhythm:        Vascular    Pain in calf, thigh, or hip brought on by ambulation: x   Pain in feet at night that wakes you up from your sleep:  x   Blood clot in your veins:    Leg swelling:         Pulmonary    Oxygen at home:    Productive cough:     Wheezing:         Neurologic    Sudden weakness in arms or legs:     Sudden numbness in arms or legs:     Sudden onset of difficulty speaking or slurred speech:    Temporary loss of vision in one eye:     Problems with dizziness:         Gastrointestinal    Blood in stool:     Vomited blood:         Genitourinary    Burning when urinating:     Blood in urine:        Psychiatric    Major depression:         Hematologic    Bleeding problems:    Problems with blood clotting too easily:        Skin    Rashes or ulcers:        Constitutional    Fever or chills:      PHYSICAL EXAMINATION:  Vitals:   02/26/21 1415  BP: 105/72  Pulse: 70  Resp: 20  Temp: 98 F (36.7 C)  SpO2: 93%  Weight: 128 lb (58.1 kg)  Height: '5\' 8"'$  (1.727 m)    General:  WDWN in NAD; vital signs documented above Gait: unaided, mild ataxia HENT: WNL, normocephalic Pulmonary: normal non-labored breathing , without Rales, rhonchi,  wheezing Cardiac: regular HR,  Skin: without rashes Vascular Exam/Pulses: No right Doppler signals. + left DP and PT signals Extremities: with ischemic changes>shiny skin, purple discoloration of right toes and forefoot cool to touch. , without Gangrene , without cellulitis; without open wounds; Small (64m) ulcer noted of  right first metatarsal head Musculoskeletal: no muscle wasting or atrophy.  Able to wiggle right toes but decreased as compared to left.   Neurologic: A&O X 3;  No focal weakness or paresthesias are detected Psychiatric:  The pt has Normal affect.     Non-Invasive Vascular Imaging:   02/26/2021 ABI/TBIToday's ABIToday's TBIPrevious ABIPrevious TBI  +-------+-----------+-----------+------------+------------+  Right  absent     absent     0.59        0.4           +-------+-----------+-----------+------------+------------+  Left   0.68       0.2        0.63        0.31          +-------+-----------+-----------+------------+------------+     Previous ABI on 07/23/20. Bilateral iliac stents were patent on the same  date.     Findings reported to Patient has an appointment with PA following this  exam. .   Summary:  Right: Resting right ankle-brachial index indicates critical limb  ischemia.   Left: Resting left ankle-brachial index indicates moderate left lower  extremity arterial disease. The left toe-brachial index is abnormal.       *See table(s) above for measurements and observations.       Preliminary     ASSESSMENT/PLAN:: 70 y.o. male here for acute on chronic arterial insufficiency. Now with RLE rest pain, unable to detect right pedal Doppler signals. Plan arteriogram on Tuesday.  Left foot ulcer. Left ABIs preserved. Counseled regarding smoking cessation. Will prescribe Percocet to help with pain at night until procedure performed.  Barbie Banner, PA-C [facility] [phone]  Clinic MD:   Dr. Scot Dock on-call

## 2021-03-02 ENCOUNTER — Other Ambulatory Visit: Payer: Self-pay

## 2021-03-02 ENCOUNTER — Ambulatory Visit (HOSPITAL_COMMUNITY)
Admission: RE | Admit: 2021-03-02 | Discharge: 2021-03-02 | Disposition: A | Discharge status: Home / Self Care | Payer: PPO | Attending: Surgery | Admitting: Surgery

## 2021-03-02 ENCOUNTER — Encounter (HOSPITAL_COMMUNITY)
Admission: RE | Disposition: A | Discharge status: Home / Self Care | Payer: Self-pay | Source: Home / Self Care | Attending: Surgery

## 2021-03-02 DIAGNOSIS — F17210 Nicotine dependence, cigarettes, uncomplicated: Secondary | ICD-10-CM | POA: Diagnosis not present

## 2021-03-02 DIAGNOSIS — Z8249 Family history of ischemic heart disease and other diseases of the circulatory system: Secondary | ICD-10-CM | POA: Insufficient documentation

## 2021-03-02 DIAGNOSIS — I701 Atherosclerosis of renal artery: Secondary | ICD-10-CM | POA: Insufficient documentation

## 2021-03-02 DIAGNOSIS — Z79899 Other long term (current) drug therapy: Secondary | ICD-10-CM | POA: Diagnosis not present

## 2021-03-02 DIAGNOSIS — I70245 Atherosclerosis of native arteries of left leg with ulceration of other part of foot: Secondary | ICD-10-CM | POA: Diagnosis not present

## 2021-03-02 DIAGNOSIS — I70221 Atherosclerosis of native arteries of extremities with rest pain, right leg: Secondary | ICD-10-CM | POA: Diagnosis not present

## 2021-03-02 DIAGNOSIS — L97529 Non-pressure chronic ulcer of other part of left foot with unspecified severity: Secondary | ICD-10-CM | POA: Insufficient documentation

## 2021-03-02 DIAGNOSIS — Z7902 Long term (current) use of antithrombotics/antiplatelets: Secondary | ICD-10-CM | POA: Insufficient documentation

## 2021-03-02 HISTORY — PX: ABDOMINAL AORTOGRAM W/LOWER EXTREMITY: CATH118223

## 2021-03-02 HISTORY — PX: PERIPHERAL VASCULAR BALLOON ANGIOPLASTY: CATH118281

## 2021-03-02 HISTORY — PX: PERIPHERAL VASCULAR INTERVENTION: CATH118257

## 2021-03-02 LAB — POCT I-STAT, CHEM 8
BUN: 14 mg/dL (ref 8–23)
Calcium, Ion: 1.09 mmol/L — ABNORMAL LOW (ref 1.15–1.40)
Chloride: 102 mmol/L (ref 98–111)
Creatinine, Ser: 1.2 mg/dL (ref 0.61–1.24)
Glucose, Bld: 83 mg/dL (ref 70–99)
HCT: 40 % (ref 39.0–52.0)
Hemoglobin: 13.6 g/dL (ref 13.0–17.0)
Potassium: 3.6 mmol/L (ref 3.5–5.1)
Sodium: 139 mmol/L (ref 135–145)
TCO2: 25 mmol/L (ref 22–32)

## 2021-03-02 LAB — POCT ACTIVATED CLOTTING TIME
Activated Clotting Time: 254 seconds
Activated Clotting Time: 167 seconds

## 2021-03-02 SURGERY — ABDOMINAL AORTOGRAM W/LOWER EXTREMITY
Anesthesia: LOCAL | Laterality: Right

## 2021-03-02 MED ORDER — HEPARIN SODIUM (PORCINE) 1000 UNIT/ML IJ SOLN
INTRAMUSCULAR | Status: DC | PRN
  Administered 2021-03-02: 7000 [IU] via INTRAVENOUS

## 2021-03-02 MED ORDER — SODIUM CHLORIDE 0.9% FLUSH: 3.0000 mL | INTRAVENOUS | Status: DC | PRN

## 2021-03-02 MED ORDER — MORPHINE SULFATE (PF) 2 MG/ML IV SOLN: 2.0000 mg | INTRAVENOUS | Status: DC | PRN

## 2021-03-02 MED ORDER — ACETAMINOPHEN 325 MG PO TABS: 650.0000 mg | ORAL_TABLET | ORAL | Status: DC | PRN

## 2021-03-02 MED ORDER — PROTAMINE SULFATE 10 MG/ML IV SOLN
50.0000 mg | Freq: Once | INTRAVENOUS | Status: AC
  Administered 2021-03-02: 50 mg via INTRAVENOUS

## 2021-03-02 MED ORDER — OXYCODONE HCL 5 MG PO TABS: 5.0000 mg | ORAL_TABLET | ORAL | Status: DC | PRN

## 2021-03-02 MED ORDER — FENTANYL CITRATE (PF) 100 MCG/2ML IJ SOLN
INTRAMUSCULAR | Status: DC | PRN
  Administered 2021-03-02: 25 ug via INTRAVENOUS
  Administered 2021-03-02: 50 ug via INTRAVENOUS

## 2021-03-02 MED ORDER — SODIUM CHLORIDE 0.9 % IV SOLN: 250.0000 mL | INTRAVENOUS | Status: DC | PRN

## 2021-03-02 MED ORDER — MIDAZOLAM HCL 2 MG/2ML IJ SOLN
INTRAMUSCULAR | Status: DC | PRN
  Administered 2021-03-02: 1 mg via INTRAVENOUS
  Administered 2021-03-02: 2 mg via INTRAVENOUS

## 2021-03-02 MED ORDER — FENTANYL CITRATE (PF) 100 MCG/2ML IJ SOLN
INTRAMUSCULAR | Status: AC
  Filled 2021-03-02: qty 2

## 2021-03-02 MED ORDER — LIDOCAINE HCL (PF) 1 % IJ SOLN
INTRAMUSCULAR | Status: DC | PRN
  Administered 2021-03-02: 15 mL via INTRADERMAL

## 2021-03-02 MED ORDER — ONDANSETRON HCL 4 MG/2ML IJ SOLN: 4.0000 mg | Freq: Four times a day (QID) | INTRAMUSCULAR | Status: DC | PRN

## 2021-03-02 MED ORDER — LABETALOL HCL 5 MG/ML IV SOLN
10.0000 mg | INTRAVENOUS | Status: DC | PRN
Start: 2021-03-02 — End: 2021-03-03

## 2021-03-02 MED ORDER — IODIXANOL 320 MG/ML IV SOLN
INTRAVENOUS | Status: DC | PRN
  Administered 2021-03-02: 185 mL via INTRA_ARTERIAL

## 2021-03-02 MED ORDER — MIDAZOLAM HCL 2 MG/2ML IJ SOLN
INTRAMUSCULAR | Status: AC
  Filled 2021-03-02: qty 2

## 2021-03-02 MED ORDER — HEPARIN (PORCINE) IN NACL 1000-0.9 UT/500ML-% IV SOLN
INTRAVENOUS | Status: AC
  Filled 2021-03-02: qty 500

## 2021-03-02 MED ORDER — HEPARIN (PORCINE) IN NACL 1000-0.9 UT/500ML-% IV SOLN
INTRAVENOUS | Status: DC | PRN
  Administered 2021-03-02 (×2): 500 mL

## 2021-03-02 MED ORDER — SODIUM CHLORIDE 0.9% FLUSH: 3.0000 mL | Freq: Two times a day (BID) | INTRAVENOUS | Status: DC

## 2021-03-02 MED ORDER — SODIUM CHLORIDE 0.9 % IV SOLN: INTRAVENOUS | Status: DC

## 2021-03-02 MED ORDER — HYDRALAZINE HCL 20 MG/ML IJ SOLN: 5.0000 mg | INTRAMUSCULAR | Status: DC | PRN

## 2021-03-02 MED ORDER — PROTAMINE SULFATE 10 MG/ML IV SOLN
INTRAVENOUS | Status: AC
  Filled 2021-03-02: qty 5

## 2021-03-02 MED ORDER — LIDOCAINE HCL (PF) 1 % IJ SOLN
INTRAMUSCULAR | Status: AC
  Filled 2021-03-02: qty 30

## 2021-03-02 MED ORDER — SODIUM CHLORIDE 0.9 % WEIGHT BASED INFUSION
1.0000 mL/kg/h | INTRAVENOUS | Status: DC
  Administered 2021-03-02: 1 mL/kg/h via INTRAVENOUS

## 2021-03-02 MED ORDER — CLOPIDOGREL BISULFATE 75 MG PO TABS: 75.0000 mg | ORAL_TABLET | Freq: Every day | ORAL | Status: DC

## 2021-03-02 SURGICAL SUPPLY — 21 items
BALLN STERLING OTW 6X60X135 (BALLOONS) ×4
BALLOON STERLING OTW 6X60X135 (BALLOONS) ×3 IMPLANT
CATH OMNI FLUSH 5F 65CM (CATHETERS) ×4 IMPLANT
CATH STRAIGHT 5FR 65CM (CATHETERS) ×4 IMPLANT
DCB RANGER 7.0X40 135 (BALLOONS) ×3 IMPLANT
DEVICE TORQUE H2O (MISCELLANEOUS) ×4 IMPLANT
GUIDEWIRE ANGLED .035X150CM (WIRE) ×4 IMPLANT
KIT ENCORE 26 ADVANTAGE (KITS) ×4 IMPLANT
KIT MICROPUNCTURE NIT STIFF (SHEATH) ×4 IMPLANT
KIT PV (KITS) ×4 IMPLANT
RANGER DCB 7.0X40 135 (BALLOONS) ×4
SHEATH PINNACLE 5F 10CM (SHEATH) ×4 IMPLANT
SHEATH PINNACLE 6F 10CM (SHEATH) ×4 IMPLANT
SHEATH PINNACLE ST 6F 45CM (SHEATH) ×4 IMPLANT
SHEATH PROBE COVER 6X72 (BAG) ×4 IMPLANT
STENT ELUVIA 7X60X130 (Permanent Stent) ×4 IMPLANT
SYR MEDRAD MARK V 150ML (SYRINGE) ×4 IMPLANT
TRANSDUCER W/STOPCOCK (MISCELLANEOUS) ×4 IMPLANT
TRAY PV CATH (CUSTOM PROCEDURE TRAY) ×4 IMPLANT
WIRE G V18X300CM (WIRE) ×4 IMPLANT
WIRE STARTER BENTSON 035X150 (WIRE) ×4 IMPLANT

## 2021-03-02 NOTE — Op Note (Signed)
Patient name: Todd [name] MRN: [id number] DOB: [birth year] Sex: male  03/02/2021 Pre-operative Diagnosis: Right leg rest pain, left foot ulcer Post-operative diagnosis:  Same Surgeon:  Annamarie Major Procedure Performed:  1.  Ultrasound-guided access, left femoral artery  2.  Abdominal aortogram  3.  Bilateral lower extremity runoff  4.  Drug-coated balloon angioplasty, right external iliac artery  5.  Stent, left external iliac artery  6.  Conscious sedation, 64 minutes   Indications: This is a 70 year old gentleman who has previously undergone iliac stenting by Dr. Oneida Alar.  He returns with rest pain in the right foot and a ulcer on the left great toe.  He states that his ulcer has been making good progress but his right leg continues to bother him the most.  We discussed proceeding with right leg intervention initially.  Procedure:  The patient was identified in the holding area and taken to room 8.  The patient was then placed supine on the table and prepped and draped in the usual sterile fashion.  A time out was called.  Conscious sedation was administered with the use of IV fentanyl and Versed under continuous physician and nurse monitoring.  Heart rate, blood pressure, and oxygen saturation were continuously monitored.  Total sedation time was 64 minutes.  Ultrasound was used to evaluate the left common femoral artery.  It was patent .  A digital ultrasound image was acquired.  A micropuncture needle was used to access the left common femoral artery under ultrasound guidance.  An 018 wire was advanced without resistance and a micropuncture sheath was placed.  The 018 wire was removed and a benson wire was placed.  The micropuncture sheath was exchanged for a 5 french sheath.  An omniflush catheter was advanced over the wire to the level of L-1.  An abdominal angiogram was obtained.  Next, bilateral lower extremity runoff was performed Findings:   Aortogram: Approximate 60% right  renal artery stenosis and 70-80% left renal artery stenosis.  The infrarenal abdominal aorta is patent without significant stenosis.  The right common iliac stent is patent with mild luminal narrowing.  There are high-grade lesions, greater than 90% within the right external iliac stent.  Similarly there is a stenosis just distal to the external iliac stent on the left, approximately 80%.  Right Lower Extremity: The right common femoral profundofemoral artery are widely patent.  There is a flush occlusion of the right superficial femoral artery with reconstitution of the above-knee popliteal artery the below-knee popliteal artery is patent with a dominant runoff vessel across the ankle being the posterior tibial artery  Left Lower Extremity: The right common femoral artery shows moderate stenosis.  There is a high origin of the profundofemoral artery.  The superficial femoral artery appears to be patent down the leg with the posterior tibial being the dominant runoff  Intervention: After above images were acquired the decision was made to proceed with intervention.  A 6 French 45 cm sheath was advanced into the right external iliac artery.  The patient was fully heparinized.  I advanced a Glidewire through the stenosis within the right external iliac stent and exchanged out for a V-18 wire.  I then selected a 7 x 40 drug-coated Ranger balloon to perform balloon angioplasty of the stent within the external iliac artery.  Completion imaging showed less than 10% stenosis at the distal stent.  The remaining portion was widely patent.  I elected not to intervene any further due to concerns  of possible dissection with simple balloon angioplasty.  I then turned attention towards the left external iliac stent.  There did appear to be a stenosis distal to the previously placed stent and so I elected to place a another stent.  A 7 x 60 Elluvia stent was deployed coming down to the top of the femoral head.  It was  postdilated with a 6 mm balloon.  Completion imaging revealed residual stenosis within the proximal common femoral artery which I elected not to treat.  The treated segment was widely patent.  A short sheath was then inserted and the patient be taken the holding area for sheath pull once his coagulation profile corrects.  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  This was treated with a 7 mm drug-coated Ranger balloon with residual stenosis less than 10%  #3  Flush occlusion of the right superficial femoral artery with reconstitution of the above-knee popliteal artery and posterior tibial runoff.  At the patient continues to have difficulty, he would require a femoral to above-knee popliteal bypass  #4  Left external iliac stenosis approximately 80% successfully stented using a 7 mm Elluvia stent  #5  Residual common femoral stenosis with high origin of the profundofemoral artery.  If the patient continues to have difficulty, he would require left femoral endarterectomy.  At that time he would also need to have formal angiography of the left superficial femoral artery to see if there are any residual lesions that need to be addressed.   Theotis Burrow, M.D., [facility] [place] Office: [phone] Pager:  [phone]

## 2021-03-02 NOTE — Interval H&P Note (Signed)
History and Physical Interval Note:  03/02/2021 1:40 PM  Todd [name]  has presented today for surgery, with the diagnosis of wrist pain.  The various methods of treatment have been discussed with the patient and family. After consideration of risks, benefits and other options for treatment, the patient has consented to  Procedure(s): ABDOMINAL AORTOGRAM W/LOWER EXTREMITY (N/A) as a surgical intervention.  The patient's history has been reviewed, patient examined, no change in status, stable for surgery.  I have reviewed the patient's chart and labs.  Questions were answered to the patient's satisfaction.     Annamarie Major

## 2021-03-02 NOTE — Progress Notes (Signed)
Site Area:  LFA Site prior to removal: Level 0 Pressure applied for:  20 minutes Manual:  yes Pt status during pull:  stable Post pull site:  level 0 Post pull instructions given:  yes Post pull pulses present:  doppler PT Dressing applied:  gauze/tegaderm Bedrest begins @:  17:00 Comments: Removed by Suella Broad RN

## 2021-03-03 ENCOUNTER — Encounter (HOSPITAL_COMMUNITY): Payer: Self-pay | Admitting: Surgery

## 2021-03-04 ENCOUNTER — Encounter (HOSPITAL_COMMUNITY): Payer: Self-pay | Admitting: Surgery

## 2021-03-08 ENCOUNTER — Telehealth: Payer: Self-pay

## 2021-03-08 ENCOUNTER — Other Ambulatory Visit: Payer: Self-pay

## 2021-03-08 DIAGNOSIS — I739 Peripheral vascular disease, unspecified: Secondary | ICD-10-CM

## 2021-03-08 NOTE — Telephone Encounter (Signed)
Patient calls today to report pain and blister right second toe s/p aortogram on 9/8. Discussed with Dr. Trula Slade - patient will see Dr. Donnetta Hutching for vein mapping on Wednesday.

## 2021-03-10 ENCOUNTER — Ambulatory Visit: Payer: PPO | Admitting: Vascular Surgery

## 2021-03-10 ENCOUNTER — Ambulatory Visit (INDEPENDENT_AMBULATORY_CARE_PROVIDER_SITE_OTHER): Payer: PPO

## 2021-03-10 ENCOUNTER — Other Ambulatory Visit: Payer: Self-pay

## 2021-03-10 ENCOUNTER — Encounter: Payer: Self-pay | Admitting: Vascular Surgery

## 2021-03-10 VITALS — BP 103/66 | HR 70 | Temp 97.0°F | Ht 67.0 in | Wt 145.0 lb

## 2021-03-10 DIAGNOSIS — I739 Peripheral vascular disease, unspecified: Secondary | ICD-10-CM

## 2021-03-10 NOTE — H&P (View-Only) (Signed)
[facility] [place]  Patient name: Todd [name] MRN: [id number] DOB: [birth year] Sex: male  REASON FOR VISIT: Follow-up peripheral vascular disease and vein mapped for potential right femoropopliteal bypass  HPI: Todd [name] is a 70 y.o. male here today with his wife.  He has a long history of bilateral lower extremity arterial insufficiency.  He recently underwent aortogram via left femoral approach and bilateral external iliac intervention.  He has had continued rest pain in his right foot.  He reports blistering on his toes of his right foot and is seen today for evaluation of this and also for vein mapping and preparation of femoropopliteal bypass.  He reports classic rest pain.  He is comfortable walking to some degree during the day but has pain in his right foot at night which awakens him.  Past Medical History:  Diagnosis Date   Alcoholism [facility])    History of withdrawal and seizures   Atrial fibrillation ([place])    Taken off of Coumadin in 2009 in the setting of GI bleed   Chronic back pain    Colitis    4/11   Essential hypertension    Gout    History of GI bleed    Internal hemorrhoids    Colonoscopy 11/09   Osteoarthritis    Schatzki's ring    EGD 11/09    Family History  Problem Relation Age of Onset   Coronary artery disease Father    Coronary artery disease Sister        MI at age 9    SOCIAL HISTORY: Social History   Tobacco Use   Smoking status: Every Day    Packs/day: 0.50    Types: Cigarettes   Smokeless tobacco: Never  Substance Use Topics   Alcohol use: No    Alcohol/week: 0.0 standard drinks    Comment: History of heavy alcohol use, most recent quit attempt has lasted 3 months, occassionally drinks alcohol    No Known Allergies  Current Outpatient Medications  Medication Sig Dispense Refill   amLODipine (NORVASC) 5 MG tablet Take 5 mg by mouth daily with breakfast.       clopidogrel (PLAVIX) 75 MG tablet TAKE ONE TABLET BY MOUTH ONCE DAILY. (Patient taking differently: Take 75 mg by mouth daily with supper.) 90 tablet 2   metoprolol-hydrochlorothiazide (LOPRESSOR HCT) 50-25 MG tablet Take 1 tablet by mouth 3 (three) times daily.     Omega-3 Fatty Acids (FISH OIL) 1000 MG CAPS Take 1,000 mg by mouth daily.      rosuvastatin (CRESTOR) 10 MG tablet TAKE (1) TABLET BY MOUTH ONCE DAILY. 30 tablet 0   oxyCODONE-acetaminophen (PERCOCET) 10-325 MG tablet Take 1 tablet by mouth every 6 (six) hours as needed for pain. (Patient not taking: No sig reported) 10 tablet 0   No current facility-administered medications for this visit.    REVIEW OF SYSTEMS:  '[X]'$  denotes positive finding, '[ ]'$  denotes negative finding Cardiac  Comments:  Chest pain or chest pressure:    Shortness of breath upon exertion:    Short of breath when lying flat:    Irregular heart rhythm:        Vascular    Pain in calf, thigh, or hip brought on by ambulation: x   Pain in feet at night that wakes you up from your sleep:  x   Blood clot in your veins:    Leg swelling:  PHYSICAL EXAM: Vitals:   03/10/21 1316  BP: 103/66  Pulse: 70  Temp: (!) 97 F (36.1 C)  TempSrc: Tympanic  SpO2: 97%  Weight: 145 lb (65.8 kg)  Height: '5\' 7"'$  (1.702 m)    GENERAL: The patient is a well-nourished male, in no acute distress. The vital signs are documented above. CARDIOVASCULAR: 2+ femoral pulses bilaterally.  No hematoma in the left groin.  Absent popliteal pulses bilaterally PULMONARY: There is good air exchange  MUSCULOSKELETAL: There are no major deformities or cyanosis. NEUROLOGIC: No focal weakness or paresthesias are detected. SKIN: He does have a superficial skin loss on the right second toe.  This is an area where a blister had burst. PSYCHIATRIC: The patient has a normal affect.  DATA:  Vein map today shows good caliber saphenous vein at over 3 mm from the saphenofemoral junction  distally.  MEDICAL ISSUES: Rest pain with critical limb ischemia.  Good inflow with palpable right femoral pulse.  Discussed with the patient and his wife plan for right femoral to popliteal bypass with Dr. Trula Slade on 03/12/2021.  He already has his preadmission testing scheduled for tomorrow at [place], MD [facility] Tel [phone]  Note: Portions of this report may have been transcribed using voice recognition software.  Every effort has been made to ensure accuracy; however, inadvertent computerized transcription errors may still be present.

## 2021-03-10 NOTE — Progress Notes (Signed)
Surgical Instructions   Your procedure is scheduled on Friday 03/12/2021.  Report to [facility] Main Entrance "A" at 08:50 A.M., then check in with the Admitting office.  Call [phone] if you have problems or questions between now and the morning of surgery:   Remember: Do not eat or drink after midnight the night before your surgery   Take these medicines the morning of surgery with A SIP OF WATER:  Amlodipine (Norvasc) Rosuvastatin (Crestor)   If needed you may take these medications the morning of surgery:  Follow your surgeon's instructions on when to stop Clopidogrel (Plavix).  If no instructions were given by your surgeon then you will need to call the office to get those instructions.     As of today, STOP taking any Aspirin (unless otherwise instructed by your surgeon) or Aspirin-containing products; NSAIDS - Aleve, Naproxen, Ibuprofen, Motrin, Advil, Goody's, BC's, all herbal medications, fish oil, and all vitamins.          Do not wear jewelry Do not wear lotions, powders, colognes, or deodorant. Do not shave 48 hours prior to surgery.  Men may shave face and neck. Do not bring valuables to the hospital - [facility] is not responsible for any belongings or valuables.              Do NOT Smoke (Tobacco/Vaping) or drink Alcohol 24 hours prior to your procedure  If you use a CPAP at night, you may bring all equipment for your overnight stay.   Contacts, glasses, hearing aids, dentures or partials may not be worn into surgery, please bring cases for these belongings   For patients admitted to the hospital, discharge time will be determined by your treatment team.   Patients discharged the day of surgery will not be allowed to drive home, and someone needs to stay with them for 24 hours.  ONLY ONE (1) SUPPORT PERSON MAY WAIT IN THE WAITING AREA WHILE YOU ARE IN SURGERY. NO VISITORS WILL BE ALLOWED IN PRE-OP WHERE PATIENTS GET READY FOR SURGERY.  TWO (2) VISITORS WILL  BE ALLOWED IN YOUR ROOM IF YOU ARE ADMITTED AFTER SURGERY.  Minor children may have two parents present. Special consideration for safety and communication needs will be reviewed on a case by case basis.  Special instructions:    Oral Hygiene is also important to reduce your risk of infection.  Remember - BRUSH YOUR TEETH THE MORNING OF SURGERY WITH YOUR REGULAR TOOTHPASTE   [place]- Preparing For Surgery  Before surgery, you can play an important role. Because skin is not sterile, your skin needs to be as free of germs as possible. You can reduce the number of germs on your skin by washing with CHG (chlorahexidine gluconate) Soap before surgery.  CHG is an antiseptic cleaner which kills germs and bonds with the skin to continue killing germs even after washing.     Please do not use if you have an allergy to CHG or antibacterial soaps. If your skin becomes reddened/irritated stop using the CHG.  Do not shave (including legs and underarms) for at least 48 hours prior to first CHG shower. It is OK to shave your face.  Please follow these instructions carefully.     Shower the NIGHT BEFORE SURGERY and the MORNING OF SURGERY with CHG Soap.   If you chose to wash your hair, wash your hair first as usual with your normal shampoo. After you shampoo, rinse your hair and body thoroughly to remove  the shampoo.    Then [employer] and genitals (private parts) with your normal soap and rinse thoroughly to remove soap.  Next use the CHG Soap as you would any other liquid soap. You can apply CHG directly to the skin and wash gently with a clean washcloth.   Apply the CHG Soap to your body ONLY FROM THE NECK DOWN.  Do not use on open wounds or open sores. Avoid contact with your eyes, ears, mouth and genitals (private parts). Wash Face and genitals (private parts)  with your normal soap.   Wash thoroughly, paying special attention to the area where your surgery will be performed.  Thoroughly rinse  your body with warm water from the neck down.  DO NOT shower/wash with your normal soap after using and rinsing off the CHG Soap.  Pat yourself dry with a CLEAN TOWEL.  Wear CLEAN PAJAMAS to bed the night before surgery  Place CLEAN SHEETS on your bed the night before your surgery  DO NOT SLEEP WITH PETS.   Day of Surgery:  Take a shower with CHG soap. Wear Clean/Comfortable clothing the morning of surgery Do not apply any deodorants/lotions.   Remember to brush your teeth WITH YOUR REGULAR TOOTHPASTE.   Please read over the following fact sheets that you were given.

## 2021-03-10 NOTE — Progress Notes (Signed)
[facility] [place]  Patient name: Todd [name] MRN: [id number] DOB: 1951/04/04 Sex: male  REASON FOR VISIT: Follow-up peripheral vascular disease and vein mapped for potential right femoropopliteal bypass  HPI: Todd [name] is a 70 y.o. male here today with his wife.  He has a long history of bilateral lower extremity arterial insufficiency.  He recently underwent aortogram via left femoral approach and bilateral external iliac intervention.  He has had continued rest pain in his right foot.  He reports blistering on his toes of his right foot and is seen today for evaluation of this and also for vein mapping and preparation of femoropopliteal bypass.  He reports classic rest pain.  He is comfortable walking to some degree during the day but has pain in his right foot at night which awakens him.  Past Medical History:  Diagnosis Date   Alcoholism [facility]    History of withdrawal and seizures   Atrial fibrillation ([place])    Taken off of Coumadin in 2009 in the setting of GI bleed   Chronic back pain    Colitis    4/11   Essential hypertension    Gout    History of GI bleed    Internal hemorrhoids    Colonoscopy 11/09   Osteoarthritis    Schatzki's ring    EGD 11/09    Family History  Problem Relation Age of Onset   Coronary artery disease Father    Coronary artery disease Sister        MI at age 58    SOCIAL HISTORY: Social History   Tobacco Use   Smoking status: Every Day    Packs/day: 0.50    Types: Cigarettes   Smokeless tobacco: Never  Substance Use Topics   Alcohol use: No    Alcohol/week: 0.0 standard drinks    Comment: History of heavy alcohol use, most recent quit attempt has lasted 3 months, occassionally drinks alcohol    No Known Allergies  Current Outpatient Medications  Medication Sig Dispense Refill   amLODipine (NORVASC) 5 MG tablet Take 5 mg by mouth daily with breakfast.       clopidogrel (PLAVIX) 75 MG tablet TAKE ONE TABLET BY MOUTH ONCE DAILY. (Patient taking differently: Take 75 mg by mouth daily with supper.) 90 tablet 2   metoprolol-hydrochlorothiazide (LOPRESSOR HCT) 50-25 MG tablet Take 1 tablet by mouth 3 (three) times daily.     Omega-3 Fatty Acids (FISH OIL) 1000 MG CAPS Take 1,000 mg by mouth daily.      rosuvastatin (CRESTOR) 10 MG tablet TAKE (1) TABLET BY MOUTH ONCE DAILY. 30 tablet 0   oxyCODONE-acetaminophen (PERCOCET) 10-325 MG tablet Take 1 tablet by mouth every 6 (six) hours as needed for pain. (Patient not taking: No sig reported) 10 tablet 0   No current facility-administered medications for this visit.    REVIEW OF SYSTEMS:  '[X]'$  denotes positive finding, '[ ]'$  denotes negative finding Cardiac  Comments:  Chest pain or chest pressure:    Shortness of breath upon exertion:    Short of breath when lying flat:    Irregular heart rhythm:        Vascular    Pain in calf, thigh, or hip brought on by ambulation: x   Pain in feet at night that wakes you up from your sleep:  x   Blood clot in your veins:    Leg swelling:  PHYSICAL EXAM: Vitals:   03/10/21 1316  BP: 103/66  Pulse: 70  Temp: (!) 97 F (36.1 C)  TempSrc: Tympanic  SpO2: 97%  Weight: 145 lb (65.8 kg)  Height: '5\' 7"'$  (1.702 m)    GENERAL: The patient is a well-nourished male, in no acute distress. The vital signs are documented above. CARDIOVASCULAR: 2+ femoral pulses bilaterally.  No hematoma in the left groin.  Absent popliteal pulses bilaterally PULMONARY: There is good air exchange  MUSCULOSKELETAL: There are no major deformities or cyanosis. NEUROLOGIC: No focal weakness or paresthesias are detected. SKIN: He does have a superficial skin loss on the right second toe.  This is an area where a blister had burst. PSYCHIATRIC: The patient has a normal affect.  DATA:  Vein map today shows good caliber saphenous vein at over 3 mm from the saphenofemoral junction  distally.  MEDICAL ISSUES: Rest pain with critical limb ischemia.  Good inflow with palpable right femoral pulse.  Discussed with the patient and his wife plan for right femoral to popliteal bypass with Dr. Trula Slade on 03/12/2021.  He already has his preadmission testing scheduled for tomorrow at [place], MD [facility] Tel [phone]  Note: Portions of this report may have been transcribed using voice recognition software.  Every effort has been made to ensure accuracy; however, inadvertent computerized transcription errors may still be present.

## 2021-03-11 ENCOUNTER — Encounter (HOSPITAL_COMMUNITY)
Admission: RE | Admit: 2021-03-11 | Discharge: 2021-03-11 | Disposition: A | Discharge status: Home / Self Care | Payer: PPO | Source: Ambulatory Visit | Attending: Surgery | Admitting: Surgery

## 2021-03-11 ENCOUNTER — Other Ambulatory Visit: Payer: Self-pay

## 2021-03-11 ENCOUNTER — Encounter (HOSPITAL_COMMUNITY): Payer: Self-pay

## 2021-03-11 DIAGNOSIS — I10 Essential (primary) hypertension: Secondary | ICD-10-CM | POA: Diagnosis present

## 2021-03-11 DIAGNOSIS — I739 Peripheral vascular disease, unspecified: Secondary | ICD-10-CM | POA: Diagnosis present

## 2021-03-11 DIAGNOSIS — I743 Embolism and thrombosis of arteries of the lower extremities: Secondary | ICD-10-CM | POA: Diagnosis not present

## 2021-03-11 DIAGNOSIS — Z5309 Procedure and treatment not carried out because of other contraindication: Secondary | ICD-10-CM | POA: Diagnosis not present

## 2021-03-11 DIAGNOSIS — F1020 Alcohol dependence, uncomplicated: Secondary | ICD-10-CM | POA: Diagnosis present

## 2021-03-11 DIAGNOSIS — T82858A Stenosis of vascular prosthetic devices, implants and grafts, initial encounter: Secondary | ICD-10-CM | POA: Diagnosis not present

## 2021-03-11 DIAGNOSIS — D62 Acute posthemorrhagic anemia: Secondary | ICD-10-CM | POA: Diagnosis not present

## 2021-03-11 DIAGNOSIS — Z8249 Family history of ischemic heart disease and other diseases of the circulatory system: Secondary | ICD-10-CM | POA: Diagnosis not present

## 2021-03-11 DIAGNOSIS — Z95828 Presence of other vascular implants and grafts: Secondary | ICD-10-CM | POA: Diagnosis not present

## 2021-03-11 DIAGNOSIS — I70202 Unspecified atherosclerosis of native arteries of extremities, left leg: Secondary | ICD-10-CM | POA: Diagnosis not present

## 2021-03-11 DIAGNOSIS — Y828 Other medical devices associated with adverse incidents: Secondary | ICD-10-CM | POA: Diagnosis not present

## 2021-03-11 DIAGNOSIS — Z7902 Long term (current) use of antithrombotics/antiplatelets: Secondary | ICD-10-CM | POA: Diagnosis not present

## 2021-03-11 DIAGNOSIS — T82868A Thrombosis of vascular prosthetic devices, implants and grafts, initial encounter: Secondary | ICD-10-CM | POA: Diagnosis not present

## 2021-03-11 DIAGNOSIS — I4891 Unspecified atrial fibrillation: Secondary | ICD-10-CM | POA: Diagnosis present

## 2021-03-11 DIAGNOSIS — Y838 Other surgical procedures as the cause of abnormal reaction of the patient, or of later complication, without mention of misadventure at the time of the procedure: Secondary | ICD-10-CM | POA: Diagnosis not present

## 2021-03-11 DIAGNOSIS — Z01812 Encounter for preprocedural laboratory examination: Secondary | ICD-10-CM | POA: Insufficient documentation

## 2021-03-11 DIAGNOSIS — D696 Thrombocytopenia, unspecified: Secondary | ICD-10-CM | POA: Diagnosis present

## 2021-03-11 DIAGNOSIS — M109 Gout, unspecified: Secondary | ICD-10-CM | POA: Diagnosis present

## 2021-03-11 DIAGNOSIS — K222 Esophageal obstruction: Secondary | ICD-10-CM | POA: Diagnosis present

## 2021-03-11 DIAGNOSIS — Z20822 Contact with and (suspected) exposure to covid-19: Secondary | ICD-10-CM | POA: Diagnosis present

## 2021-03-11 DIAGNOSIS — I4821 Permanent atrial fibrillation: Secondary | ICD-10-CM | POA: Insufficient documentation

## 2021-03-11 DIAGNOSIS — T82898A Other specified complication of vascular prosthetic devices, implants and grafts, initial encounter: Secondary | ICD-10-CM | POA: Diagnosis not present

## 2021-03-11 DIAGNOSIS — Z79899 Other long term (current) drug therapy: Secondary | ICD-10-CM | POA: Diagnosis not present

## 2021-03-11 DIAGNOSIS — M1990 Unspecified osteoarthritis, unspecified site: Secondary | ICD-10-CM | POA: Diagnosis present

## 2021-03-11 DIAGNOSIS — I70221 Atherosclerosis of native arteries of extremities with rest pain, right leg: Secondary | ICD-10-CM | POA: Diagnosis present

## 2021-03-11 DIAGNOSIS — K2970 Gastritis, unspecified, without bleeding: Secondary | ICD-10-CM | POA: Diagnosis not present

## 2021-03-11 DIAGNOSIS — L97529 Non-pressure chronic ulcer of other part of left foot with unspecified severity: Secondary | ICD-10-CM | POA: Diagnosis present

## 2021-03-11 DIAGNOSIS — F17210 Nicotine dependence, cigarettes, uncomplicated: Secondary | ICD-10-CM | POA: Diagnosis present

## 2021-03-11 HISTORY — DX: Peripheral vascular disease, unspecified: I73.9

## 2021-03-11 LAB — URINALYSIS, ROUTINE W REFLEX MICROSCOPIC
Bilirubin Urine: NEGATIVE
Glucose, UA: NEGATIVE mg/dL
Hgb urine dipstick: NEGATIVE
Ketones, ur: NEGATIVE mg/dL
Leukocytes,Ua: NEGATIVE
Nitrite: NEGATIVE
Protein, ur: NEGATIVE mg/dL
Specific Gravity, Urine: 1.02 (ref 1.005–1.030)
pH: 5 (ref 5.0–8.0)

## 2021-03-11 LAB — SARS CORONAVIRUS 2 (TAT 6-24 HRS): SARS Coronavirus 2: NEGATIVE

## 2021-03-11 LAB — COMPREHENSIVE METABOLIC PANEL
ALT: 11 U/L (ref 0–44)
AST: 20 U/L (ref 15–41)
Albumin: 3.4 g/dL — ABNORMAL LOW (ref 3.5–5.0)
Alkaline Phosphatase: 73 U/L (ref 38–126)
Anion gap: 13 (ref 5–15)
BUN: 12 mg/dL (ref 8–23)
CO2: 22 mmol/L (ref 22–32)
Calcium: 8.5 mg/dL — ABNORMAL LOW (ref 8.9–10.3)
Chloride: 99 mmol/L (ref 98–111)
Creatinine, Ser: 1.14 mg/dL (ref 0.61–1.24)
GFR, Estimated: >60 mL/min (ref >60)
Glucose, Bld: 80 mg/dL (ref 70–99)
Potassium: 3.5 mmol/L (ref 3.5–5.1)
Sodium: 134 mmol/L — ABNORMAL LOW (ref 135–145)
Total Bilirubin: 0.7 mg/dL (ref 0.3–1.2)
Total Protein: 7.1 g/dL (ref 6.5–8.1)

## 2021-03-11 LAB — CBC
HCT: 38 % — ABNORMAL LOW (ref 39.0–52.0)
Hemoglobin: 12.1 g/dL — ABNORMAL LOW (ref 13.0–17.0)
MCH: 29.7 pg (ref 26.0–34.0)
MCHC: 31.8 g/dL (ref 30.0–36.0)
MCV: 93.1 fL (ref 80.0–100.0)
Platelets: 255 10*3/uL (ref 150–400)
RBC: 4.08 MIL/uL — ABNORMAL LOW (ref 4.22–5.81)
RDW: 14.5 % (ref 11.5–15.5)
WBC: 8 10*3/uL (ref 4.0–10.5)
nRBC: 0 % (ref 0.0–0.2)

## 2021-03-11 LAB — APTT: aPTT: 38 seconds — ABNORMAL HIGH (ref 24–36)

## 2021-03-11 LAB — PROTIME-INR
INR: 1 (ref 0.8–1.2)
Prothrombin Time: 13.6 seconds (ref 11.4–15.2)

## 2021-03-11 LAB — SURGICAL PCR SCREEN
MRSA, PCR: NEGATIVE
Staphylococcus aureus: NEGATIVE

## 2021-03-11 NOTE — Progress Notes (Signed)
PCP: Redmond School, MD Cardiologist: Rozann Lesches, Md  EKG: 04/17/20 CXR: na ECHO: denies Stress Test: denies Cardiac Cath: denies  Fasting Blood Sugar- na Checks Blood Sugar__na_ times a day  OSA/CPAP: No  ASA: No Blood Thinner: Patient states he was told to continue Plavix.  He had a sent placed in his leg 03/02/21.  Covid test 03/11/21 at PAT  Anesthesia Review: yes, blood thinner, cardiac history and recent surgery.  Patient denies shortness of breath, fever, cough, and chest pain at PAT appointment.  Patient verbalized understanding of instructions provided today at the PAT appointment.  Patient asked to review instructions at home and day of surgery.

## 2021-03-11 NOTE — Progress Notes (Signed)
Anesthesia Chart Review:  Follows with cardiology for history of longstanding permanent A. fib.  He was previously on Coumadin but taken off in 2009 in setting of GI bleed.  Echo 2009 was essentially normal.  He is currently on Plavix secondary to iliac stenting on 03/02/2021.  Last seen by cardiologist Dr. Domenic Polite 04/17/2020, stable at that time, recommended 1 year follow-up.  Recently underwent excision of right facial mass [id number] without complication.  Preop labs reviewed, unremarkable.   EKG 04/17/2020: Atrial fibrillation.  Rate 74.  Rightward axis.  Echo 2009: 1. Technically adequate echocardiographic study.  2. Mild left atrial enlargement; normal right atrial size.  3. The right ventricle is prominent but not clearly enlarged; no RVH;      normal RV systolic function.  4. Trileaflet and normal aortic valve.  Normal proximal ascending      aorta.  5. Normal mitral valve; very mild regurgitation.  6. Normal tricuspid valve; physiologic regurgitation.  7. Normal pulmonic valve and proximal pulmonary artery.  8. Normal left ventricular size; mild hypertrophy; hyperdynamic      regional and global function.  9. Normal IVC.  10.Minimal posterior pericardial effusion   Wynonia Musty [facility]/Anesthesiology Phone [phone] 03/11/2021 12:32 PM

## 2021-03-11 NOTE — Anesthesia Preprocedure Evaluation (Addendum)
Anesthesia Evaluation  Patient identified by MRN, date of birth, ID band Patient awake    Reviewed: Allergy & Precautions, H&P , NPO status , Patient's Chart, lab work & pertinent test results  Airway Mallampati: II   Neck ROM: full    Dental   Pulmonary Current Smoker and Patient abstained from smoking.,    breath sounds clear to auscultation       Cardiovascular hypertension, + Peripheral Vascular Disease  + dysrhythmias Atrial Fibrillation  Rhythm:irregular Rate:Normal     Neuro/Psych    GI/Hepatic (+)     substance abuse  alcohol use,   Endo/Other    Renal/GU      Musculoskeletal  (+) Arthritis ,   Abdominal   Peds  Hematology   Anesthesia Other Findings   Reproductive/Obstetrics                            Anesthesia Physical Anesthesia Plan  ASA: 3  Anesthesia Plan: General   Post-op Pain Management:    Induction: Intravenous  PONV Risk Score and Plan: 1 and Ondansetron, Dexamethasone and Treatment may vary due to age or medical condition  Airway Management Planned: Oral ETT  Additional Equipment:   Intra-op Plan:   Post-operative Plan: Extubation in OR  Informed Consent: I have reviewed the patients History and Physical, chart, labs and discussed the procedure including the risks, benefits and alternatives for the proposed anesthesia with the patient or authorized representative who has indicated his/her understanding and acceptance.     Dental advisory given  Plan Discussed with: CRNA, Anesthesiologist and Surgeon  Anesthesia Plan Comments: (PAT note by Karoline Caldwell, PA-C: Follows with cardiology for history of longstanding permanent A. fib.  He was previously on Coumadin but taken off in 2009 in setting of GI bleed.  Echo 2009 was essentially normal.  He is currently on Plavix secondary to iliac stenting on 03/02/2021.  Last seen by cardiologist Dr. Domenic Polite  04/17/2020, stable at that time, recommended 1 year follow-up.  Recently underwent excision of right facial mass [id number] without complication.  Preop labs reviewed, unremarkable.   EKG 04/17/2020: Atrial fibrillation.  Rate 74.  Rightward axis.  Echo 2009: 1. Technically adequate echocardiographic study. 2. Mild left atrial enlargement; normal right atrial size. 3. The right ventricle is prominent but not clearly enlarged; no RVH; normal RV systolic function. 4. Trileaflet and normal aortic valve. Normal proximal ascending aorta. 5. Normal mitral valve; very mild regurgitation. 6. Normal tricuspid valve; physiologic regurgitation. 7. Normal pulmonic valve and proximal pulmonary artery. 8. Normal left ventricular size; mild hypertrophy; hyperdynamic regional and global function. 9. Normal IVC. 10.Minimal posterior pericardial effusion )       Anesthesia Quick Evaluation

## 2021-03-12 ENCOUNTER — Encounter (HOSPITAL_COMMUNITY)
Admission: RE | Disposition: A | Discharge status: Home Health | Payer: Self-pay | Source: Home / Self Care | Attending: Surgery

## 2021-03-12 ENCOUNTER — Inpatient Hospital Stay (HOSPITAL_COMMUNITY): Payer: PPO

## 2021-03-12 ENCOUNTER — Inpatient Hospital Stay (HOSPITAL_COMMUNITY): Payer: PPO | Admitting: Physician Assistant

## 2021-03-12 ENCOUNTER — Inpatient Hospital Stay (HOSPITAL_COMMUNITY)
Admission: RE | Admit: 2021-03-12 | Discharge: 2021-03-16 | DRG: 271 | Disposition: A | Discharge status: Home Health | Payer: PPO | Attending: Surgery | Admitting: Surgery

## 2021-03-12 ENCOUNTER — Inpatient Hospital Stay (HOSPITAL_COMMUNITY): Payer: PPO | Admitting: Certified Registered Nurse Anesthetist

## 2021-03-12 ENCOUNTER — Other Ambulatory Visit: Payer: Self-pay | Admitting: *Deleted

## 2021-03-12 ENCOUNTER — Encounter (HOSPITAL_COMMUNITY): Payer: Self-pay | Admitting: Surgery

## 2021-03-12 DIAGNOSIS — I10 Essential (primary) hypertension: Secondary | ICD-10-CM | POA: Diagnosis present

## 2021-03-12 DIAGNOSIS — Z5309 Procedure and treatment not carried out because of other contraindication: Secondary | ICD-10-CM | POA: Diagnosis not present

## 2021-03-12 DIAGNOSIS — I70229 Atherosclerosis of native arteries of extremities with rest pain, unspecified extremity: Secondary | ICD-10-CM

## 2021-03-12 DIAGNOSIS — M109 Gout, unspecified: Secondary | ICD-10-CM | POA: Diagnosis present

## 2021-03-12 DIAGNOSIS — F1020 Alcohol dependence, uncomplicated: Secondary | ICD-10-CM | POA: Diagnosis present

## 2021-03-12 DIAGNOSIS — D696 Thrombocytopenia, unspecified: Secondary | ICD-10-CM | POA: Diagnosis present

## 2021-03-12 DIAGNOSIS — I70221 Atherosclerosis of native arteries of extremities with rest pain, right leg: Principal | ICD-10-CM | POA: Diagnosis present

## 2021-03-12 DIAGNOSIS — L97529 Non-pressure chronic ulcer of other part of left foot with unspecified severity: Secondary | ICD-10-CM | POA: Diagnosis present

## 2021-03-12 DIAGNOSIS — I70202 Unspecified atherosclerosis of native arteries of extremities, left leg: Secondary | ICD-10-CM | POA: Diagnosis not present

## 2021-03-12 DIAGNOSIS — K222 Esophageal obstruction: Secondary | ICD-10-CM | POA: Diagnosis present

## 2021-03-12 DIAGNOSIS — I4891 Unspecified atrial fibrillation: Secondary | ICD-10-CM | POA: Diagnosis present

## 2021-03-12 DIAGNOSIS — Z8249 Family history of ischemic heart disease and other diseases of the circulatory system: Secondary | ICD-10-CM

## 2021-03-12 DIAGNOSIS — M1990 Unspecified osteoarthritis, unspecified site: Secondary | ICD-10-CM | POA: Diagnosis present

## 2021-03-12 DIAGNOSIS — T82868A Thrombosis of vascular prosthetic devices, implants and grafts, initial encounter: Secondary | ICD-10-CM | POA: Diagnosis not present

## 2021-03-12 DIAGNOSIS — T82858A Stenosis of vascular prosthetic devices, implants and grafts, initial encounter: Secondary | ICD-10-CM | POA: Diagnosis not present

## 2021-03-12 DIAGNOSIS — Z7902 Long term (current) use of antithrombotics/antiplatelets: Secondary | ICD-10-CM | POA: Diagnosis not present

## 2021-03-12 DIAGNOSIS — Z79899 Other long term (current) drug therapy: Secondary | ICD-10-CM | POA: Diagnosis not present

## 2021-03-12 DIAGNOSIS — F17210 Nicotine dependence, cigarettes, uncomplicated: Secondary | ICD-10-CM | POA: Diagnosis present

## 2021-03-12 DIAGNOSIS — I739 Peripheral vascular disease, unspecified: Secondary | ICD-10-CM

## 2021-03-12 DIAGNOSIS — K2970 Gastritis, unspecified, without bleeding: Secondary | ICD-10-CM | POA: Diagnosis not present

## 2021-03-12 DIAGNOSIS — Z95828 Presence of other vascular implants and grafts: Secondary | ICD-10-CM

## 2021-03-12 DIAGNOSIS — Y838 Other surgical procedures as the cause of abnormal reaction of the patient, or of later complication, without mention of misadventure at the time of the procedure: Secondary | ICD-10-CM | POA: Diagnosis not present

## 2021-03-12 DIAGNOSIS — Z20822 Contact with and (suspected) exposure to covid-19: Secondary | ICD-10-CM | POA: Diagnosis present

## 2021-03-12 DIAGNOSIS — D62 Acute posthemorrhagic anemia: Secondary | ICD-10-CM | POA: Diagnosis not present

## 2021-03-12 DIAGNOSIS — T82898A Other specified complication of vascular prosthetic devices, implants and grafts, initial encounter: Secondary | ICD-10-CM | POA: Diagnosis not present

## 2021-03-12 DIAGNOSIS — Y828 Other medical devices associated with adverse incidents: Secondary | ICD-10-CM | POA: Diagnosis not present

## 2021-03-12 DIAGNOSIS — Z419 Encounter for procedure for purposes other than remedying health state, unspecified: Secondary | ICD-10-CM

## 2021-03-12 DIAGNOSIS — I743 Embolism and thrombosis of arteries of the lower extremities: Secondary | ICD-10-CM | POA: Diagnosis not present

## 2021-03-12 HISTORY — PX: INSERTION OF ILIAC STENT: SHX6256

## 2021-03-12 HISTORY — PX: LOWER EXTREMITY ANGIOGRAM: SHX5508

## 2021-03-12 HISTORY — PX: FEMORAL-POPLITEAL BYPASS GRAFT: SHX937

## 2021-03-12 LAB — POCT I-STAT 7, (LYTES, BLD GAS, ICA,H+H)
Acid-base deficit: 3 mmol/L — ABNORMAL HIGH (ref 0.0–2.0)
Acid-base deficit: 7 mmol/L — ABNORMAL HIGH (ref 0.0–2.0)
Acid-base deficit: 7 mmol/L — ABNORMAL HIGH (ref 0.0–2.0)
Bicarbonate: 22.1 mmol/L (ref 20.0–28.0)
Bicarbonate: 18.7 mmol/L — ABNORMAL LOW (ref 20.0–28.0)
Bicarbonate: 18.4 mmol/L — ABNORMAL LOW (ref 20.0–28.0)
Calcium, Ion: 1.1 mmol/L — ABNORMAL LOW (ref 1.15–1.40)
Calcium, Ion: 1.06 mmol/L — ABNORMAL LOW (ref 1.15–1.40)
Calcium, Ion: 1.1 mmol/L — ABNORMAL LOW (ref 1.15–1.40)
HCT: 27 % — ABNORMAL LOW (ref 39.0–52.0)
HCT: 15 % — ABNORMAL LOW (ref 39.0–52.0)
HCT: 29 % — ABNORMAL LOW (ref 39.0–52.0)
Hemoglobin: 9.2 g/dL — ABNORMAL LOW (ref 13.0–17.0)
Hemoglobin: 5.1 g/dL — CL (ref 13.0–17.0)
Hemoglobin: 9.9 g/dL — ABNORMAL LOW (ref 13.0–17.0)
O2 Saturation: 100 %
O2 Saturation: 100 %
O2 Saturation: 92 %
Patient temperature: 36.7
Potassium: 3.8 mmol/L (ref 3.5–5.1)
Potassium: 3.6 mmol/L (ref 3.5–5.1)
Potassium: 4 mmol/L (ref 3.5–5.1)
Sodium: 138 mmol/L (ref 135–145)
Sodium: 140 mmol/L (ref 135–145)
Sodium: 140 mmol/L (ref 135–145)
TCO2: 23 mmol/L (ref 22–32)
TCO2: 20 mmol/L — ABNORMAL LOW (ref 22–32)
TCO2: 19 mmol/L — ABNORMAL LOW (ref 22–32)
pCO2 arterial: 40 mmHg (ref 32.0–48.0)
pCO2 arterial: 40.4 mmHg (ref 32.0–48.0)
pCO2 arterial: 36.5 mmHg (ref 32.0–48.0)
pH, Arterial: 7.349 — ABNORMAL LOW (ref 7.350–7.450)
pH, Arterial: 7.273 — ABNORMAL LOW (ref 7.350–7.450)
pH, Arterial: 7.31 — ABNORMAL LOW (ref 7.350–7.450)
pO2, Arterial: 268 mmHg — ABNORMAL HIGH (ref 83.0–108.0)
pO2, Arterial: 243 mmHg — ABNORMAL HIGH (ref 83.0–108.0)
pO2, Arterial: 69 mmHg — ABNORMAL LOW (ref 83.0–108.0)

## 2021-03-12 LAB — PREPARE RBC (CROSSMATCH)

## 2021-03-12 LAB — POCT ACTIVATED CLOTTING TIME
Activated Clotting Time: 231 seconds
Activated Clotting Time: 208 seconds
Activated Clotting Time: 208 seconds

## 2021-03-12 LAB — GLUCOSE, CAPILLARY: Glucose-Capillary: 132 mg/dL — ABNORMAL HIGH (ref 70–99)

## 2021-03-12 SURGERY — BYPASS GRAFT FEMORAL-POPLITEAL ARTERY
Anesthesia: General | Site: Leg Upper | Laterality: Right

## 2021-03-12 MED ORDER — ONDANSETRON HCL 4 MG/2ML IJ SOLN
INTRAMUSCULAR | Status: DC | PRN
  Administered 2021-03-12: 4 mg via INTRAVENOUS

## 2021-03-12 MED ORDER — HEPARIN 6000 UNIT IRRIGATION SOLUTION
Status: DC | PRN
  Administered 2021-03-12 (×3): 1

## 2021-03-12 MED ORDER — CEFAZOLIN SODIUM-DEXTROSE 2-4 GM/100ML-% IV SOLN
2.0000 g | INTRAVENOUS | Status: AC
  Administered 2021-03-12 (×2): 2 g via INTRAVENOUS
  Filled 2021-03-12: qty 100

## 2021-03-12 MED ORDER — PHENOL 1.4 % MT LIQD: 1.0000 | OROMUCOSAL | Status: DC | PRN

## 2021-03-12 MED ORDER — SODIUM CHLORIDE 0.9 % IV SOLN: INTRAVENOUS | Status: DC

## 2021-03-12 MED ORDER — OXYCODONE HCL 5 MG/5ML PO SOLN: 5.0000 mg | Freq: Once | ORAL | Status: DC | PRN

## 2021-03-12 MED ORDER — OXYCODONE-ACETAMINOPHEN 5-325 MG PO TABS
1.0000 | ORAL_TABLET | ORAL | Status: DC | PRN
  Administered 2021-03-12 – 2021-03-16 (×7): 2 via ORAL
  Filled 2021-03-12 (×8): qty 2

## 2021-03-12 MED ORDER — METOPROLOL TARTRATE 50 MG PO TABS
50.0000 mg | ORAL_TABLET | Freq: Three times a day (TID) | ORAL | Status: DC
  Administered 2021-03-12 – 2021-03-15 (×8): 50 mg via ORAL
  Filled 2021-03-12 (×8): qty 1

## 2021-03-12 MED ORDER — DEXAMETHASONE SODIUM PHOSPHATE 10 MG/ML IJ SOLN
INTRAMUSCULAR | Status: AC
  Filled 2021-03-12: qty 1

## 2021-03-12 MED ORDER — PANTOPRAZOLE SODIUM 40 MG PO TBEC
40.0000 mg | DELAYED_RELEASE_TABLET | Freq: Every day | ORAL | Status: DC
  Administered 2021-03-13 – 2021-03-16 (×4): 40 mg via ORAL
  Filled 2021-03-12 (×4): qty 1

## 2021-03-12 MED ORDER — ASPIRIN EC 81 MG PO TBEC
81.0000 mg | DELAYED_RELEASE_TABLET | Freq: Every day | ORAL | Status: DC
  Administered 2021-03-13 – 2021-03-16 (×3): 81 mg via ORAL
  Filled 2021-03-12 (×4): qty 1

## 2021-03-12 MED ORDER — CALCIUM CHLORIDE 10 % IV SOLN
INTRAVENOUS | Status: DC | PRN
  Administered 2021-03-12 (×5): 200 mg via INTRAVENOUS

## 2021-03-12 MED ORDER — FENTANYL CITRATE (PF) 250 MCG/5ML IJ SOLN
INTRAMUSCULAR | Status: AC
  Filled 2021-03-12: qty 5

## 2021-03-12 MED ORDER — SODIUM CHLORIDE 0.9 % IV SOLN
500.0000 mL | Freq: Once | INTRAVENOUS | Status: AC | PRN
  Administered 2021-03-16: 500 mL via INTRAVENOUS

## 2021-03-12 MED ORDER — ORAL CARE MOUTH RINSE: 15.0000 mL | Freq: Once | OROMUCOSAL | Status: AC

## 2021-03-12 MED ORDER — ROCURONIUM BROMIDE 10 MG/ML (PF) SYRINGE
PREFILLED_SYRINGE | INTRAVENOUS | Status: AC
  Filled 2021-03-12: qty 10

## 2021-03-12 MED ORDER — ROSUVASTATIN CALCIUM 5 MG PO TABS
10.0000 mg | ORAL_TABLET | Freq: Every day | ORAL | Status: DC
  Administered 2021-03-12 – 2021-03-16 (×5): 10 mg via ORAL
  Filled 2021-03-12 (×5): qty 2

## 2021-03-12 MED ORDER — MORPHINE SULFATE (PF) 2 MG/ML IV SOLN
2.0000 mg | INTRAVENOUS | Status: DC | PRN
  Administered 2021-03-12 – 2021-03-13 (×2): 2 mg via INTRAVENOUS
  Filled 2021-03-12 (×2): qty 1

## 2021-03-12 MED ORDER — FENTANYL CITRATE (PF) 100 MCG/2ML IJ SOLN
INTRAMUSCULAR | Status: DC | PRN
  Administered 2021-03-12 (×5): 50 ug via INTRAVENOUS
  Administered 2021-03-12: 100 ug via INTRAVENOUS

## 2021-03-12 MED ORDER — HYDROCHLOROTHIAZIDE 25 MG PO TABS
25.0000 mg | ORAL_TABLET | Freq: Three times a day (TID) | ORAL | Status: DC
  Administered 2021-03-12 – 2021-03-15 (×8): 25 mg via ORAL
  Filled 2021-03-12 (×8): qty 1

## 2021-03-12 MED ORDER — LABETALOL HCL 5 MG/ML IV SOLN: 10.0000 mg | INTRAVENOUS | Status: DC | PRN

## 2021-03-12 MED ORDER — ALBUMIN HUMAN 5 % IV SOLN: INTRAVENOUS | Status: DC | PRN

## 2021-03-12 MED ORDER — CHLORHEXIDINE GLUCONATE CLOTH 2 % EX PADS: 6.0000 | MEDICATED_PAD | Freq: Once | CUTANEOUS | Status: DC

## 2021-03-12 MED ORDER — PROPOFOL 10 MG/ML IV BOLUS
INTRAVENOUS | Status: DC | PRN
  Administered 2021-03-12: 100 mg via INTRAVENOUS

## 2021-03-12 MED ORDER — LIDOCAINE 2% (20 MG/ML) 5 ML SYRINGE
INTRAMUSCULAR | Status: AC
  Filled 2021-03-12: qty 5

## 2021-03-12 MED ORDER — HEMOSTATIC AGENTS (NO CHARGE) OPTIME
TOPICAL | Status: DC | PRN
  Administered 2021-03-12: 1 via TOPICAL

## 2021-03-12 MED ORDER — IODIXANOL 320 MG/ML IV SOLN
INTRAVENOUS | Status: DC | PRN
  Administered 2021-03-12 (×2): 50 mL

## 2021-03-12 MED ORDER — LACTATED RINGERS IV SOLN
INTRAVENOUS | Status: DC | PRN
Start: 2021-03-12 — End: 2021-03-12

## 2021-03-12 MED ORDER — POLYETHYLENE GLYCOL 3350 17 G PO PACK: 17.0000 g | PACK | Freq: Every day | ORAL | Status: DC | PRN

## 2021-03-12 MED ORDER — OXYCODONE HCL 5 MG PO TABS: 5.0000 mg | ORAL_TABLET | Freq: Once | ORAL | Status: DC | PRN

## 2021-03-12 MED ORDER — DOCUSATE SODIUM 100 MG PO CAPS
100.0000 mg | ORAL_CAPSULE | Freq: Every day | ORAL | Status: DC
  Filled 2021-03-12 (×4): qty 1

## 2021-03-12 MED ORDER — DEXAMETHASONE SODIUM PHOSPHATE 10 MG/ML IJ SOLN
INTRAMUSCULAR | Status: DC | PRN
  Administered 2021-03-12: 5 mg via INTRAVENOUS

## 2021-03-12 MED ORDER — BISACODYL 5 MG PO TBEC: 5.0000 mg | DELAYED_RELEASE_TABLET | Freq: Every day | ORAL | Status: DC | PRN

## 2021-03-12 MED ORDER — PROTAMINE SULFATE 10 MG/ML IV SOLN
INTRAVENOUS | Status: DC | PRN
  Administered 2021-03-12: 50 mg via INTRAVENOUS

## 2021-03-12 MED ORDER — ROCURONIUM BROMIDE 10 MG/ML (PF) SYRINGE
PREFILLED_SYRINGE | INTRAVENOUS | Status: DC | PRN
  Administered 2021-03-12: 70 mg via INTRAVENOUS
  Administered 2021-03-12: 20 mg via INTRAVENOUS
  Administered 2021-03-12: 10 mg via INTRAVENOUS
  Administered 2021-03-12: 20 mg via INTRAVENOUS
  Administered 2021-03-12: 30 mg via INTRAVENOUS

## 2021-03-12 MED ORDER — HEPARIN 6000 UNIT IRRIGATION SOLUTION
Status: AC
  Filled 2021-03-12: qty 500

## 2021-03-12 MED ORDER — POTASSIUM CHLORIDE CRYS ER 20 MEQ PO TBCR: 20.0000 meq | EXTENDED_RELEASE_TABLET | Freq: Every day | ORAL | Status: DC | PRN

## 2021-03-12 MED ORDER — METOPROLOL-HYDROCHLOROTHIAZIDE 50-25 MG PO TABS: 1.0000 | ORAL_TABLET | Freq: Three times a day (TID) | ORAL | Status: DC

## 2021-03-12 MED ORDER — PHENYLEPHRINE 40 MCG/ML (10ML) SYRINGE FOR IV PUSH (FOR BLOOD PRESSURE SUPPORT)
PREFILLED_SYRINGE | INTRAVENOUS | Status: AC
  Filled 2021-03-12: qty 10

## 2021-03-12 MED ORDER — CHLORHEXIDINE GLUCONATE 0.12 % MT SOLN
OROMUCOSAL | Status: AC
  Administered 2021-03-12: 15 mL via OROMUCOSAL
  Filled 2021-03-12: qty 15

## 2021-03-12 MED ORDER — GUAIFENESIN-DM 100-10 MG/5ML PO SYRP: 15.0000 mL | ORAL_SOLUTION | ORAL | Status: DC | PRN

## 2021-03-12 MED ORDER — 0.9 % SODIUM CHLORIDE (POUR BTL) OPTIME
TOPICAL | Status: DC | PRN
  Administered 2021-03-12: 1000 mL

## 2021-03-12 MED ORDER — HEPARIN 6000 UNIT IRRIGATION SOLUTION: Status: DC | PRN

## 2021-03-12 MED ORDER — SODIUM CHLORIDE 0.9% IV SOLUTION: Freq: Once | INTRAVENOUS | Status: DC

## 2021-03-12 MED ORDER — CEFAZOLIN SODIUM-DEXTROSE 2-4 GM/100ML-% IV SOLN
2.0000 g | Freq: Three times a day (TID) | INTRAVENOUS | Status: AC
Start: 2021-03-13 — End: 2021-03-13
  Administered 2021-03-13 (×2): 2 g via INTRAVENOUS
  Filled 2021-03-12 (×2): qty 100

## 2021-03-12 MED ORDER — CHLORHEXIDINE GLUCONATE 0.12 % MT SOLN: 15.0000 mL | Freq: Once | OROMUCOSAL | Status: AC

## 2021-03-12 MED ORDER — ONDANSETRON HCL 4 MG/2ML IJ SOLN: 4.0000 mg | Freq: Four times a day (QID) | INTRAMUSCULAR | Status: DC | PRN

## 2021-03-12 MED ORDER — PHENYLEPHRINE 40 MCG/ML (10ML) SYRINGE FOR IV PUSH (FOR BLOOD PRESSURE SUPPORT)
PREFILLED_SYRINGE | INTRAVENOUS | Status: DC | PRN
  Administered 2021-03-12: 160 ug via INTRAVENOUS
  Administered 2021-03-12: 120 ug via INTRAVENOUS

## 2021-03-12 MED ORDER — LIDOCAINE 2% (20 MG/ML) 5 ML SYRINGE
INTRAMUSCULAR | Status: DC | PRN
Start: 2021-03-12 — End: 2021-03-12
  Administered 2021-03-12: 40 mg via INTRAVENOUS

## 2021-03-12 MED ORDER — ACETAMINOPHEN 325 MG PO TABS: 325.0000 mg | ORAL_TABLET | ORAL | Status: DC | PRN

## 2021-03-12 MED ORDER — MAGNESIUM SULFATE 2 GM/50ML IV SOLN: 2.0000 g | Freq: Every day | INTRAVENOUS | Status: DC | PRN

## 2021-03-12 MED ORDER — ACETAMINOPHEN 650 MG RE SUPP: 325.0000 mg | RECTAL | Status: DC | PRN

## 2021-03-12 MED ORDER — FENTANYL CITRATE (PF) 100 MCG/2ML IJ SOLN: 25.0000 ug | INTRAMUSCULAR | Status: DC | PRN

## 2021-03-12 MED ORDER — ALUM & MAG HYDROXIDE-SIMETH 200-200-20 MG/5ML PO SUSP: 15.0000 mL | ORAL | Status: DC | PRN

## 2021-03-12 MED ORDER — HYDRALAZINE HCL 20 MG/ML IJ SOLN
5.0000 mg | INTRAMUSCULAR | Status: DC | PRN
Start: 2021-03-12 — End: 2021-03-16

## 2021-03-12 MED ORDER — METOPROLOL TARTRATE 5 MG/5ML IV SOLN: 2.0000 mg | INTRAVENOUS | Status: DC | PRN

## 2021-03-12 MED ORDER — AMLODIPINE BESYLATE 5 MG PO TABS
5.0000 mg | ORAL_TABLET | Freq: Every day | ORAL | Status: DC
  Administered 2021-03-13 – 2021-03-15 (×3): 5 mg via ORAL
  Filled 2021-03-12 (×4): qty 1

## 2021-03-12 MED ORDER — HEPARIN SODIUM (PORCINE) 1000 UNIT/ML IJ SOLN
INTRAMUSCULAR | Status: AC
  Filled 2021-03-12: qty 1

## 2021-03-12 MED ORDER — HEMOSTATIC AGENTS (NO CHARGE) OPTIME
TOPICAL | Status: DC | PRN
  Administered 2021-03-12: 2 via TOPICAL

## 2021-03-12 MED ORDER — PHENYLEPHRINE HCL-NACL 20-0.9 MG/250ML-% IV SOLN
INTRAVENOUS | Status: DC | PRN
  Administered 2021-03-12: 80 ug/min via INTRAVENOUS
  Administered 2021-03-12: 25 ug/min via INTRAVENOUS

## 2021-03-12 MED ORDER — ONDANSETRON HCL 4 MG/2ML IJ SOLN
4.0000 mg | Freq: Four times a day (QID) | INTRAMUSCULAR | Status: DC | PRN
Start: 2021-03-12 — End: 2021-03-12

## 2021-03-12 MED ORDER — PROPOFOL 10 MG/ML IV BOLUS
INTRAVENOUS | Status: AC
  Filled 2021-03-12: qty 20

## 2021-03-12 MED ORDER — ONDANSETRON HCL 4 MG/2ML IJ SOLN
INTRAMUSCULAR | Status: AC
  Filled 2021-03-12: qty 2

## 2021-03-12 MED ORDER — CLOPIDOGREL BISULFATE 75 MG PO TABS
75.0000 mg | ORAL_TABLET | Freq: Every day | ORAL | Status: DC
  Administered 2021-03-12 – 2021-03-16 (×4): 75 mg via ORAL
  Filled 2021-03-12 (×4): qty 1

## 2021-03-12 MED ORDER — CHLORHEXIDINE GLUCONATE CLOTH 2 % EX PADS
6.0000 | MEDICATED_PAD | Freq: Every day | CUTANEOUS | Status: DC
  Administered 2021-03-13 – 2021-03-14 (×2): 6 via TOPICAL

## 2021-03-12 MED ORDER — LACTATED RINGERS IV SOLN: INTRAVENOUS | Status: DC

## 2021-03-12 MED ORDER — HEPARIN SODIUM (PORCINE) 1000 UNIT/ML IJ SOLN
INTRAMUSCULAR | Status: DC | PRN
  Administered 2021-03-12: 1000 [IU] via INTRAVENOUS
  Administered 2021-03-12: 2000 [IU] via INTRAVENOUS
  Administered 2021-03-12: 6000 [IU] via INTRAVENOUS
  Administered 2021-03-12: 2000 [IU] via INTRAVENOUS
  Administered 2021-03-12: 1000 [IU] via INTRAVENOUS

## 2021-03-12 SURGICAL SUPPLY — 87 items
BAG COUNTER SPONGE SURGICOUNT (BAG) ×3 IMPLANT
BALLN MUSTANG 7X80X75 (BALLOONS) ×3
BALLOON MUSTANG 7X80X75 (BALLOONS) ×2 IMPLANT
BANDAGE ESMARK 6X9 LF (GAUZE/BANDAGES/DRESSINGS) ×2 IMPLANT
BNDG ELASTIC 4X5.8 VLCR STR LF (GAUZE/BANDAGES/DRESSINGS) ×6 IMPLANT
BNDG ELASTIC 6X5.8 VLCR STR LF (GAUZE/BANDAGES/DRESSINGS) ×6 IMPLANT
BNDG ESMARK 6X9 LF (GAUZE/BANDAGES/DRESSINGS) ×3
BNDG GAUZE ELAST 4 BULKY (GAUZE/BANDAGES/DRESSINGS) ×3 IMPLANT
CANISTER SUCT 3000ML PPV (MISCELLANEOUS) ×3 IMPLANT
CANNULA VESSEL 3MM 2 BLNT TIP (CANNULA) ×3 IMPLANT
CATH BEACON 5 .035 40 KMP TP (CATHETERS) ×2 IMPLANT
CATH BEACON 5 .038 40 KMP TP (CATHETERS) ×3
CATH CROSS OVER TEMPO 5F (CATHETERS) ×3 IMPLANT
CATH EMB 4FR 80CM (CATHETERS) ×6 IMPLANT
CLIP VESOCCLUDE MED 24/CT (CLIP) ×3 IMPLANT
CLIP VESOCCLUDE SM WIDE 24/CT (CLIP) ×3 IMPLANT
COVER PROBE W GEL 5X96 (DRAPES) ×3 IMPLANT
COVER SURGICAL LIGHT HANDLE (MISCELLANEOUS) ×6 IMPLANT
CUFF TOURN SGL QUICK 24 (TOURNIQUET CUFF) ×3
CUFF TRNQT CYL 24X4X40X1 (TOURNIQUET CUFF) ×2 IMPLANT
DERMABOND ADVANCED (GAUZE/BANDAGES/DRESSINGS) ×5
DERMABOND ADVANCED .7 DNX12 (GAUZE/BANDAGES/DRESSINGS) ×10 IMPLANT
DEVICE TORQUE H2O (MISCELLANEOUS) ×3 IMPLANT
DRAPE C-ARM 42X72 X-RAY (DRAPES) ×3 IMPLANT
DRAPE HALF SHEET 40X57 (DRAPES) ×6 IMPLANT
DRAPE INCISE IOBAN 66X45 STRL (DRAPES) ×3 IMPLANT
DURAPREP 26ML APPLICATOR (WOUND CARE) ×3 IMPLANT
ELECT REM PT RETURN 9FT ADLT (ELECTROSURGICAL) ×3
ELECTRODE REM PT RTRN 9FT ADLT (ELECTROSURGICAL) ×2 IMPLANT
GAUZE 4X4 16PLY ~~LOC~~+RFID DBL (SPONGE) ×6 IMPLANT
GLOVE SRG 8 PF TXTR STRL LF DI (GLOVE) ×4 IMPLANT
GLOVE SURG MICRO LTX SZ6.5 (GLOVE) ×3 IMPLANT
GLOVE SURG POLYISO LF SZ6.5 (GLOVE) ×3 IMPLANT
GLOVE SURG POLYISO LF SZ7.5 (GLOVE) ×6 IMPLANT
GLOVE SURG UNDER POLY LF SZ8 (GLOVE) ×6
GOWN STRL REUS W/ TWL LRG LVL3 (GOWN DISPOSABLE) ×4 IMPLANT
GOWN STRL REUS W/ TWL XL LVL3 (GOWN DISPOSABLE) ×2 IMPLANT
GOWN STRL REUS W/TWL LRG LVL3 (GOWN DISPOSABLE) ×6
GOWN STRL REUS W/TWL XL LVL3 (GOWN DISPOSABLE) ×3
GRAFT PROPATEN W/RING 6X80X60 (Vascular Products) ×3 IMPLANT
GUIDEWIRE ANGLED .035X150CM (WIRE) ×3 IMPLANT
GUIDEWIRE ANGLED .035X260CM (WIRE) ×3 IMPLANT
GUIDEWIRE BENTSON (WIRE) ×3 IMPLANT
INSERT FOGARTY SM (MISCELLANEOUS) ×3 IMPLANT
KIT BASIN OR (CUSTOM PROCEDURE TRAY) ×3 IMPLANT
KIT MICROPUNCTURE NIT STIFF (SHEATH) ×3 IMPLANT
KIT TURNOVER KIT B (KITS) ×3 IMPLANT
NS IRRIG 1000ML POUR BTL (IV SOLUTION) ×6 IMPLANT
PACK PERIPHERAL VASCULAR (CUSTOM PROCEDURE TRAY) ×3 IMPLANT
PAD ARMBOARD 7.5X6 YLW CONV (MISCELLANEOUS) ×6 IMPLANT
PAD CAST 4YDX4 CTTN HI CHSV (CAST SUPPLIES) ×2 IMPLANT
PADDING CAST COTTON 4X4 STRL (CAST SUPPLIES) ×3
PENCIL BUTTON HOLSTER BLD 10FT (ELECTRODE) ×3 IMPLANT
SET MICROPUNCTURE 5F STIFF (MISCELLANEOUS) ×3 IMPLANT
SHEATH BRITE TIP 7FRX11 (SHEATH) ×3 IMPLANT
SHEATH FLEXOR INTRODUCER 8FR (SHEATH) ×3 IMPLANT
SHEATH PINNACLE 5F 10CM (SHEATH) ×3 IMPLANT
SPONGE T-LAP 18X18 ~~LOC~~+RFID (SPONGE) ×24 IMPLANT
STENT INNOVA 7X60X130 (Permanent Stent) IMPLANT
STENT INNOVA 7X80X130 (Permanent Stent) ×3 IMPLANT
STOPCOCK 4 WAY LG BORE MALE ST (IV SETS) ×6 IMPLANT
SURGIFLO W/THROMBIN 8M KIT (HEMOSTASIS) ×6 IMPLANT
SUT PROLENE 5 0 C 1 24 (SUTURE) ×24 IMPLANT
SUT PROLENE 6 0 BV (SUTURE) ×18 IMPLANT
SUT PROLENE 7 0 BV 1 (SUTURE) ×3 IMPLANT
SUT PROLENE 7 0 BV1 MDA (SUTURE) ×3 IMPLANT
SUT SILK 2 0 (SUTURE) ×3
SUT SILK 2 0 SH (SUTURE) ×9 IMPLANT
SUT SILK 2-0 18XBRD TIE 12 (SUTURE) ×2 IMPLANT
SUT SILK 3 0 (SUTURE) ×3
SUT SILK 3-0 18XBRD TIE 12 (SUTURE) ×2 IMPLANT
SUT VIC AB 2-0 CT1 27 (SUTURE) ×9
SUT VIC AB 2-0 CT1 TAPERPNT 27 (SUTURE) ×6 IMPLANT
SUT VIC AB 3-0 SH 27 (SUTURE) ×12
SUT VIC AB 3-0 SH 27X BRD (SUTURE) ×8 IMPLANT
SUT VIC AB 3-0 X1 27 (SUTURE) ×6 IMPLANT
SUT VICRYL 4-0 PS2 18IN ABS (SUTURE) ×6 IMPLANT
SYR 10ML LL (SYRINGE) ×6 IMPLANT
SYR 3ML LL SCALE MARK (SYRINGE) ×3 IMPLANT
TOWEL GREEN STERILE (TOWEL DISPOSABLE) ×3 IMPLANT
TRAY FOLEY MTR SLVR 16FR STAT (SET/KITS/TRAYS/PACK) ×3 IMPLANT
TUBING CIL FLEX 10 FLL-RA (TUBING) ×3 IMPLANT
TUBING EXTENTION W/L.L. (IV SETS) ×3 IMPLANT
UNDERPAD 30X36 HEAVY ABSORB (UNDERPADS AND DIAPERS) ×3 IMPLANT
WATER STERILE IRR 1000ML POUR (IV SOLUTION) ×3 IMPLANT
WIRE BENTSON .035X145CM (WIRE) ×3 IMPLANT
WIRE EMERALD 3MM-J .035X260CM (WIRE) ×3 IMPLANT

## 2021-03-12 NOTE — Op Note (Signed)
Patient name: Todd [name] MRN: [id number] DOB: 10/04/1950 Sex: male  03-12-2021 Pre-operative Diagnosis: Right leg rest pain Post-operative diagnosis:  Same Surgeon:  Annamarie Major Assistants:  Melene Muller, MD, Risa Grill, PA Procedure:   #1: Right iliofemoral endarterectomy with vein patch angioplasty   #2: Right femoral to above-knee popliteal artery bypass graft with 6 mm external ring propatent PTFE   #3: Harvest of right great saphenous vein   #4: Ultrasound-guided access, left femoral artery   #5: Abdominal aortogram   #6: Right lower extremity runoff   #7: Right common and external iliac artery stent (7 x 80 Elluvia) Anesthesia:  General Blood Loss:  See anesthesia record Specimens:  none  Findings: There was extensive plaque in the common femoral artery which went up into the inguinal ligament to the previous external iliac stent.  When removing the plaque from the external iliac artery, the previously placed stent was part of the removed specimen and the stent was partially removed.  I felt that this compromised the inflow and so from the contralateral side, with through and through access, a 7 x 80 stent was placed which goes down to the proximal portion of the common femoral artery patch and then through the existing stent up into the distal common iliac artery.  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  The patient had a dual saphenous system.  Of the 2 paired saphenous veins, 1 was clearly larger than the other.  This was used for the bypass, however upon completion of the bypass there was a very poor signal.  Based on the way the vein had dilated I suspected this was due to a problem with the vein and so I ultimately removed this and did a bypass from the common femoral vein patch to the above-knee popliteal artery.  The patient had a graft dependent posterior tibial Doppler signal at the end  Indications: This is a 70 year old gentleman with history of bilateral iliac  stents.  He has a ulcer on his left great toe and rest pain in the right leg.  He comes in today for surgical revascularization.  Procedure:  The patient was identified in the holding area and taken to * No surgery found *  The patient was then placed supine on the table. general anesthesia was administered.  The patient was prepped and draped in the usual sterile fashion.  A time out was called and antibiotics were administered.  An assistant was necessary to expedite the procedure and assist with technical details.  I first began by making a longitudinal incision in the right groin.  Cautery was used divide subcutaneous tissue down to the femoral sheath which was opened sharply.  I exposed the distal external iliac artery under the inguinal ligament as well as the profundofemoral artery.  These were encircled with Vesseloops as were side branches.  I then identified the saphenous vein and the saphenofemoral junction.  Just beyond the junction there were 2 large branches.  I proceeded to harvest the saphenous vein down the leg through skip incisions.  Both branches of the vein were similar in caliber to the mid thigh.  At this level the branch that became superficial was the best vein and so it was fully mobilized.  Through the distal medial incision, I exposed the above-knee popliteal artery which was a soft artery without significant plaque.  I then ligated the saphenous vein with silk tie distally and proximally at the saphenofemoral junction.  I then created a  subsartorial tunnel between the distal incision in the groin incision.  The patient was fully heparinized.  Heparin levels were redosed appropriately throughout the procedure.  I then prepared the vein on the back table.  This distended to about 3-1/2 mm.  The distal vein however was marginal.  Next, I occluded the common femoral and profundofemoral arteries.  A #11 blade was used to make an arteriotomy which was extended longitudinally with Potts  scissors.  I did clear out the proximal superficial femoral artery which was chronically occluded.  I then proceeded with endarterectomy.  I inserted a Fogarty balloon into the external iliac artery so that I could fully endarterectomized the artery up to the stent.  When completing the proximal portion of the endarterectomy, a portion of the previous placed external iliac stent unraveled.  A portion of the stent was cut.  I then deflated the Fogarty balloon and there appeared to be adequate inflow.  I then set up for the bypass.  The vein was placed in 9 reversed fashion and spatulated to fit the size of the arteriotomy.  Running anastomosis was then created with 5-0 Prolene.  After this was completed the clamps were released.  Because the artery was very thin proximally, there was some bleeding around the patch which was repaired with vein pledgets.  I was not very happy with the pulse in the groin at this time and felt that this needed to be better evaluated with angiography.  Ultrasound was used to evaluate the left common femoral artery which was calcified.  It was cannulated under ultrasound guidance with a micropuncture needle.  An 018 wire was advanced without resistance and a micropuncture sheath was placed.  I then inserted a Bentson wire into the aorta and placed a 5 French sheath.  I then used a crossover catheter to get wire access into the right iliac artery.  I then navigated a Glidewire into the common femoral artery and performed angiography.  I felt that there was a problem with the previously placed stent that was partially removed.  I felt that this area needed to be restented.  In order to do this, I removed the anastomosis and the common femoral artery and brought the Glidewire out.  I then passed a 7 French sheath up into the external iliac artery.  I selected a 7 x 80 Elluvia stent which was deployed in the distal common iliac artery through the existing stent and into the distal external  iliac artery.  It was postdilated with a 7 mm balloon.  At this point there was a significantly improved pulse and the right groin.  In order to do this I had to remove the proximal anastomosis.  I further opened the common femoral artery through the very thin area anteriorly.  I elected to place a vein patch.  I used the vein that was harvested but not utilized for the vein patch.  This was sewn in with a 6-0 Prolene.  After completion, there was an excellent pulse in the common femoral artery.  The common femoral artery was then reoccluded.  A arteriotomy was made in the vein patch.  The vein graft was then spatulated to fit the arteriotomy in a running anastomosis was created with 6-0 Prolene.  Once this was completed the clamps were released.  I then used a valvulotome to lyse the valves within the vein.  I was not very happy with the flow through the vein graft.  It look like a  distal problem.  I was able to pass a 4 dilator through the vein without resistance and did get to a point where I was satisfied with the flow through the graft.  I then brought the vein graft through the previously created tunnel.  An Esmarch was used to exsanguinate the leg and a tourniquet was inflated to 250 mm of pressure in the mid thigh.  I then opened the above-knee popliteal artery with an 11 blade and extended longitudinally with Potts scissors.  The artery was healthy at this level.  The vein graft was cut the appropriate length and spatulated to fit the size the arteriotomy.  A running anastomosis was created with 6-0 Prolene.  Prior to completion the tourniquet was let down and the appropriate flushing maneuvers were performed.  Once the anastomosis was completed, I evaluated signals in the foot and in the graft I was not happy.  I therefore elected to shoot an arteriogram with a catheter from the left side and the right external iliac artery.  This showed that the vein graft had already occluded.  Because I felt that the  vein graft conduit was the problem I elected to use Gore-Tex.  I took down the proximal anastomosis after reoccluding the common femoral artery.  I selected a 6 mm external ring propatent PTFE graft and spatulated this to fit the size the arteriotomy.  A running anastomosis was created with 6-0 Prolene.  Once this was completed, I again was not happy with blood flow through the Gore-Tex graft.  I once again came from the left side and cross the aortic bifurcation and was able to advance a Glidewire into the Gore-Tex graft and out the distal end.  I passed a 6 x 80 sheath up to the stent in the external iliac artery but could not get this to pass further.  I was concerned that the wire potentially could have gone behind the stent.  I elected to stop at this point and complete the bypass.  Now when I evaluated the blood flow through the Gore-Tex graft there was very good blood flow.  I suspected that this was a blood pressure related issue.  There was now brisk flow through the Gore-Tex graft.  I then created a new tunnel with a Gore tunneler and brought the Gore-Tex graft down to the distal incision.  I again exsanguinated the leg and inflated the tourniquet in the mid thigh.  The distal anastomosis from the vein graft was taken down.  The Gore-Tex graft was cut to the appropriate length and spatulated to fit the size of the arteriotomy.  A running anastomosis was created with 6-0 Prolene.  The tourniquet was let down and the appropriate flushing maneuvers were performed and the anastomosis was completed.  Hand-held Doppler revealed brisk Doppler signals in the popliteal artery below the anastomosis.  There was a graft dependent posterior tibial Doppler signal.  I elected to stop at this time as the patient had experienced a fair amount of blood loss and I felt that we had good signals at the ankle.  The heparin was reversed with protamine.  The wounds were then irrigated.  The vein harvest incisions were closed with  2 layers with 3-0 Vicryl.  I then closed the distal incision by reapproximating the fascia with 2-0 Vicryl in the subcutaneous tissue with additional layers of Vicryl followed by subcuticular closure.  The groin was then irrigated.  Hemostasis was achieved.  The femoral sheath was reapproximated with  2-0 Vicryl.  Subcutaneous tissue was then closed with multiple layers of Vicryl followed by subcuticular closure.  Dermabond was applied on all incisions.  I then wrapped the leg with Kerlix and Ace wrap.  The patient was successfully extubated and taken recovery in stable condition.   Disposition: To PACU stable.   Theotis Burrow, M.D., [facility] [place] Office: [phone] Pager:  [phone]

## 2021-03-12 NOTE — Progress Notes (Signed)
HR brady on arrival to preop. Per pt, last dose of metoprolol-hydrochlorothiazide (LOPRESSOR HCT) was yesterday @ 0800. Pt denies dizziness, lightheadedness, SHOB, CP. No acute distress noted. HR rechecked was 65. Dr. Marcie Bal notified,  who advised to hold metoprolol today.   03/12/21 0858  Vitals  Temp 97.6 F (36.4 C)  Temp Source Oral  Pulse Rate (!) 53  Pulse Rate Source Monitor;Left  Resp 17  Respiratory Pattern Regular  BP 126/68  SpO2 95 %  Oxygen Therapy  O2 Device Room Air

## 2021-03-12 NOTE — Interval H&P Note (Signed)
History and Physical Interval Note:  03/12/2021 10:00 AM  Todd [name]  has presented today for surgery, with the diagnosis of PAD.  The various methods of treatment have been discussed with the patient and family. After consideration of risks, benefits and other options for treatment, the patient has consented to  Procedure(s): RIGHT FEMORAL TO POPLITEAL ARTERY BYPASS GRAFTING (Right) as a surgical intervention.  The patient's history has been reviewed, patient examined, no change in status, stable for surgery.  I have reviewed the patient's chart and labs.  Questions were answered to the patient's satisfaction.     Annamarie Major

## 2021-03-12 NOTE — Progress Notes (Signed)
Upon arrival to PACU Bair hugger placed on lower extremities.  Patient with cap refill < 3 sec and dopplerable PT pulse in RLE.  I-stat done with H/H of 9.9/29.  Will continue to monitor.

## 2021-03-12 NOTE — Progress Notes (Signed)
Attempted to call patient's wife, no answer.

## 2021-03-12 NOTE — Anesthesia Postprocedure Evaluation (Signed)
Anesthesia Post Note  Patient: Todd [name]  Procedure(s) Performed: RIGHT FEMORAL TO POPLITEAL ARTERY BYPASS GRAFTING USING THE PROPATEN GRAFT (Right: Leg Upper) INSERTION OF  EXTERNAL ILIAC STENT (Right: Groin) LOWER EXTREMITY ANGIOGRAM (Right: Groin)     Patient location during evaluation: PACU Anesthesia Type: General Level of consciousness: awake and alert Pain management: pain level controlled Vital Signs Assessment: post-procedure vital signs reviewed and stable Respiratory status: spontaneous breathing, nonlabored ventilation, respiratory function stable and patient connected to nasal cannula oxygen Cardiovascular status: blood pressure returned to baseline and stable Postop Assessment: no apparent nausea or vomiting Anesthetic complications: no   No notable events documented.  Last Vitals:  Vitals:   03/12/21 2013 03/12/21 2029  BP: (!) 141/86 (!) 147/68  Pulse: 96 91  Resp: 17 16  Temp: 36.6 C 36.6 C  SpO2: 97% 100%    Last Pain:  Vitals:   03/12/21 2029  TempSrc: Oral  PainSc:                  Catalina Gravel

## 2021-03-12 NOTE — Transfer of Care (Signed)
Immediate Anesthesia Transfer of Care Note  Patient: Todd [name]  Procedure(s) Performed: RIGHT FEMORAL TO POPLITEAL ARTERY BYPASS GRAFTING USING THE PROPATEN GRAFT (Right: Leg Upper) INSERTION OF  EXTERNAL ILIAC STENT (Right: Groin) LOWER EXTREMITY ANGIOGRAM (Right: Groin)  Patient Location: PACU  Anesthesia Type:General  Level of Consciousness: drowsy, patient cooperative and responds to stimulation  Airway & Oxygen Therapy: Patient Spontanous Breathing  Post-op Assessment: Report given to RN and Post -op Vital signs reviewed and stable  Post vital signs: Reviewed and stable  Last Vitals:  Vitals Value Taken Time  BP 114/77 03/12/21 1908  Temp    Pulse 101 03/12/21 1912  Resp 20 03/12/21 1913  SpO2 97 % 03/12/21 1912  Vitals shown include unvalidated device data.  Last Pain:  Vitals:   03/12/21 0907  TempSrc:   PainSc: 8       Patients Stated Pain Goal: 4 ([id number])  Complications: No notable events documented.

## 2021-03-12 NOTE — Anesthesia Procedure Notes (Signed)
Procedure Name: Intubation Date/Time: 03/12/2021 10:15 AM Performed by: Georgia Duff, CRNA Pre-anesthesia Checklist: Patient identified, Emergency Drugs available, Suction available and Patient being monitored Patient Re-evaluated:Patient Re-evaluated prior to induction Oxygen Delivery Method: Circle System Utilized Preoxygenation: Pre-oxygenation with 100% oxygen Induction Type: IV induction Ventilation: Mask ventilation without difficulty Laryngoscope Size: Miller and 2 Grade View: Grade I Tube type: Oral Tube size: 7.5 mm Number of attempts: 1 Airway Equipment and Method: Stylet and Oral airway Placement Confirmation: ETT inserted through vocal cords under direct vision, positive ETCO2 and breath sounds checked- equal and bilateral Secured at: 21 cm Tube secured with: Tape Dental Injury: Teeth and Oropharynx as per pre-operative assessment

## 2021-03-13 ENCOUNTER — Inpatient Hospital Stay (HOSPITAL_COMMUNITY): Payer: PPO

## 2021-03-13 DIAGNOSIS — I739 Peripheral vascular disease, unspecified: Secondary | ICD-10-CM

## 2021-03-13 LAB — BASIC METABOLIC PANEL
Anion gap: 9 (ref 5–15)
BUN: 13 mg/dL (ref 8–23)
CO2: 21 mmol/L — ABNORMAL LOW (ref 22–32)
Calcium: 8.1 mg/dL — ABNORMAL LOW (ref 8.9–10.3)
Chloride: 106 mmol/L (ref 98–111)
Creatinine, Ser: 1.08 mg/dL (ref 0.61–1.24)
GFR, Estimated: >60 mL/min (ref >60)
Glucose, Bld: 158 mg/dL — ABNORMAL HIGH (ref 70–99)
Potassium: 3.9 mmol/L (ref 3.5–5.1)
Sodium: 136 mmol/L (ref 135–145)

## 2021-03-13 LAB — BPAM FFP
Blood Product Expiration Date: 202209182359
Blood Product Expiration Date: 202209192359
ISSUE DATE / TIME: 202209161746
ISSUE DATE / TIME: 202209161746
Unit Type and Rh: 6200
Unit Type and Rh: 6200

## 2021-03-13 LAB — PREPARE FRESH FROZEN PLASMA
Unit division: 0
Unit division: 0

## 2021-03-13 LAB — CBC
HCT: 28.6 % — ABNORMAL LOW (ref 39.0–52.0)
Hemoglobin: 9.6 g/dL — ABNORMAL LOW (ref 13.0–17.0)
MCH: 29 pg (ref 26.0–34.0)
MCHC: 33.6 g/dL (ref 30.0–36.0)
MCV: 86.4 fL (ref 80.0–100.0)
Platelets: 89 10*3/uL — ABNORMAL LOW (ref 150–400)
RBC: 3.31 MIL/uL — ABNORMAL LOW (ref 4.22–5.81)
RDW: 16.9 % — ABNORMAL HIGH (ref 11.5–15.5)
WBC: 13.5 10*3/uL — ABNORMAL HIGH (ref 4.0–10.5)
nRBC: 0 % (ref 0.0–0.2)

## 2021-03-13 LAB — LIPID PANEL
Cholesterol: 72 mg/dL (ref 0–200)
HDL: 27 mg/dL — ABNORMAL LOW (ref >40)
LDL Cholesterol: 37 mg/dL (ref 0–99)
Total CHOL/HDL Ratio: 2.7 RATIO
Triglycerides: 39 mg/dL (ref <150)
VLDL: 8 mg/dL (ref 0–40)

## 2021-03-13 LAB — HEPARIN LEVEL (UNFRACTIONATED): Heparin Unfractionated: 0.15 IU/mL — ABNORMAL LOW (ref 0.30–0.70)

## 2021-03-13 MED ORDER — HEPARIN (PORCINE) 25000 UT/250ML-% IV SOLN
1200.0000 [IU]/h | INTRAVENOUS | Status: DC
  Administered 2021-03-13: 800 [IU]/h via INTRAVENOUS
  Filled 2021-03-13 (×2): qty 250

## 2021-03-13 MED ORDER — MORPHINE SULFATE (PF) 2 MG/ML IV SOLN: 2.0000 mg | INTRAVENOUS | Status: DC | PRN

## 2021-03-13 NOTE — Progress Notes (Addendum)
Progress Note    03/13/2021 11:47 AM 1 Day Post-Op  Subjective: He is awake and alert this morning his primary complaint is right medial knee pain.  His Foley has been discontinued and he is voiding spontaneously.   Vitals:   03/13/21 0810 03/13/21 1137  BP: (!) 100/56 108/70  Pulse: 73 70  Resp: 16 16  Temp: 98.5 F (36.9 C) 98 F (36.7 C)  SpO2: 96% 96%    Physical Exam: General appearance: Awake, alert in no apparent distress Cardiac: Heart rate and rhythm are regular Respirations: Nonlabored Incisions: Right groin, thigh and lower leg dressings are dry and intact.  Right calf is soft. Extremities: Both feet are warm with intact sensation and motor function.   Pulse/Doppler exam: +  weak posterior tibial artery Doppler signal   CBC    Component Value Date/Time   WBC 13.5 (H) 03/13/2021 0149   RBC 3.31 (L) 03/13/2021 0149   HGB 9.6 (L) 03/13/2021 0149   HCT 28.6 (L) 03/13/2021 0149   PLT 89 (L) 03/13/2021 0149   MCV 86.4 03/13/2021 0149   MCH 29.0 03/13/2021 0149   MCHC 33.6 03/13/2021 0149   RDW 16.9 (H) 03/13/2021 0149   LYMPHSABS 1.3 05/26/2010 1427   MONOABS 0.5 05/26/2010 1427   EOSABS 0.0 05/26/2010 1427   BASOSABS 0.0 05/26/2010 1427    BMET    Component Value Date/Time   NA 136 03/13/2021 0149   K 3.9 03/13/2021 0149   CL 106 03/13/2021 0149   CO2 21 (L) 03/13/2021 0149   GLUCOSE 158 (H) 03/13/2021 0149   BUN 13 03/13/2021 0149   CREATININE 1.08 03/13/2021 0149   CALCIUM 8.1 (L) 03/13/2021 0149   GFRNONAA >60 03/13/2021 0149   GFRAA  10/06/2010 1259    >60        The eGFR has been calculated using the MDRD equation. This calculation has not been validated in all clinical situations. eGFR's persistently <60 mL/min signify possible Chronic Kidney Disease.     Intake/Output Summary (Last 24 hours) at 03/13/2021 1147 Last data filed at 03/13/2021 0359 Gross per 24 hour  Intake 7729 ml  Output 2510 ml  Net 5219 ml    HOSPITAL  MEDICATIONS Scheduled Meds:  sodium chloride   Intravenous Once   amLODipine  5 mg Oral Q breakfast   aspirin EC  81 mg Oral Q0600   Chlorhexidine Gluconate Cloth  6 each Topical Daily   clopidogrel  75 mg Oral Q supper   docusate sodium  100 mg Oral Daily   metoprolol tartrate  50 mg Oral TID   And   hydrochlorothiazide  25 mg Oral TID   pantoprazole  40 mg Oral Daily   rosuvastatin  10 mg Oral Daily   Continuous Infusions:  sodium chloride     sodium chloride 100 mL/hr at 03/12/21 2105   magnesium sulfate bolus IVPB     PRN Meds:.sodium chloride, acetaminophen **OR** acetaminophen, alum & mag hydroxide-simeth, bisacodyl, guaiFENesin-dextromethorphan, hydrALAZINE, labetalol, magnesium sulfate bolus IVPB, metoprolol tartrate, morphine injection, ondansetron, oxyCODONE-acetaminophen, phenol, polyethylene glycol, potassium chloride  Assessment and Plan: POD 1 right iliofemoral endarterectomy with vein patch, right femoral to above-knee popliteal artery bypass with PTFE graft.  Right common and external iliac artery stent placement.  His right PT Doppler signal is weak.  He has intact motor function and sensation.  Calf soft. Will verify patency of graft with lower extremity arterial duplex morning.  Will initiate heparin infusion.  Acute BLA: he  received 2 units of FFP and 4 units of packed red blood cells perioperatively.  Hemoglobin 9.6 this morning.  Continue monitoring.  Review of arterial duplex reveals patency of graft.   -DVT prophylaxis: Heparin infusion   Risa Grill, PA-C [facility] [phone] 03/13/2021  11:47 AM    VASCULAR STAFF ADDENDUM: I have independently interviewed and examined the patient. I agree with the above.  In short patient is status post right external iliac stenting, femoral to above-knee popliteal artery bypass using 6 mm PTFE. Patient doing well this morning.  Complaining of moderate to severe pain. No sensorimotor  deficits in the right leg. The bypass is patent judging by the waveforms in the distal SFA.  Patient with abnormal inflow, will discuss diagnostic angiogram Monday versus Tuesday. Evaluate, PT OT, patient can eat, pain control.   Cassandria Santee, MD [facility] Phone Number: [phone] 03/13/2021 12:30 PM

## 2021-03-13 NOTE — Progress Notes (Signed)
ANTICOAGULATION CONSULT NOTE  Pharmacy Consult for heparin Indication:  PAD, S/p right fem pop bypass with PTFE graft  No Known Allergies  Patient Measurements:   Heparin Dosing Weight: 55 kg  Vital Signs: Temp: 98.5 F (36.9 C) (09/17 1959) Temp Source: Oral (09/17 1959) BP: 106/72 (09/17 1959) Pulse Rate: 84 (09/17 1959)  Labs: Recent Labs    03/11/21 1112 03/12/21 1513 03/12/21 1731 03/12/21 1919 03/13/21 0149 03/13/21 2135  HGB 12.1*   < > 5.1* 9.9* 9.6*  --   HCT 38.0*   < > 15.0* 29.0* 28.6*  --   PLT 255  --   --   --  89*  --   APTT 38*  --   --   --   --   --   LABPROT 13.6  --   --   --   --   --   INR 1.0  --   --   --   --   --   HEPARINUNFRC  --   --   --   --   --  0.15*  CREATININE 1.14  --   --   --  1.08  --    < > = values in this interval not displayed.     Estimated Creatinine Clearance: 49.3 mL/min (by C-G formula based on SCr of 1.08 mg/dL).   Medical History: Past Medical History:  Diagnosis Date   Alcoholism ([place])    History of withdrawal and seizures   Atrial fibrillation ([place])    Taken off of Coumadin in 2009 in the setting of GI bleed   Chronic back pain    Colitis    4/11   Essential hypertension    Gout    History of GI bleed    Internal hemorrhoids    Colonoscopy 11/09   Osteoarthritis    Peripheral vascular disease ([place])    Schatzki's ring    EGD 11/09    Medications:  Medications Prior to Admission  Medication Sig Dispense Refill Last Dose   amLODipine (NORVASC) 5 MG tablet Take 5 mg by mouth daily with breakfast.    03/12/2021 at 0700   clopidogrel (PLAVIX) 75 MG tablet TAKE ONE TABLET BY MOUTH ONCE DAILY. (Patient taking differently: Take 75 mg by mouth daily with supper.) 90 tablet 2 03/11/2021   metoprolol-hydrochlorothiazide (LOPRESSOR HCT) 50-25 MG tablet Take 1 tablet by mouth 3 (three) times daily.   03/11/2021   oxyCODONE-acetaminophen (PERCOCET) 10-325 MG tablet Take 1 tablet by mouth every 6 (six) hours as  needed for pain. 10 tablet 0 Past Month   rosuvastatin (CRESTOR) 10 MG tablet TAKE (1) TABLET BY MOUTH ONCE DAILY. 30 tablet 0 Past Week   Omega-3 Fatty Acids (FISH OIL) 1000 MG CAPS Take 1,000 mg by mouth daily.    Unknown   Scheduled:   sodium chloride   Intravenous Once   amLODipine  5 mg Oral Q breakfast   aspirin EC  81 mg Oral Q0600   Chlorhexidine Gluconate Cloth  6 each Topical Daily   clopidogrel  75 mg Oral Q supper   docusate sodium  100 mg Oral Daily   metoprolol tartrate  50 mg Oral TID   And   hydrochlorothiazide  25 mg Oral TID   pantoprazole  40 mg Oral Daily   rosuvastatin  10 mg Oral Daily   Infusions:   sodium chloride     sodium chloride 100 mL/hr at 03/12/21 2105   heparin  800 Units/hr (03/13/21 1317)   magnesium sulfate bolus IVPB      Assessment: Patient is s/p right iliofemoral endarterectomy with vein patch, right femoral to above-knee popliteal artery bypass with PTFE graft. Pharmacy is consulted to dose heparin therapy. P  Goal of Therapy:  Heparin level 0.3-0.7 units/ml Monitor platelets by anticoagulation protocol: Yes   Plan:  -No bolus with recent procedure -Increase heparin to 950 units/hr -Heparin level in 8 hours and daily wth CBC daily  Hildred Laser, PharmD Clinical Pharmacist **Pharmacist phone directory can now be found on [email or web address] ([email or web address]).  Listed under [place].

## 2021-03-13 NOTE — Progress Notes (Signed)
VASCULAR LAB    Right lower extremity arterial duplex has been performed.  See CV proc for preliminary results.   Tatsuo Musial, RVT 03/13/2021, 1:24 PM

## 2021-03-13 NOTE — Progress Notes (Signed)
ANTICOAGULATION CONSULT NOTE - Initial Consult  Pharmacy Consult for heparin Indication:  PAD, S/p right fem pop bypass with PTFE graft  No Known Allergies  Patient Measurements:   Heparin Dosing Weight: 55 kg  Vital Signs: Temp: 98 F (36.7 C) (09/17 1137) Temp Source: Oral (09/17 1137) BP: 108/70 (09/17 1137) Pulse Rate: 70 (09/17 1137)  Labs: Recent Labs    03/11/21 1112 03/12/21 1513 03/12/21 1731 03/12/21 1919 03/13/21 0149  HGB 12.1*   < > 5.1* 9.9* 9.6*  HCT 38.0*   < > 15.0* 29.0* 28.6*  PLT 255  --   --   --  89*  APTT 38*  --   --   --   --   LABPROT 13.6  --   --   --   --   INR 1.0  --   --   --   --   CREATININE 1.14  --   --   --  1.08   < > = values in this interval not displayed.    Estimated Creatinine Clearance: 49.3 mL/min (by C-G formula based on SCr of 1.08 mg/dL).   Medical History: Past Medical History:  Diagnosis Date   Alcoholism ([place])    History of withdrawal and seizures   Atrial fibrillation ([place])    Taken off of Coumadin in 2009 in the setting of GI bleed   Chronic back pain    Colitis    4/11   Essential hypertension    Gout    History of GI bleed    Internal hemorrhoids    Colonoscopy 11/09   Osteoarthritis    Peripheral vascular disease ([place])    Schatzki's ring    EGD 11/09    Medications:  Medications Prior to Admission  Medication Sig Dispense Refill Last Dose   amLODipine (NORVASC) 5 MG tablet Take 5 mg by mouth daily with breakfast.    03/12/2021 at 0700   clopidogrel (PLAVIX) 75 MG tablet TAKE ONE TABLET BY MOUTH ONCE DAILY. (Patient taking differently: Take 75 mg by mouth daily with supper.) 90 tablet 2 03/11/2021   metoprolol-hydrochlorothiazide (LOPRESSOR HCT) 50-25 MG tablet Take 1 tablet by mouth 3 (three) times daily.   03/11/2021   oxyCODONE-acetaminophen (PERCOCET) 10-325 MG tablet Take 1 tablet by mouth every 6 (six) hours as needed for pain. 10 tablet 0 Past Month   rosuvastatin (CRESTOR) 10 MG tablet TAKE  (1) TABLET BY MOUTH ONCE DAILY. 30 tablet 0 Past Week   Omega-3 Fatty Acids (FISH OIL) 1000 MG CAPS Take 1,000 mg by mouth daily.    Unknown   Scheduled:   sodium chloride   Intravenous Once   amLODipine  5 mg Oral Q breakfast   aspirin EC  81 mg Oral Q0600   Chlorhexidine Gluconate Cloth  6 each Topical Daily   clopidogrel  75 mg Oral Q supper   docusate sodium  100 mg Oral Daily   metoprolol tartrate  50 mg Oral TID   And   hydrochlorothiazide  25 mg Oral TID   pantoprazole  40 mg Oral Daily   rosuvastatin  10 mg Oral Daily   Infusions:   sodium chloride     sodium chloride 100 mL/hr at 03/12/21 2105   magnesium sulfate bolus IVPB      Assessment: Patient is s/p right iliofemoral endarterectomy with vein patch, right femoral to above-knee popliteal artery bypass with PTFE graft. Pharmacy is consulted to start heparin therapy. Patient is taking clopidogrel.  Patient on no anticoagulants PTA. Hemoglobin and hematocrit are stable. Platelets are 89. Will continue to monitor platelet count as heparin therapy is started.   Goal of Therapy:  Heparin level 0.3-0.7 units/ml Monitor platelets by anticoagulation protocol: Yes   Plan:  Start heparin at 800 units/hour without bolus POD1 Check heparin level in 8 hours Check daily heparin level and CBC Monitor for signs and symptoms of bleeding

## 2021-03-13 NOTE — Evaluation (Signed)
Physical Therapy Evaluation Patient Details Name: Todd [name] MRN: [id number] DOB: Sep 02, 1950 Today's Date: 03/13/2021  History of Present Illness  The pt is a 70 yo male presenting 9/16 for R femoral-popliteal artery bypass grafting due to PAD and pain at rest in R foot. PMH includes: bilateral lower extremity arterial insufficiency, aortogram via left femoral approach and bilateral external iliac intervention, alcoholism, a fib, HTN, gout, and osteoarthritis. .  Clinical Impression  Pt in bed upon arrival of PT, agreeable to evaluation at this time. Prior to admission the pt was completely independent without need for AD, living in a home with 2 steps to enter with his family. The pt now presents with limitations in functional mobility, activity tolerance, strength, ROM, and endurance due to above dx and resulting pain, and will continue to benefit from skilled PT to address these deficits. The pt was able to demo good initial bed mobility and sit-stand transfers without assist at this time. He was limited to short bout of ambulation OOB, but was still able to complete with good stability and no need for UE support or assist. Anticipate good progression with continued therapy efforts acutely, will likely be safe to d/c home with family support and no further therapies. Given limitations today, would benefit from HHPT.         Recommendations for follow up therapy are one component of a multi-disciplinary discharge planning process, led by the attending physician.  Recommendations may be updated based on patient status, additional functional criteria and insurance authorization.  Follow Up Recommendations Home health PT;Supervision for mobility/OOB (vs no PT pending progression)    Equipment Recommendations  None recommended by PT    Recommendations for Other Services       Precautions / Restrictions Precautions Precautions: Fall Restrictions Weight Bearing Restrictions: No       Mobility  Bed Mobility Overal bed mobility: Needs Assistance Bed Mobility: Supine to Sit     Supine to sit: Supervision          Transfers Overall transfer level: Needs assistance Equipment used: None Transfers: Sit to/from Stand;Stand Pivot Transfers Sit to Stand: Supervision Stand pivot transfers: Supervision       General transfer comment: nno physical assist, pt with trunk flexed for comfort  Ambulation/Gait Ambulation/Gait assistance: Supervision Gait Distance (Feet): 4 Feet Assistive device: None Gait Pattern/deviations: Step-through pattern Gait velocity: decreased   General Gait Details: pt with small steps from EOB to recliner, no LOB, no UE support or assist needed. maintained trunk flexion due to reports of painful abdomen.     Balance Overall balance assessment: Mild deficits observed, not formally tested                                           Pertinent Vitals/Pain Pain Assessment: Faces Faces Pain Scale: Hurts even more Pain Location: R leg/incisional site Pain Descriptors / Indicators: Grimacing;Guarding;Sharp Pain Intervention(s): Limited activity within patient's tolerance;Repositioned;Monitored during session;Premedicated before session (pt given morphine prior to session)    Home Living Family/patient expects to be discharged to:: Private residence Living Arrangements: Spouse/significant other Available Help at Discharge: Family;Available 24 hours/day Type of Home: House Home Access: Stairs to enter Entrance Stairs-Rails: None Entrance Stairs-Number of Steps: 2 Home Layout: One level Home Equipment: None      Prior Function Level of Independence: Independent         Comments:  States no assist with ADL/IADL, continues to drive, no AD for mobility.     Hand Dominance   Dominant Hand: Right    Extremity/Trunk Assessment   Upper Extremity Assessment Upper Extremity Assessment: Defer to OT evaluation     Lower Extremity Assessment Lower Extremity Assessment: RLE deficits/detail (LLE WFL) RLE Deficits / Details: limited by pain from incision sites, able to achieve full ROM against gravity RLE: Unable to fully assess due to pain RLE Sensation: WNL RLE Coordination: WNL    Cervical / Trunk Assessment Cervical / Trunk Assessment: Normal  Communication   Communication: No difficulties  Cognition Arousal/Alertness: Awake/alert Behavior During Therapy: WFL for tasks assessed/performed Overall Cognitive Status: Within Functional Limits for tasks assessed                                 General Comments: Family stating recent pain med has him a little loopy.      General Comments General comments (skin integrity, edema, etc.): VSS on RA     PT Assessment Patient needs continued PT services  PT Problem List Decreased activity tolerance;Decreased range of motion;Decreased balance;Decreased mobility;Pain       PT Treatment Interventions DME instruction;Gait training;Stair training;Functional mobility training;Therapeutic activities;Therapeutic exercise;Balance training;Patient/family education    PT Goals (Current goals can be found in the Care Plan section)  Acute Rehab PT Goals Patient Stated Goal: Go home by Sunday PT Goal Formulation: With patient Time For Goal Achievement: 03/27/21 Potential to Achieve Goals: Good    Frequency Min 3X/week        Co-evaluation PT/OT/SLP Co-Evaluation/Treatment: Yes Reason for Co-Treatment: For patient/therapist safety;To address functional/ADL transfers (limited activity tolerance) PT goals addressed during session: Mobility/safety with mobility;Balance;Proper use of DME;Strengthening/ROM OT goals addressed during session: ADL's and self-care       AM-PAC PT "6 Clicks" Mobility  Outcome Measure Help needed turning from your back to your side while in a flat bed without using bedrails?: A Little Help needed moving from  lying on your back to sitting on the side of a flat bed without using bedrails?: A Little Help needed moving to and from a bed to a chair (including a wheelchair)?: A Little Help needed standing up from a chair using your arms (e.g., wheelchair or bedside chair)?: A Little Help needed to walk in hospital room?: A Little Help needed climbing 3-5 steps with a railing? : A Little 6 Click Score: 18    End of Session   Activity Tolerance: Patient tolerated treatment well;Patient limited by fatigue;Patient limited by pain Patient left: in chair;with call bell/phone within reach Nurse Communication: Mobility status PT Visit Diagnosis: Other abnormalities of gait and mobility (R26.89);Pain Pain - Right/Left: Right Pain - part of body: Leg    Time: [id number] PT Time Calculation (min) (ACUTE ONLY): 16 min   Charges:   PT Evaluation $PT Eval Low Complexity: 1 Low          West Carbo, PT, DPT   Acute Rehabilitation Department Pager #: [phone]  Sandra Cockayne 03/13/2021, 2:30 PM

## 2021-03-13 NOTE — Evaluation (Signed)
Occupational Therapy Evaluation Patient Details Name: Todd [name] MRN: [id number] DOB: 1951/01/27 Today's Date: 03/13/2021   History of Present Illness 70 y.o. male with worsening RLE pain.  PMH includes:Atrial fibrillation, Chronic back pain, Essential hypertension, Gout, GI bleed.  S/p right fem pop bypass with PTFE graft.   Clinical Impression   Patient admitted for the diagnosis and procedure above.  PTA he lives with his spouse, who is able to assist as needed.  Primary deficit is R leg pain.  Currently he is needing supervision for mobility, and up to Mod A for lower body ADL.  OT to follow in the acute setting, but no post acute OT is anticipated.        Recommendations for follow up therapy are one component of a multi-disciplinary discharge planning process, led by the attending physician.  Recommendations may be updated based on patient status, additional functional criteria and insurance authorization.   Follow Up Recommendations  No OT follow up    Equipment Recommendations  None recommended by OT    Recommendations for Other Services       Precautions / Restrictions Precautions Precautions: Fall Restrictions Weight Bearing Restrictions: No      Mobility Bed Mobility Overal bed mobility: Needs Assistance Bed Mobility: Supine to Sit     Supine to sit: Supervision       Patient Response: Flat affect  Transfers Overall transfer level: Needs assistance   Transfers: Sit to/from Stand;Stand Pivot Transfers Sit to Stand: Supervision Stand pivot transfers: Supervision            Balance Overall balance assessment: Mild deficits observed, not formally tested                                         ADL either performed or assessed with clinical judgement   ADL               Lower Body Bathing: Moderate assistance;Sit to/from stand       Lower Body Dressing: Moderate assistance;Sit to/from stand   Toilet Transfer:  Supervision/safety                   Vision Baseline Vision/History: 1 Wears glasses Patient Visual Report: No change from baseline Vision Assessment?: No apparent visual deficits     Perception     Praxis      Pertinent Vitals/Pain Pain Assessment: Faces Faces Pain Scale: Hurts even more Pain Location: R leg/incisional site Pain Descriptors / Indicators: Grimacing;Guarding;Sharp Pain Intervention(s): Monitored during session     Hand Dominance Right   Extremity/Trunk Assessment Upper Extremity Assessment Upper Extremity Assessment: Overall WFL for tasks assessed   Lower Extremity Assessment Lower Extremity Assessment: Defer to PT evaluation   Cervical / Trunk Assessment Cervical / Trunk Assessment: Normal   Communication Communication Communication: No difficulties   Cognition Arousal/Alertness: Awake/alert Behavior During Therapy: WFL for tasks assessed/performed Overall Cognitive Status: Within Functional Limits for tasks assessed                                 General Comments: Family stating recent pain med has him a little loopy.   General Comments   VSS on RA    Exercises     Shoulder Instructions      Home Living Family/patient expects to be discharged to::  Private residence Living Arrangements: Spouse/significant other Available Help at Discharge: Family;Available 24 hours/day Type of Home: House Home Access: Stairs to enter [employer] of Steps: 2 Entrance Stairs-Rails: None Home Layout: One level     Bathroom Shower/Tub: Teacher, early years/pre: Standard     Home Equipment: None          Prior Functioning/Environment Level of Independence: Independent        Comments: States no assist with ADL/IADL, continues to drive, no AD for mobility.        OT Problem List: Decreased range of motion;Impaired balance (sitting and/or standing);Pain      OT Treatment/Interventions: Self-care/ADL  training;Therapeutic activities    OT Goals(Current goals can be found in the care plan section) Acute Rehab OT Goals Patient Stated Goal: Go home by Sunday OT Goal Formulation: With patient Time For Goal Achievement: 03/27/21 Potential to Achieve Goals: Fair ADL Goals Pt Will Perform Lower Body Bathing: with set-up;sit to/from stand Pt Will Perform Lower Body Dressing: with set-up;sit to/from stand Pt Will Transfer to Toilet: Independently;ambulating;regular height toilet  OT Frequency: Min 2X/week   Barriers to D/C:    none       Co-evaluation PT/OT/SLP Co-Evaluation/Treatment: Yes Reason for Co-Treatment: Complexity of the patient's impairments (multi-system involvement);Necessary to address cognition/behavior during functional activity;For patient/therapist safety;To address functional/ADL transfers   OT goals addressed during session: ADL's and self-care      AM-PAC OT "6 Clicks" Daily Activity     Outcome Measure Help from another person eating meals?: None Help from another person taking care of personal grooming?: None Help from another person toileting, which includes using toliet, bedpan, or urinal?: A Little Help from another person bathing (including washing, rinsing, drying)?: A Little Help from another person to put on and taking off regular upper body clothing?: None Help from another person to put on and taking off regular lower body clothing?: A Little 6 Click Score: 21   End of Session Nurse Communication: Mobility status  Activity Tolerance: Patient tolerated treatment well Patient left: in chair;with call bell/phone within reach  OT Visit Diagnosis: Pain Pain - Right/Left: Right Pain - part of body: Leg                Time: [id number] OT Time Calculation (min): 25 min Charges:  OT General Charges $OT Visit: 1 Visit OT Evaluation $OT Eval Moderate Complexity: 1 Mod  03/13/2021  RP, OTR/L  Acute Rehabilitation Services  Office:   [phone]   Metta Clines 03/13/2021, 2:08 PM

## 2021-03-13 NOTE — Progress Notes (Signed)
Foley catheter removed per order. Pt educated on the use of his urinal and instructed to give staff a call for assistance to use the restroom. Pt able to void small amount before leaving the room by dangling on the side of the bed.

## 2021-03-14 LAB — CBC
HCT: 28.5 % — ABNORMAL LOW (ref 39.0–52.0)
Hemoglobin: 9.6 g/dL — ABNORMAL LOW (ref 13.0–17.0)
MCH: 29 pg (ref 26.0–34.0)
MCHC: 33.7 g/dL (ref 30.0–36.0)
MCV: 86.1 fL (ref 80.0–100.0)
Platelets: 91 10*3/uL — ABNORMAL LOW (ref 150–400)
RBC: 3.31 MIL/uL — ABNORMAL LOW (ref 4.22–5.81)
RDW: 17.3 % — ABNORMAL HIGH (ref 11.5–15.5)
WBC: 12.9 10*3/uL — ABNORMAL HIGH (ref 4.0–10.5)
nRBC: 0 % (ref 0.0–0.2)

## 2021-03-14 LAB — HEPARIN LEVEL (UNFRACTIONATED)
Heparin Unfractionated: 0.19 IU/mL — ABNORMAL LOW (ref 0.30–0.70)
Heparin Unfractionated: 0.24 IU/mL — ABNORMAL LOW (ref 0.30–0.70)

## 2021-03-14 NOTE — Progress Notes (Signed)
ANTICOAGULATION CONSULT NOTE  Pharmacy Consult for heparin Indication:  PAD, S/p right fem pop bypass with PTFE graft  No Known Allergies  Patient Measurements:   Heparin Dosing Weight: 55 kg  Vital Signs: Temp: 98.6 F (37 C) (09/18 1351) Temp Source: Oral (09/18 1351) BP: 115/77 (09/18 1351) Pulse Rate: 83 (09/18 1351)  Labs: Recent Labs    03/12/21 1919 03/13/21 0149 03/13/21 2135 03/14/21 0131 03/14/21 0613  HGB 9.9* 9.6*  --  9.6*  --   HCT 29.0* 28.6*  --  28.5*  --   PLT  --  89*  --  91*  --   HEPARINUNFRC  --   --  0.15*  --  0.19*  CREATININE  --  1.08  --   --   --      Estimated Creatinine Clearance: 49.3 mL/min (by C-G formula based on SCr of 1.08 mg/dL).    Infusions:   sodium chloride     sodium chloride 100 mL/hr at 03/12/21 2105   heparin 1,100 Units/hr (03/14/21 0842)   magnesium sulfate bolus IVPB      Assessment: Patient is s/p right iliofemoral endarterectomy with vein patch, right femoral to above-knee popliteal artery bypass with PTFE graft. Pharmacy is consulted to dose heparin therapy.   Heparin level this afternoon remains SUBtherapeutic though trending up (HL 0.24 << 0.19, goal of 0.3-0.7). Plts low at 91 but stable from yesterday, appears the patient does have history of previously low platelets as well. No overt bleeding or infusion issues noted per discussion with RN.   Goal of Therapy:  Heparin level 0.3-0.7 units/ml Monitor platelets by anticoagulation protocol: Yes   Plan:  - No bolus with recent procedure - Increase Heparin to 1200 units/hr (12 ml/hr) - Will continue to monitor for any signs/symptoms of bleeding and will follow up with heparin level in 8 hours   Thank you for allowing pharmacy to be a part of this patient's care.  Alycia Rossetti, PharmD, BCPS Clinical Pharmacist Clinical phone for 03/14/2021: [phone] 03/14/2021 4:37 PM   **Pharmacist phone directory can now be found on [email or web address] ([email or web address]).  Listed  under [place].

## 2021-03-14 NOTE — Progress Notes (Signed)
Mobility Specialist Progress Note    03/14/21 1435  Mobility  Activity Ambulated in room  Level of Assistance Modified independent, requires aide device or extra time  Assistive Device Front wheel walker  Distance Ambulated (ft) 36 ft  Mobility Ambulated with assistance in room  Mobility Response Tolerated well  Mobility performed by Mobility specialist  Bed Position Chair  $Mobility charge 1 Mobility   Pre-Mobility: 81 HR, 108/74 BP Post-Mobility: 91 HR  Pt received in bed and took some encouragement to move because very cold. Pt asx in room and returned to chair. C/o tingly feeling on ball of right foot. Call bell in reach.   Hildred Alamin Mobility Specialist  Mobility Specialist Phone: [phone]

## 2021-03-14 NOTE — Progress Notes (Addendum)
   VASCULAR SURGERY ASSESSMENT & PLAN:   POD 2 right iliofemoral endarterectomy with vein patch, right femoral to above-knee popliteal artery bypass with PTFE graft.  Right common and external iliac artery stent placement.  His right PT Doppler signal is improved.  He has intact motor function and sensation.  Calf soft. Will initiate heparin infusion.   Acute BLA: he received 2 units of FFP and 4 units of packed red blood cells perioperatively.  Hemoglobin 9.6 this morning.  Continue monitoring.  Thrombocytopenia: platelet count stable last 24 hours. Continue heparin and monitor   Review of arterial duplex reveals patency of graft.    -DVT prophylaxis: Heparin infusion  VASCULAR STAFF ADDENDUM: I have independently interviewed and examined the patient. I agree with the above.  Regular diet, out of bed daily, physical therapy. Right lower extremity signal improved from yesterday.  Possible inflow interrogation with angiogram Tuesday.  We will discuss with Dr. Gilmer Mor, MD [facility] Phone Number: [phone] 03/14/2021 1:18 PM      SUBJECTIVE:   Less pain at right medial knee.  PHYSICAL EXAM:   Vitals:   03/13/21 1959 03/13/21 2352 03/14/21 0334 03/14/21 0835  BP: 106/72 111/71 112/80 118/77  Pulse: 84 73 77 88  Resp: '18 20 17 18  '$ Temp: 98.5 F (36.9 C) 97.6 F (36.4 C) 98.9 F (37.2 C) 97.8 F (36.6 C)  TempSrc: Oral Oral Oral Oral  SpO2: 95% 96% 94%    General appearance: Awake, alert in no apparent distress Cardiac: Heart rate and rhythm are regular Respirations: Nonlabored Incisions: Right groin, thigh and lower leg dressings are dry and intact.  Right calf is soft. Extremities: Both feet are warm with intact sensation and motor function.   Pulse/Doppler exam: + multiphasic right posterior tibial artery Doppler signal  LABS:   Lab Results  Component Value Date   WBC 12.9 (H) 03/14/2021   HGB 9.6 (L)  03/14/2021   HCT 28.5 (L) 03/14/2021   MCV 86.1 03/14/2021   PLT 91 (L) 03/14/2021   Lab Results  Component Value Date   CREATININE 1.08 03/13/2021   Lab Results  Component Value Date   INR 1.0 03/11/2021   CBG (last 3)  Recent Labs    03/12/21 1512  GLUCAP 132*    PROBLEM LIST:    Active Problems:   PAD (peripheral artery disease) (HCC)   CURRENT MEDS:    sodium chloride   Intravenous Once   amLODipine  5 mg Oral Q breakfast   aspirin EC  81 mg Oral Q0600   Chlorhexidine Gluconate Cloth  6 each Topical Daily   clopidogrel  75 mg Oral Q supper   docusate sodium  100 mg Oral Daily   metoprolol tartrate  50 mg Oral TID   And   hydrochlorothiazide  25 mg Oral TID   pantoprazole  40 mg Oral Daily   rosuvastatin  10 mg Oral Daily   Barbie Banner, [place]  Office: [phone] 03/14/2021

## 2021-03-14 NOTE — Progress Notes (Signed)
ANTICOAGULATION CONSULT NOTE  Pharmacy Consult for heparin Indication:  PAD, S/p right fem pop bypass with PTFE graft  No Known Allergies  Patient Measurements:   Heparin Dosing Weight: 55 kg  Vital Signs: Temp: 98.9 F (37.2 C) (09/18 0334) Temp Source: Oral (09/18 0334) BP: 112/80 (09/18 0334) Pulse Rate: 77 (09/18 0334)  Labs: Recent Labs    03/11/21 1112 03/12/21 1513 03/12/21 1919 03/13/21 0149 03/13/21 2135 03/14/21 0131 03/14/21 0613  HGB 12.1*   < > 9.9* 9.6*  --  9.6*  --   HCT 38.0*   < > 29.0* 28.6*  --  28.5*  --   PLT 255  --   --  89*  --  91*  --   APTT 38*  --   --   --   --   --   --   LABPROT 13.6  --   --   --   --   --   --   INR 1.0  --   --   --   --   --   --   HEPARINUNFRC  --   --   --   --  0.15*  --  0.19*  CREATININE 1.14  --   --  1.08  --   --   --    < > = values in this interval not displayed.     Estimated Creatinine Clearance: 49.3 mL/min (by C-G formula based on SCr of 1.08 mg/dL).   Medical History: Past Medical History:  Diagnosis Date   Alcoholism ([place])    History of withdrawal and seizures   Atrial fibrillation ([place])    Taken off of Coumadin in 2009 in the setting of GI bleed   Chronic back pain    Colitis    4/11   Essential hypertension    Gout    History of GI bleed    Internal hemorrhoids    Colonoscopy 11/09   Osteoarthritis    Peripheral vascular disease ([place])    Schatzki's ring    EGD 11/09    Medications:  Medications Prior to Admission  Medication Sig Dispense Refill Last Dose   amLODipine (NORVASC) 5 MG tablet Take 5 mg by mouth daily with breakfast.    03/12/2021 at 0700   clopidogrel (PLAVIX) 75 MG tablet TAKE ONE TABLET BY MOUTH ONCE DAILY. (Patient taking differently: Take 75 mg by mouth daily with supper.) 90 tablet 2 03/11/2021   metoprolol-hydrochlorothiazide (LOPRESSOR HCT) 50-25 MG tablet Take 1 tablet by mouth 3 (three) times daily.   03/11/2021   oxyCODONE-acetaminophen (PERCOCET) 10-325 MG  tablet Take 1 tablet by mouth every 6 (six) hours as needed for pain. 10 tablet 0 Past Month   rosuvastatin (CRESTOR) 10 MG tablet TAKE (1) TABLET BY MOUTH ONCE DAILY. 30 tablet 0 Past Week   Omega-3 Fatty Acids (FISH OIL) 1000 MG CAPS Take 1,000 mg by mouth daily.    Unknown   Scheduled:   sodium chloride   Intravenous Once   amLODipine  5 mg Oral Q breakfast   aspirin EC  81 mg Oral Q0600   Chlorhexidine Gluconate Cloth  6 each Topical Daily   clopidogrel  75 mg Oral Q supper   docusate sodium  100 mg Oral Daily   metoprolol tartrate  50 mg Oral TID   And   hydrochlorothiazide  25 mg Oral TID   pantoprazole  40 mg Oral Daily   rosuvastatin  10 mg Oral  Daily   Infusions:   sodium chloride     sodium chloride 100 mL/hr at 03/12/21 2105   heparin 950 Units/hr (03/13/21 2234)   magnesium sulfate bolus IVPB      Assessment: Patient is s/p right iliofemoral endarterectomy with vein patch, right femoral to above-knee popliteal artery bypass with PTFE graft. Pharmacy is consulted to dose heparin therapy. Heparin remains subtherapeutic at 0.19. Heparin level drawn slightly early. No bleeding noted. CBC stable.  Goal of Therapy:  Heparin level 0.3-0.7 units/ml Monitor platelets by anticoagulation protocol: Yes   Plan:  -No bolus with recent procedure -Increase heparin to 1100 units/hr -Heparin level in 8 hours and daily wth CBC daily  Thank you for allowing pharmacy to participate in this patient's care.  Reatha Harps, PharmD PGY1 Pharmacy Resident 03/14/2021 7:46 AM Check [email or web address] for unit specific pharmacy number

## 2021-03-15 ENCOUNTER — Encounter (HOSPITAL_COMMUNITY)
Admission: RE | Disposition: A | Discharge status: Home Health | Payer: Self-pay | Source: Home / Self Care | Attending: Surgery

## 2021-03-15 ENCOUNTER — Encounter (HOSPITAL_COMMUNITY): Payer: Self-pay | Admitting: Surgery

## 2021-03-15 DIAGNOSIS — Z95828 Presence of other vascular implants and grafts: Secondary | ICD-10-CM | POA: Diagnosis not present

## 2021-03-15 DIAGNOSIS — I70202 Unspecified atherosclerosis of native arteries of extremities, left leg: Secondary | ICD-10-CM | POA: Diagnosis not present

## 2021-03-15 DIAGNOSIS — T82898A Other specified complication of vascular prosthetic devices, implants and grafts, initial encounter: Secondary | ICD-10-CM | POA: Diagnosis not present

## 2021-03-15 HISTORY — PX: PERIPHERAL VASCULAR INTERVENTION: CATH118257

## 2021-03-15 HISTORY — PX: LOWER EXTREMITY ANGIOGRAPHY: CATH118251

## 2021-03-15 LAB — POCT ACTIVATED CLOTTING TIME
Activated Clotting Time: 271 seconds
Activated Clotting Time: 208 seconds
Activated Clotting Time: 184 seconds

## 2021-03-15 LAB — CBC
HCT: 28.5 % — ABNORMAL LOW (ref 39.0–52.0)
Hemoglobin: 9.7 g/dL — ABNORMAL LOW (ref 13.0–17.0)
MCH: 29.9 pg (ref 26.0–34.0)
MCHC: 34 g/dL (ref 30.0–36.0)
MCV: 88 fL (ref 80.0–100.0)
Platelets: 109 10*3/uL — ABNORMAL LOW (ref 150–400)
RBC: 3.24 MIL/uL — ABNORMAL LOW (ref 4.22–5.81)
RDW: 16.6 % — ABNORMAL HIGH (ref 11.5–15.5)
WBC: 14.3 10*3/uL — ABNORMAL HIGH (ref 4.0–10.5)
nRBC: 0 % (ref 0.0–0.2)

## 2021-03-15 LAB — HEPARIN LEVEL (UNFRACTIONATED)
Heparin Unfractionated: 0.65 IU/mL (ref 0.30–0.70)
Heparin Unfractionated: 0.57 IU/mL (ref 0.30–0.70)

## 2021-03-15 SURGERY — LOWER EXTREMITY ANGIOGRAPHY
Anesthesia: LOCAL | Laterality: Right

## 2021-03-15 MED ORDER — HEPARIN SODIUM (PORCINE) 1000 UNIT/ML IJ SOLN
INTRAMUSCULAR | Status: DC | PRN
  Administered 2021-03-15: 5000 [IU] via INTRAVENOUS

## 2021-03-15 MED ORDER — HYDRALAZINE HCL 20 MG/ML IJ SOLN
5.0000 mg | INTRAMUSCULAR | Status: DC | PRN
Start: 2021-03-15 — End: 2021-03-16

## 2021-03-15 MED ORDER — FENTANYL CITRATE (PF) 100 MCG/2ML IJ SOLN
INTRAMUSCULAR | Status: AC
  Filled 2021-03-15: qty 2

## 2021-03-15 MED ORDER — HEPARIN (PORCINE) IN NACL 1000-0.9 UT/500ML-% IV SOLN
INTRAVENOUS | Status: AC
  Filled 2021-03-15: qty 1000

## 2021-03-15 MED ORDER — ACETAMINOPHEN 325 MG PO TABS: 650.0000 mg | ORAL_TABLET | ORAL | Status: DC | PRN

## 2021-03-15 MED ORDER — MIDAZOLAM HCL 2 MG/2ML IJ SOLN
INTRAMUSCULAR | Status: DC | PRN
  Administered 2021-03-15: 1 mg via INTRAVENOUS

## 2021-03-15 MED ORDER — LIDOCAINE HCL (PF) 1 % IJ SOLN
INTRAMUSCULAR | Status: DC | PRN
  Administered 2021-03-15: 15 mL via INTRADERMAL

## 2021-03-15 MED ORDER — LIDOCAINE HCL (PF) 1 % IJ SOLN
INTRAMUSCULAR | Status: AC
  Filled 2021-03-15: qty 30

## 2021-03-15 MED ORDER — FENTANYL CITRATE (PF) 100 MCG/2ML IJ SOLN
INTRAMUSCULAR | Status: DC | PRN
  Administered 2021-03-15: 25 ug via INTRAVENOUS

## 2021-03-15 MED ORDER — HEPARIN SODIUM (PORCINE) 1000 UNIT/ML IJ SOLN
INTRAMUSCULAR | Status: AC
  Filled 2021-03-15: qty 1

## 2021-03-15 MED ORDER — METOPROLOL TARTRATE 50 MG PO TABS
50.0000 mg | ORAL_TABLET | Freq: Three times a day (TID) | ORAL | Status: DC
  Administered 2021-03-15 – 2021-03-16 (×2): 50 mg via ORAL
  Filled 2021-03-15 (×2): qty 1

## 2021-03-15 MED ORDER — ONDANSETRON HCL 4 MG/2ML IJ SOLN: 4.0000 mg | Freq: Four times a day (QID) | INTRAMUSCULAR | Status: DC | PRN

## 2021-03-15 MED ORDER — SODIUM CHLORIDE 0.9 % IV SOLN: 250.0000 mL | INTRAVENOUS | Status: DC | PRN

## 2021-03-15 MED ORDER — LABETALOL HCL 5 MG/ML IV SOLN: 10.0000 mg | INTRAVENOUS | Status: DC | PRN

## 2021-03-15 MED ORDER — HEPARIN (PORCINE) 25000 UT/250ML-% IV SOLN
1200.0000 [IU]/h | INTRAVENOUS | Status: DC
  Administered 2021-03-15: 1200 [IU]/h via INTRAVENOUS
  Filled 2021-03-15: qty 250

## 2021-03-15 MED ORDER — IODIXANOL 320 MG/ML IV SOLN
INTRAVENOUS | Status: DC | PRN
  Administered 2021-03-15: 85 mL via INTRA_ARTERIAL

## 2021-03-15 MED ORDER — MIDAZOLAM HCL 2 MG/2ML IJ SOLN
INTRAMUSCULAR | Status: AC
  Filled 2021-03-15: qty 2

## 2021-03-15 MED ORDER — SODIUM CHLORIDE 0.9 % IV SOLN: INTRAVENOUS | Status: DC

## 2021-03-15 MED ORDER — HEPARIN (PORCINE) IN NACL 1000-0.9 UT/500ML-% IV SOLN
INTRAVENOUS | Status: DC | PRN
  Administered 2021-03-15 (×2): 500 mL

## 2021-03-15 MED ORDER — HYDROCHLOROTHIAZIDE 25 MG PO TABS
25.0000 mg | ORAL_TABLET | Freq: Every day | ORAL | Status: DC
  Administered 2021-03-16: 25 mg via ORAL
  Filled 2021-03-15: qty 1

## 2021-03-15 SURGICAL SUPPLY — 13 items
CATH ANGIO 5F PIGTAIL 65CM (CATHETERS) ×3 IMPLANT
GLIDEWIRE ADV .035X260CM (WIRE) ×3 IMPLANT
KIT ENCORE 26 ADVANTAGE (KITS) ×3 IMPLANT
KIT MICROPUNCTURE NIT STIFF (SHEATH) ×3 IMPLANT
KIT PV (KITS) ×3 IMPLANT
SHEATH FLEXOR ANSEL 1 7F 45CM (SHEATH) ×3 IMPLANT
SHEATH PINNACLE 5F 10CM (SHEATH) ×3 IMPLANT
SHEATH PROBE COVER 6X72 (BAG) ×3 IMPLANT
STENT VIABAHN VBX 7X59X80 (Permanent Stent) ×3 IMPLANT
SYR MEDRAD MARK V 150ML (SYRINGE) ×3 IMPLANT
TRANSDUCER W/STOPCOCK (MISCELLANEOUS) ×3 IMPLANT
TRAY PV CATH (CUSTOM PROCEDURE TRAY) ×3 IMPLANT
WIRE BENTSON .035X145CM (WIRE) ×3 IMPLANT

## 2021-03-15 NOTE — Progress Notes (Signed)
Occupational Therapy Treatment Patient Details Name: Todd [name] MRN: [id number] DOB: 12/25/1950 Today's Date: 03/15/2021   History of present illness The pt is a 70 yo male presenting 9/16 for R femoral-popliteal artery bypass grafting due to PAD and pain at rest in R foot. PMH includes: bilateral lower extremity arterial insufficiency, aortogram via left femoral approach and bilateral external iliac intervention, alcoholism, a fib, HTN, gout, and osteoarthritis.   OT comments  Pt with gradual progress towards goals though resistant for OOB activities with OT, citing he "already did all of that" with PT earlier this AM. Discussed strategies for LB ADLs due to difficulty reaching affected LE s/p procedures with pt able to return demo ability to reach while long sitting in bed. Discussed trial of tub transfers though pt declined, reporting if he cannot step over tub, then he just wont get into the tub. Pt reports possible further procedures, so will continue to follow and monitor for changes. Anticipate no OT needs at DC.    Recommendations for follow up therapy are one component of a multi-disciplinary discharge planning process, led by the attending physician.  Recommendations may be updated based on patient status, additional functional criteria and insurance authorization.    Follow Up Recommendations  No OT follow up    Equipment Recommendations  None recommended by OT    Recommendations for Other Services      Precautions / Restrictions Precautions Precautions: Fall Restrictions Weight Bearing Restrictions: No       Mobility Bed Mobility Overal bed mobility: Modified Independent Bed Mobility: Supine to Sit     Supine to sit: Supervision     General bed mobility comments: able to long sit without assist for LB dressing    Transfers Overall transfer level: Needs assistance Equipment used: None Transfers: Sit to/from Stand Sit to Stand: Supervision          General transfer comment: declined    Balance Overall balance assessment: Mild deficits observed, not formally tested                                         ADL either performed or assessed with clinical judgement   ADL Overall ADL's : Needs assistance/impaired                     Lower Body Dressing: Supervision/safety;Bed level Lower Body Dressing Details (indicate cue type and reason): able to demo ability to reach B feet bed level - declined to attempt EOB or OOB               General ADL Comments: Discussed safety strategies for LB ADLs (performing while seated). Collab about tub transfers though pt declined to attempt, citing "if I cant get in there, I wont get in there" and plans to sponge bathe as needed. Discussed IADLs and home setup - pt does report his wife has 2 dogs so educated and encouraged to be sure they do not increase his fall risk     Vision   Vision Assessment?: No apparent visual deficits   Perception     Praxis      Cognition Arousal/Alertness: Awake/alert Behavior During Therapy: WFL for tasks assessed/performed Overall Cognitive Status: Within Functional Limits for tasks assessed  General Comments: WFL cognitively though resistant to OOB activities citing ability to do everything already        Exercises Exercises: General Lower Extremity General Exercises - Lower Extremity Ankle Circles/Pumps: AROM;Both;10 reps;Seated Long Arc Quad: AROM;Both;10 reps;Seated   Shoulder Instructions       General Comments VSS on RA    Pertinent Vitals/ Pain       Pain Assessment: Faces Faces Pain Scale: Hurts a little bit Pain Location: R leg/incisional site Pain Descriptors / Indicators: Burning;Sore Pain Intervention(s): Monitored during session  Home Living                                          Prior Functioning/Environment               Frequency  Min 2X/week        Progress Toward Goals  OT Goals(current goals can now be found in the care plan section)  Progress towards OT goals: Progressing toward goals  Acute Rehab OT Goals Patient Stated Goal: Go home by Sunday OT Goal Formulation: With patient Time For Goal Achievement: 03/27/21 Potential to Achieve Goals: Fair ADL Goals Pt Will Perform Lower Body Bathing: with set-up;sit to/from stand Pt Will Perform Lower Body Dressing: with set-up;sit to/from stand Pt Will Transfer to Toilet: Independently;ambulating;regular height toilet  Plan Discharge plan remains appropriate    Co-evaluation                 AM-PAC OT "6 Clicks" Daily Activity     Outcome Measure   Help from another person eating meals?: None Help from another person taking care of personal grooming?: None Help from another person toileting, which includes using toliet, bedpan, or urinal?: A Little Help from another person bathing (including washing, rinsing, drying)?: A Little Help from another person to put on and taking off regular upper body clothing?: None Help from another person to put on and taking off regular lower body clothing?: A Little 6 Click Score: 21    End of Session    OT Visit Diagnosis: Pain Pain - Right/Left: Right Pain - part of body: Leg   Activity Tolerance Other (comment) (self limiting)   Patient Left in bed;with call bell/phone within reach   Nurse Communication          Time: [id number] OT Time Calculation (min): 13 min  Charges: OT General Charges $OT Visit: 1 Visit OT Treatments $Self Care/Home Management : 8-22 mins  Malachy Chamber, OTR/L Acute Rehab Services Office: [phone]   Layla Maw 03/15/2021, 10:46 AM

## 2021-03-15 NOTE — Progress Notes (Addendum)
SITE AREA: left groin/femoral  SITE PRIOR TO REMOVAL:  LEVEL 0  PRESSURE APPLIED FOR: approximately 20 minutes  MANUAL: yes  PATIENT STATUS DURING PULL: stable  POST PULL SITE:  LEVEL 0  POST PULL INSTRUCTIONS GIVEN: yes  POST PULL PULSES PRESENT:  right post tibial, left post tibial, and left pedal pulses are dopplerable  DRESSING APPLIED: gauze with tegaderm  BEDREST BEGINS @ 1729  COMMENTS:

## 2021-03-15 NOTE — Progress Notes (Addendum)
   VASCULAR SURGERY ASSESSMENT & PLAN:   POD 3 right iliofemoral endarterectomy with vein patch, right femoral to above-knee popliteal artery bypass with PTFE graft.  Right common and external iliac artery stent placement.  His right PT Doppler signal is improved.  He has intact motor function and sensation.  Calf soft. Heparin infusion initiated on Saturday. On aspirin, statin and Plavix.   Acute BLA: he received 2 units of FFP and 4 units of packed red blood cells perioperatively.  Hemoglobin 9.7 this morning>>stable    Thrombocytopenia: platelet count improved last 24 hours. Continue heparin and monitor.   Dispo: home with HHPT    -DVT prophylaxis: Heparin infusion    SUBJECTIVE:   Complains of right anterior lower leg pain. Voiding and tolerating diet.  PHYSICAL EXAM:   Vitals:   03/14/21 1351 03/14/21 2026 03/14/21 2305 03/15/21 0310  BP: 115/77 112/68 106/66 115/71  Pulse: 83 74 78 75  Resp: 18 20 16 16   Temp: 98.6 F (37 C) 99 F (37.2 C) 99 F (37.2 C) 98.5 F (36.9 C)  TempSrc: Oral Oral Oral Oral  SpO2: 94% 92% 90% 98%   General appearance: Awake, alert in no apparent distress Cardiac: Heart rate and rhythm are regular Respirations: Nonlabored Incisions: Right groin, thigh incisions are all well approximated without bleeding or hematoma Extremities: Both feet are warm with intact sensation and motor function.  Partial thickness skin loss of 3rd toe>>no signs of acute ischemia or infection Pulse/Doppler exam:  Brisk right dorsalis pedis, posterior tibial and faint peroneal artery Doppler signals   LABS:   Lab Results  Component Value Date   WBC 14.3 (H) 03/15/2021   HGB 9.7 (L) 03/15/2021   HCT 28.5 (L) 03/15/2021   MCV 88.0 03/15/2021   PLT 109 (L) 03/15/2021   Lab Results  Component Value Date   CREATININE 1.08 03/13/2021   Lab Results  Component Value Date   INR 1.0 03/11/2021   CBG (last 3)  Recent Labs    03/12/21 1512  GLUCAP 132*     PROBLEM LIST:    Active Problems:   PAD (peripheral artery disease) (HCC)   CURRENT MEDS:    sodium chloride   Intravenous Once   amLODipine  5 mg Oral Q breakfast   aspirin EC  81 mg Oral Q0600   Chlorhexidine Gluconate Cloth  6 each Topical Daily   clopidogrel  75 mg Oral Q supper   docusate sodium  100 mg Oral Daily   metoprolol tartrate  50 mg Oral TID   And   hydrochlorothiazide  25 mg Oral TID   pantoprazole  40 mg Oral Daily   rosuvastatin  10 mg Oral Daily   Barbie Banner, PA-C  Office: [phone] 03/15/2021   I agree with the above.  Will plan for angio today to make sure the right iliac stent is widely patent, and get better imagews of the left leg.  He is in agreement.  Annamarie Major

## 2021-03-15 NOTE — Progress Notes (Signed)
ANTICOAGULATION CONSULT NOTE  Pharmacy Consult for heparin Indication:  PAD, S/p right fem pop bypass with PTFE graft  No Known Allergies  Patient Measurements:   Heparin Dosing Weight: 55 kg  Vital Signs: Temp: 98.5 F (36.9 C) (09/19 0811) Temp Source: Oral (09/19 0811) BP: 114/72 (09/19 0811) Pulse Rate: 86 (09/19 0811)  Labs: Recent Labs    03/13/21 0149 03/13/21 2135 03/14/21 0131 03/14/21 [id number] 03/14/21 1627 03/15/21 0342 03/15/21 0957  HGB 9.6*  --  9.6*  --   --  9.7*  --   HCT 28.6*  --  28.5*  --   --  28.5*  --   PLT 89*  --  91*  --   --  109*  --   HEPARINUNFRC  --    < >  --    < > 0.24* 0.65 0.57  CREATININE 1.08  --   --   --   --   --   --    < > = values in this interval not displayed.     Estimated Creatinine Clearance: 49.3 mL/min (by C-G formula based on SCr of 1.08 mg/dL).    Infusions:   sodium chloride     sodium chloride 100 mL/hr at 03/12/21 2105   sodium chloride 100 mL/hr at 03/15/21 1027   heparin 1,200 Units/hr (03/15/21 0300)   magnesium sulfate bolus IVPB      Assessment: Patient is s/p right iliofemoral endarterectomy with vein patch, right femoral to above-knee popliteal artery bypass with PTFE graft. Pharmacy is consulted to dose heparin therapy.   Heparin level this morning is within goal range at 0.57.  CBC stable.  Planning re-look angio later today.  Goal of Therapy:  Heparin level 0.3-0.7 units/ml Monitor platelets by anticoagulation protocol: Yes   Plan:  -Continue IV heparin at current rate. -F/u plans for heparin after procedure today. -Daily heparin level and CBC.  [place] Crane, Roylene Reason, BCCP Clinical Pharmacist  03/15/2021 12:54 PM   [facility] [place] pharmacy phone numbers are listed on [email or web address]

## 2021-03-15 NOTE — Progress Notes (Signed)
ANTICOAGULATION CONSULT NOTE - Follow Up Consult  Pharmacy Consult for heparin Indication:  PAD, s/p right fem pop bypass with PTFE graft  Labs: Recent Labs    03/13/21 0149 03/13/21 2135 03/14/21 0131 03/14/21 0613 03/14/21 1627 03/15/21 0342  HGB 9.6*  --  9.6*  --   --  9.7*  HCT 28.6*  --  28.5*  --   --  28.5*  PLT 89*  --  91*  --   --  109*  HEPARINUNFRC  --    < >  --  0.19* 0.24* 0.65  CREATININE 1.08  --   --   --   --   --    < > = values in this interval not displayed.    Assessment/Plan:  70yo male therapeutic on heparin after rate change. Will continue infusion at current rate of 1200 units/hr and confirm stable with additional level.   Wynona Neat, PharmD, BCPS  03/15/2021,4:29 AM

## 2021-03-15 NOTE — Op Note (Signed)
    Patient name: Todd [name] MRN: [id number] DOB: Aug 13, 1950 Sex: male  03/15/2021 Pre-operative Diagnosis: Decreased flow right lower extremity bypass graft Post-operative diagnosis:  Same Surgeon:  Erlene Quan C. Donzetta Matters, MD Procedure Performed: 1.  Ultrasound-guided cannulation left common femoral artery 2.  Aortogram with bilateral lower extremity angiography 3.  Stent of right external iliac artery with 7 x 59 mm VBX 4.  Moderate sedation with fentanyl and Versed for 40 minutes  Indications: 70 year old male underwent right iliofemoral endarterectomy with patch angioplasty and femoral to above-knee popliteal artery bypass with graft and stent of his right common and external iliac arteries with drug-eluting stents 3 days prior to this procedure.  He now has decreased flow to the right common femoral artery is indicated for angiography with possible intervention.  Findings: Aorta remains patent without flow-limiting stenosis.  The right common iliac artery has an area of haziness but does not appear to have any frank dissection does not appear to have any flow limitation.  In the distal area of the external leg artery stent there appears to either be thrombus or dissection which is inhibiting flow.  There is flow distal to this in the common femoral artery.  The left external neck artery stent is patent.  The common femoral artery is heavily diseased by both ultrasound and angiography.  The left SFA is quite diminutive with areas of at least 50% stenosis.  Below the knee the anterior tibial artery is diminutive.  The tibioperoneal trunk appears to have subtotal occlusion posterior tibial artery is likely the most dominant runoff vessel but is diseased proximally. The right side bypass is patent and runoff is dominant via the posterior tibial which does fill the foot.  After stenting of the external neck artery there is no further flow limitation there is no evidence of thrombus or  dissection.     Procedure:  The patient was identified in the holding area and taken to room 8.  The patient was then placed supine on the table and prepped and draped in the usual sterile fashion.  A time out was called.  Ultrasound was used to evaluate the left common femoral artery which was noted to be somewhat diseased in the area was anesthetized 1% lidocaine cannulated direct ultrasound visualization with micropuncture needle followed by wire and sheath.  Images saved in permanent record.  We placed a Bentson wire followed by 5 French sheath and pigtail catheter to the level of L1.  Aortogram was performed with the above findings we then heparinized the patient.  We crossed the bifurcation with crossover catheter and Glidewire advantage through the area of concern.  We then placed a long 7 French sheath.  We primarily stented the external leg artery with VBX.  Completion demonstrated no further disease.  We then performed completion angiography of the right lower extremity which demonstrated no distal thrombus and runoff is dominant via the posterior tibial artery.  We then retracted the sheath and remove the wire.  Sheath was placed in the left external leg artery and left lower extremity angiography was performed with the above findings.  Sheath we pulled in postoperative holding.  He tolerated procedure without any complication.  Contrast: 85 cc  Junie Engram C. Donzetta Matters, MD [facility] [place] Office: [phone] Pager: [phone]

## 2021-03-15 NOTE — Progress Notes (Signed)
ANTICOAGULATION CONSULT NOTE  Pharmacy Consult for heparin Indication:  PAD, S/p right fem pop bypass with PTFE graft  No Known Allergies  Patient Measurements:   Heparin Dosing Weight: 55 kg  Vital Signs: Temp: 98.5 F (36.9 C) (09/19 0811) Temp Source: Oral (09/19 0811) BP: 111/67 (09/19 1740) Pulse Rate: 89 (09/19 1740)  Labs: Recent Labs    03/13/21 0149 03/13/21 2135 03/14/21 0131 03/14/21 [id number] 03/14/21 1627 03/15/21 0342 03/15/21 0957  HGB 9.6*  --  9.6*  --   --  9.7*  --   HCT 28.6*  --  28.5*  --   --  28.5*  --   PLT 89*  --  91*  --   --  109*  --   HEPARINUNFRC  --    < >  --    < > 0.24* 0.65 0.57  CREATININE 1.08  --   --   --   --   --   --    < > = values in this interval not displayed.     Estimated Creatinine Clearance: 49.3 mL/min (by C-G formula based on SCr of 1.08 mg/dL).    Infusions:   sodium chloride     sodium chloride 100 mL/hr at 03/12/21 2105   sodium chloride     heparin     magnesium sulfate bolus IVPB      Assessment: Patient is s/p right iliofemoral endarterectomy with vein patch, right femoral to above-knee popliteal artery bypass with PTFE graft. Pharmacy is consulted to dose heparin therapy.   9/19 R iliofemoral endarterectomy and fem-pop artery bypass 9/19 '@1730'$  Sheath removal Previous Heparin level 0.57 on 1200 units/hr  Goal of Therapy:  Heparin level 0.3-0.7 units/ml Monitor platelets by anticoagulation protocol: Yes   Plan:  Plan to restart heparin 1200 units/hr 4hours post sheath removal (~2130) Obtain heparin level with am labs Daily heparin level and CBC ordered  Donnald Garre, PharmD Clinical Pharmacist  Please check AMION for all [place] numbers After 10:00 PM, call [place] [phone]

## 2021-03-15 NOTE — Progress Notes (Signed)
Pt recevied from cath lab. VSS. L groin level 0. Call light in reach.  Clyde Canterbury, RN

## 2021-03-15 NOTE — Progress Notes (Signed)
Physical Therapy Treatment Patient Details Name: Todd [name] MRN: [id number] DOB: 1951/04/19 Today's Date: 03/15/2021   History of Present Illness The pt is a 70 yo male presenting 9/16 for R femoral-popliteal artery bypass grafting due to PAD and pain at rest in R foot. PMH includes: bilateral lower extremity arterial insufficiency, aortogram via left femoral approach and bilateral external iliac intervention, alcoholism, a fib, HTN, gout, and osteoarthritis.    PT Comments    The pt was seen for continued progression of OOB mobility this morning, and was able to demo good progress with standing tolerance and ambulation distance. The pt was able to complete multiple short bouts of ambulation in the room with minG/supervision for safety, and benefits from cues for upright posture and reduced trunk flexion. The pt remains most limited by pain in RLE, and required seated rest to recover between each short bout of activity. Will continue to benefit from skilled PT acutely, current recommendations remain appropriate.     Recommendations for follow up therapy are one component of a multi-disciplinary discharge planning process, led by the attending physician.  Recommendations may be updated based on patient status, additional functional criteria and insurance authorization.  Follow Up Recommendations  Home health PT;Supervision for mobility/OOB     Equipment Recommendations  None recommended by PT    Recommendations for Other Services       Precautions / Restrictions Precautions Precautions: Fall Restrictions Weight Bearing Restrictions: No     Mobility  Bed Mobility Overal bed mobility: Needs Assistance Bed Mobility: Supine to Sit     Supine to sit: Supervision     General bed mobility comments: HOB elevated, pt able to complete with slightly increased line, assist for line management    Transfers Overall transfer level: Needs assistance Equipment used:  None Transfers: Sit to/from Stand Sit to Stand: Supervision         General transfer comment: pt slow to rise, but able to power up and steady without assist  Ambulation/Gait Ambulation/Gait assistance: Supervision Gait Distance (Feet): 5 Feet (+65f + 20 ft) Assistive device: None Gait Pattern/deviations: Step-through pattern;Decreased stance time - right;Trunk flexed Gait velocity: decreased   General Gait Details: pt with head and trunk flexed, antalgic pattern with decrased wt to RLE due to pain.      Balance Overall balance assessment: Mild deficits observed, not formally tested                                          Cognition Arousal/Alertness: Awake/alert Behavior During Therapy: WFL for tasks assessed/performed Overall Cognitive Status: Within Functional Limits for tasks assessed                                 General Comments: pt agreeable to mobility and recommendations, good safety awareness      Exercises General Exercises - Lower Extremity Ankle Circles/Pumps: AROM;Both;10 reps;Seated Long Arc Quad: AROM;Both;10 reps;Seated    General Comments General comments (skin integrity, edema, etc.): VSS on RA, HR to 97bpm with standing activities      Pertinent Vitals/Pain Pain Assessment: Faces Faces Pain Scale: Hurts even more Pain Location: R leg/incisional site Pain Descriptors / Indicators: Grimacing;Guarding;Sharp;Burning;Sore Pain Intervention(s): Limited activity within patient's tolerance;Monitored during session;Repositioned     PT Goals (current goals can now be found in the care  plan section) Acute Rehab PT Goals Patient Stated Goal: Go home by Sunday PT Goal Formulation: With patient Time For Goal Achievement: 03/27/21 Potential to Achieve Goals: Good Progress towards PT goals: Progressing toward goals    Frequency    Min 3X/week      PT Plan Current plan remains appropriate       AM-PAC PT "6  Clicks" Mobility   Outcome Measure  Help needed turning from your back to your side while in a flat bed without using bedrails?: A Little Help needed moving from lying on your back to sitting on the side of a flat bed without using bedrails?: A Little Help needed moving to and from a bed to a chair (including a wheelchair)?: A Little Help needed standing up from a chair using your arms (e.g., wheelchair or bedside chair)?: A Little Help needed to walk in hospital room?: A Little Help needed climbing 3-5 steps with a railing? : A Little 6 Click Score: 18    End of Session Equipment Utilized During Treatment: Gait belt Activity Tolerance: Patient tolerated treatment well;Patient limited by fatigue;Patient limited by pain Patient left: with call bell/phone within reach;in bed Nurse Communication: Mobility status PT Visit Diagnosis: Other abnormalities of gait and mobility (R26.89);Pain Pain - Right/Left: Right Pain - part of body: Leg     Time: [id number] PT Time Calculation (min) (ACUTE ONLY): 27 min  Charges:  $Gait Training: 8-22 mins $Therapeutic Activity: 8-22 mins                     [place] Carbo, PT, DPT   Acute Rehabilitation Department Pager #: [phone]   Sandra Cockayne 03/15/2021, 9:43 AM

## 2021-03-16 LAB — TYPE AND SCREEN
ABO/RH(D): O NEG
Antibody Screen: NEGATIVE
Unit division: 0
Unit division: 0
Unit division: 0
Unit division: 0
Unit division: 0
Unit division: 0

## 2021-03-16 LAB — BASIC METABOLIC PANEL
Anion gap: 8 (ref 5–15)
BUN: 6 mg/dL — ABNORMAL LOW (ref 8–23)
CO2: 27 mmol/L (ref 22–32)
Calcium: 7.5 mg/dL — ABNORMAL LOW (ref 8.9–10.3)
Chloride: 92 mmol/L — ABNORMAL LOW (ref 98–111)
Creatinine, Ser: 0.93 mg/dL (ref 0.61–1.24)
GFR, Estimated: >60 mL/min (ref >60)
Glucose, Bld: 110 mg/dL — ABNORMAL HIGH (ref 70–99)
Potassium: 3.3 mmol/L — ABNORMAL LOW (ref 3.5–5.1)
Sodium: 127 mmol/L — ABNORMAL LOW (ref 135–145)

## 2021-03-16 LAB — BPAM RBC
Blood Product Expiration Date: 202209232359
Blood Product Expiration Date: 202209242359
Blood Product Expiration Date: 202209262359
Blood Product Expiration Date: 202210172359
Blood Product Expiration Date: 202210172359
Blood Product Expiration Date: 202210172359
ISSUE DATE / TIME: 202209161709
ISSUE DATE / TIME: 202209161709
ISSUE DATE / TIME: 202209161753
ISSUE DATE / TIME: 202209161753
ISSUE DATE / TIME: 202209161753
ISSUE DATE / TIME: 202209161753
Unit Type and Rh: 5100
Unit Type and Rh: 5100
Unit Type and Rh: 5100
Unit Type and Rh: 5100
Unit Type and Rh: 9500
Unit Type and Rh: 9500

## 2021-03-16 LAB — CBC
HCT: 24.2 % — ABNORMAL LOW (ref 39.0–52.0)
Hemoglobin: 8.3 g/dL — ABNORMAL LOW (ref 13.0–17.0)
MCH: 29.5 pg (ref 26.0–34.0)
MCHC: 34.3 g/dL (ref 30.0–36.0)
MCV: 86.1 fL (ref 80.0–100.0)
Platelets: 122 10*3/uL — ABNORMAL LOW (ref 150–400)
RBC: 2.81 MIL/uL — ABNORMAL LOW (ref 4.22–5.81)
RDW: 15.8 % — ABNORMAL HIGH (ref 11.5–15.5)
WBC: 14.5 10*3/uL — ABNORMAL HIGH (ref 4.0–10.5)
nRBC: 0 % (ref 0.0–0.2)

## 2021-03-16 LAB — HEPARIN LEVEL (UNFRACTIONATED): Heparin Unfractionated: 0.3 IU/mL (ref 0.30–0.70)

## 2021-03-16 MED ORDER — ASPIRIN 81 MG PO TBEC: 81.0000 mg | DELAYED_RELEASE_TABLET | Freq: Every day | ORAL | 11 refills | Status: DC

## 2021-03-16 MED ORDER — POTASSIUM CHLORIDE CRYS ER 20 MEQ PO TBCR
40.0000 meq | EXTENDED_RELEASE_TABLET | Freq: Once | ORAL | Status: AC
  Administered 2021-03-16: 40 meq via ORAL
  Filled 2021-03-16: qty 2

## 2021-03-16 MED ORDER — HYDROCODONE-ACETAMINOPHEN 5-325 MG PO TABS: 1.0000 | ORAL_TABLET | ORAL | 0 refills | Status: DC | PRN

## 2021-03-16 NOTE — Progress Notes (Signed)
Mobility Specialist Progress Note    03/16/21 1243  Mobility  Activity Ambulated in room  Level of Assistance Independent  Assistive Device None  Distance Ambulated (ft) 104 ft (52+20+32)  Mobility Ambulated independently in room  Mobility Response Tolerated well  Mobility performed by Mobility specialist  $Mobility charge 1 Mobility   Pt found in bed. Pt asx and walked 2 laps from chair to door and back. Stopped on second lap to brush teeth and wash face. Pt returned to bed and noted feeling soreness but no pain. Left with call bell in reach and wife present in room.   Pre-Mobility:110/71 BP Post-Mobility: 96/66 BP  Astron[facility] Phone: [phone]

## 2021-03-16 NOTE — Discharge Instructions (Signed)
 [facility] [place]  Discharge instructions  Lower Extremity Bypass Surgery  Please refer to the following instruction for your post-procedure care. Your surgeon or physician assistant will discuss any changes with you.  Activity  You are encouraged to walk as much as you can. You can slowly return to normal activities during the month after your surgery. Avoid strenuous activity and heavy lifting until your doctor tells you it's OK. Avoid activities such as vacuuming or swinging a golf club. Do not drive until your doctor give the OK and you are no longer taking prescription pain medications. It is also normal to have difficulty with sleep habits, eating and bowel movement after surgery. These will go away with time.  Bathing/Showering  You may shower after you go home. Do not soak in a bathtub, hot tub, or swim until the incision heals completely.  Incision Care  Clean your incision with mild soap and water. Shower every day. Pat the area dry with a clean towel. You do not need a bandage unless otherwise instructed. Do not apply any ointments or creams to your incision. If you have open wounds you will be instructed how to care for them or a visiting nurse may be arranged for you. If you have staples or sutures along your incision they will be removed at your post-op appointment. You may have skin glue on your incision. Do not peel it off. It will come off on its own in about one week. If you have a great deal of moisture in your groin, use a gauze help keep this area dry.  Diet  Resume your normal diet. There are no special food restrictions following this procedure. A low fat/ low cholesterol diet is recommended for all patients with vascular disease. In order to heal from your surgery, it is CRITICAL to get adequate nutrition. Your body requires vitamins, minerals, and protein. Vegetables are the best source of vitamins and minerals. Vegetables also provide the  perfect balance of protein. Processed food has little nutritional value, so try to avoid this.  Medications  Resume taking all your medications unless your doctor or nurse practitioner tells you not to. If your incision is causing pain, you may take over-the-counter pain relievers such as acetaminophen (Tylenol). If you were prescribed a stronger pain medication, please aware these medication can cause nausea and constipation. Prevent nausea by taking the medication with a snack or meal. Avoid constipation by drinking plenty of fluids and eating foods with high amount of fiber, such as fruits, vegetables, and grains. Take Colase 100 mg (an over-the-counter stool softener) twice a day as needed for constipation. Do not take Tylenol if you are taking prescription pain medications.  Follow Up  Our office will schedule a follow up appointment 2-3 weeks following discharge.  Please call us immediately for any of the following conditions  Severe or worsening pain in your legs or feet while at rest or while walking Increase pain, redness, warmth, or drainage (pus) from your incision site(s) Fever of 101 degree or higher The swelling in your leg with the bypass suddenly worsens and becomes more painful than when you were in the hospital If you have been instructed to feel your graft pulse then you should do so every day. If you can no longer feel this pulse, call the office immediately. Not all patients are given this instruction.  Leg swelling is common after leg bypass surgery.  The swelling should improve over a few months   following surgery. To improve the swelling, you may elevate your legs above the level of your heart while you are sitting or resting. Your surgeon or physician assistant may ask you to apply an ACE wrap or wear compression (TED) stockings to help to reduce swelling.  Reduce your risk of vascular disease  Stop smoking. If you would like help call [facility] at 1-800-QUIT-NOW  (1-800-784-8669) or [place] at 336-586-4000.  Manage your cholesterol Maintain a desired weight Control your diabetes weight Control your diabetes Keep your blood pressure down  If you have any questions, please call the office at [phone]   

## 2021-03-16 NOTE — TOC Transition Note (Signed)
Transition of Care (TOC) - CM/SW Discharge Note Marvetta Gibbons RN, BSN Transitions of Care Unit 4E- RN Case Manager See Treatment Team for direct phone #    Patient Details  Name: Todd [name] MRN: [id number] Date of Birth: 07-25-1950  Transition of [facility]) CM/SW Contact:  Dawayne Patricia, RN Phone Number: 03/16/2021, 4:57 PM   Clinical Narrative:    Pt stable for transition home today w/ wife. Order placed for HHPT,  CM in to speak with pt regarding [place] needs. Per pt he does not have any DME needs, discussed [place] services with pt- list provided for choice Per CMS guidelines from medicare.gov website with star ratings (copy placed in shadow chart) , pt voiced that he would like to use agency that his doctor likes- CM aware that vascular has a working relationship with [facility]- pt voiced that he is agreeable to see if [facility] can provide services as first choice- and if not then any agency that will work with his insurance.   Wife to transport home, at bedside.  Address, phone #-(prefers landline), and PCP all confirmed in epic.   Call made to [facility] with [facility] for HHPT referral- referral pending review and staffing confirmation- will f/u in am to confirm.     Final next level of care: [place] Barriers to Discharge: Barriers Resolved   Patient Goals and CMS Choice Patient states their goals for this hospitalization and ongoing recovery are:: return home CMS Medicare.gov Compare Post Acute Care list provided to:: Patient Choice offered to / list presented to : Patient, Spouse  Discharge Placement               Home w/ [facility]        Discharge Plan and Services In-house Referral: NA Discharge Planning Services: CM Consult Post Acute Care Choice: Home Health          DME Arranged: N/A DME Agency: NA       HH Arranged: PT HH Agency: [place] Date [place]: 03/16/21 Time [place]: [id number] Representative spoke  with at [place]: [place] ([place]) Interventions     Readmission Risk Interventions Readmission Risk Prevention Plan 03/16/2021  Post Dischage Appt Complete  Medication Screening Complete  Transportation Screening Complete  Some recent data might be hidden

## 2021-03-16 NOTE — Progress Notes (Signed)
D/C instructions given to patient. Medications and wound care reviewed. IV removed, clean and intact. Wife to escort pt home.  Clyde Canterbury, RN

## 2021-03-16 NOTE — Progress Notes (Addendum)
   VASCULAR SURGERY ASSESSMENT & PLAN:   POD 4 right iliofemoral endarterectomy with vein patch, right femoral to above-knee popliteal artery bypass with PTFE graft.    Prior right common and external iliac artery stent placement.    POD 1 right EIA VBX stent placement. His right PT Doppler signal is improved.  He has intact motor function and sensation.  Calf soft. Heparin infusion initiated on Saturday. On aspirin, statin and Plavix. His VVS and he is afebrile. Appears stable to discontinue heparin; possible DC home today versus additional 24 hours observation. Patient is anxious to get up and mobilize this morning. Will see how he does again this morning. Will DC telemetry.   Acute BLA: he received 2 units of FFP and 4 units of packed red blood cells perioperatively.  Hemoglobin 8.3 this morning.   Thrombocytopenia: platelet count continues to improve.   Dispo: home with HHPT    -DVT prophylaxis: Heparin infusion   SUBJECTIVE:   Feels well and ready to get OOB.  PHYSICAL EXAM:   Vitals:   03/15/21 2307 03/16/21 0000 03/16/21 0332 03/16/21 0704  BP: (!) 86/58 (!) 93/59 113/71 101/67  Pulse: 73 73 70 92  Resp: 18 16 17 16   Temp: 99.5 F (37.5 C)  99.3 F (37.4 C) 98.7 F (37.1 C)  TempSrc: Oral  Oral Oral  SpO2: 93% 95% 96% 95%  Weight:    56.8 kg  Height:    5\' 8"  (1.727 m)   General appearance: Awake, alert in no apparent distress Cardiac: Heart rate and rhythm are regular Respirations: Nonlabored Incisions: Right groin, thigh incisions are all well approximated. Some fullness of medial knee and ecchymosis. This area is soft to palpation.  Extremities: Both feet are warm with intact sensation and motor function.  Partial thickness skin loss of 3rd toe>>no signs of acute ischemia or infection Left groin is soft without bleeding or hematoma. Pulse/Doppler exam:  Brisk right dorsalis pedis, posterior tibial and faint peroneal artery Doppler signals.Strength of PT signal  improved further today.  LABS:   Lab Results  Component Value Date   WBC 14.5 (H) 03/16/2021   HGB 8.3 (L) 03/16/2021   HCT 24.2 (L) 03/16/2021   MCV 86.1 03/16/2021   PLT 122 (L) 03/16/2021   Lab Results  Component Value Date   CREATININE 0.93 03/16/2021   Lab Results  Component Value Date   INR 1.0 03/11/2021   CBG (last 3)  No results for input(s): GLUCAP in the last 72 hours.  PROBLEM LIST:    Active Problems:   PAD (peripheral artery disease) (HCC)   CURRENT MEDS:    sodium chloride   Intravenous Once   amLODipine  5 mg Oral Q breakfast   aspirin EC  81 mg Oral Q0600   clopidogrel  75 mg Oral Q supper   docusate sodium  100 mg Oral Daily   hydrochlorothiazide  25 mg Oral Daily   And   metoprolol tartrate  50 mg Oral TID   pantoprazole  40 mg Oral Daily   rosuvastatin  10 mg Oral Daily   Barbie Banner, [place]  Office: [phone] 03/16/2021

## 2021-03-16 NOTE — Progress Notes (Signed)
Patient has been OOB and ambulating without excessive pain, near-syncope, or SOB. Discharge home.

## 2021-03-16 NOTE — Progress Notes (Signed)
PHARMACIST LIPID MONITORING   Todd [name] is a 70 y.o. male admitted on 03/12/2021 with PAD.  Pharmacy has been consulted to optimize lipid-lowering therapy with the indication of secondary prevention for clinical ASCVD.  Recent Labs:  Lipid Panel (last 6 months):   Lab Results  Component Value Date   CHOL 72 03/13/2021   TRIG 39 03/13/2021   HDL 27 (L) 03/13/2021   CHOLHDL 2.7 03/13/2021   VLDL 8 03/13/2021   LDLCALC 37 03/13/2021    Hepatic function panel (last 6 months):   Lab Results  Component Value Date   AST 20 03/11/2021   ALT 11 03/11/2021   ALKPHOS 73 03/11/2021   BILITOT 0.7 03/11/2021    SCr (since admission):   Serum creatinine: 0.93 mg/dL 03/16/21 0129 Estimated creatinine clearance: 59.4 mL/min  Current therapy and lipid therapy tolerance Current lipid-lowering therapy: crestor 10 mg Previous lipid-lowering therapies (if applicable): same as above Documented or reported allergies or intolerances to lipid-lowering therapies (if applicable): none  Assessment:   Patient prefers no changes in lipid-lowering therapy at this time due to LDL being at goal  Plan:    1.Statin intensity (high intensity recommended for all patients regardless of the LDL):  No changes to statin at this time with LDL 37.  2.Add ezetimibe (if any one of the following):   Not indicated at this time.  3.Refer to lipid clinic:   No  4.Follow-up with:  Primary care provider - Redmond School, MD  5.Follow-up labs after discharge:  No changes in lipid therapy, repeat a lipid panel in one year.      Antonietta Jewel, PharmD, [place] Clinical Pharmacist  Phone: [phone] 03/16/2021 9:20 AM  Please check AMION for all [place] phone numbers After 10:00 PM, call [place] [phone]

## 2021-03-16 NOTE — Progress Notes (Signed)
ANTICOAGULATION CONSULT NOTE  Pharmacy Consult for heparin Indication:  PAD, S/p right fem pop bypass with PTFE graft  No Known Allergies  Patient Measurements: Height: 5\' 7"  (170.2 cm) Weight: 56.8 kg (125 lb 3 oz) IBW/kg (Calculated) : 66.1 Heparin Dosing Weight: 55 kg  Vital Signs: Temp: 99.3 F (37.4 C) (09/20 0332) Temp Source: Oral (09/20 0332) BP: 113/71 (09/20 0332) Pulse Rate: 70 (09/20 0332)  Labs: Recent Labs    03/14/21 0131 03/14/21 2641 03/15/21 0342 03/15/21 0957 03/16/21 0129  HGB 9.6*  --  9.7*  --  8.3*  HCT 28.5*  --  28.5*  --  24.2*  PLT 91*  --  109*  --  122*  HEPARINUNFRC  --    < > 0.65 0.57 0.30  CREATININE  --   --   --   --  0.93   < > = values in this interval not displayed.     Estimated Creatinine Clearance: 59.4 mL/min (by C-G formula based on SCr of 0.93 mg/dL).    Infusions:   sodium chloride     sodium chloride Stopped (03/14/21 2152)   sodium chloride     heparin 1,200 Units/hr (03/15/21 2126)   magnesium sulfate bolus IVPB      Assessment: Patient is s/p right iliofemoral endarterectomy with vein patch, right femoral to above-knee popliteal artery bypass with PTFE graft. Pharmacy is consulted to dose heparin therapy.   Heparin level is therapeutic at 0.3, on 1200 units/hr (~4 hr level from restart). Hgb 8.3, plt 122. No s/sx of bleeding or infusion issues.   Goal of Therapy:  Heparin level 0.3-0.7 units/ml Monitor platelets by anticoagulation protocol: Yes   Plan:  -Continue IV heparin at current rate of 1200 units/hr. -Daily heparin level and CBC.  Antonietta Jewel, PharmD, [place] Clinical Pharmacist  Phone: [phone] 03/16/2021 7:22 AM  Please check AMION for all [place] phone numbers After 10:00 PM, call [place] [phone]

## 2021-03-17 ENCOUNTER — Telehealth: Payer: Self-pay | Admitting: Cardiology

## 2021-03-17 ENCOUNTER — Telehealth: Payer: Self-pay

## 2021-03-17 DIAGNOSIS — T82398D Other mechanical complication of other vascular grafts, subsequent encounter: Secondary | ICD-10-CM | POA: Diagnosis not present

## 2021-03-17 DIAGNOSIS — I70201 Unspecified atherosclerosis of native arteries of extremities, right leg: Secondary | ICD-10-CM | POA: Diagnosis not present

## 2021-03-17 DIAGNOSIS — I4891 Unspecified atrial fibrillation: Secondary | ICD-10-CM | POA: Diagnosis not present

## 2021-03-17 DIAGNOSIS — F17210 Nicotine dependence, cigarettes, uncomplicated: Secondary | ICD-10-CM | POA: Diagnosis not present

## 2021-03-17 DIAGNOSIS — R2681 Unsteadiness on feet: Secondary | ICD-10-CM | POA: Diagnosis not present

## 2021-03-17 DIAGNOSIS — I10 Essential (primary) hypertension: Secondary | ICD-10-CM | POA: Diagnosis not present

## 2021-03-17 MED ORDER — AMLODIPINE BESYLATE 5 MG PO TABS: 2.5000 mg | ORAL_TABLET | Freq: Every day | ORAL | 3 refills | Status: DC

## 2021-03-17 NOTE — Telephone Encounter (Signed)
Pt/wife notified and verbalized understanding.   Medication list updated to reflect medications.  CBC ordered per Dr. Domenic Polite.  Pt had [facility] appt 10/10.

## 2021-03-17 NOTE — Telephone Encounter (Signed)
Satira Sark, MD    Thank you for reaching out.  I reviewed the chart, I do not see a formal discharge summary as yet so I am not certain about discharge medications or his follow-up plan.  He was sent home from the Vascular Surgery service.  I will forward my recommendations to Dr. Donnetta Hutching in case he has more information.  My suggestion would be to hold HCTZ and reduce Norvasc to 2.5 mg daily, continuing to track blood pressure.  He should have a follow-up CBC within a week of his discharge, this may have already been set up, but I am not certain.  He also needs to have close follow-up with the vascular team.  He did receive PRBCs close to discharge so hopefully hemoglobin will increase.  The elevated WBC count from 12-14 was fairly stable and could have been more of a stress reaction than necessarily infection.

## 2021-03-17 NOTE — Telephone Encounter (Signed)
From pt's wife:  Several issues occurred shortly before discharge from the original bypass procedure done on 9/16, & the angio on 9/19.  During surgery he lost about 1500 mls of blood & was given blood transfusions.His hct, rbc, hgb & platelets were low afterwards but stable. Bp was stable for several days also. After the angio on 9/19, cbc done at 1:30am his hct, hgb & rbc's dropped.Drops in bmp bloodwork also. He was on 1200units of heparin until around 3pm yesterday.  His BP was taken about 3:45pm yesterday & was 80/58, student nurse got the nurse & a few people came in & retook it & it was about  90/60. No vital signs were done at discharge around 5:15pm . I was advised to call you today if his BP was under 100 even though they discharged him with it under 100.  This morning at 7:30 his BP was 109/63. Do any adjustments need to be made to his amlodipine? Should I be concerned about the drops in his rbc, hgb,hct , or the increase in wbc's in cbc at 1:30am 9/19? No more bloodwork was done & he was discharged. He hasn't had a bowel movement since before going to the hospital 9/16. Should he remain on the same dosage of amlodipine and Is there anything we should be concerned or watching for with possible infection due to the wbc count or with the drops early yesterday morning in cbc since they occurred  just hrs after the 9/19 angio? He did get a little dizzy when walking 12-15 feet to the bathroom and back just now around 11:30am. Pt is coming soon. We are just nervous & concerned.

## 2021-03-17 NOTE — Telephone Encounter (Signed)
Please give Charisse from [facility] a call concerning the pt's medications and his current BP readings.   [phone]  I informed Severiano Gilbert that the pt had sent a Mychart Message to Dr. Domenic Polite concerning this and that Dr. Domenic Polite did respond back to the patient with recommendations.

## 2021-03-18 NOTE — Telephone Encounter (Signed)
Spoke to [place] who verbalized understanding of Dr. Domenic Polite' s orders. She had no further questions.

## 2021-03-19 ENCOUNTER — Other Ambulatory Visit: Payer: Self-pay

## 2021-03-19 ENCOUNTER — Telehealth: Payer: Self-pay

## 2021-03-19 ENCOUNTER — Other Ambulatory Visit (HOSPITAL_COMMUNITY)
Admission: RE | Admit: 2021-03-19 | Discharge: 2021-03-19 | Disposition: A | Discharge status: Home / Self Care | Payer: PPO | Source: Ambulatory Visit | Attending: Cardiology | Admitting: Cardiology

## 2021-03-19 DIAGNOSIS — I4891 Unspecified atrial fibrillation: Secondary | ICD-10-CM | POA: Insufficient documentation

## 2021-03-19 LAB — CBC
HCT: 23.4 % — ABNORMAL LOW (ref 39.0–52.0)
Hemoglobin: 7.6 g/dL — ABNORMAL LOW (ref 13.0–17.0)
MCH: 29.3 pg (ref 26.0–34.0)
MCHC: 32.5 g/dL (ref 30.0–36.0)
MCV: 90.3 fL (ref 80.0–100.0)
Platelets: 293 10*3/uL (ref 150–400)
RBC: 2.59 MIL/uL — ABNORMAL LOW (ref 4.22–5.81)
RDW: 15.9 % — ABNORMAL HIGH (ref 11.5–15.5)
WBC: 11.2 10*3/uL — ABNORMAL HIGH (ref 4.0–10.5)
nRBC: 0 % (ref 0.0–0.2)

## 2021-03-19 NOTE — Telephone Encounter (Signed)
Todd [name] informed of cbc results. I forwarded results to A.Alcon, RN at [facility]. Dr.Early also received results. Follow up apt is already scheduled with [facility] on 04/05/21.PCP copied

## 2021-03-19 NOTE — Telephone Encounter (Signed)
Patients wife calls today to ask what to do about patient's low hgb. I called back - upon talking to her, there is swelling in the right leg which I advised her was normal after a fem pop, and some firm areas in the groin and buttcheek but they were getting softer. He is also c/o "muscle tingling". Says it does really hurt bad and sounds like it is in the thigh area. Denies any foot pain or hematoma. Discussed with PA and placed patient on the schedule for a check on Wednesday. Advised to report to the ED if symptoms got concerning over the weekend, and to call sooner if patient should develop s/s of infection. Suggested if he had continued symptoms of low HgB such as SOB and weakness - should consult urgent care or PCP. She verbalizes understanding.

## 2021-03-19 NOTE — Telephone Encounter (Signed)
-----   Message from Satira Sark, MD sent at 03/19/2021  9:39 AM EDT ----- Results reviewed.  Hemoglobin is 7.6, previously 8.3 on September 20 during hospital evaluation.  Recently underwent vascular surgery and discharged from the Vascular service.  Please reach out to the [facility] team so that they are aware of these results and can have a scheduled follow-up for him.  I will forward this to Dr. Donnetta Hutching.

## 2021-03-22 ENCOUNTER — Encounter: Payer: PPO | Admitting: Surgery

## 2021-03-22 ENCOUNTER — Encounter (HOSPITAL_COMMUNITY): Payer: PPO

## 2021-03-24 ENCOUNTER — Other Ambulatory Visit: Payer: Self-pay

## 2021-03-24 ENCOUNTER — Ambulatory Visit (INDEPENDENT_AMBULATORY_CARE_PROVIDER_SITE_OTHER): Payer: PPO | Admitting: Physician Assistant

## 2021-03-24 VITALS — BP 110/66 | HR 89 | Temp 98.6°F | Resp 20 | Ht 68.0 in | Wt 130.1 lb

## 2021-03-24 DIAGNOSIS — Z95828 Presence of other vascular implants and grafts: Secondary | ICD-10-CM

## 2021-03-24 DIAGNOSIS — I739 Peripheral vascular disease, unspecified: Secondary | ICD-10-CM

## 2021-03-24 MED ORDER — CEPHALEXIN 500 MG PO CAPS: 500.0000 mg | ORAL_CAPSULE | Freq: Four times a day (QID) | ORAL | 0 refills | Status: DC

## 2021-03-24 NOTE — Progress Notes (Addendum)
POST OPERATIVE OFFICE NOTE    CC:  F/u for surgery  HPI:  This is a 70 y.o. male who is s/p Right iliofemoral endarterectomy with vein patch angioplasty, right femoral to AK popliteal bypass with PTFE (ringed), harvest of GSV and right CIA and EIA stent on 03/12/2021 by Dr. Trula Slade and stent of the right EIA on 03/15/2021 by Dr. Donzetta Matters.  He did have ABLA and was transfused 2 units of FFP, 4 units PRBC's.    Pt returns today with complaints of some drainage from the distal incision that started yesterday as well as some pain lateral to the incision that radiated down his leg.  This happened a couple times yesterday and hasn't happened since.  He has had a low grade fever of 99.2.  He is afebrile today.  He does not have any pain in his right foot.  His toe wound is healing.  His wife had concerns about his hgb, which was 7.6 on 03/19/2021.  His blood pressure has been in the 100's-110's.  He has felt a little lightheaded at times but he states this began when he was started on BP medication.  He denies any blood in his urine or stool.  He does have some mild swelling in the right leg.    No Known Allergies  Current Outpatient Medications  Medication Sig Dispense Refill   amLODipine (NORVASC) 5 MG tablet Take 0.5 tablets (2.5 mg total) by mouth daily. 45 tablet 3   aspirin EC 81 MG EC tablet Take 1 tablet (81 mg total) by mouth daily at 6 (six) AM. Swallow whole. 30 tablet 11   clopidogrel (PLAVIX) 75 MG tablet TAKE ONE TABLET BY MOUTH ONCE DAILY. (Patient taking differently: Take 75 mg by mouth daily with supper.) 90 tablet 2   HYDROcodone-acetaminophen (NORCO/VICODIN) 5-325 MG tablet Take 1 tablet by mouth every 4 (four) hours as needed for moderate pain. 10 tablet 0   metoprolol-hydrochlorothiazide (LOPRESSOR HCT) 50-25 MG tablet Take 1 tablet by mouth 3 (three) times daily.     Omega-3 Fatty Acids (FISH OIL) 1000 MG CAPS Take 1,000 mg by mouth daily.      rosuvastatin (CRESTOR) 10 MG tablet TAKE  (1) TABLET BY MOUTH ONCE DAILY. 30 tablet 0   No current facility-administered medications for this visit.     ROS:  See HPI  Physical Exam:  Today's Vitals   03/24/21 0914  BP: 110/66  Pulse: 89  Resp: 20  Temp: 98.6 F (37 C)  TempSrc: Temporal  SpO2: 96%  Weight: 130 lb 1.6 oz (59 kg)  Height: 5\' 8"  (1.727 m)  PainSc: 7    Body mass index is 19.78 kg/m.   Incision:  right groin incision is healing nicely.  There is some serous drainage from the distal incision above the knee.  Right groin is soft without hematoma. There is hematoma around proximal vein harvest incision.   Extremities:  brisk right PT doppler signal and peroneal doppler signal and monophasic AT doppler signal.  Right foot is warm with motor/sensory in tact.      Assessment/Plan:  This is a 70 y.o. male who is s/p: Right iliofemoral endarterectomy with vein patch angioplasty, right femoral to AK popliteal bypass with PTFE (ringed), harvest of GSV and right CIA and EIA stent on 03/12/2021 by Dr. Trula Slade and stent of the right EIA on 03/15/2021 by Dr. Donzetta Matters. He did have ABLA and was transfused 2 units of FFP, 4 units PRBC's.     -  pt has patent bypass with + doppler signals right foot.  Per pt's wife, toe wound is getting better. -pt did start having some serous drainage from the distal incision that started yesterday.  At this point, there is no evidence of infection.  Given this drainage and pt has PTFE for bypass, will start Keflex 500mg  bid x 10 days (sent electronically to [facility]).   -pt with hgb of 7.6 on labs on Friday.  His BP and HR has been stable as it was in the hospital.  Discussed with pt and wife to keep a log of his blood pressure and if his heart rate is in the 100's or his BP starts dropping to contact his PCP.  Do not feel he has any active bleeding.  The does have hematoma at proximal vein harvest site.  Discussed with pt that this could take weeks to resolve.   - Discussed with pt  if he develops drainage that is purulent or he runs fever greater than 100.5, or he has increased erythema, he should call us back.  We will schedule him to come in next Monday either on the PA or MD schedule when Dr. Trula Slade is here. -continue plavix/asa/statin   Leontine Locket, [facility] [phone]   Clinic MD:  Donzetta Matters

## 2021-03-29 ENCOUNTER — Ambulatory Visit (INDEPENDENT_AMBULATORY_CARE_PROVIDER_SITE_OTHER): Payer: PPO | Admitting: Physician Assistant

## 2021-03-29 ENCOUNTER — Other Ambulatory Visit: Payer: Self-pay

## 2021-03-29 ENCOUNTER — Ambulatory Visit: Payer: Self-pay

## 2021-03-29 VITALS — BP 107/65 | HR 61 | Temp 97.6°F | Resp 18 | Ht 68.0 in | Wt 135.0 lb

## 2021-03-29 DIAGNOSIS — I739 Peripheral vascular disease, unspecified: Secondary | ICD-10-CM

## 2021-03-29 MED ORDER — SULFAMETHOXAZOLE-TRIMETHOPRIM 800-160 MG PO TABS: 1.0000 | ORAL_TABLET | Freq: Two times a day (BID) | ORAL | 0 refills | Status: AC

## 2021-03-29 NOTE — Discharge Summary (Signed)
Bypass Discharge Summary Patient ID: Todd [name] [id number] 70 y.o. Jul 19, 1950  Admit date: 03/12/2021  Discharge date and time: 03/16/2021  5:35 PM   Admitting Physician: Serafina Mitchell, MD   Discharge Physician: Serafina Mitchell, MD  Admission Diagnoses: PAD (peripheral artery disease) [facility]) [I73.9]  Discharge Diagnoses: acute blood loss anemia; thrombocytopenia PAD (peripheral artery disease) ([place]) [I73.9]  Admission Condition: good  Discharged Condition: good  Indication for Admission: Right leg rest pain  Hospital Course: On the day of admission the patient was taken to the operating room and underwent:  1: Right iliofemoral endarterectomy with vein patch angioplasty                       #2: Right femoral to above-knee popliteal artery bypass graft with 6 mm external ring propatent PTFE                       #3: Harvest of right great saphenous vein                       #4: Ultrasound-guided access, left femoral artery                       #5: Abdominal aortogram                       #6: Right lower extremity runoff                       #7: Right common and external iliac artery stent (7 x 80 Elluvia) He received PRBC and FFP perioperatively for acute blood loss anemia. He was noted to have decreased RLE Doppler signals and was placed on heparin infusion.  He was voiding and tolerating his diet. He has moderate post-op pain which improved over the next several day. His vital signs remained stable. His incisions were healing without signs of infection . On POD 3, he was taken to the [facility] lab and underwent right EIA VBX stent placement by Dr. Donzetta Matters. His blood counts improved. Heparin was discontinued and statin, aspirin and Plavix were continued. On POD 4 he was ready for discharge home with [facility] PT.  Consults: None  Treatments: surgery: bypass and stent placement   Disposition: Discharge disposition: 06-Home-Health Care Svc       - For VQI Registry use  ---  Post-op:  Wound infection: No  Graft infection: No  Transfusion: Yes  If yes, 4 units given New Arrhythmia: No Patency judged by: [ x] Dopper only, [ ]  Palpable graft pulse, [ ]  Palpable distal pulse, [ ]  ABI inc. > 0.15, [ ]  Duplex Discharge ABI: R , L  Discharge TBI: R , L  D/C Ambulatory Status: Ambulatory  Complications: MI: [ ]  No, [ ]  Troponin only, [ ]  EKG or Clinical CHF: No Resp failure: [ ]  none, [ ]  Pneumonia, [ ]  Ventilator Chg in renal function: [ ]  none, [ ]  Inc. Cr > 0.5, [ ]  Temp. Dialysis, [ ]  Permanent dialysis Stroke: [ ]  None, [ ]  Minor, [ ]  Major Return to OR: No  Reason for return to OR: [ ]  Bleeding, [ ]  Infection, [ ]  Thrombosis, [ ]  Revision  Discharge medications: Statin use:  Yes ASA use:  Yes Plavix use:  Yes Beta blocker use: Yes Coumadin use: No  for medical reason not indicated  Patient Instructions:  Allergies as of 03/16/2021   No Known Allergies      Medication List     STOP taking these medications    oxyCODONE-acetaminophen 10-325 MG tablet Commonly known as: Percocet       TAKE these medications    aspirin 81 MG EC tablet Take 1 tablet (81 mg total) by mouth daily at 6 (six) AM. Swallow whole.   clopidogrel 75 MG tablet Commonly known as: PLAVIX TAKE ONE TABLET BY MOUTH ONCE DAILY. What changed: when to take this   Fish Oil 1000 MG Caps Take 1,000 mg by mouth daily.   HYDROcodone-acetaminophen 5-325 MG tablet Commonly known as: NORCO/VICODIN Take 1 tablet by mouth every 4 (four) hours as needed for moderate pain.   metoprolol-hydrochlorothiazide 50-25 MG tablet Commonly known as: LOPRESSOR HCT Take 1 tablet by mouth 3 (three) times daily.   rosuvastatin 10 MG tablet Commonly known as: CRESTOR TAKE (1) TABLET BY MOUTH ONCE DAILY.       Activity: no lifting, driving, or strenuous exercise for 2 weeks Diet: cardiac diet Wound Care: keep wound clean and dry  Follow-up with Dr. Trula Slade in 2  weeks.  Signed: [place] Circle Dawanna Grauberger 03/29/2021 10:12 AM

## 2021-03-29 NOTE — Progress Notes (Signed)
POST OPERATIVE OFFICE NOTE    CC:  F/u for surgery  HPI:  This is a 70 y.o. male who is s/p Right iliofemoral endarterectomy with vein patch angioplasty, right femoral to AK popliteal bypass with PTFE (ringed), harvest of GSV and right CIA and EIA stent on 03/12/2021 by Dr. Trula Slade and stent of the right EIA on 03/15/2021 by Dr. Donzetta Matters.   He did have ABLA and was transfused 2 units of FFP, 4 units PRBC's.    He came in last week with c/o drainage from the distal incision as well as some pain lateral to the incision that radiated down his leg.  It happened a couple of times and did not happen again.  He was not having any pain in his foot and it was felt the wound on his toe was healing.    Pt returns today for follow up accompanied by his wife.  Pt states he has been doing well.  The drainage has stopped on the distal incision and there is more drainage on the middle thigh incision.  There is some mild drainage from the distal incision today.  He has not had any fevers.  He continues to have some right leg swelling.  He is elevating when he is able.  The wound on his left foot is stable.  He has been taking his Keflex.   No Known Allergies  Current Outpatient Medications  Medication Sig Dispense Refill   amLODipine (NORVASC) 5 MG tablet Take 0.5 tablets (2.5 mg total) by mouth daily. 45 tablet 3   aspirin EC 81 MG EC tablet Take 1 tablet (81 mg total) by mouth daily at 6 (six) AM. Swallow whole. 30 tablet 11   cephALEXin (KEFLEX) 500 MG capsule Take 1 capsule (500 mg total) by mouth 4 (four) times daily. 20 capsule 0   clopidogrel (PLAVIX) 75 MG tablet TAKE ONE TABLET BY MOUTH ONCE DAILY. (Patient taking differently: Take 75 mg by mouth daily with supper.) 90 tablet 2   HYDROcodone-acetaminophen (NORCO/VICODIN) 5-325 MG tablet Take 1 tablet by mouth every 4 (four) hours as needed for moderate pain. 10 tablet 0   Omega-3 Fatty Acids (FISH OIL) 1000 MG CAPS Take 1,000 mg by mouth daily.       rosuvastatin (CRESTOR) 10 MG tablet TAKE (1) TABLET BY MOUTH ONCE DAILY. 30 tablet 0   metoprolol-hydrochlorothiazide (LOPRESSOR HCT) 50-25 MG tablet Take 1 tablet by mouth 3 (three) times daily. (Patient not taking: Reported on 03/29/2021)     No current facility-administered medications for this visit.     ROS:  See HPI  Physical Exam:  Today's Vitals   03/29/21 1107  BP: 107/65  Pulse: 61  Resp: 18  Temp: 97.6 F (36.4 C)  TempSrc: Temporal  SpO2: 100%  Weight: 135 lb (61.2 kg)  Height: 5\' 8"  (1.727 m)   Body mass index is 20.53 kg/m.   Incision:  right groin healing nicely.    Thigh incisions:   Extremities:  brisk monophasic right PT/peroneal doppler signals.  Right 2nd toe wound slowly improving   Left foot:      Assessment/Plan:  This is a 70 y.o. male who is s/p: Right iliofemoral endarterectomy with vein patch angioplasty, right femoral to AK popliteal bypass with PTFE (ringed), harvest of GSV and right CIA and EIA stent on 03/12/2021 by Dr. Trula Slade and stent of the right EIA on 03/15/2021 by Dr. Donzetta Matters. He did have ABLA and was transfused 2 units of FFP, 4  units PRBC's.    -pt seen with Dr. Trula Slade.  Mid thigh incision I&D today.  No frank evidence of infection.  Will need wet to dry dressing changes bid.  His wife was taught how to do this today.  Will order [facility] RN to come check wounds.  He is going to be starting HHPT tomorrow. -abx changed to Bactrim DS bid x 14 days.  He will f/u with [place] in 2 weeks for wound check.  If his wounds worsen or he starts running fevers over 100.5, they  will call us to be seen sooner.  Pt and wife are in agreement with this plan.  -he was measured for 15-69mmHg knee high compression sock today and placed in this.  Discussed to put on in the morning and take off at night.  Elevate legs when not mobilizing.   Leontine Locket, [facility] [phone]   Clinic MD:  pt seen with Dr. Trula Slade

## 2021-03-30 DIAGNOSIS — F17210 Nicotine dependence, cigarettes, uncomplicated: Secondary | ICD-10-CM | POA: Diagnosis not present

## 2021-03-30 DIAGNOSIS — I4891 Unspecified atrial fibrillation: Secondary | ICD-10-CM | POA: Diagnosis not present

## 2021-03-30 DIAGNOSIS — T82398D Other mechanical complication of other vascular grafts, subsequent encounter: Secondary | ICD-10-CM | POA: Diagnosis not present

## 2021-03-30 DIAGNOSIS — I70201 Unspecified atherosclerosis of native arteries of extremities, right leg: Secondary | ICD-10-CM | POA: Diagnosis not present

## 2021-03-30 DIAGNOSIS — I10 Essential (primary) hypertension: Secondary | ICD-10-CM | POA: Diagnosis not present

## 2021-03-30 DIAGNOSIS — R2681 Unsteadiness on feet: Secondary | ICD-10-CM | POA: Diagnosis not present

## 2021-04-05 DIAGNOSIS — I10 Essential (primary) hypertension: Secondary | ICD-10-CM | POA: Diagnosis not present

## 2021-04-05 DIAGNOSIS — I4891 Unspecified atrial fibrillation: Secondary | ICD-10-CM | POA: Diagnosis not present

## 2021-04-05 DIAGNOSIS — E782 Mixed hyperlipidemia: Secondary | ICD-10-CM | POA: Diagnosis not present

## 2021-04-06 DIAGNOSIS — I4891 Unspecified atrial fibrillation: Secondary | ICD-10-CM | POA: Diagnosis not present

## 2021-04-06 DIAGNOSIS — I10 Essential (primary) hypertension: Secondary | ICD-10-CM | POA: Diagnosis not present

## 2021-04-06 DIAGNOSIS — M79606 Pain in leg, unspecified: Secondary | ICD-10-CM | POA: Diagnosis not present

## 2021-04-06 DIAGNOSIS — Z6820 Body mass index (BMI) 20.0-20.9, adult: Secondary | ICD-10-CM | POA: Diagnosis not present

## 2021-04-07 ENCOUNTER — Telehealth: Payer: Self-pay

## 2021-04-07 ENCOUNTER — Other Ambulatory Visit (HOSPITAL_COMMUNITY): Payer: Self-pay | Admitting: Internal Medicine

## 2021-04-07 ENCOUNTER — Other Ambulatory Visit: Payer: Self-pay | Admitting: Internal Medicine

## 2021-04-07 DIAGNOSIS — M79605 Pain in left leg: Secondary | ICD-10-CM

## 2021-04-07 DIAGNOSIS — M79604 Pain in right leg: Secondary | ICD-10-CM

## 2021-04-07 NOTE — Telephone Encounter (Signed)
Received call from [place], [facility] - stated that wounds on R LE were dehisced and had slough, wet to dry was done today but recommends - calcium alginate if debrided in office on Monday or Santyl ointment if not -for debridement. Wound orders were left as is and will communicate any changes after OV on Monday

## 2021-04-12 ENCOUNTER — Ambulatory Visit (INDEPENDENT_AMBULATORY_CARE_PROVIDER_SITE_OTHER): Payer: PPO | Admitting: Physician Assistant

## 2021-04-12 ENCOUNTER — Other Ambulatory Visit: Payer: Self-pay

## 2021-04-12 VITALS — BP 96/62 | HR 48 | Temp 97.2°F | Resp 16 | Ht 68.0 in | Wt 128.0 lb

## 2021-04-12 DIAGNOSIS — I739 Peripheral vascular disease, unspecified: Secondary | ICD-10-CM

## 2021-04-12 DIAGNOSIS — T8131XD Disruption of external operation (surgical) wound, not elsewhere classified, subsequent encounter: Secondary | ICD-10-CM

## 2021-04-12 DIAGNOSIS — Z95828 Presence of other vascular implants and grafts: Secondary | ICD-10-CM

## 2021-04-12 MED ORDER — COLLAGENASE 250 UNIT/GM EX OINT: 1.0000 "application " | TOPICAL_OINTMENT | Freq: Every day | CUTANEOUS | 1 refills | Status: DC

## 2021-04-12 NOTE — Progress Notes (Signed)
POST OPERATIVE OFFICE NOTE    CC:  F/u for surgery  HPI:  This is a 70 y.o. male who is s/p Right iliofemoral endarterectomy with vein patch angioplasty, right femoral to AK popliteal bypass with PTFE (ringed), harvest of GSV and right CIA and EIA stent on 03/12/2021 by Dr. Trula Slade and stent of the right EIA on 03/15/2021 by Dr. Donzetta Matters.  His last stent placement was due to decreased flow in his right lower extremity bypass graft.  He also has history of right second toe wound.  He had previously placed bilateral EIA stents placed by Dr. Oneida Alar in 2020.  He returns today for follow-up of RLE extremity incisions.  Mid thigh incision was was opened up at last office visit and packed with saline wet-to-dry dressing.  Home health RN was arranged for wound care.  Finished 2 weeks of Bactrim.  He denies fever or chills.  He states he is up and about and ambulating most of every day.  Denies claudication or rest pain.  He is compliant with statin, aspirin and Plavix. He continues to smoke.  No Known Allergies  Current Outpatient Medications  Medication Sig Dispense Refill   amLODipine (NORVASC) 5 MG tablet Take 0.5 tablets (2.5 mg total) by mouth daily. 45 tablet 3   aspirin EC 81 MG EC tablet Take 1 tablet (81 mg total) by mouth daily at 6 (six) AM. Swallow whole. 30 tablet 11   cephALEXin (KEFLEX) 500 MG capsule Take 1 capsule (500 mg total) by mouth 4 (four) times daily. 20 capsule 0   clopidogrel (PLAVIX) 75 MG tablet TAKE ONE TABLET BY MOUTH ONCE DAILY. (Patient taking differently: Take 75 mg by mouth daily with supper.) 90 tablet 2   HYDROcodone-acetaminophen (NORCO/VICODIN) 5-325 MG tablet Take 1 tablet by mouth every 4 (four) hours as needed for moderate pain. 10 tablet 0   metoprolol-hydrochlorothiazide (LOPRESSOR HCT) 50-25 MG tablet Take 1 tablet by mouth 3 (three) times daily. (Patient not taking: Reported on 03/29/2021)     Omega-3 Fatty Acids (FISH OIL) 1000 MG CAPS Take 1,000 mg by mouth  daily.      rosuvastatin (CRESTOR) 10 MG tablet TAKE (1) TABLET BY MOUTH ONCE DAILY. 30 tablet 0   sulfamethoxazole-trimethoprim (BACTRIM DS) 800-160 MG tablet Take 1 tablet by mouth 2 (two) times daily for 14 days. 28 tablet 0   No current facility-administered medications for this visit.     ROS:  See HPI  Vitals:   04/12/21 1049  BP: 96/62  Pulse: (!) 48  Resp: 16  Temp: (!) 97.2 F (36.2 C)  SpO2: 100%     Physical Exam:  General appearance: Awake, alert in no apparent distress Cardiac: Heart rate and rhythm are regular Respirations: Nonlabored Incisions: His right groin incision has mild skin edge separation with fibrinous exudate but without dehiscence.  His right thigh incisions have all dehisced with fibrinous exudate apparent, minimal drainage and minimal periwound erythema.   Extremities: Bilateral lower extremity edema.  2+ right lower leg and foot edema.  Both feet are warm with intact sensation and motor function.  Right second toe ulcer has healed.  Left first metatarsal medial head ulcer is stable and dry.    Right proximal thigh  Right mid thigh  Right AK incision   Assessment/Plan:  This is a 70 y.o. male who is s/p:Right iliofemoral endarterectomy with vein patch angioplasty, right femoral to AK popliteal bypass with PTFE (ringed), harvest of GSV and right CIA and EIA  stent on 03/12/2021 by Dr. Trula Slade and stent of the right EIA on 03/15/2021 by Dr. Donzetta Matters. Mid thigh incision was opened up at last OV.  He has fibrinous exudate apparent in all 4 incisions.  These wounds do not appear infected.  I will prescribe Santyl ointment to his thigh incisions.  Continue wet-to-dry dressing changes.  Bilateral lower extremity edema.  He has significant right lower leg edema.  This is most likely due to harvest of right greater saphenous vein as well as the Ace wrap that was around his thigh.  Compression hose was recommended at last visit but he is intolerant of this.  I have  given him written information on how to elevate his leg above heart level and encouraged him to do this at least twice a day.  Follow-up in 2 weeks with RLE arterial duplex.  Previously place bilateral EIA stents will be due for duplex surveillance in January 2023.  Risa Grill, PA-C [facility] [phone]  Clinic MD: Dr. Virl Cagey

## 2021-04-16 ENCOUNTER — Other Ambulatory Visit: Payer: Self-pay

## 2021-04-16 DIAGNOSIS — I739 Peripheral vascular disease, unspecified: Secondary | ICD-10-CM

## 2021-04-26 ENCOUNTER — Other Ambulatory Visit: Payer: Self-pay

## 2021-04-26 ENCOUNTER — Ambulatory Visit (INDEPENDENT_AMBULATORY_CARE_PROVIDER_SITE_OTHER)
Admission: RE | Admit: 2021-04-26 | Discharge: 2021-04-26 | Disposition: A | Discharge status: Home / Self Care | Payer: PPO | Source: Ambulatory Visit | Attending: Surgery | Admitting: Surgery

## 2021-04-26 ENCOUNTER — Ambulatory Visit: Payer: PPO

## 2021-04-26 ENCOUNTER — Ambulatory Visit (HOSPITAL_COMMUNITY)
Admission: RE | Admit: 2021-04-26 | Discharge: 2021-04-26 | Disposition: A | Discharge status: Home / Self Care | Payer: PPO | Source: Ambulatory Visit | Attending: Surgery | Admitting: Surgery

## 2021-04-26 ENCOUNTER — Ambulatory Visit: Payer: PPO | Admitting: Physician Assistant

## 2021-04-26 ENCOUNTER — Encounter: Payer: Self-pay | Admitting: Physician Assistant

## 2021-04-26 ENCOUNTER — Other Ambulatory Visit: Payer: Self-pay | Admitting: Physician Assistant

## 2021-04-26 VITALS — BP 122/77 | HR 90 | Temp 97.9°F | Resp 20 | Ht 68.0 in | Wt 129.1 lb

## 2021-04-26 DIAGNOSIS — I739 Peripheral vascular disease, unspecified: Secondary | ICD-10-CM

## 2021-04-26 DIAGNOSIS — Z95828 Presence of other vascular implants and grafts: Secondary | ICD-10-CM

## 2021-04-26 NOTE — Progress Notes (Signed)
POST OPERATIVE OFFICE NOTE    CC:  F/u for surgery  HPI:  This is a 70 y.o. male who is s/p right iliofemoral endarterectomy with vein patch angioplasty, right femoral to AK popliteal bypass with PTFE (ringed), harvest of GSV and right CIA and EIA stent on 03/12/2021 by Dr. Trula Slade and stent of the right EIA on 03/15/2021 by Dr. Donzetta Matters.  His last stent placement was due to decreased flow in his right lower extremity bypass graft.  He also has history of right second toe wound.  He had previously placed bilateral EIA stents placed by Dr. Oneida Alar in 2020.  Pt was last seen on 04/12/2021 and at that time his mid thigh incision was opened at the previous visit and started on wet to dry dressing changes.  He was put on 2 weeks of Bactrim.  He was up and ambulating well.  No claudication or rest pain.  Santyl was prescribed for his thigh incisions.  He was still having some lower extremity edema and was encouraged to elevate his leg and wrap with ace since he did not tolerate compession sock.    He returns today for wound check and vascular studies and with his wife.  He states he is doing pretty good.  He says there is light at the end of the tunnel but it is dim.  He states the swelling in his leg is better.  They are doing daily dressing changes with santyl.  They are not using compression but he is elevating his legs.  He continues to smoke.   No Known Allergies  Current Outpatient Medications  Medication Sig Dispense Refill   amLODipine (NORVASC) 5 MG tablet Take 0.5 tablets (2.5 mg total) by mouth daily. 45 tablet 3   aspirin EC 81 MG EC tablet Take 1 tablet (81 mg total) by mouth daily at 6 (six) AM. Swallow whole. 30 tablet 11   cephALEXin (KEFLEX) 500 MG capsule Take 1 capsule (500 mg total) by mouth 4 (four) times daily. (Patient not taking: Reported on 04/12/2021) 20 capsule 0   clopidogrel (PLAVIX) 75 MG tablet TAKE ONE TABLET BY MOUTH ONCE DAILY. (Patient taking differently: Take 75 mg by  mouth daily with supper.) 90 tablet 2   collagenase (SANTYL) ointment Apply 1 application topically daily. 30 g 1   HYDROcodone-acetaminophen (NORCO/VICODIN) 5-325 MG tablet Take 1 tablet by mouth every 4 (four) hours as needed for moderate pain. 10 tablet 0   metoprolol-hydrochlorothiazide (LOPRESSOR HCT) 50-25 MG tablet Take 1 tablet by mouth 3 (three) times daily. (Patient not taking: No sig reported)     Omega-3 Fatty Acids (FISH OIL) 1000 MG CAPS Take 1,000 mg by mouth daily.      rosuvastatin (CRESTOR) 10 MG tablet TAKE (1) TABLET BY MOUTH ONCE DAILY. 30 tablet 0   No current facility-administered medications for this visit.     ROS:  See HPI  Physical Exam:  Today's Vitals   04/26/21 0902  BP: 122/77  Pulse: 90  Resp: 20  Temp: 97.9 F (36.6 C)  TempSrc: Temporal  SpO2: 96%  Weight: 129 lb 1.6 oz (58.6 kg)  Height: 5\' 8"  (1.727 m)  PainSc: 4   PainLoc: Leg   Body mass index is 19.63 kg/m.   Incision:    Right groin   Mid thigh   Distal thigh   Extremities:  easily palpable right DP pulse    Vascular studies 04/26/2021: Lower extremity duplex: Summary:  Right: Limited visualization of bypass  due to bandages, but no obvious  areas of stenosis or occlusion. Patent outflow artery with triphasic flow.  ABI: Right:  1.25/0.69 with great toe pressure 84 Left:  1.02/absent   Assessment/Plan:  This is a 70 y.o. male who is s/p: ight iliofemoral endarterectomy with vein patch angioplasty, right femoral to AK popliteal bypass with PTFE (ringed), harvest of GSV and right CIA and EIA stent on 03/12/2021 by Dr. Trula Slade and stent of the right EIA on 03/15/2021 by Dr. Donzetta Matters.  His last stent placement was due to decreased flow in his right lower extremity bypass graft.  He also has history of right second toe wound.  He had previously placed bilateral EIA stents placed by Dr. Oneida Alar in 2020.  -pt's wound look good without evidence of infection.  Continue dressing  changes with santyl daily.  HH is coming out to keep an eye on his wounds.  Discussed with pt that it will take several weeks for these to heal.   Wound on right 2nd toe essentially healed.  -continue statin/asa/plavix -pt for duplex in January 2023 for bilateral EIA stents-will add this at his next visit to be scheduled.  -he will f/u in a couple of weeks for wound check.  He will call sooner if there are any issues.     Leontine Locket, [facility] [phone]   Clinic MD:  Trula Slade

## 2021-04-28 DIAGNOSIS — I4891 Unspecified atrial fibrillation: Secondary | ICD-10-CM | POA: Diagnosis not present

## 2021-04-28 DIAGNOSIS — R2681 Unsteadiness on feet: Secondary | ICD-10-CM | POA: Diagnosis not present

## 2021-04-28 DIAGNOSIS — F17210 Nicotine dependence, cigarettes, uncomplicated: Secondary | ICD-10-CM | POA: Diagnosis not present

## 2021-04-28 DIAGNOSIS — I70201 Unspecified atherosclerosis of native arteries of extremities, right leg: Secondary | ICD-10-CM | POA: Diagnosis not present

## 2021-04-28 DIAGNOSIS — T82398D Other mechanical complication of other vascular grafts, subsequent encounter: Secondary | ICD-10-CM | POA: Diagnosis not present

## 2021-04-28 DIAGNOSIS — I10 Essential (primary) hypertension: Secondary | ICD-10-CM | POA: Diagnosis not present

## 2021-05-04 ENCOUNTER — Other Ambulatory Visit (HOSPITAL_COMMUNITY): Payer: Self-pay | Admitting: Internal Medicine

## 2021-05-04 ENCOUNTER — Other Ambulatory Visit: Payer: Self-pay | Admitting: Internal Medicine

## 2021-05-04 DIAGNOSIS — M79604 Pain in right leg: Secondary | ICD-10-CM

## 2021-05-10 ENCOUNTER — Ambulatory Visit (INDEPENDENT_AMBULATORY_CARE_PROVIDER_SITE_OTHER): Payer: PPO | Admitting: Physician Assistant

## 2021-05-10 ENCOUNTER — Other Ambulatory Visit: Payer: Self-pay

## 2021-05-10 VITALS — BP 125/79 | HR 103 | Temp 98.2°F | Resp 16 | Ht 68.0 in | Wt 133.9 lb

## 2021-05-10 DIAGNOSIS — I739 Peripheral vascular disease, unspecified: Secondary | ICD-10-CM

## 2021-05-10 DIAGNOSIS — Z95828 Presence of other vascular implants and grafts: Secondary | ICD-10-CM

## 2021-05-10 DIAGNOSIS — T8131XD Disruption of external operation (surgical) wound, not elsewhere classified, subsequent encounter: Secondary | ICD-10-CM

## 2021-05-10 NOTE — Progress Notes (Signed)
Office Note     CC:  follow up Requesting Provider:  Redmond School, MD  HPI: Todd [name] is a 70 y.o. (01/09/1951) male who presents for wound check. He is s/p right iliofemoral endarterectomy with vein patch angioplasty, right femoral to AK popliteal bypass with PTFE (ringed), harvest of GSV and right CIA and EIA stent on 03/12/2021 by Dr. Trula Slade and stent of the right EIA on 03/15/2021 by Dr. Donzetta Matters.  His last stent placement was due to decreased flow in his right lower extremity bypass graft.  He was last seen on 04/26/21 at which time he was doing better but was continuing to dress his right lower extremity incisions with santyl. At his first post operative visit his bypass and saphenectomy incisions had some dehiscence and so he has since been doing daily dressing changes. He has had issues with RLE swelling since surgery which has complicated his wound healing. He has been mostly elevating his leg as he cannot tolerate compression stockings.  Today he says that overall his leg is feeling okay. He has been increasing his activity. However he reports that when he does then the swelling in his right leg gets worse. He continues to elevate. He and his wife have been doing daily dressing changes with santyl to his right leg incisions. Report mostly clear- yellowish drainage. Deny any bleeding or purulent drainage. No fever or chills.   The pt is on a statin for cholesterol management.  The pt is on a daily aspirin.   Other AC:  Plavix The pt is on CCB, BB, HCTZ for hypertension.   The pt is not diabetic.   Tobacco hx:  current  Past Medical History:  Diagnosis Date   Alcoholism ([place])    History of withdrawal and seizures   Atrial fibrillation ([place])    Taken off of Coumadin in 2009 in the setting of GI bleed   Chronic back pain    Colitis    4/11   Essential hypertension    Gout    History of GI bleed    Internal hemorrhoids    Colonoscopy 11/09   Osteoarthritis     Peripheral vascular disease ([place])    Schatzki's ring    EGD 11/09    Past Surgical History:  Procedure Laterality Date   ABDOMINAL AORTOGRAM N/A 03/08/2019   Procedure: ABDOMINAL AORTOGRAM;  Surgeon: Elam Dutch, MD;  Location: [place] [facility];  Service: Cardiovascular;  Laterality: N/A;   ABDOMINAL AORTOGRAM W/LOWER EXTREMITY N/A 03/02/2021   Procedure: ABDOMINAL AORTOGRAM W/LOWER EXTREMITY;  Surgeon: Serafina Mitchell, MD;  Location: [place] [facility];  Service: Cardiovascular;  Laterality: N/A;   Excision of gouty lesion     Left index finger   FEMORAL-POPLITEAL BYPASS GRAFT Right 03/12/2021   Procedure: RIGHT FEMORAL TO POPLITEAL ARTERY BYPASS GRAFTING USING THE PROPATEN GRAFT;  Surgeon: Serafina Mitchell, MD;  Location: [facility];  Service: Vascular;  Laterality: Right;   INSERTION OF ILIAC STENT Right 03/12/2021   Procedure: INSERTION OF  EXTERNAL ILIAC STENT;  Surgeon: Serafina Mitchell, MD;  Location: [facility];  Service: Vascular;  Laterality: Right;   LOWER EXTREMITY ANGIOGRAM Right 03/12/2021   Procedure: LOWER EXTREMITY ANGIOGRAM;  Surgeon: Serafina Mitchell, MD;  Location: [facility];  Service: Vascular;  Laterality: Right;   LOWER EXTREMITY ANGIOGRAPHY Bilateral 03/08/2019   Procedure: Lower Extremity Angiography;  Surgeon: Elam Dutch, MD;  Location: [place] [facility];  Service: Cardiovascular;  Laterality: Bilateral;  LOWER EXTREMITY ANGIOGRAPHY N/A 03/15/2021   Procedure: LOWER EXTREMITY ANGIOGRAPHY;  Surgeon: Waynetta Sandy, MD;  Location: [place] [facility];  Service: Cardiovascular;  Laterality: N/A;   MASS EXCISION Right 07/07/2020   Procedure: EXCISION OF RIGHT FACIAL MASS;  Surgeon: Leta Baptist, MD;  Location: [place];  Service: ENT;  Laterality: Right;   PERIPHERAL VASCULAR BALLOON ANGIOPLASTY Right 03/02/2021   Procedure: PERIPHERAL VASCULAR BALLOON ANGIOPLASTY;  Surgeon: Serafina Mitchell, MD;  Location: [place] [facility];  Service: Cardiovascular;   Laterality: Right;  external iliac   PERIPHERAL VASCULAR INTERVENTION Bilateral 03/08/2019   Procedure: PERIPHERAL VASCULAR INTERVENTION;  Surgeon: Elam Dutch, MD;  Location: [place] [facility];  Service: Cardiovascular;  Laterality: Bilateral;  ext iliac artery stent   PERIPHERAL VASCULAR INTERVENTION Left 03/02/2021   Procedure: PERIPHERAL VASCULAR INTERVENTION;  Surgeon: Serafina Mitchell, MD;  Location: [place] [facility];  Service: Cardiovascular;  Laterality: Left;  external iliac   PERIPHERAL VASCULAR INTERVENTION Right 03/15/2021   Procedure: PERIPHERAL VASCULAR INTERVENTION;  Surgeon: Waynetta Sandy, MD;  Location: [place] [facility];  Service: Cardiovascular;  Laterality: Right;  external iliac   Right knee arthroscopy     SPINE SURGERY      Social History   Socioeconomic History   Marital status: Married    Spouse name: Not on file   Number of children: Not on file   Years of education: Not on file   Highest education level: Not on file  Occupational History   Occupation: Retired    Fish farm manager: [employer]  Tobacco Use   Smoking status: Every Day    Packs/day: 0.50    Types: Cigarettes   Smokeless tobacco: Never  Vaping Use   Vaping Use: Never used  Substance and Sexual Activity   Alcohol use: No    Alcohol/week: 0.0 standard drinks    Comment: History of heavy alcohol use, most recent quit attempt has lasted 3 months, occassionally drinks alcohol   Drug use: No   Sexual activity: Never  Other Topics Concern   Not on file  Social History Narrative   Not on file   Social Determinants of Health   Financial Resource Strain: Not on file  Food Insecurity: Not on file  Transportation Needs: Not on file  Physical Activity: Not on file  Stress: Not on file  Social Connections: Not on file  Intimate Partner Violence: Not on file    Family History  Problem Relation Age of Onset   Coronary artery disease Father    Coronary artery disease Sister         MI at age 11    Current Outpatient Medications  Medication Sig Dispense Refill   amLODipine (NORVASC) 5 MG tablet Take 0.5 tablets (2.5 mg total) by mouth daily. 45 tablet 3   aspirin EC 81 MG EC tablet Take 1 tablet (81 mg total) by mouth daily at 6 (six) AM. Swallow whole. 30 tablet 11   cephALEXin (KEFLEX) 500 MG capsule Take 1 capsule (500 mg total) by mouth 4 (four) times daily. 20 capsule 0   clopidogrel (PLAVIX) 75 MG tablet TAKE ONE TABLET BY MOUTH ONCE DAILY. (Patient taking differently: Take 75 mg by mouth daily with supper.) 90 tablet 2   collagenase (SANTYL) ointment Apply 1 application topically daily. 30 g 1   HYDROcodone-acetaminophen (NORCO/VICODIN) 5-325 MG tablet Take 1 tablet by mouth every 4 (four) hours as needed for moderate pain. 10 tablet 0  metoprolol-hydrochlorothiazide (LOPRESSOR HCT) 50-25 MG tablet Take 1 tablet by mouth 3 (three) times daily.     Omega-3 Fatty Acids (FISH OIL) 1000 MG CAPS Take 1,000 mg by mouth daily.      rosuvastatin (CRESTOR) 10 MG tablet TAKE (1) TABLET BY MOUTH ONCE DAILY. 30 tablet 0   No current facility-administered medications for this visit.    No Known Allergies   REVIEW OF SYSTEMS:  [X]  denotes positive finding, [ ]  denotes negative finding Cardiac  Comments:  Chest pain or chest pressure:    Shortness of breath upon exertion:    Short of breath when lying flat:    Irregular heart rhythm:        Vascular    Pain in calf, thigh, or hip brought on by ambulation:    Pain in feet at night that wakes you up from your sleep:     Blood clot in your veins:    Leg swelling:  X       Pulmonary    Oxygen at home:    Productive cough:     Wheezing:         Neurologic    Sudden weakness in arms or legs:     Sudden numbness in arms or legs:     Sudden onset of difficulty speaking or slurred speech:    Temporary loss of vision in one eye:     Problems with dizziness:         Gastrointestinal    Blood in stool:      Vomited blood:         Genitourinary    Burning when urinating:     Blood in urine:        Psychiatric    Major depression:         Hematologic    Bleeding problems:    Problems with blood clotting too easily:        Skin    Rashes or ulcers:        Constitutional    Fever or chills:      PHYSICAL EXAMINATION:  Vitals:   05/10/21 0917  BP: 125/79  Pulse: (!) 103  Resp: 16  Temp: 98.2 F (36.8 C)  TempSrc: Temporal  SpO2: 95%  Weight: 133 lb 14.4 oz (60.7 kg)  Height: 5\' 8"  (1.727 m)    General:  WDWN in NAD; vital signs documented above Gait: Normal HENT: WNL, normocephalic Pulmonary: normal non-labored breathing , without  wheezing Cardiac: regular HR, without  Murmurs without carotid bruit Abdomen: soft, NT, no masses Vascular Exam/Pulses:  Right Left  Radial 2+ (normal) 2+ (normal)  Femoral 2+ (normal) 2+ (normal)  Popliteal Not palpable Not palpable  DP 1+ (weak), difficult to palpate with edema 1+ (weak)  PT absent absent   Extremities: without ischemic changes, without Gangrene , without cellulitis; with open wounds of right groin and right thigh incisions. Groin and proximal most incisions are almost healed. Small .25 cm openings in both with some slough present. Probed with cotton tipped applicator  and they do not track. Right mid thigh and distal right medial thigh approximately 3 cm x 2 cm. Healthy granulation tissue in wound bed. No undermining or tunneling. Also used cotton tipped applicator to explore wound bed. No surrounding erythema. Mild serous drainage but no purulence present. Wet to dry dressings applied.  Musculoskeletal: no muscle wasting or atrophy  Neurologic: A&O X 3;  No focal weakness or paresthesias are detected Psychiatric:  The pt has Normal affect.  ASSESSMENT/PLAN:: 70 y.o. male here for follow up for or wound check. He is s/p right iliofemoral endarterectomy with vein patch angioplasty, right femoral to AK popliteal bypass with  PTFE (ringed), harvest of GSV and right CIA and EIA stent on 03/12/2021 by Dr. Trula Slade and stent of the right EIA on 03/15/2021 by Dr. Donzetta Matters. Right groin and proximal thigh incisions almost healed. The right mid thigh and distal thigh incisions are healing well. Healthy granulation tissue In wound bed. Right 2nd toe healed.  - Continue daily dressing changes with santyl - Continue statin/ aspirin/ plavix - Continue to elevate legs as needed for swelling - they will call for earlier follow up if any concerns about wounds - he will be due for his 6 month follow up Aorto/iliac/ IVC duplex in January 2023 - I will have him return in 3 weeks for another wound check   Karoline Caldwell, PA-C [facility] [phone]  Clinic MD:   Trula Slade

## 2021-05-24 DIAGNOSIS — T8189XA Other complications of procedures, not elsewhere classified, initial encounter: Secondary | ICD-10-CM | POA: Diagnosis not present

## 2021-05-31 ENCOUNTER — Encounter: Payer: Self-pay | Admitting: Physician Assistant

## 2021-05-31 ENCOUNTER — Other Ambulatory Visit: Payer: Self-pay

## 2021-05-31 ENCOUNTER — Ambulatory Visit: Payer: PPO | Admitting: Physician Assistant

## 2021-05-31 VITALS — BP 135/74 | HR 84 | Temp 98.2°F | Resp 20 | Ht 68.0 in | Wt 125.1 lb

## 2021-05-31 DIAGNOSIS — I739 Peripheral vascular disease, unspecified: Secondary | ICD-10-CM

## 2021-05-31 NOTE — Progress Notes (Signed)
POST OPERATIVE OFFICE NOTE    CC:  F/u for surgery  HPI:  Todd [name] is a 70 y.o. (1951-01-11) male who presents for wound check. He is s/p right iliofemoral endarterectomy with vein patch angioplasty, right femoral to AK popliteal bypass with PTFE (ringed), harvest of GSV and right CIA and EIA stent on 03/12/2021 by Dr. Trula Slade and stent of the right EIA on 03/15/2021 by Dr. Donzetta Matters.  His last stent placement was due to decreased flow in his right lower extremity bypass graft.  His wife has been doing daily dressing changes.  He denise fever, chills, claudication, rest pain or non healing foot wounds.  He states he has no claudication symptoms at all since his surgery and he is very pleased.  Pt returns today for follow up wound checks.    The pt is on a statin for cholesterol management.  The pt is on a daily aspirin.   Other AC:  Plavix The pt is on CCB, BB, HCTZ for hypertension.   The pt is not diabetic.   Tobacco hx:  current No Known Allergies  Current Outpatient Medications  Medication Sig Dispense Refill   amLODipine (NORVASC) 5 MG tablet Take 0.5 tablets (2.5 mg total) by mouth daily. 45 tablet 3   aspirin EC 81 MG EC tablet Take 1 tablet (81 mg total) by mouth daily at 6 (six) AM. Swallow whole. 30 tablet 11   cephALEXin (KEFLEX) 500 MG capsule Take 1 capsule (500 mg total) by mouth 4 (four) times daily. 20 capsule 0   clopidogrel (PLAVIX) 75 MG tablet TAKE ONE TABLET BY MOUTH ONCE DAILY. (Patient taking differently: Take 75 mg by mouth daily with supper.) 90 tablet 2   collagenase (SANTYL) ointment Apply 1 application topically daily. 30 g 1   HYDROcodone-acetaminophen (NORCO/VICODIN) 5-325 MG tablet Take 1 tablet by mouth every 4 (four) hours as needed for moderate pain. 10 tablet 0   metoprolol-hydrochlorothiazide (LOPRESSOR HCT) 50-25 MG tablet Take 1 tablet by mouth 3 (three) times daily.     Omega-3 Fatty Acids (FISH OIL) 1000 MG CAPS Take 1,000 mg by mouth daily.       rosuvastatin (CRESTOR) 10 MG tablet TAKE (1) TABLET BY MOUTH ONCE DAILY. 30 tablet 0   No current facility-administered medications for this visit.     ROS:  See HPI  Physical Exam:      Medial thigh wound is 1/3 cm deep.  No erythema or drainage Incision:  healing well Extremities:  feet warm without ischemic changes, mod -min right LE pitting edema.  He states it has improved. lungs: non labored breathing with rhonchi Neurologic: A&O X 3;  No focal weakness or paresthesias are detected   Assessment/Plan:  70 y.o. male here for follow up for or wound check. He is s/p right iliofemoral endarterectomy with vein patch angioplasty, right femoral to AK popliteal bypass with PTFE (ringed), harvest of GSV and right CIA and EIA stent on 03/12/2021 by Dr. Trula Slade and stent of the right EIA on 03/15/2021 by Dr. Donzetta Matters. Right groin and proximal thigh incisions almost healed. The right mid thigh and distal thigh incisions are healing well. Healthy granulation tissue In wound bed. Right 2nd toe healed.   Continue daily dressing changes.  Alternate activity with elevation to assist edema in the right LE.   He will f/u in January for aortoiliac duplex and ABI's.  If he develops problems or concerns he will call sooner.    Roxy Horseman  PA-C [facility] [phone]   Clinic MD:  Trula Slade

## 2021-06-02 ENCOUNTER — Other Ambulatory Visit: Payer: Self-pay | Admitting: *Deleted

## 2021-06-02 DIAGNOSIS — I739 Peripheral vascular disease, unspecified: Secondary | ICD-10-CM

## 2021-06-04 DIAGNOSIS — I4891 Unspecified atrial fibrillation: Secondary | ICD-10-CM | POA: Diagnosis not present

## 2021-06-04 DIAGNOSIS — R2681 Unsteadiness on feet: Secondary | ICD-10-CM | POA: Diagnosis not present

## 2021-06-04 DIAGNOSIS — I10 Essential (primary) hypertension: Secondary | ICD-10-CM | POA: Diagnosis not present

## 2021-06-04 DIAGNOSIS — T82398D Other mechanical complication of other vascular grafts, subsequent encounter: Secondary | ICD-10-CM | POA: Diagnosis not present

## 2021-06-04 DIAGNOSIS — I70201 Unspecified atherosclerosis of native arteries of extremities, right leg: Secondary | ICD-10-CM | POA: Diagnosis not present

## 2021-06-04 DIAGNOSIS — F17210 Nicotine dependence, cigarettes, uncomplicated: Secondary | ICD-10-CM | POA: Diagnosis not present

## 2021-06-22 ENCOUNTER — Other Ambulatory Visit: Payer: Self-pay | Admitting: Physician Assistant

## 2021-06-23 ENCOUNTER — Other Ambulatory Visit: Payer: Self-pay | Admitting: Physician Assistant

## 2021-07-01 DIAGNOSIS — I4891 Unspecified atrial fibrillation: Secondary | ICD-10-CM | POA: Diagnosis not present

## 2021-07-01 DIAGNOSIS — R2681 Unsteadiness on feet: Secondary | ICD-10-CM | POA: Diagnosis not present

## 2021-07-01 DIAGNOSIS — F17210 Nicotine dependence, cigarettes, uncomplicated: Secondary | ICD-10-CM | POA: Diagnosis not present

## 2021-07-01 DIAGNOSIS — T82398D Other mechanical complication of other vascular grafts, subsequent encounter: Secondary | ICD-10-CM | POA: Diagnosis not present

## 2021-07-01 DIAGNOSIS — I10 Essential (primary) hypertension: Secondary | ICD-10-CM | POA: Diagnosis not present

## 2021-07-01 DIAGNOSIS — I70201 Unspecified atherosclerosis of native arteries of extremities, right leg: Secondary | ICD-10-CM | POA: Diagnosis not present

## 2021-07-05 ENCOUNTER — Encounter (HOSPITAL_COMMUNITY): Payer: PPO

## 2021-07-05 ENCOUNTER — Ambulatory Visit: Payer: PPO

## 2021-07-06 ENCOUNTER — Telehealth: Payer: Self-pay

## 2021-07-06 NOTE — Telephone Encounter (Signed)
Pt's spouse called to see if we could move up his APP appt due to having "3 small holes that have not healed" since his surgery. She also states one groin incision has started draining. Pt is scheduled for studies and APP appt for tomorrow.

## 2021-07-07 ENCOUNTER — Ambulatory Visit (HOSPITAL_COMMUNITY)
Admission: RE | Admit: 2021-07-07 | Discharge: 2021-07-07 | Disposition: A | Discharge status: Home / Self Care | Payer: PPO | Source: Ambulatory Visit | Attending: Physician Assistant | Admitting: Physician Assistant

## 2021-07-07 ENCOUNTER — Ambulatory Visit (INDEPENDENT_AMBULATORY_CARE_PROVIDER_SITE_OTHER)
Admission: RE | Admit: 2021-07-07 | Discharge: 2021-07-07 | Disposition: A | Discharge status: Home / Self Care | Payer: PPO | Source: Ambulatory Visit | Attending: Physician Assistant | Admitting: Physician Assistant

## 2021-07-07 ENCOUNTER — Ambulatory Visit: Payer: PPO | Admitting: Physician Assistant

## 2021-07-07 ENCOUNTER — Other Ambulatory Visit (HOSPITAL_COMMUNITY): Payer: Self-pay | Admitting: Physician Assistant

## 2021-07-07 ENCOUNTER — Other Ambulatory Visit: Payer: Self-pay

## 2021-07-07 VITALS — BP 142/84 | HR 90 | Temp 97.4°F | Ht 68.0 in | Wt 127.4 lb

## 2021-07-07 DIAGNOSIS — T8131XD Disruption of external operation (surgical) wound, not elsewhere classified, subsequent encounter: Secondary | ICD-10-CM

## 2021-07-07 DIAGNOSIS — I739 Peripheral vascular disease, unspecified: Secondary | ICD-10-CM

## 2021-07-07 NOTE — Progress Notes (Signed)
History of Present Illness:  Patient is a 71 y.o. year old male who presents for evaluation of PAD in B LE.   s/p right iliofemoral endarterectomy with vein patch angioplasty, right femoral to AK popliteal bypass with PTFE (ringed), harvest of GSV and right CIA and EIA stent on 03/12/2021 by Dr. Trula Slade and stent of the right EIA on 03/15/2021 by Dr. Donzetta Matters.  His last stent placement was due to decreased flow in his right lower extremity bypass graft.  He also has history of right second toe wound.  He had previously placed bilateral EIA stents placed by Dr. Oneida Alar in 2020.  He has been followed monthly for incisional wounds.    He has had problems with healing his incisions.  His wife has been doing his dressing changes at home.  In the past 3-4 days he has had clear drainage from the groin incision. He denise fever and chills.   It has soaked through his cloths.  He denise rest pain or claudication.  He has edema and is elevating his legs when at rest.    The pt is on a statin for cholesterol management.  The pt is on a daily aspirin.   Other AC:  Plavix The pt is on CCB, BB, HCTZ for hypertension.   The pt is not diabetic.   Tobacco hx:  current No Known Allergies  Past Medical History:  Diagnosis Date   Alcoholism ([place])    History of withdrawal and seizures   Atrial fibrillation ([place])    Taken off of Coumadin in 2009 in the setting of GI bleed   Chronic back pain    Colitis    4/11   Essential hypertension    Gout    History of GI bleed    Internal hemorrhoids    Colonoscopy 11/09   Osteoarthritis    Peripheral vascular disease ([place])    Schatzki's ring    EGD 11/09    Past Surgical History:  Procedure Laterality Date   ABDOMINAL AORTOGRAM N/A 03/08/2019   Procedure: ABDOMINAL AORTOGRAM;  Surgeon: Elam Dutch, MD;  Location: [facility];  Service: Cardiovascular;  Laterality: N/A;   ABDOMINAL AORTOGRAM W/LOWER EXTREMITY N/A 03/02/2021   Procedure: ABDOMINAL  AORTOGRAM W/LOWER EXTREMITY;  Surgeon: Serafina Mitchell, MD;  Location: [facility];  Service: Cardiovascular;  Laterality: N/A;   Excision of gouty lesion     Left index finger   FEMORAL-POPLITEAL BYPASS GRAFT Right 03/12/2021   Procedure: RIGHT FEMORAL TO POPLITEAL ARTERY BYPASS GRAFTING USING THE PROPATEN GRAFT;  Surgeon: Serafina Mitchell, MD;  [facility];  Service: Vascular;  Laterality: Right;   INSERTION OF ILIAC STENT Right 03/12/2021   Procedure: INSERTION OF  EXTERNAL ILIAC STENT;  Surgeon: Serafina Mitchell, MD;  [facility];  Service: Vascular;  Laterality: Right;   LOWER EXTREMITY ANGIOGRAM Right 03/12/2021   Procedure: LOWER EXTREMITY ANGIOGRAM;  Surgeon: Serafina Mitchell, MD;  [facility];  Service: Vascular;  Laterality: Right;   LOWER EXTREMITY ANGIOGRAPHY Bilateral 03/08/2019   Procedure: Lower Extremity Angiography;  Surgeon: Elam Dutch, MD;  Location: [facility];  Service: Cardiovascular;  Laterality: Bilateral;   LOWER EXTREMITY ANGIOGRAPHY N/A 03/15/2021   Procedure: LOWER EXTREMITY ANGIOGRAPHY;  Surgeon: Waynetta Sandy, MD;  Location: [facility];  Service: Cardiovascular;  Laterality: N/A;   MASS EXCISION Right 07/07/2020   Procedure: EXCISION OF RIGHT FACIAL MASS;  Surgeon: Leta Baptist,  MD;  Location: [facility];  Service: ENT;  Laterality: Right;   PERIPHERAL VASCULAR BALLOON ANGIOPLASTY Right 03/02/2021   Procedure: PERIPHERAL VASCULAR BALLOON ANGIOPLASTY;  Surgeon: Serafina Mitchell, MD;  Location: [facility];  Service: Cardiovascular;  Laterality: Right;  external iliac   PERIPHERAL VASCULAR INTERVENTION Bilateral 03/08/2019   Procedure: PERIPHERAL VASCULAR INTERVENTION;  Surgeon: Elam Dutch, MD;  Location: [facility];  Service: Cardiovascular;  Laterality: Bilateral;  ext iliac artery stent   PERIPHERAL VASCULAR INTERVENTION Left 03/02/2021   Procedure: PERIPHERAL VASCULAR INTERVENTION;   Surgeon: Serafina Mitchell, MD;  Location: [facility];  Service: Cardiovascular;  Laterality: Left;  external iliac   PERIPHERAL VASCULAR INTERVENTION Right 03/15/2021   Procedure: PERIPHERAL VASCULAR INTERVENTION;  Surgeon: Waynetta Sandy, MD;  Location: [facility];  Service: Cardiovascular;  Laterality: Right;  external iliac   Right knee arthroscopy     SPINE SURGERY      ROS:   General:  No weight loss, Fever, chills  HEENT: No recent headaches, no nasal bleeding, no visual changes, no sore throat  Neurologic: No dizziness, blackouts, seizures. No recent symptoms of stroke or mini- stroke. No recent episodes of slurred speech, or temporary blindness.  Cardiac: No recent episodes of chest pain/pressure, no shortness of breath at rest.  No shortness of breath with exertion.  Denies history of atrial fibrillation or irregular heartbeat  Vascular: No history of rest pain in feet.  No history of claudication.  positive history of non-healing ulcer, No history of DVT   Pulmonary: No home oxygen, no productive cough, no hemoptysis,  No asthma or wheezing  Musculoskeletal:  [ ]  Arthritis, [ ]  Low back pain,  [ ]  Joint pain  Hematologic:No history of hypercoagulable state.  No history of easy bleeding.  No history of anemia  Gastrointestinal: No hematochezia or melena,  No gastroesophageal reflux, no trouble swallowing  Urinary: [ ]  chronic Kidney disease, [ ]  on HD - [ ]  MWF or [ ]  TTHS, [ ]  Burning with urination, [ ]  Frequent urination, [ ]  Difficulty urinating;   Skin: No rashes  Psychological: No history of anxiety,  No history of depression  Social History Social History   Tobacco Use   Smoking status: Every Day    Packs/day: 0.50    Types: Cigarettes   Smokeless tobacco: Never  Vaping Use   Vaping Use: Never used  Substance Use Topics   Alcohol use: No    Alcohol/week: 0.0 standard drinks    Comment: History of heavy alcohol use, most recent  quit attempt has lasted 3 months, occassionally drinks alcohol   Drug use: No    Family History Family History  Problem Relation Age of Onset   Coronary artery disease Father    Coronary artery disease Sister        MI at age 58    Allergies  No Known Allergies   Current Outpatient Medications  Medication Sig Dispense Refill   aspirin EC 81 MG EC tablet Take 1 tablet (81 mg total) by mouth daily at 6 (six) AM. Swallow whole. 30 tablet 11   cephALEXin (KEFLEX) 500 MG capsule Take 1 capsule (500 mg total) by mouth 4 (four) times daily. 20 capsule 0   clopidogrel (PLAVIX) 75 MG tablet TAKE ONE TABLET BY MOUTH ONCE DAILY. (Patient taking differently: Take 75 mg by mouth daily with supper.) 90 tablet 2   collagenase (SANTYL) ointment Apply 1  application topically daily. 30 g 1   HYDROcodone-acetaminophen (NORCO/VICODIN) 5-325 MG tablet Take 1 tablet by mouth every 4 (four) hours as needed for moderate pain. 10 tablet 0   metoprolol-hydrochlorothiazide (LOPRESSOR HCT) 50-25 MG tablet Take 1 tablet by mouth 3 (three) times daily.     Omega-3 Fatty Acids (FISH OIL) 1000 MG CAPS Take 1,000 mg by mouth daily.      rosuvastatin (CRESTOR) 10 MG tablet TAKE (1) TABLET BY MOUTH ONCE DAILY. 30 tablet 0   amLODipine (NORVASC) 5 MG tablet Take 0.5 tablets (2.5 mg total) by mouth daily. 45 tablet 3   No current facility-administered medications for this visit.    Physical Examination  Vitals:   07/07/21 1047  BP: (!) 142/84  Pulse: 90  Temp: (!) 97.4 F (36.3 C)  TempSrc: Temporal  SpO2: 100%  Weight: 127 lb 6.4 oz (57.8 kg)  Height: 5\' 8"  (1.727 m)    Body mass index is 19.37 kg/m.  General:  Alert and oriented, no acute distress HEENT: Normal Neck: No bruit or JVD Pulmonary: Clear to auscultation bilaterally Cardiac: Regular Rate and Rhythm without murmur Abdomen: Soft, non-tender, non-distended, no mass, no scars Skin: No rash Right second toe ulcer has  healed.      Musculoskeletal: No deformity or edema  Neurologic: Upper and lower extremity motor 5/5 and symmetric  DATA:      ABI Findings:  +---------+------------------+-----+--------+--------+   Right     Rt Pressure (mmHg) Index Waveform Comment    +---------+------------------+-----+--------+--------+   Brachial  153                                          +---------+------------------+-----+--------+--------+   PTA       159                1.04  biphasic            +---------+------------------+-----+--------+--------+   DP        0                  0.00  absent              +---------+------------------+-----+--------+--------+   Great Toe 77                 0.50                      +---------+------------------+-----+--------+--------+   +---------+------------------+-----+----------+------------------+   Left      Lt Pressure (mmHg) Index Waveform   Comment              +---------+------------------+-----+----------+------------------+   Brachial  133                                                      +---------+------------------+-----+----------+------------------+   PTA       132                0.86  monophasic                      +---------+------------------+-----+----------+------------------+   DP        105  0.69  monophasic very low amplitude   +---------+------------------+-----+----------+------------------+   Great Toe 61                 0.40                                  +---------+------------------+-----+----------+------------------+   +-------+-----------+-----------+------------+------------+   ABI/TBI Today's ABI Today's TBI Previous ABI Previous TBI   +-------+-----------+-----------+------------+------------+   Right   1.04        0.5         1.25         0.69           +-------+-----------+-----------+------------+------------+   Left    0.86        0.4         1.02         0               +-------+-----------+-----------+------------+------------+      Previous ABI on 04/26/21.     Summary:  Right: Resting right ankle-brachial index is within normal range. The  right toe-brachial index is abnormal.   Left: Resting left ankle-brachial index indicates mild left lower  extremity arterial disease. The left toe-brachial index is abnormal.      Right Stent(s):  +---------------+--------+--------+----------+--------+   EIA             PSV cm/s Stenosis Waveform   Comments   +---------------+--------+--------+----------+--------+   Prox to Stent   156               biphasic              +---------------+--------+--------+----------+--------+   Proximal Stent  183               biphasic              +---------------+--------+--------+----------+--------+   Mid Stent       89                biphasic              +---------------+--------+--------+----------+--------+   Distal Stent    99                monophasic            +---------------+--------+--------+----------+--------+   Distal to Stent 96                monophasic            +---------------+--------+--------+----------+--------+      Left Stent(s):  +---------------+--------+--------+--------+--------------+   EIA             PSV cm/s Stenosis Waveform Comments         +---------------+--------+--------+--------+--------------+   Prox to Stent                              Not visualized   +---------------+--------+--------+--------+--------------+   Proximal Stent                             Not visualized   +---------------+--------+--------+--------+--------------+   Mid Stent                                  Not visualized   +---------------+--------+--------+--------+--------------+  Distal Stent    63                biphasic                  +---------------+--------+--------+--------+--------------+   Distal to Stent 123               biphasic                   +---------------+--------+--------+--------+--------------+      Summary:  Stenosis:  Right: Patent stent with no stenosis.  Left: Limited visualization due to bowel. Patent distal stent.    ASSESSMENT/PLAN:  PAD  s/p right iliofemoral endarterectomy with vein patch angioplasty, right femoral to AK popliteal bypass with PTFE (ringed), harvest of GSV and right CIA and EIA stent on 03/12/2021 by Dr. Trula Slade and stent of the right EIA on 03/15/2021 by Dr. Donzetta Matters.  His last stent placement was due to decreased flow in his right lower extremity bypass graft.  The right second toe ulcer has healed.    He develops clear drainage right groin incision from a superficial opening.  Picture above.  There is no tunneling, no visibility of bypass graft when probed. or  erythema.  The bypass is open, the left/right stent is patent with distal stent biphasic flow.  ABI's are stable.    The main concern is the new onset of drainage at the right groin incision.  I asked him to continue dry dressing PRN and f/u next week when [facility] is in the office.  If the bypass becomes infected he will need it removed.  The patient agreed to this plan.  He wishes to give it a little more time and then he can discuss his options with Dr. Trula Slade.  He will call if he develops symptoms of frank infection.      Roxy Horseman PA-C [facility] Office: [phone]  MD in office [place]

## 2021-07-12 ENCOUNTER — Ambulatory Visit: Payer: PPO

## 2021-07-19 ENCOUNTER — Other Ambulatory Visit: Payer: Self-pay

## 2021-07-19 ENCOUNTER — Encounter (HOSPITAL_COMMUNITY): Payer: PPO

## 2021-07-19 ENCOUNTER — Ambulatory Visit: Payer: PPO

## 2021-07-19 ENCOUNTER — Ambulatory Visit (INDEPENDENT_AMBULATORY_CARE_PROVIDER_SITE_OTHER): Payer: PPO | Admitting: Physician Assistant

## 2021-07-19 VITALS — BP 138/82 | HR 90 | Temp 97.5°F | Resp 18 | Ht 68.0 in | Wt 126.6 lb

## 2021-07-19 DIAGNOSIS — T8131XD Disruption of external operation (surgical) wound, not elsewhere classified, subsequent encounter: Secondary | ICD-10-CM

## 2021-07-19 DIAGNOSIS — I739 Peripheral vascular disease, unspecified: Secondary | ICD-10-CM | POA: Diagnosis not present

## 2021-07-19 NOTE — Progress Notes (Signed)
[facility] HISTORY AND PHYSICAL   History of Present Illness:  Patient is a 71 y.o. year old male who presents for evaluation of PAD with history of right foot ulcer s/p Femoral to above knee popliteal bypass with PTFE by Dr. Trula Slade on 03/12/21.  Followed by right Ext iliac stents by Dr. Donzetta Matters.    His right LE incisions were all slow to heal.  His wife has been doing dressing changes daily.  Over the past few weeks he has developed clear drainage from the right groin incision.  There is no visible graft in the wound bed.  He denies fever and chills.    He states his right LE feels better and he is more mobile without symptoms of rest pain or claudication.  He is on Plavix daily and has even been using his treadmill at home.  He continues to smoke daily.      Past Medical History:  Diagnosis Date   Alcoholism [facility])    History of withdrawal and seizures   Atrial fibrillation ([place])    Taken off of Coumadin in 2009 in the setting of GI bleed   Chronic back pain    Colitis    4/11   Essential hypertension    Gout    History of GI bleed    Internal hemorrhoids    Colonoscopy 11/09   Osteoarthritis    Peripheral vascular disease ([place])    Schatzki's ring    EGD 11/09    Past Surgical History:  Procedure Laterality Date   ABDOMINAL AORTOGRAM N/A 03/08/2019   Procedure: ABDOMINAL AORTOGRAM;  Surgeon: Elam Dutch, MD;  Location: [facility];  Service: Cardiovascular;  Laterality: N/A;   ABDOMINAL AORTOGRAM W/LOWER EXTREMITY N/A 03/02/2021   Procedure: ABDOMINAL AORTOGRAM W/LOWER EXTREMITY;  Surgeon: Serafina Mitchell, MD;  Location: [facility];  Service: Cardiovascular;  Laterality: N/A;   Excision of gouty lesion     Left index finger   FEMORAL-POPLITEAL BYPASS GRAFT Right 03/12/2021   Procedure: RIGHT FEMORAL TO POPLITEAL ARTERY BYPASS GRAFTING USING THE PROPATEN GRAFT;  Surgeon: Serafina Mitchell, MD;  [facility];  Service: Vascular;   Laterality: Right;   INSERTION OF ILIAC STENT Right 03/12/2021   Procedure: INSERTION OF  EXTERNAL ILIAC STENT;  Surgeon: Serafina Mitchell, MD;  [facility];  Service: Vascular;  Laterality: Right;   LOWER EXTREMITY ANGIOGRAM Right 03/12/2021   Procedure: LOWER EXTREMITY ANGIOGRAM;  Surgeon: Serafina Mitchell, MD;  [facility];  Service: Vascular;  Laterality: Right;   LOWER EXTREMITY ANGIOGRAPHY Bilateral 03/08/2019   Procedure: Lower Extremity Angiography;  Surgeon: Elam Dutch, MD;  Location: [facility];  Service: Cardiovascular;  Laterality: Bilateral;   LOWER EXTREMITY ANGIOGRAPHY N/A 03/15/2021   Procedure: LOWER EXTREMITY ANGIOGRAPHY;  Surgeon: Waynetta Sandy, MD;  Location: [facility];  Service: Cardiovascular;  Laterality: N/A;   MASS EXCISION Right 07/07/2020   Procedure: EXCISION OF RIGHT FACIAL MASS;  Surgeon: Leta Baptist, MD;  Location: [facility];  Service: ENT;  Laterality: Right;   PERIPHERAL VASCULAR BALLOON ANGIOPLASTY Right 03/02/2021   Procedure: PERIPHERAL VASCULAR BALLOON ANGIOPLASTY;  Surgeon: Serafina Mitchell, MD;  Location: [place] [facility] [place] [facility];  Service: Cardiovascular;  Laterality: Right;  external iliac   PERIPHERAL VASCULAR INTERVENTION Bilateral 03/08/2019   Procedure: PERIPHERAL VASCULAR INTERVENTION;  Surgeon: Elam Dutch, MD;  Location: [place] [place] [facility];  Service: Cardiovascular;  Laterality: Bilateral;  ext iliac artery stent   PERIPHERAL VASCULAR INTERVENTION Left 03/02/2021   Procedure: PERIPHERAL VASCULAR INTERVENTION;  Surgeon: Serafina Mitchell, MD;  Location: [facility];  Service: Cardiovascular;  Laterality: Left;  external iliac   PERIPHERAL VASCULAR INTERVENTION Right 03/15/2021   Procedure: PERIPHERAL VASCULAR INTERVENTION;  Surgeon: Waynetta Sandy, MD;  Location: [facility];  Service: Cardiovascular;  Laterality: Right;  external iliac   Right knee arthroscopy     SPINE SURGERY       ROS:   General:  No weight loss, Fever, chills  HEENT: No recent headaches, no nasal bleeding, no visual changes, no sore throat  Neurologic: No dizziness, blackouts, seizures. No recent symptoms of stroke or mini- stroke. No recent episodes of slurred speech, or temporary blindness.  Cardiac: No recent episodes of chest pain/pressure, no shortness of breath at rest.  No shortness of breath with exertion.  Denies history of atrial fibrillation or irregular heartbeat  Vascular: No history of rest pain in feet.  No history of claudication.  No history of non-healing ulcer, No history of DVT   Pulmonary: No home oxygen, no productive cough, no hemoptysis,  No asthma or wheezing  Musculoskeletal:  [ ]  Arthritis, [ ]  Low back pain,  [ ]  Joint pain  Hematologic:No history of hypercoagulable state.  No history of easy bleeding.  No history of anemia  Gastrointestinal: No hematochezia or melena,  No gastroesophageal reflux, no trouble swallowing  Urinary: [ ]  chronic Kidney disease, [ ]  on HD - [ ]  MWF or [ ]  TTHS, [ ]  Burning with urination, [ ]  Frequent urination, [ ]  Difficulty urinating;   Skin: No rashes  Psychological: No history of anxiety,  No history of depression  Social History Social History   Tobacco Use   Smoking status: Every Day    Packs/day: 0.50    Types: Cigarettes   Smokeless tobacco: Never  Vaping Use   Vaping Use: Never used  Substance Use Topics   Alcohol use: No    Alcohol/week: 0.0 standard drinks    Comment: History of heavy alcohol use, most recent quit attempt has lasted 3 months, occassionally drinks alcohol   Drug use: No    Family History Family History  Problem Relation Age of Onset   Coronary artery disease Father    Coronary artery disease Sister        MI at age 8    Allergies  No Known Allergies   Current Outpatient Medications  Medication Sig Dispense Refill   aspirin EC 81 MG EC tablet Take 1 tablet (81 mg total) by mouth  daily at 6 (six) AM. Swallow whole. 30 tablet 11   clopidogrel (PLAVIX) 75 MG tablet TAKE ONE TABLET BY MOUTH ONCE DAILY. (Patient taking differently: Take 75 mg by mouth daily with supper.) 90 tablet 2   collagenase (SANTYL) ointment Apply 1 application topically daily. 30 g 1   HYDROcodone-acetaminophen (NORCO/VICODIN) 5-325 MG tablet Take 1 tablet by mouth every 4 (four) hours as needed for moderate pain. 10 tablet 0   metoprolol-hydrochlorothiazide (LOPRESSOR HCT) 50-25 MG tablet Take 1 tablet by mouth 3 (three) times daily.     Omega-3 Fatty Acids (FISH OIL) 1000 MG CAPS Take 1,000 mg by mouth daily.      rosuvastatin (CRESTOR) 10 MG tablet TAKE (1) TABLET BY MOUTH ONCE DAILY. 30 tablet 0   amLODipine (NORVASC) 5 MG tablet Take 0.5 tablets (2.5 mg total) by mouth  daily. 45 tablet 3   cephALEXin (KEFLEX) 500 MG capsule Take 1 capsule (500 mg total) by mouth 4 (four) times daily. (Patient not taking: Reported on 07/19/2021) 20 capsule 0   No current facility-administered medications for this visit.    Physical Examination  Vitals:   07/19/21 1326  BP: 138/82  Pulse: 90  Resp: 18  Temp: (!) 97.5 F (36.4 C)  TempSrc: Oral  SpO2: 100%  Weight: 126 lb 9.6 oz (57.4 kg)  Height: 5\' 8"  (1.727 m)    Body mass index is 19.25 kg/m.  General:  Alert and oriented, no acute distress HEENT: Normal Neck: No bruit or JVD Pulmonary: Clear to auscultation bilaterally Cardiac: Regular Rate and Rhythm without murmur Abdomen: Soft, non-tender, non-distended, no mass, no scars Skin: No rash       Extremity Pulses:  2+ radial, doppler dorsalis pedis, posterior tibial pulses bilaterally Musculoskeletal: right LE edema  Neurologic: Upper and lower extremity motor 5/5 and symmetric     ASSESSMENT:  PAD  s/p right iliofemoral endarterectomy with vein patch angioplasty, right femoral to AK popliteal bypass with PTFE (ringed), harvest of GSV and right CIA and EIA stent on 03/12/2021 by Dr.  Trula Slade and stent of the right EIA on 03/15/2021 by Dr. Donzetta Matters.   He has developed SS drainage from the 0.5 cm non healed right groin incision over the past few weeks.  Foot ulcer has healed completely.    The bypass in patent with doppler flow in the PT/DP/peroneal right LE.    PLAN: Dr. Trula Slade saw the patient in conjunction today and he will be scheduled for right groin incision and debridement with possible wound vac placement.  The patient and his wife agree with this plan.     Roxy Horseman PA-C [facility] Office: [phone] Pager: [phone]

## 2021-07-19 NOTE — H&P (View-Only) (Signed)
[facility] [place] HISTORY AND PHYSICAL   History of Present Illness:  Patient is a 71 y.o. year old male who presents for evaluation of PAD with history of right foot ulcer s/p Femoral to above knee popliteal bypass with PTFE by Dr. Trula Slade on 03/12/21.  Followed by right Ext iliac stents by Dr. Donzetta Matters.    His right LE incisions were all slow to heal.  His wife has been doing dressing changes daily.  Over the past few weeks he has developed clear drainage from the right groin incision.  There is no visible graft in the wound bed.  He denies fever and chills.    He states his right LE feels better and he is more mobile without symptoms of rest pain or claudication.  He is on Plavix daily and has even been using his treadmill at home.  He continues to smoke daily.      Past Medical History:  Diagnosis Date   Alcoholism [facility])    History of withdrawal and seizures   Atrial fibrillation ([place])    Taken off of Coumadin in 2009 in the setting of GI bleed   Chronic back pain    Colitis    4/11   Essential hypertension    Gout    History of GI bleed    Internal hemorrhoids    Colonoscopy 11/09   Osteoarthritis    Peripheral vascular disease ([place])    Schatzki's ring    EGD 11/09    Past Surgical History:  Procedure Laterality Date   ABDOMINAL AORTOGRAM N/A 03/08/2019   Procedure: ABDOMINAL AORTOGRAM;  Surgeon: Elam Dutch, MD;  Location: [place] [facility];  Service: Cardiovascular;  Laterality: N/A;   ABDOMINAL AORTOGRAM W/LOWER EXTREMITY N/A 03/02/2021   Procedure: ABDOMINAL AORTOGRAM W/LOWER EXTREMITY;  Surgeon: Serafina Mitchell, MD;  Location: [place] [facility];  Service: Cardiovascular;  Laterality: N/A;   Excision of gouty lesion     Left index finger   FEMORAL-POPLITEAL BYPASS GRAFT Right 03/12/2021   Procedure: RIGHT FEMORAL TO POPLITEAL ARTERY BYPASS GRAFTING USING THE PROPATEN GRAFT;  Surgeon: Serafina Mitchell, MD;  Location: [facility];  Service: Vascular;   Laterality: Right;   INSERTION OF ILIAC STENT Right 03/12/2021   Procedure: INSERTION OF  EXTERNAL ILIAC STENT;  Surgeon: Serafina Mitchell, MD;  Location: [facility] OR;  Service: Vascular;  Laterality: Right;   LOWER EXTREMITY ANGIOGRAM Right 03/12/2021   Procedure: LOWER EXTREMITY ANGIOGRAM;  Surgeon: Serafina Mitchell, MD;  Location: [facility];  Service: Vascular;  Laterality: Right;   LOWER EXTREMITY ANGIOGRAPHY Bilateral 03/08/2019   Procedure: Lower Extremity Angiography;  Surgeon: Elam Dutch, MD;  Location: [place] [facility];  Service: Cardiovascular;  Laterality: Bilateral;   LOWER EXTREMITY ANGIOGRAPHY N/A 03/15/2021   Procedure: LOWER EXTREMITY ANGIOGRAPHY;  Surgeon: Waynetta Sandy, MD;  Location: [place] [facility];  Service: Cardiovascular;  Laterality: N/A;   MASS EXCISION Right 07/07/2020   Procedure: EXCISION OF RIGHT FACIAL MASS;  Surgeon: Leta Baptist, MD;  Location: [place];  Service: ENT;  Laterality: Right;   PERIPHERAL VASCULAR BALLOON ANGIOPLASTY Right 03/02/2021   Procedure: PERIPHERAL VASCULAR BALLOON ANGIOPLASTY;  Surgeon: Serafina Mitchell, MD;  Location: [place] [facility];  Service: Cardiovascular;  Laterality: Right;  external iliac   PERIPHERAL VASCULAR INTERVENTION Bilateral 03/08/2019   Procedure: PERIPHERAL VASCULAR INTERVENTION;  Surgeon: Elam Dutch, MD;  Location: [place] [facility];  Service: Cardiovascular;  Laterality: Bilateral;  ext iliac artery stent   PERIPHERAL VASCULAR INTERVENTION Left 03/02/2021   Procedure: PERIPHERAL VASCULAR INTERVENTION;  Surgeon: Serafina Mitchell, MD;  Location: [place] [facility];  Service: Cardiovascular;  Laterality: Left;  external iliac   PERIPHERAL VASCULAR INTERVENTION Right 03/15/2021   Procedure: PERIPHERAL VASCULAR INTERVENTION;  Surgeon: Waynetta Sandy, MD;  Location: [place] [facility];  Service: Cardiovascular;  Laterality: Right;  external iliac   Right knee arthroscopy     SPINE SURGERY       ROS:   General:  No weight loss, Fever, chills  HEENT: No recent headaches, no nasal bleeding, no visual changes, no sore throat  Neurologic: No dizziness, blackouts, seizures. No recent symptoms of stroke or mini- stroke. No recent episodes of slurred speech, or temporary blindness.  Cardiac: No recent episodes of chest pain/pressure, no shortness of breath at rest.  No shortness of breath with exertion.  Denies history of atrial fibrillation or irregular heartbeat  Vascular: No history of rest pain in feet.  No history of claudication.  No history of non-healing ulcer, No history of DVT   Pulmonary: No home oxygen, no productive cough, no hemoptysis,  No asthma or wheezing  Musculoskeletal:  [ ]  Arthritis, [ ]  Low back pain,  [ ]  Joint pain  Hematologic:No history of hypercoagulable state.  No history of easy bleeding.  No history of anemia  Gastrointestinal: No hematochezia or melena,  No gastroesophageal reflux, no trouble swallowing  Urinary: [ ]  chronic Kidney disease, [ ]  on HD - [ ]  MWF or [ ]  TTHS, [ ]  Burning with urination, [ ]  Frequent urination, [ ]  Difficulty urinating;   Skin: No rashes  Psychological: No history of anxiety,  No history of depression  Social History Social History   Tobacco Use   Smoking status: Every Day    Packs/day: 0.50    Types: Cigarettes   Smokeless tobacco: Never  Vaping Use   Vaping Use: Never used  Substance Use Topics   Alcohol use: No    Alcohol/week: 0.0 standard drinks    Comment: History of heavy alcohol use, most recent quit attempt has lasted 3 months, occassionally drinks alcohol   Drug use: No    Family History Family History  Problem Relation Age of Onset   Coronary artery disease Father    Coronary artery disease Sister        MI at age 65    Allergies  No Known Allergies   Current Outpatient Medications  Medication Sig Dispense Refill   aspirin EC 81 MG EC tablet Take 1 tablet (81 mg total) by mouth  daily at 6 (six) AM. Swallow whole. 30 tablet 11   clopidogrel (PLAVIX) 75 MG tablet TAKE ONE TABLET BY MOUTH ONCE DAILY. (Patient taking differently: Take 75 mg by mouth daily with supper.) 90 tablet 2   collagenase (SANTYL) ointment Apply 1 application topically daily. 30 g 1   HYDROcodone-acetaminophen (NORCO/VICODIN) 5-325 MG tablet Take 1 tablet by mouth every 4 (four) hours as needed for moderate pain. 10 tablet 0   metoprolol-hydrochlorothiazide (LOPRESSOR HCT) 50-25 MG tablet Take 1 tablet by mouth 3 (three) times daily.     Omega-3 Fatty Acids (FISH OIL) 1000 MG CAPS Take 1,000 mg by mouth daily.      rosuvastatin (CRESTOR) 10 MG tablet TAKE (1) TABLET BY MOUTH ONCE DAILY. 30 tablet 0   amLODipine (NORVASC) 5 MG tablet Take 0.5 tablets (2.5 mg total) by mouth  daily. 45 tablet 3   cephALEXin (KEFLEX) 500 MG capsule Take 1 capsule (500 mg total) by mouth 4 (four) times daily. (Patient not taking: Reported on 07/19/2021) 20 capsule 0   No current facility-administered medications for this visit.    Physical Examination  Vitals:   07/19/21 1326  BP: 138/82  Pulse: 90  Resp: 18  Temp: (!) 97.5 F (36.4 C)  TempSrc: Oral  SpO2: 100%  Weight: 126 lb 9.6 oz (57.4 kg)  Height: 5\' 8"  (1.727 m)    Body mass index is 19.25 kg/m.  General:  Alert and oriented, no acute distress HEENT: Normal Neck: No bruit or JVD Pulmonary: Clear to auscultation bilaterally Cardiac: Regular Rate and Rhythm without murmur Abdomen: Soft, non-tender, non-distended, no mass, no scars Skin: No rash       Extremity Pulses:  2+ radial, doppler dorsalis pedis, posterior tibial pulses bilaterally Musculoskeletal: right LE edema  Neurologic: Upper and lower extremity motor 5/5 and symmetric     ASSESSMENT:  PAD  s/p right iliofemoral endarterectomy with vein patch angioplasty, right femoral to AK popliteal bypass with PTFE (ringed), harvest of GSV and right CIA and EIA stent on 03/12/2021 by Dr.  Trula Slade and stent of the right EIA on 03/15/2021 by Dr. Donzetta Matters.   He has developed SS drainage from the 0.5 cm non healed right groin incision over the past few weeks.  Foot ulcer has healed completely.    The bypass in patent with doppler flow in the PT/DP/peroneal right LE.    PLAN: Dr. Trula Slade saw the patient in conjunction today and he will be scheduled for right groin incision and debridement with possible wound vac placement.  The patient and his wife agree with this plan.     Roxy Horseman PA-C [facility] [place] Office: [phone] Pager: [phone]

## 2021-07-20 NOTE — Addendum Note (Signed)
Addended by: Nicholas Lose on: 07/20/2021 09:25 AM   Modules accepted: Orders

## 2021-07-21 ENCOUNTER — Encounter (HOSPITAL_COMMUNITY): Payer: Self-pay | Admitting: Surgery

## 2021-07-21 ENCOUNTER — Other Ambulatory Visit: Payer: Self-pay

## 2021-07-21 NOTE — Progress Notes (Signed)
PCP - Dr. Gerarda Fraction  Cardiologist - Dr. Domenic Polite  EP- Denies  Endocrine- Denies  Pulm- Denies  Chest x-ray - Denies  EKG - 07/22/21- Day of surgery  Stress Test - Denies  ECHO - Denies  Cardiac Cath - Denies  AICD-na PM-na LOOP-na  Dialysis- Denies  Sleep Study - Denies CPAP - Denies  LABS- 07/22/21: CBC, CMP  ASA- Continue Plavix- LD- 1/22  ERAS- No  HA1C- Denies  Anesthesia- No  Pt denies having chest pain, sob, or fever during the pre-op phone call. All instructions explained to the pt, with a verbal understanding of the material including:  as of today, stop taking all Aspirin (unless instructed by your doctor) and Other Aspirin containing products, Vitamins, Fish oils, and Herbal medications. Also stop all NSAIDS i.e. Advil, Ibuprofen, Motrin, Aleve, Anaprox, Naproxen, BC, Goody Powders, and all Supplements.  Pt also instructed to wear a mask and social distance if he goes out. The opportunity to ask questions was provided.    Coronavirus Screening  Have you experienced the following symptoms:  Cough yes/no: No Fever (>100.27F)  yes/no: No Runny nose yes/no: No Sore throat yes/no: No Difficulty breathing/shortness of breath  yes/no: No  Have you or a family member traveled in the last 14 days and where? yes/no: No   If the patient indicates "YES" to the above questions, their PAT will be rescheduled to limit the exposure to others and, the surgeon will be notified. THE PATIENT WILL NEED TO BE ASYMPTOMATIC FOR 14 DAYS.   If the patient is not experiencing any of these symptoms, the PAT nurse will instruct them to NOT bring anyone with them to their appointment since they may have these symptoms or traveled as well.   Please remind your patients and families that hospital visitation restrictions are in effect and the importance of the restrictions.

## 2021-07-22 ENCOUNTER — Inpatient Hospital Stay (HOSPITAL_COMMUNITY): Payer: PPO | Admitting: Anesthesiology

## 2021-07-22 ENCOUNTER — Other Ambulatory Visit: Payer: Self-pay

## 2021-07-22 ENCOUNTER — Encounter (HOSPITAL_COMMUNITY): Payer: Self-pay | Admitting: Surgery

## 2021-07-22 ENCOUNTER — Encounter (HOSPITAL_COMMUNITY)
Admission: RE | Disposition: A | Discharge status: Home / Self Care | Payer: Self-pay | Source: Home / Self Care | Attending: Surgery

## 2021-07-22 ENCOUNTER — Inpatient Hospital Stay (HOSPITAL_COMMUNITY)
Admission: RE | Admit: 2021-07-22 | Discharge: 2021-07-24 | DRG: 921 | Disposition: A | Discharge status: Home / Self Care | Payer: PPO | Attending: Surgery | Admitting: Surgery

## 2021-07-22 DIAGNOSIS — I97648 Postprocedural seroma of a circulatory system organ or structure following other circulatory system procedure: Principal | ICD-10-CM | POA: Diagnosis present

## 2021-07-22 DIAGNOSIS — Z79899 Other long term (current) drug therapy: Secondary | ICD-10-CM

## 2021-07-22 DIAGNOSIS — M109 Gout, unspecified: Secondary | ICD-10-CM | POA: Diagnosis not present

## 2021-07-22 DIAGNOSIS — I4891 Unspecified atrial fibrillation: Secondary | ICD-10-CM | POA: Diagnosis not present

## 2021-07-22 DIAGNOSIS — L7634 Postprocedural seroma of skin and subcutaneous tissue following other procedure: Secondary | ICD-10-CM | POA: Diagnosis not present

## 2021-07-22 DIAGNOSIS — I10 Essential (primary) hypertension: Secondary | ICD-10-CM | POA: Diagnosis not present

## 2021-07-22 DIAGNOSIS — Z7902 Long term (current) use of antithrombotics/antiplatelets: Secondary | ICD-10-CM

## 2021-07-22 DIAGNOSIS — I739 Peripheral vascular disease, unspecified: Secondary | ICD-10-CM | POA: Diagnosis not present

## 2021-07-22 DIAGNOSIS — G8929 Other chronic pain: Secondary | ICD-10-CM | POA: Diagnosis present

## 2021-07-22 DIAGNOSIS — Z20822 Contact with and (suspected) exposure to covid-19: Secondary | ICD-10-CM | POA: Diagnosis present

## 2021-07-22 DIAGNOSIS — Z7982 Long term (current) use of aspirin: Secondary | ICD-10-CM

## 2021-07-22 DIAGNOSIS — M1990 Unspecified osteoarthritis, unspecified site: Secondary | ICD-10-CM | POA: Diagnosis present

## 2021-07-22 DIAGNOSIS — M549 Dorsalgia, unspecified: Secondary | ICD-10-CM | POA: Diagnosis present

## 2021-07-22 DIAGNOSIS — F17210 Nicotine dependence, cigarettes, uncomplicated: Secondary | ICD-10-CM | POA: Diagnosis not present

## 2021-07-22 DIAGNOSIS — Z8249 Family history of ischemic heart disease and other diseases of the circulatory system: Secondary | ICD-10-CM | POA: Diagnosis not present

## 2021-07-22 HISTORY — PX: APPLICATION OF WOUND VAC: SHX5189

## 2021-07-22 HISTORY — PX: GROIN DEBRIDEMENT: SHX5159

## 2021-07-22 LAB — POCT I-STAT, CHEM 8
BUN: 16 mg/dL (ref 8–23)
Calcium, Ion: 0.92 mmol/L — ABNORMAL LOW (ref 1.15–1.40)
Chloride: 108 mmol/L (ref 98–111)
Creatinine, Ser: 0.7 mg/dL (ref 0.61–1.24)
Glucose, Bld: 89 mg/dL (ref 70–99)
HCT: 38 % — ABNORMAL LOW (ref 39.0–52.0)
Hemoglobin: 12.9 g/dL — ABNORMAL LOW (ref 13.0–17.0)
Potassium: 3.9 mmol/L (ref 3.5–5.1)
Sodium: 138 mmol/L (ref 135–145)
TCO2: 24 mmol/L (ref 22–32)

## 2021-07-22 LAB — CBC
HCT: 38.2 % — ABNORMAL LOW (ref 39.0–52.0)
Hemoglobin: 11.9 g/dL — ABNORMAL LOW (ref 13.0–17.0)
MCH: 29.1 pg (ref 26.0–34.0)
MCHC: 31.2 g/dL (ref 30.0–36.0)
MCV: 93.4 fL (ref 80.0–100.0)
Platelets: 224 10*3/uL (ref 150–400)
RBC: 4.09 MIL/uL — ABNORMAL LOW (ref 4.22–5.81)
RDW: 13.9 % (ref 11.5–15.5)
WBC: 8.2 10*3/uL (ref 4.0–10.5)
nRBC: 0 % (ref 0.0–0.2)

## 2021-07-22 LAB — CREATININE, SERUM
Creatinine, Ser: 0.81 mg/dL (ref 0.61–1.24)
GFR, Estimated: >60 mL/min (ref >60)

## 2021-07-22 LAB — SARS CORONAVIRUS 2 BY RT PCR (HOSPITAL ORDER, PERFORMED IN ~~LOC~~ HOSPITAL LAB): SARS Coronavirus 2: NEGATIVE

## 2021-07-22 SURGERY — DEBRIDEMENT, INGUINAL REGION
Anesthesia: General | Site: Groin | Laterality: Right

## 2021-07-22 MED ORDER — SODIUM CHLORIDE 0.9 % IV SOLN: INTRAVENOUS | Status: DC

## 2021-07-22 MED ORDER — LIDOCAINE 2% (20 MG/ML) 5 ML SYRINGE
INTRAMUSCULAR | Status: AC
  Filled 2021-07-22: qty 10

## 2021-07-22 MED ORDER — METOPROLOL TARTRATE 5 MG/5ML IV SOLN: 2.0000 mg | INTRAVENOUS | Status: DC | PRN

## 2021-07-22 MED ORDER — GUAIFENESIN-DM 100-10 MG/5ML PO SYRP: 15.0000 mL | ORAL_SOLUTION | ORAL | Status: DC | PRN

## 2021-07-22 MED ORDER — OXYCODONE-ACETAMINOPHEN 5-325 MG PO TABS
ORAL_TABLET | ORAL | Status: AC
  Filled 2021-07-22: qty 2

## 2021-07-22 MED ORDER — CHLORHEXIDINE GLUCONATE 4 % EX LIQD: 60.0000 mL | Freq: Once | CUTANEOUS | Status: DC

## 2021-07-22 MED ORDER — MAGNESIUM SULFATE 2 GM/50ML IV SOLN: 2.0000 g | Freq: Every day | INTRAVENOUS | Status: DC | PRN

## 2021-07-22 MED ORDER — AMLODIPINE BESYLATE 2.5 MG PO TABS
2.5000 mg | ORAL_TABLET | Freq: Every day | ORAL | Status: DC
  Administered 2021-07-23 – 2021-07-24 (×2): 2.5 mg via ORAL
  Filled 2021-07-22 (×2): qty 1

## 2021-07-22 MED ORDER — PHENOL 1.4 % MT LIQD: 1.0000 | OROMUCOSAL | Status: DC | PRN

## 2021-07-22 MED ORDER — FENTANYL CITRATE (PF) 250 MCG/5ML IJ SOLN
INTRAMUSCULAR | Status: AC
  Filled 2021-07-22: qty 5

## 2021-07-22 MED ORDER — SODIUM CHLORIDE 0.9 % IV SOLN: 500.0000 mL | Freq: Once | INTRAVENOUS | Status: DC | PRN

## 2021-07-22 MED ORDER — FENTANYL CITRATE (PF) 100 MCG/2ML IJ SOLN
25.0000 ug | INTRAMUSCULAR | Status: DC | PRN
  Administered 2021-07-22 (×2): 50 ug via INTRAVENOUS

## 2021-07-22 MED ORDER — ALUM & MAG HYDROXIDE-SIMETH 200-200-20 MG/5ML PO SUSP: 15.0000 mL | ORAL | Status: DC | PRN

## 2021-07-22 MED ORDER — LABETALOL HCL 5 MG/ML IV SOLN: 10.0000 mg | INTRAVENOUS | Status: DC | PRN

## 2021-07-22 MED ORDER — PROPOFOL 10 MG/ML IV BOLUS
INTRAVENOUS | Status: DC | PRN
Start: 2021-07-22 — End: 2021-07-22
  Administered 2021-07-22: 100 mg via INTRAVENOUS

## 2021-07-22 MED ORDER — FENTANYL CITRATE (PF) 100 MCG/2ML IJ SOLN
INTRAMUSCULAR | Status: AC
  Filled 2021-07-22: qty 2

## 2021-07-22 MED ORDER — DEXAMETHASONE SODIUM PHOSPHATE 10 MG/ML IJ SOLN
INTRAMUSCULAR | Status: AC
  Filled 2021-07-22: qty 2

## 2021-07-22 MED ORDER — FENTANYL CITRATE (PF) 250 MCG/5ML IJ SOLN
INTRAMUSCULAR | Status: DC | PRN
  Administered 2021-07-22: 50 ug via INTRAVENOUS

## 2021-07-22 MED ORDER — ACETAMINOPHEN 650 MG RE SUPP: 325.0000 mg | RECTAL | Status: DC | PRN

## 2021-07-22 MED ORDER — OXYCODONE-ACETAMINOPHEN 5-325 MG PO TABS
1.0000 | ORAL_TABLET | ORAL | Status: DC | PRN
  Administered 2021-07-22 – 2021-07-24 (×4): 2 via ORAL
  Filled 2021-07-22 (×3): qty 2

## 2021-07-22 MED ORDER — DOCUSATE SODIUM 100 MG PO CAPS
100.0000 mg | ORAL_CAPSULE | Freq: Every day | ORAL | Status: DC
  Administered 2021-07-23 – 2021-07-24 (×2): 100 mg via ORAL
  Filled 2021-07-22 (×2): qty 1

## 2021-07-22 MED ORDER — ORAL CARE MOUTH RINSE: 15.0000 mL | Freq: Once | OROMUCOSAL | Status: AC

## 2021-07-22 MED ORDER — ACETAMINOPHEN 10 MG/ML IV SOLN: 1000.0000 mg | Freq: Once | INTRAVENOUS | Status: DC | PRN

## 2021-07-22 MED ORDER — CLOPIDOGREL BISULFATE 75 MG PO TABS
75.0000 mg | ORAL_TABLET | Freq: Every day | ORAL | Status: DC
  Administered 2021-07-23: 75 mg via ORAL
  Filled 2021-07-22: qty 1

## 2021-07-22 MED ORDER — ONDANSETRON HCL 4 MG/2ML IJ SOLN
INTRAMUSCULAR | Status: AC
  Filled 2021-07-22: qty 4

## 2021-07-22 MED ORDER — LIDOCAINE 2% (20 MG/ML) 5 ML SYRINGE
INTRAMUSCULAR | Status: DC | PRN
  Administered 2021-07-22: 30 mg via INTRAVENOUS

## 2021-07-22 MED ORDER — ROCURONIUM BROMIDE 10 MG/ML (PF) SYRINGE
PREFILLED_SYRINGE | INTRAVENOUS | Status: AC
  Filled 2021-07-22: qty 10

## 2021-07-22 MED ORDER — 0.9 % SODIUM CHLORIDE (POUR BTL) OPTIME
TOPICAL | Status: DC | PRN
  Administered 2021-07-22: 1000 mL

## 2021-07-22 MED ORDER — MORPHINE SULFATE (PF) 2 MG/ML IV SOLN
2.0000 mg | INTRAVENOUS | Status: DC | PRN
  Filled 2021-07-22: qty 1

## 2021-07-22 MED ORDER — CHLORHEXIDINE GLUCONATE 0.12 % MT SOLN
OROMUCOSAL | Status: AC
  Administered 2021-07-22: 15 mL via OROMUCOSAL
  Filled 2021-07-22: qty 15

## 2021-07-22 MED ORDER — CEFAZOLIN SODIUM-DEXTROSE 2-4 GM/100ML-% IV SOLN
2.0000 g | INTRAVENOUS | Status: AC
  Administered 2021-07-22: 2 g via INTRAVENOUS
  Filled 2021-07-22: qty 100

## 2021-07-22 MED ORDER — PHENYLEPHRINE 40 MCG/ML (10ML) SYRINGE FOR IV PUSH (FOR BLOOD PRESSURE SUPPORT)
PREFILLED_SYRINGE | INTRAVENOUS | Status: AC
  Filled 2021-07-22: qty 20

## 2021-07-22 MED ORDER — PANTOPRAZOLE SODIUM 40 MG PO TBEC
40.0000 mg | DELAYED_RELEASE_TABLET | Freq: Every day | ORAL | Status: DC
  Administered 2021-07-22 – 2021-07-24 (×3): 40 mg via ORAL
  Filled 2021-07-22 (×3): qty 1

## 2021-07-22 MED ORDER — ASPIRIN EC 81 MG PO TBEC: 81.0000 mg | DELAYED_RELEASE_TABLET | Freq: Every day | ORAL | Status: DC

## 2021-07-22 MED ORDER — ACETAMINOPHEN 325 MG PO TABS: 325.0000 mg | ORAL_TABLET | ORAL | Status: DC | PRN

## 2021-07-22 MED ORDER — HYDRALAZINE HCL 20 MG/ML IJ SOLN: 5.0000 mg | INTRAMUSCULAR | Status: DC | PRN

## 2021-07-22 MED ORDER — CHLORHEXIDINE GLUCONATE 0.12 % MT SOLN: 15.0000 mL | Freq: Once | OROMUCOSAL | Status: AC

## 2021-07-22 MED ORDER — SENNOSIDES-DOCUSATE SODIUM 8.6-50 MG PO TABS: 1.0000 | ORAL_TABLET | Freq: Every evening | ORAL | Status: DC | PRN

## 2021-07-22 MED ORDER — POTASSIUM CHLORIDE CRYS ER 20 MEQ PO TBCR: 20.0000 meq | EXTENDED_RELEASE_TABLET | Freq: Every day | ORAL | Status: DC | PRN

## 2021-07-22 MED ORDER — ONDANSETRON HCL 4 MG/2ML IJ SOLN: 4.0000 mg | Freq: Four times a day (QID) | INTRAMUSCULAR | Status: DC | PRN

## 2021-07-22 MED ORDER — ROSUVASTATIN CALCIUM 5 MG PO TABS
10.0000 mg | ORAL_TABLET | Freq: Every day | ORAL | Status: DC
  Administered 2021-07-22 – 2021-07-24 (×3): 10 mg via ORAL
  Filled 2021-07-22 (×4): qty 2

## 2021-07-22 MED ORDER — HEPARIN SODIUM (PORCINE) 5000 UNIT/ML IJ SOLN
5000.0000 [IU] | Freq: Three times a day (TID) | INTRAMUSCULAR | Status: DC
  Filled 2021-07-22 (×2): qty 1

## 2021-07-22 MED ORDER — PHENYLEPHRINE HCL-NACL 20-0.9 MG/250ML-% IV SOLN
INTRAVENOUS | Status: DC | PRN
  Administered 2021-07-22: 75 ug/min via INTRAVENOUS

## 2021-07-22 MED ORDER — CEFAZOLIN SODIUM-DEXTROSE 2-4 GM/100ML-% IV SOLN
2.0000 g | Freq: Three times a day (TID) | INTRAVENOUS | Status: AC
  Administered 2021-07-22 – 2021-07-23 (×2): 2 g via INTRAVENOUS
  Filled 2021-07-22 (×2): qty 100

## 2021-07-22 MED ORDER — LACTATED RINGERS IV SOLN: INTRAVENOUS | Status: DC

## 2021-07-22 SURGICAL SUPPLY — 42 items
BAG COUNTER SPONGE SURGICOUNT (BAG) ×3 IMPLANT
CANISTER SUCT 3000ML PPV (MISCELLANEOUS) ×3 IMPLANT
CANISTER WOUNDNEG PRESSURE 500 (CANNISTER) ×1 IMPLANT
COVER SURGICAL LIGHT HANDLE (MISCELLANEOUS) ×3 IMPLANT
DRAPE INCISE IOBAN 66X45 STRL (DRAPES) ×3 IMPLANT
DRAPE ORTHO SPLIT 77X108 STRL (DRAPES) ×3
DRAPE SURG ORHT 6 SPLT 77X108 (DRAPES) ×2 IMPLANT
DRSG VAC ATS LRG SENSATRAC (GAUZE/BANDAGES/DRESSINGS) ×6 IMPLANT
DRSG VAC ATS MED SENSATRAC (GAUZE/BANDAGES/DRESSINGS) IMPLANT
DRSG VAC ATS SM SENSATRAC (GAUZE/BANDAGES/DRESSINGS) ×1 IMPLANT
ELECT REM PT RETURN 9FT ADLT (ELECTROSURGICAL) ×3
ELECTRODE REM PT RTRN 9FT ADLT (ELECTROSURGICAL) ×2 IMPLANT
GAUZE SPONGE 4X4 12PLY STRL (GAUZE/BANDAGES/DRESSINGS) ×3 IMPLANT
GLOVE SRG 8 PF TXTR STRL LF DI (GLOVE) ×2 IMPLANT
GLOVE SURG ENC MOIS LTX SZ7.5 (GLOVE) ×3 IMPLANT
GLOVE SURG MICRO LTX SZ7 (GLOVE) ×3 IMPLANT
GLOVE SURG POLY MICRO LF SZ6 (GLOVE) ×1 IMPLANT
GLOVE SURG POLYISO LF SZ6.5 (GLOVE) ×2 IMPLANT
GLOVE SURG POLYISO LF SZ7.5 (GLOVE) ×1 IMPLANT
GLOVE SURG UNDER POLY LF SZ7 (GLOVE) ×1 IMPLANT
GLOVE SURG UNDER POLY LF SZ8 (GLOVE) ×3
GOWN STRL REUS W/ TWL LRG LVL3 (GOWN DISPOSABLE) ×6 IMPLANT
GOWN STRL REUS W/ TWL XL LVL3 (GOWN DISPOSABLE) ×4 IMPLANT
GOWN STRL REUS W/TWL LRG LVL3 (GOWN DISPOSABLE) ×12
GOWN STRL REUS W/TWL XL LVL3 (GOWN DISPOSABLE) ×6
KIT BASIN OR (CUSTOM PROCEDURE TRAY) ×3 IMPLANT
KIT TURNOVER KIT B (KITS) ×3 IMPLANT
NS IRRIG 1000ML POUR BTL (IV SOLUTION) ×3 IMPLANT
PACK GENERAL/GYN (CUSTOM PROCEDURE TRAY) ×3 IMPLANT
PACK UNIVERSAL I (CUSTOM PROCEDURE TRAY) ×3 IMPLANT
PAD ARMBOARD 7.5X6 YLW CONV (MISCELLANEOUS) ×6 IMPLANT
SPONGE T-LAP 18X18 ~~LOC~~+RFID (SPONGE) ×2 IMPLANT
STAPLER VISISTAT 35W (STAPLE) IMPLANT
SUT ETHILON 2 0 PSLX (SUTURE) IMPLANT
SUT ETHILON 3 0 PS 1 (SUTURE) IMPLANT
SUT VIC AB 2-0 CTX 36 (SUTURE) IMPLANT
SUT VIC AB 3-0 SH 27 (SUTURE) ×6
SUT VIC AB 3-0 SH 27X BRD (SUTURE) IMPLANT
SWAB CULTURE ESWAB REG 1ML (MISCELLANEOUS) ×1 IMPLANT
SWAB CULTURE LIQ STUART DBL (MISCELLANEOUS) ×1 IMPLANT
TOWEL GREEN STERILE (TOWEL DISPOSABLE) ×3 IMPLANT
WATER STERILE IRR 1000ML POUR (IV SOLUTION) ×3 IMPLANT

## 2021-07-22 NOTE — Plan of Care (Signed)
°  Problem: Education: Goal: Knowledge of General Education information will improve Description: Including pain rating scale, medication(s)/side effects and non-pharmacologic comfort measures Outcome: Progressing   Problem: Health Behavior/Discharge Planning: Goal: Ability to manage health-related needs will improve Outcome: Progressing   Problem: Clinical Measurements: Goal: Ability to maintain clinical measurements within normal limits will improve Outcome: Progressing Goal: Will remain free from infection Outcome: Progressing Goal: Diagnostic test results will improve Outcome: Progressing Goal: Respiratory complications will improve Outcome: Progressing Goal: Cardiovascular complication will be avoided Outcome: Progressing   Problem: Coping: Goal: Level of anxiety will decrease Outcome: Progressing   Problem: Pain Managment: Goal: General experience of comfort will improve Outcome: Progressing   Problem: Safety: Goal: Ability to remain free from injury will improve Outcome: Progressing   

## 2021-07-22 NOTE — Progress Notes (Signed)
Developed leak in vac dressing / additional pieces applied, with help from [place], Larkin Ina ? Leak resolved and restarted vac at 150 mmhg continuous / appears to be working well after reinforcement

## 2021-07-22 NOTE — Progress Notes (Addendum)
Pt arrived to 4E from PACU. Tele applied; CCMD called. CHG. VSS. Oriented to room. Call light in reach. VAC at 150. Moderate bleeding noted at groin site. Paged VVS PA. PA at bedside.  Raelyn Number, RN

## 2021-07-22 NOTE — Anesthesia Postprocedure Evaluation (Signed)
Anesthesia Post Note  Patient: Todd [name]  Procedure(s) Performed: RIGHT GROIN DEBRIDEMENT (Right) APPLICATION OF WOUND VAC (Right: Groin)     Patient location during evaluation: PACU Anesthesia Type: General Level of consciousness: awake and alert Pain management: pain level controlled Vital Signs Assessment: post-procedure vital signs reviewed and stable Respiratory status: spontaneous breathing, nonlabored ventilation, respiratory function stable and patient connected to nasal cannula oxygen Cardiovascular status: blood pressure returned to baseline and stable Postop Assessment: no apparent nausea or vomiting Anesthetic complications: no   No notable events documented.  Last Vitals:  Vitals:   07/22/21 1211 07/22/21 1427  BP: 123/77 126/74  Pulse: 73   Resp: (!) 8   Temp:  36.8 C  SpO2: 99% 99%    Last Pain:  Vitals:   07/22/21 1427  TempSrc: Oral  PainSc:                  March Rummage Palyn Scrima

## 2021-07-22 NOTE — Progress Notes (Signed)
° °  I was called to evaluate right groin of Mr. Hilleary.  He underwent I&D of right groin and of vein harvest site.  He was bleeding around the Lilly pad of the wound VAC.  Black sponge seem saturated with blood.  The sponge was cut into in the mid thigh and a second Lilly pad was Y connected into the wound VAC.  Seal was achieved immediately.  There was no further bleeding from wound VAC dressing at completion.  Dagoberto Ligas, PA-C [facility] [phone] 07/22/2021  3:40 PM

## 2021-07-22 NOTE — Op Note (Addendum)
° ° °  Patient name: Todd [name] MRN: [id number] DOB: 1950/09/16 Sex: male  07/22/2021 Pre-operative Diagnosis: Right groin seroma Post-operative diagnosis:  Same Surgeon:  Annamarie Major Assistants:  Ivin Booty, PA, Sabino Dick, MS III Procedure:   #1: I&D of right groin, and vein harvest site   #2: Placement of wound VAC. Anesthesia:  General Blood Loss:  minimal Specimens:  cultures were sent  Findings: The femoral-popliteal graft was not exposed in the groin.  There was no obvious purulence.  Cultures of the fluid were sent  Indications: This is a 71 year old gentleman who has previously undergone femoral endarterectomy and femoropopliteal bypass graft.  He has had persistent lymphatic drainage from his groin and comes in today for exploration.  Procedure:  The patient was identified in the holding area and taken to [place] [facility]  The patient was then placed supine on the table. general anesthesia was administered.  The patient was prepped and draped in the usual sterile fashion.  A time out was called and antibiotics were administered.  A PA was necessary to assist with exposure through suctioning and counter retraction.  The PA was also necessary for application of the wound VAC.  I initially began by opening the previous longitudinal groin incision with a 10 blade.  Cautery was used divide subcutaneous tissue.  Blunt dissection was then used to open up the space.  The graft was never exposed.  There was no obvious lymphatic drainage.  The wound was then irrigated and cultures were sent.  The patient had 2 areas of drainage from vein harvest incisions.  These were open for approximately 1.5 cm each.  They did not really tunnel anywhere.  At this point I elected to place a wound VAC over the incisions.  Before doing so in the groin, I closed the subcutaneous tissue with 3-0 Vicryl.  I then placed wound VAC on all 3 incisions.  The groin incision measured approximately 2 x 1 x 0.5 cm.   The other 2 spots on the leg or approximately 1 cm x 0.5 cm x 0.5cm After placement of the wound VAC, the patient was successfully extubated and taken to recovery in stable condition.   Disposition: To PACU stable.   Theotis Burrow, M.D., [facility] [place] Office: [phone] Pager:  [phone]

## 2021-07-22 NOTE — Anesthesia Preprocedure Evaluation (Addendum)
Anesthesia Evaluation  Patient identified by MRN, date of birth, ID band Patient awake    Reviewed: Allergy & Precautions, NPO status , Patient's Chart, lab work & pertinent test results  Airway Mallampati: II  TM Distance: >3 FB Neck ROM: Full    Dental  (+) Partial Upper, Missing, Poor Dentition   Pulmonary neg pulmonary ROS, Current Smoker,    Pulmonary exam normal        Cardiovascular hypertension, Pt. on medications + Peripheral Vascular Disease (on Plavix)  + dysrhythmias Atrial Fibrillation  Rhythm:Irregular Rate:Normal     Neuro/Psych negative neurological ROS  negative psych ROS   GI/Hepatic negative GI ROS, Neg liver ROS,   Endo/Other  negative endocrine ROS  Renal/GU negative Renal ROS  negative genitourinary   Musculoskeletal  (+) Arthritis , Osteoarthritis,  Right groin wound   Abdominal Normal abdominal exam  (+)   Peds  Hematology negative hematology ROS (+)   Anesthesia Other Findings   Reproductive/Obstetrics                            Anesthesia Physical Anesthesia Plan  ASA: 3  Anesthesia Plan: General   Post-op Pain Management:    Induction: Intravenous  PONV Risk Score and Plan: 1 and Ondansetron, Dexamethasone and Treatment may vary due to age or medical condition  Airway Management Planned: Mask and LMA  Additional Equipment: None  Intra-op Plan:   Post-operative Plan: Extubation in OR  Informed Consent: I have reviewed the patients History and Physical, chart, labs and discussed the procedure including the risks, benefits and alternatives for the proposed anesthesia with the patient or authorized representative who has indicated his/her understanding and acceptance.     Dental advisory given  Plan Discussed with: CRNA  Anesthesia Plan Comments: (Lab Results      Component                Value               Date                      WBC                       11.2 (H)            03/19/2021                HGB                      12.9 (L)            07/22/2021                HCT                      38.0 (L)            07/22/2021                MCV                      90.3                03/19/2021                PLT  293                 03/19/2021           Lab Results      Component                Value               Date                      NA                       138                 07/22/2021                K                        3.9                 07/22/2021                CO2                      27                  03/16/2021                GLUCOSE                  89                  07/22/2021                BUN                      16                  07/22/2021                CREATININE               0.70                07/22/2021                CALCIUM                  7.5 (L)             03/16/2021                GFRNONAA                 >60                 03/16/2021          )        Anesthesia Quick Evaluation

## 2021-07-22 NOTE — Interval H&P Note (Signed)
History and Physical Interval Note:  07/22/2021 10:04 AM  Todd [name]  has presented today for surgery, with the diagnosis of Disruption of external surgical wound.  The various methods of treatment have been discussed with the patient and family. After consideration of risks, benefits and other options for treatment, the patient has consented to  Procedure(s): RIGHT GROIN DEBRIDEMENT (Right) POSSIBLE APPLICATION OF WOUND VAC (Right) as a surgical intervention.  The patient's history has been reviewed, patient examined, no change in status, stable for surgery.  I have reviewed the patient's chart and labs.  Questions were answered to the patient's satisfaction.     Annamarie Major

## 2021-07-22 NOTE — Progress Notes (Addendum)
R groin wound vac dressing noted to have bleed consistent w/ prior report. 349mLs bloody drainage in canister. The seal is intact and there is no leak.  There is some oozing around R groin wound vac site contained to upper portion of dressing, however pt states it is improved from before dressing change from PA. The thigh area where the second track pad is remains CDI.   I offered to change the upper portion of dressing- pt would like it left alone unless bleed increases or wound vac stops working. Will continue to closely monitor.  Jaymes Graff, RN  0600: reassessed vac- total drainage in cannister 479mLs. No further bleed from upper portion of dressing, dried blood is underneath dressing. The bottom portion of thigh dressing seems to be saturated with blood, though the wound vac still w/ good seal. Ordered dressing supplies for possible change.

## 2021-07-22 NOTE — Transfer of Care (Signed)
Immediate Anesthesia Transfer of Care Note  Patient: Todd [name]  Procedure(s) Performed: RIGHT GROIN DEBRIDEMENT (Right) APPLICATION OF WOUND VAC (Right: Groin)  Patient Location: PACU  Anesthesia Type:General  Level of Consciousness: awake and alert   Airway & Oxygen Therapy: Patient Spontanous Breathing  Post-op Assessment: Report given to RN and Post -op Vital signs reviewed and stable  Post vital signs: Reviewed and stable  Last Vitals:  Vitals Value Taken Time  BP 120/84   Temp    Pulse 78   Resp    SpO2 99     Last Pain:  Vitals:   07/22/21 0702  TempSrc:   PainSc: 0-No pain      Patients Stated Pain Goal: 2 (19/75/88 3254)  Complications: No notable events documented.

## 2021-07-22 NOTE — Anesthesia Procedure Notes (Signed)
Procedure Name: LMA Insertion Date/Time: 07/22/2021 10:55 AM Performed by: Eligha Bridegroom, CRNA Pre-anesthesia Checklist: Emergency Drugs available, Suction available, Patient identified, Patient being monitored and Timeout performed Patient Re-evaluated:Patient Re-evaluated prior to induction Preoxygenation: Pre-oxygenation with 100% oxygen Induction Type: IV induction LMA: LMA inserted LMA Size: 4.0 Number of attempts: 1 Placement Confirmation: positive ETCO2 and breath sounds checked- equal and bilateral Tube secured with: Tape Dental Injury: Teeth and Oropharynx as per pre-operative assessment

## 2021-07-23 ENCOUNTER — Encounter (HOSPITAL_COMMUNITY): Payer: Self-pay | Admitting: Surgery

## 2021-07-23 LAB — LIPID PANEL
Cholesterol: 103 mg/dL (ref 0–200)
HDL: 56 mg/dL (ref >40)
LDL Cholesterol: 39 mg/dL (ref 0–99)
Total CHOL/HDL Ratio: 1.8 RATIO
Triglycerides: 39 mg/dL (ref <150)
VLDL: 8 mg/dL (ref 0–40)

## 2021-07-23 LAB — HEPATIC FUNCTION PANEL
ALT: 6 U/L (ref 0–44)
AST: 14 U/L — ABNORMAL LOW (ref 15–41)
Albumin: 2.7 g/dL — ABNORMAL LOW (ref 3.5–5.0)
Alkaline Phosphatase: 53 U/L (ref 38–126)
Bilirubin, Direct: <0.1 mg/dL (ref 0.0–0.2)
Total Bilirubin: 0.4 mg/dL (ref 0.3–1.2)
Total Protein: 5.8 g/dL — ABNORMAL LOW (ref 6.5–8.1)

## 2021-07-23 MED ORDER — ASPIRIN EC 81 MG PO TBEC
81.0000 mg | DELAYED_RELEASE_TABLET | Freq: Every day | ORAL | Status: DC
  Administered 2021-07-23 – 2021-07-24 (×2): 81 mg via ORAL
  Filled 2021-07-23 (×2): qty 1

## 2021-07-23 MED ORDER — HYDROCODONE-ACETAMINOPHEN 5-325 MG PO TABS: 1.0000 | ORAL_TABLET | Freq: Four times a day (QID) | ORAL | 0 refills | Status: DC | PRN

## 2021-07-23 NOTE — Progress Notes (Signed)
Vac intermittently alarming 'blockage.'  Distal site appears clotted off/not draining.  2 RNs changed lilly pad on distal site with apparent resumption of full suction.  Therapy on at 130mmHg.  Will cont plan of care.

## 2021-07-23 NOTE — TOC Transition Note (Signed)
Transition of Care (TOC) - CM/SW Discharge Note Marvetta Gibbons RN, BSN Transitions of Care Unit 4E- RN Case Manager See Treatment Team for direct phone #    Patient Details  Name: Todd [name] MRN: [id number] Date of Birth: 09-08-1950  Transition of Care [facility]) CM/SW Contact:  Dawayne Patricia, RN Phone Number: 07/23/2021, 3:53 PM   Clinical Narrative:    Per vascular pt stable for transition home today if [facility] and home  wound VAC can be secured. Orders have been placed for [facility], and [facility] home VAC order has been signed and faxed.  Spoke with Olivia Mackie at [facility] who is hopeful for insurance appproval today- and can use Ready VAC to get pt home later today.   Spoke with pt and spouse at bedside to discuss home VAC arrangements and pending approval. Also discussed HHRN needs- and list provided for choice Per CMS guidelines from medicare.gov website with star ratings (copy placed in shadow chart) - per pt/wife they have used [facility] recently and would like to use them again. Call made to [facility] with [facility] for [facility] referral with wound vAC needs- Lattie Haw to return call if they can accept for needed services.   1400- received call back from [place] with [facility] and referral has been accepted for [facility] needs (home wound VAC)- will plan to see pt on Monday for in home drsg change.   [place] with Olivia Mackie at [facility] on home [facility] approval- approval still pending at this time.    Final next level of care: [place] Barriers to Discharge: Equipment Delay   Patient Goals and CMS Choice Patient states their goals for this hospitalization and ongoing recovery are:: return home CMS Medicare.gov Compare Post Acute Care list provided to:: Patient Choice offered to / list presented to : Patient, Spouse  Discharge Placement               Home w/ [facility]        Discharge Plan and Services   Discharge Planning Services: CM Consult            DME Arranged: Vac DME Agency: [employer] Date DME  Agency Contacted: 07/23/21 Time DME Agency Contacted: 102 Representative spoke with at DME Agency: Olivia Mackie HH Arranged: RN [facility] Agency: [place] Date [place]: 07/23/21 Time [place]: 23 Representative spoke with at [place]: Lattie Haw  Social Determinants of Health ([place]) Interventions     Readmission Risk Interventions Readmission Risk Prevention Plan 07/23/2021 03/16/2021  Post Dischage Appt Complete Complete  Medication Screening Complete Complete  Transportation Screening Complete Complete  Some recent data might be hidden

## 2021-07-23 NOTE — Progress Notes (Addendum)
Progress Note    07/23/2021 7:26 AM 1 Day Post-Op  Subjective:  pt wants to go home.  Afebrile HR 70's-90's afib 638'G-665'L systolic 93% RA   Vitals:   07/22/21 2311 07/23/21 0335  BP: 121/84 120/83  Pulse:  88  Resp: 16 17  Temp: (!) 97.3 F (36.3 C) (!) 97.5 F (36.4 C)  SpO2: 98% 99%    Physical Exam: General:  no distress Lungs:  non labored Incisions:  right groin wound vac with good seal with 2nd lily pad Extremities:  bilateral feet are warm and well perfused.   CBC    Component Value Date/Time   WBC 8.2 07/22/2021 1523   RBC 4.09 (L) 07/22/2021 1523   HGB 11.9 (L) 07/22/2021 1523   HCT 38.2 (L) 07/22/2021 1523   PLT 224 07/22/2021 1523   MCV 93.4 07/22/2021 1523   MCH 29.1 07/22/2021 1523   MCHC 31.2 07/22/2021 1523   RDW 13.9 07/22/2021 1523   LYMPHSABS 1.3 05/26/2010 1427   MONOABS 0.5 05/26/2010 1427   EOSABS 0.0 05/26/2010 1427   BASOSABS 0.0 05/26/2010 1427    BMET    Component Value Date/Time   NA 138 07/22/2021 0733   K 3.9 07/22/2021 0733   CL 108 07/22/2021 0733   CO2 27 03/16/2021 0129   GLUCOSE 89 07/22/2021 0733   BUN 16 07/22/2021 0733   CREATININE 0.81 07/22/2021 1523   CALCIUM 7.5 (L) 03/16/2021 0129   GFRNONAA >60 07/22/2021 1523   GFRAA  10/06/2010 1259    >60        The eGFR has been calculated using the MDRD equation. This calculation has not been validated in all clinical situations. eGFR's persistently <60 mL/min signify possible Chronic Kidney Disease.    INR    Component Value Date/Time   INR 1.0 03/11/2021 1112     Intake/Output Summary (Last 24 hours) at 07/23/2021 0726 Last data filed at 07/23/2021 0600 Gross per 24 hour  Intake 1560 ml  Output 1250 ml  Net 310 ml   Specimen Description WOUND   Special Requests RIGHT GROIN WOUND   Gram Stain NO WBC SEEN  NO ORGANISMS SEEN  Performed at [facility], [address] [place], [place] [address]   Culture PENDING   Report Status PENDING      Assessment/Plan:  71 y.o. male is s/p:  I&D right groin and vein harvest site and placement of wound vac  1 Day Post-Op   -pt doing well this morning.  Wound vac much better since 2nd lily pad placed yesterday.   -wound vac with good seal.  Pt states RN marked cannister last night around 7pm and there is minimal drainage since that mark.   -gram stain with no organisms-culture pending -hgb only down slightly from 12.9 to 11.9.  no leukocytosis -afib-pt not on coumadin due to hx of GIB -continue asa/plavix/statin -will get [facility] consult for home wound vac-measurements are in op note.  Face to face placed. -DVT prophylaxis:  sq heparin   Leontine Locket, PA-C [facility] [phone] 07/23/2021 7:26 AM  I agree with the above.  I have seen and evaluated the patient.  We will change his wound VAC today and plan on discharge home with follow-up next week in the office.  Annamarie Major

## 2021-07-23 NOTE — Discharge Summary (Addendum)
Discharge Summary    Todd [name] [birth year] 71 y.o. male  [id number]  Admission Date: 07/22/2021  Discharge Date: 07/24/2021  Physician: Serafina Mitchell, MD  Admission Diagnosis: PAD (peripheral artery disease) ([place]) [I73.9]   HPI:   This is a 71 y.o. male who has previously undergone femoral endarterectomy and femoropopliteal bypass graft.  He has had persistent lymphatic drainage from his groin and comes in today for exploration.  Hospital Course:  The patient was admitted to the hospital and taken to the operating room on 07/22/2021 and underwent: I&D of right groin and vein harvest site with placement of wound vac.    Findings: The femoral-popliteal graft was not exposed in the groin.  There was no obvious purulence.  Cultures of the fluid were sent  The pt tolerated the procedure well and was transported to the PACU in good condition.   By POD 1, pt was doing well.  Vac was changed prior to discharge.  HHRN arranged for home vac changes.  He was not able to discharge home due to not being able to get in contact with insurance company.    POD 2, pt doing well and vacs with good seal.  Plan for discharge today and [place] will f/u with pt on Monday.     CBC    Component Value Date/Time   WBC 8.2 07/22/2021 1523   RBC 4.09 (L) 07/22/2021 1523   HGB 11.9 (L) 07/22/2021 1523   HCT 38.2 (L) 07/22/2021 1523   PLT 224 07/22/2021 1523   MCV 93.4 07/22/2021 1523   MCH 29.1 07/22/2021 1523   MCHC 31.2 07/22/2021 1523   RDW 13.9 07/22/2021 1523   LYMPHSABS 1.3 05/26/2010 1427   MONOABS 0.5 05/26/2010 1427   EOSABS 0.0 05/26/2010 1427   BASOSABS 0.0 05/26/2010 1427    BMET    Component Value Date/Time   NA 138 07/22/2021 0733   K 3.9 07/22/2021 0733   CL 108 07/22/2021 0733   CO2 27 03/16/2021 0129   GLUCOSE 89 07/22/2021 0733   BUN 16 07/22/2021 0733   CREATININE 0.81 07/22/2021 1523   CALCIUM 7.5 (L) 03/16/2021 0129   GFRNONAA >60 07/22/2021 1523    GFRAA  10/06/2010 1259    >60        The eGFR has been calculated using the MDRD equation. This calculation has not been validated in all clinical situations. eGFR's persistently <60 mL/min signify possible Chronic Kidney Disease.      Discharge Instructions     Call MD for:  redness, tenderness, or signs of infection (pain, swelling, bleeding, redness, odor or green/yellow discharge around incision site)   Complete by: As directed    Call MD for:  severe or increased pain, loss or decreased feeling  in affected limb(s)   Complete by: As directed    Call MD for:  temperature >100.5   Complete by: As directed    Discharge wound care:   Complete by: As directed    Wound vac change every M/W/F by the home health nurse   Driving Restrictions   Complete by: As directed    No driving for 2 weeks   Lifting restrictions   Complete by: As directed    No heavy lifting for 2 weeks   Resume previous diet   Complete by: As directed        Discharge Diagnosis:  PAD (peripheral artery disease) ([place]) [I73.9]  Secondary Diagnosis: Patient Active Problem List   Diagnosis Date  Noted   PAD (peripheral artery disease) ([place]) 03/12/2021   Gout    Current smoker    ALCOHOL ABUSE 09/25/2008   Essential hypertension 09/25/2008   Atrial fibrillation ([place]) 09/25/2008   GASTRITIS 09/25/2008   CARDIAC MURMUR, HX OF 09/25/2008   Past Medical History:  Diagnosis Date   Alcoholism ([place])    History of withdrawal and seizures   Atrial fibrillation ([place])    Taken off of Coumadin in 2009 in the setting of GI bleed   Chronic back pain    Colitis    4/11   Essential hypertension    Gout    History of GI bleed    Internal hemorrhoids    Colonoscopy 11/09   Osteoarthritis    Peripheral vascular disease ([place])    Schatzki's ring    EGD 11/09     Allergies as of 07/23/2021   No Known Allergies      Medication List     TAKE these medications    amLODipine 5 MG tablet Commonly  known as: NORVASC Take 0.5 tablets (2.5 mg total) by mouth daily.   aspirin 81 MG EC tablet Take 1 tablet (81 mg total) by mouth daily at 6 (six) AM. Swallow whole.   clopidogrel 75 MG tablet Commonly known as: PLAVIX TAKE ONE TABLET BY MOUTH ONCE DAILY. What changed: when to take this   collagenase ointment Commonly known as: SANTYL Apply 1 application topically daily.   Ensure Take 118.5 mLs by mouth daily with breakfast.   HYDROcodone-acetaminophen 5-325 MG tablet Commonly known as: Norco Take 1 tablet by mouth every 6 (six) hours as needed for moderate pain.   OVER THE COUNTER MEDICATION Take 6-8 oz by mouth daily. V8   rosuvastatin 10 MG tablet Commonly known as: CRESTOR TAKE (1) TABLET BY MOUTH ONCE DAILY.               Durable Medical Equipment  (From admission, onward)           Start     Ordered   07/22/21 1527  For home use only DME Negative pressure wound device  Once       Question Answer Comment  Frequency of dressing change 3 times per week   Length of need 3 Months   Dressing type Foam   Amount of suction 125 mm/Hg   Pressure application Continuous pressure   Supplies 10 canisters and 15 dressings per month for duration of therapy      07/22/21 1526              Discharge Care Instructions  (From admission, onward)           Start     Ordered   07/23/21 0000  Discharge wound care:       Comments: Wound vac change every M/W/F by the home health nurse   07/23/21 1205            Prescriptions given: Norco#20 No Refill (PDMP reviewed)  Instructions: 1.  No driving for 2 weeks and while taking pain medication 2.  No heavy lifting x 2 weeks 3.  Wound vac changes M/W/F by [facility]  Disposition: home with [facility] services  Patient's condition: is Good  Follow up: 1. [facility]  in 2-3 weeks.  Pt to bring wound vac supplies to the office   Leontine Locket, PA-C [facility] [phone] 07/23/2021  12:09  PM

## 2021-07-23 NOTE — Consult Note (Signed)
WOC Nurse Consult Note: Patient receiving care in [facility] 4E10. Reason for Consult: Wound VAC placement to incisions on LLE. Wound type: surgical Upon arrival to the room I was met by the primary care RN. She explained that they had been having to change the underpad the patient was sitting on because blood was oozing out from under the LLE VAC dressing.  I very carefully removed what appeared as one piece of foam dressing that covered the left groin wound and 2 distal thigh linear incisions, including the intact skin between the 3 wound areas.   A large congealed blood clot was removed from the crease of the groin area.  This clot was created by oozing of the groin wound and leakage under the VAC drape and toward the patient's scrotum.  The most superior thigh incision was full of congealed blood clot; no oozing was seen from this wound.  The most distal thigh incision constantly and briskly oozed blood.  Some of this may have come from the upper thigh incision area that could not drain due to the clot. The foam dressing that was removed was heavy with congealed blood and there was blood oozing from the lower area of the dressing in proximity to the  lower incision site.  The primary RN was able to get a Vascular physician assistant, Dagoberto Ligas PA-C, on the phone and she and discussed our findings with him.  I received instructions from him to place gauze over the areas and secure the dressing with kerlix and he would alert Dr. Trula Slade of the situation.  I was able to get a short piece of gauze inserted into the upper edge of the lower incision (the one that was oozing). I then covered all wounds with additional dry gauze, and the groin wound with ABD pad. I secured the leg portion with a roll of kerlix and 4 inch Ace Wrap. The groin wound was covered with a folded ABD pad and taped in place.  VAC supplies are in the room should Vascular Services decide to resume NPWT to the areas.  [place] nurse will  not follow at this time.  Please re-consult the [place] team if needed.  Val Riles, RN, MSN, CWOCN, CNS-BC, pager [phone]

## 2021-07-24 NOTE — TOC Transition Note (Signed)
Transition of Care [facility]) - CM/SW Discharge Note   Patient Details  Name: Todd [name] MRN: [id number] Date of Birth: 1950/11/09  Transition of Care [facility]) CM/SW Contact:  Konrad Penta, RN Phone Number: [phone] 07/24/2021, 3:34 PM  Clinical Narrative:   Olivia Mackie with [employer] informed writer [employer] unable to verify DME coverage with [employer] during the weekend. On-call TOC leadership made aware. LOG was approved thru Andreas Newport, [facility] weekend on-call Supervisor thru Royann Shivers, [facility] Director.   Received Proof of Delivery form from [place] with [employer]. Delivered Ready Care Kit with wound vac to patient's room . Proof of Delivery form signed by Todd [name]. Patient's nurse at bedside to switch to home wound vac. Faxed Proof of Delivery form to [place] at fax number [phone] with fax confirmation of receipt. Provided Todd [name] paperwork. Todd [name] at bedside.   Confirmed with Todd [name] with Latricia Heft that start of care will be Monday.   No further needs assessed.     Final next level of care: [place] [facility] [place] Barriers to Discharge: No Barriers Identified   Patient Goals and CMS Choice Patient states their goals for this hospitalization and ongoing recovery are:: return home CMS Medicare.gov Compare Post Acute Care list provided to:: Patient Choice offered to / list presented to : Patient, Spouse  Discharge Placement                       Discharge Plan and Services   Discharge Planning Services: CM Consult            DME Arranged: Vac DME Agency: [employer] Date DME Agency Contacted: 07/23/21 Time DME Agency Contacted: 72 Representative spoke with at DME Agency: [place] Arranged: RN [facility] Agency: [place] Date [place]: 07/23/21 Time [place]: 33 Representative spoke with at [place]: Todd [name]  Social Determinants of Health ([place]) Interventions     Readmission Risk Interventions Readmission Risk Prevention Plan 07/23/2021 03/16/2021   Post Dischage Appt Complete Complete  Medication Screening Complete Complete  Transportation Screening Complete Complete  Some recent data might be hidden

## 2021-07-24 NOTE — TOC CM/SW Note (Signed)
Contacted Olivia Mackie with [employer] to find out status of insurance authorization for wound vac. Writer informed insurance authorization for wound vac is still pending. Olivia Mackie mentioned if patient wants to discharge despite insurance authorization, he can sign an APN form/waiver which means patient will be financially responsible for wound vac.   Writer went to room to make Todd [name] aware of all of the above information. At this time, Todd [name] wants to continue to wait for insurance authorization.   Writer made treatment team aware insurance Josem Kaufmann is still pending.  Olivia Mackie stated she will keep Probation officer updated on insurance authorization.  Will continue to follow.   Marthenia Rolling, MSN, RN,BSN Inpatient [facility] Case Manager [phone]

## 2021-07-24 NOTE — Progress Notes (Addendum)
Progress Note    07/24/2021 7:18 AM 2 Days Post-Op  Subjective:  wants to go home.  Afebrile HR 70's-100's afib 478'S-128'S systolic 08% RA  Vitals:   07/23/21 2320 07/24/21 0324  BP: 131/72 (!) 118/91  Pulse:  94  Resp: 18 19  Temp: 98 F (36.7 C) 97.9 F (36.6 C)  SpO2: 99% 97%    Physical Exam: General:  no distress Lungs:  non labored Incisions:  right leg wound vacs with good seal Extremities:  right foot is warm and well perfused.  CBC    Component Value Date/Time   WBC 8.2 07/22/2021 1523   RBC 4.09 (L) 07/22/2021 1523   HGB 11.9 (L) 07/22/2021 1523   HCT 38.2 (L) 07/22/2021 1523   PLT 224 07/22/2021 1523   MCV 93.4 07/22/2021 1523   MCH 29.1 07/22/2021 1523   MCHC 31.2 07/22/2021 1523   RDW 13.9 07/22/2021 1523   LYMPHSABS 1.3 05/26/2010 1427   MONOABS 0.5 05/26/2010 1427   EOSABS 0.0 05/26/2010 1427   BASOSABS 0.0 05/26/2010 1427    BMET    Component Value Date/Time   NA 138 07/22/2021 0733   K 3.9 07/22/2021 0733   CL 108 07/22/2021 0733   CO2 27 03/16/2021 0129   GLUCOSE 89 07/22/2021 0733   BUN 16 07/22/2021 0733   CREATININE 0.81 07/22/2021 1523   CALCIUM 7.5 (L) 03/16/2021 0129   GFRNONAA >60 07/22/2021 1523   GFRAA  10/06/2010 1259    >60        The eGFR has been calculated using the MDRD equation. This calculation has not been validated in all clinical situations. eGFR's persistently <60 mL/min signify possible Chronic Kidney Disease.    INR    Component Value Date/Time   INR 1.0 03/11/2021 1112     Intake/Output Summary (Last 24 hours) at 07/24/2021 0718 Last data filed at 07/24/2021 1388 Gross per 24 hour  Intake 480 ml  Output 2245 ml  Net -1765 ml   Component 2 d ago  Specimen Description WOUND   Special Requests RIGHT GROIN WOUND   Gram Stain NO WBC SEEN  NO ORGANISMS SEEN   Culture CULTURE REINCUBATED FOR BETTER GROWTH  Performed at [facility], [place] [address], [place], [place] [address]    Report Status PENDING     Assessment/Plan:  70 y.o. male is s/p:  I&D right groin and vein harvest site and placement of wound vac  2 Days Post-Op   -wound vacs with good seal.  No further issues with tubing clotting off. -still awaiting insurance approval.  HH needs are arranged and can dc home once insurance has been approved   Leontine Locket, [place] [facility] [phone] 07/24/2021 7:18 AM  I have independently interviewed and examined patient and agree with PA assessment and plan above.   Abdurahman Rugg C. Donzetta Matters, MD [facility] [place] Office: [phone] Pager: [phone]

## 2021-07-26 DIAGNOSIS — I739 Peripheral vascular disease, unspecified: Secondary | ICD-10-CM | POA: Diagnosis not present

## 2021-07-27 LAB — AEROBIC/ANAEROBIC CULTURE W GRAM STAIN (SURGICAL/DEEP WOUND): Gram Stain: NONE SEEN

## 2021-07-28 DIAGNOSIS — Z7902 Long term (current) use of antithrombotics/antiplatelets: Secondary | ICD-10-CM | POA: Diagnosis not present

## 2021-07-28 DIAGNOSIS — F17210 Nicotine dependence, cigarettes, uncomplicated: Secondary | ICD-10-CM | POA: Diagnosis not present

## 2021-07-28 DIAGNOSIS — I10 Essential (primary) hypertension: Secondary | ICD-10-CM | POA: Diagnosis not present

## 2021-07-28 DIAGNOSIS — R011 Cardiac murmur, unspecified: Secondary | ICD-10-CM | POA: Diagnosis not present

## 2021-07-28 DIAGNOSIS — Z7982 Long term (current) use of aspirin: Secondary | ICD-10-CM | POA: Diagnosis not present

## 2021-07-28 DIAGNOSIS — I739 Peripheral vascular disease, unspecified: Secondary | ICD-10-CM | POA: Diagnosis not present

## 2021-07-28 DIAGNOSIS — Z4801 Encounter for change or removal of surgical wound dressing: Secondary | ICD-10-CM | POA: Diagnosis not present

## 2021-07-28 DIAGNOSIS — I4891 Unspecified atrial fibrillation: Secondary | ICD-10-CM | POA: Diagnosis not present

## 2021-07-28 DIAGNOSIS — T8189XA Other complications of procedures, not elsewhere classified, initial encounter: Secondary | ICD-10-CM | POA: Diagnosis not present

## 2021-07-28 DIAGNOSIS — M109 Gout, unspecified: Secondary | ICD-10-CM | POA: Diagnosis not present

## 2021-07-28 DIAGNOSIS — Z48812 Encounter for surgical aftercare following surgery on the circulatory system: Secondary | ICD-10-CM | POA: Diagnosis not present

## 2021-07-30 DIAGNOSIS — T8189XA Other complications of procedures, not elsewhere classified, initial encounter: Secondary | ICD-10-CM | POA: Diagnosis not present

## 2021-07-30 DIAGNOSIS — S31103A Unspecified open wound of abdominal wall, right lower quadrant without penetration into peritoneal cavity, initial encounter: Secondary | ICD-10-CM | POA: Diagnosis not present

## 2021-08-02 ENCOUNTER — Encounter: Payer: Self-pay | Admitting: Physician Assistant

## 2021-08-02 ENCOUNTER — Ambulatory Visit (INDEPENDENT_AMBULATORY_CARE_PROVIDER_SITE_OTHER): Payer: PPO | Admitting: Physician Assistant

## 2021-08-02 ENCOUNTER — Other Ambulatory Visit: Payer: Self-pay

## 2021-08-02 VITALS — BP 128/77 | HR 96 | Temp 98.3°F | Resp 20 | Ht 68.0 in | Wt 130.0 lb

## 2021-08-02 DIAGNOSIS — I739 Peripheral vascular disease, unspecified: Secondary | ICD-10-CM

## 2021-08-02 MED ORDER — SULFAMETHOXAZOLE-TRIMETHOPRIM 400-80 MG PO TABS: 1.0000 | ORAL_TABLET | Freq: Two times a day (BID) | ORAL | 0 refills | Status: DC

## 2021-08-02 NOTE — Progress Notes (Signed)
POST OPERATIVE OFFICE NOTE    CC:  F/u for surgery  HPI:  This is a 71 y.o. male who who has previously undergone femoral endarterectomy and femoropopliteal bypass graft.  He has had persistent lymphatic drainage from his groin.  He was taken to the OR and is s/p I&D right groin and vein harvest site on 07/22/2021 by Dr. Trula Slade.    Pt returns today for follow up.  Pt states he has been doing well.  He has not had any fevers/chills.  He said while watching the race last night, he got a burning sensation on the front part of his lower right leg.  He does not have any pain in his foot.    Wound cx from OR as follows: Specimen Description WOUND   Special Requests RIGHT GROIN WOUND   Gram Stain NO WBC SEEN  NO ORGANISMS SEEN   Culture RARE ENTEROBACTER SPECIES  RARE CORYNEBACTERIUM STRIATUM  Standardized susceptibility testing for this organism is not available.  NO ANAEROBES ISOLATED  Performed at [facility], [place] [address] [place], [place] [address]   Report Status 07/27/2021 FINAL   Organism ID, Bacteria ENTEROBACTER SPECIES   Resulting Agency [facility]     Susceptibility   Enterobacter species    MIC    CEFAZOLIN >=64 RESIST... Resistant    CEFEPIME <=0.12 SENS... Sensitive    CEFTAZIDIME <=1 SENSITIVE  Sensitive    CIPROFLOXACIN <=0.25 SENS... Sensitive    GENTAMICIN <=1 SENSITIVE  Sensitive    IMIPENEM <=0.25 SENS... Sensitive    PIP/TAZO <=4 SENSITIVE  Sensitive    TRIMETH/SULFA <=20 SENSIT... Sensitive             No Known Allergies  Current Outpatient Medications  Medication Sig Dispense Refill   aspirin EC 81 MG EC tablet Take 1 tablet (81 mg total) by mouth daily at 6 (six) AM. Swallow whole. 30 tablet 11   clopidogrel (PLAVIX) 75 MG tablet TAKE ONE TABLET BY MOUTH ONCE DAILY. (Patient taking differently: Take 75 mg by mouth daily with supper.) 90 tablet 2   collagenase (SANTYL) ointment Apply 1 application topically daily. 30 g 1   Ensure  (ENSURE) Take 118.5 mLs by mouth daily with breakfast.     HYDROcodone-acetaminophen (NORCO) 5-325 MG tablet Take 1 tablet by mouth every 6 (six) hours as needed for moderate pain. 20 tablet 0   OVER THE COUNTER MEDICATION Take 6-8 oz by mouth daily. V8     rosuvastatin (CRESTOR) 10 MG tablet TAKE (1) TABLET BY MOUTH ONCE DAILY. 30 tablet 0   amLODipine (NORVASC) 5 MG tablet Take 0.5 tablets (2.5 mg total) by mouth daily. 45 tablet 3   No current facility-administered medications for this visit.     ROS:  See HPI  Physical Exam:  Today's Vitals   08/02/21 1015  BP: 128/77  Pulse: 96  Resp: 20  Temp: 98.3 F (36.8 C)  TempSrc: Temporal  SpO2: 96%  Weight: 130 lb (59 kg)  Height: 5\' 8"  (1.727 m)   Body mass index is 19.77 kg/m.   Incision:       Extremities:  monophasic right AT/pero and biphasic PT; left PT doppler signal present   Assessment/Plan:  This is a 71 y.o. male who is s/p: I&D right groin and vein harvest site on 07/22/2021 by Dr. Trula Slade.    -wound vac removed this morning and wet to dry dressings placed before coming in today. -wounds look good today and  healing without evidence of infection.  He has not had any fever or chills.  Discussed culture with Dr. Trula Slade and will treat him with Bactrim for 10 days.  Rx sent to his pharmacy.   -pt will return in 4 weeks for wound check.   -his HHRN is coming back this afternoon to replace wound vac.  Discussed with pt and his wife that he can get in the shower before she arrives and shower with soap and water before she puts the vac on.  They expressed understanding.     Leontine Locket, [facility] [phone]   Clinic MD:  Trula Slade

## 2021-08-05 DIAGNOSIS — T8189XA Other complications of procedures, not elsewhere classified, initial encounter: Secondary | ICD-10-CM | POA: Diagnosis not present

## 2021-08-05 DIAGNOSIS — S31103A Unspecified open wound of abdominal wall, right lower quadrant without penetration into peritoneal cavity, initial encounter: Secondary | ICD-10-CM | POA: Diagnosis not present

## 2021-08-16 DIAGNOSIS — Z4801 Encounter for change or removal of surgical wound dressing: Secondary | ICD-10-CM | POA: Diagnosis not present

## 2021-08-16 DIAGNOSIS — I4891 Unspecified atrial fibrillation: Secondary | ICD-10-CM | POA: Diagnosis not present

## 2021-08-16 DIAGNOSIS — I739 Peripheral vascular disease, unspecified: Secondary | ICD-10-CM | POA: Diagnosis not present

## 2021-08-16 DIAGNOSIS — Z48812 Encounter for surgical aftercare following surgery on the circulatory system: Secondary | ICD-10-CM | POA: Diagnosis not present

## 2021-08-18 DIAGNOSIS — T8189XA Other complications of procedures, not elsewhere classified, initial encounter: Secondary | ICD-10-CM | POA: Diagnosis not present

## 2021-08-26 DIAGNOSIS — H524 Presbyopia: Secondary | ICD-10-CM | POA: Diagnosis not present

## 2021-08-26 DIAGNOSIS — Z961 Presence of intraocular lens: Secondary | ICD-10-CM | POA: Diagnosis not present

## 2021-08-30 ENCOUNTER — Ambulatory Visit (INDEPENDENT_AMBULATORY_CARE_PROVIDER_SITE_OTHER): Payer: PPO | Admitting: Physician Assistant

## 2021-08-30 ENCOUNTER — Other Ambulatory Visit: Payer: Self-pay

## 2021-08-30 VITALS — BP 127/76 | HR 78 | Temp 97.7°F | Resp 16 | Ht 68.0 in | Wt 126.0 lb

## 2021-08-30 DIAGNOSIS — F17210 Nicotine dependence, cigarettes, uncomplicated: Secondary | ICD-10-CM | POA: Diagnosis not present

## 2021-08-30 DIAGNOSIS — I739 Peripheral vascular disease, unspecified: Secondary | ICD-10-CM

## 2021-08-30 DIAGNOSIS — Z7902 Long term (current) use of antithrombotics/antiplatelets: Secondary | ICD-10-CM | POA: Diagnosis not present

## 2021-08-30 DIAGNOSIS — R011 Cardiac murmur, unspecified: Secondary | ICD-10-CM | POA: Diagnosis not present

## 2021-08-30 DIAGNOSIS — Z4801 Encounter for change or removal of surgical wound dressing: Secondary | ICD-10-CM | POA: Diagnosis not present

## 2021-08-30 DIAGNOSIS — M109 Gout, unspecified: Secondary | ICD-10-CM | POA: Diagnosis not present

## 2021-08-30 DIAGNOSIS — Z48812 Encounter for surgical aftercare following surgery on the circulatory system: Secondary | ICD-10-CM | POA: Diagnosis not present

## 2021-08-30 DIAGNOSIS — I4891 Unspecified atrial fibrillation: Secondary | ICD-10-CM | POA: Diagnosis not present

## 2021-08-30 DIAGNOSIS — Z7982 Long term (current) use of aspirin: Secondary | ICD-10-CM | POA: Diagnosis not present

## 2021-08-30 DIAGNOSIS — I10 Essential (primary) hypertension: Secondary | ICD-10-CM | POA: Diagnosis not present

## 2021-08-30 DIAGNOSIS — T8131XD Disruption of external operation (surgical) wound, not elsewhere classified, subsequent encounter: Secondary | ICD-10-CM

## 2021-08-30 NOTE — Progress Notes (Signed)
?  POST OPERATIVE OFFICE NOTE ? ? ? ?CC:  F/u for surgery ? ?HPI:  This is a 71 y.o. male who is s/p right fem-pop bypass 03/12/21 with slow healing incisions s/p return to the OR for right groin I&D on 07/22/21 by Dr. Trula Slade.   ? ?Pt returns today for follow up.  Pt states all the incisions are healed now and he wants to restart his walking exercise.  He denies new symptoms of claudication, rest pain or non healing wounds.  He has completed the oral antibiotics.  No fever or chills.   ? ?No Known Allergies ? ?Current Outpatient Medications  ?Medication Sig Dispense Refill  ? aspirin EC 81 MG EC tablet Take 1 tablet (81 mg total) by mouth daily at 6 (six) AM. Swallow whole. 30 tablet 11  ? clopidogrel (PLAVIX) 75 MG tablet TAKE ONE TABLET BY MOUTH ONCE DAILY. (Patient taking differently: Take 75 mg by mouth daily with supper.) 90 tablet 2  ? Ensure (ENSURE) Take 118.5 mLs by mouth daily with breakfast.    ? rosuvastatin (CRESTOR) 10 MG tablet TAKE (1) TABLET BY MOUTH ONCE DAILY. 30 tablet 0  ? amLODipine (NORVASC) 5 MG tablet Take 0.5 tablets (2.5 mg total) by mouth daily. 45 tablet 3  ? collagenase (SANTYL) ointment Apply 1 application topically daily. (Patient not taking: Reported on 08/30/2021) 30 g 1  ? HYDROcodone-acetaminophen (NORCO) 5-325 MG tablet Take 1 tablet by mouth every 6 (six) hours as needed for moderate pain. (Patient not taking: Reported on 08/30/2021) 20 tablet 0  ? OVER THE COUNTER MEDICATION Take 6-8 oz by mouth daily. V8 (Patient not taking: Reported on 08/30/2021)    ? sulfamethoxazole-trimethoprim (BACTRIM) 400-80 MG tablet Take 1 tablet by mouth 2 (two) times daily. (Patient not taking: Reported on 08/30/2021) 20 tablet 0  ? ?No current facility-administered medications for this visit.  ? ? ? ROS:  See HPI ? ?Physical Exam: ? ? ? ? ? ? ?Incision:  well healed ?Extremities:  palpable AT pulse, doppler signals brisk PT, AT/DP intact ?Neuro: sensation grossly intact ?Heart:  RRR ? ? ?Assessment/Plan:   This is a 71 y.o. male who is s/p:s/p right fem-pop bypass with slow healing incisions s/p return to the OR for right groin I&D  ? Well healed incisions without ischemic changes. ? ?Advance exercise as tolerates, shower daily as needed.  No dressing needed at this time and [place] can be stopped.  We discussed signs and symptoms of ischemia.  If these occur he will call our office or go to [facility] ED. ? ?He will f/u in 5 months for ABI and bypass duplex.   Continue ASA and Statin daily ? ? ?Roxy Horseman ?PA-C ?[facility] ?[phone] ? ? ?Clinic MD:  Trula Slade ?

## 2021-09-03 ENCOUNTER — Other Ambulatory Visit: Payer: Self-pay | Admitting: *Deleted

## 2021-09-03 DIAGNOSIS — Z95828 Presence of other vascular implants and grafts: Secondary | ICD-10-CM

## 2021-09-03 DIAGNOSIS — I739 Peripheral vascular disease, unspecified: Secondary | ICD-10-CM

## 2021-10-19 DIAGNOSIS — Z1331 Encounter for screening for depression: Secondary | ICD-10-CM | POA: Diagnosis not present

## 2021-10-19 DIAGNOSIS — E782 Mixed hyperlipidemia: Secondary | ICD-10-CM | POA: Diagnosis not present

## 2021-10-19 DIAGNOSIS — I4891 Unspecified atrial fibrillation: Secondary | ICD-10-CM | POA: Diagnosis not present

## 2021-10-19 DIAGNOSIS — Z6820 Body mass index (BMI) 20.0-20.9, adult: Secondary | ICD-10-CM | POA: Diagnosis not present

## 2021-10-19 DIAGNOSIS — Z0001 Encounter for general adult medical examination with abnormal findings: Secondary | ICD-10-CM | POA: Diagnosis not present

## 2021-10-19 DIAGNOSIS — R0989 Other specified symptoms and signs involving the circulatory and respiratory systems: Secondary | ICD-10-CM | POA: Diagnosis not present

## 2021-10-19 DIAGNOSIS — Z125 Encounter for screening for malignant neoplasm of prostate: Secondary | ICD-10-CM | POA: Diagnosis not present

## 2021-10-19 DIAGNOSIS — I10 Essential (primary) hypertension: Secondary | ICD-10-CM | POA: Diagnosis not present

## 2021-10-20 ENCOUNTER — Other Ambulatory Visit: Payer: Self-pay | Admitting: Internal Medicine

## 2021-10-20 ENCOUNTER — Other Ambulatory Visit (HOSPITAL_COMMUNITY): Payer: Self-pay | Admitting: Internal Medicine

## 2021-10-20 DIAGNOSIS — R0989 Other specified symptoms and signs involving the circulatory and respiratory systems: Secondary | ICD-10-CM

## 2021-10-25 ENCOUNTER — Telehealth: Payer: Self-pay

## 2021-10-25 NOTE — Telephone Encounter (Signed)
Wife called stating pt has started to having worsening pain and swelling on his RLE (Hx of Bypass). She states he has been having swelling with activity but typically goes down at night, he describes the pains as burning and sharp shooting pains from his shin down to his ankle. She denies coldness and/or discoloration of his foot. She states the pt doesn't think he could wait until may to be seen and requested a sooner appt. Pt scheduled. ?

## 2021-10-28 NOTE — Progress Notes (Signed)
?Office Note  ? ? ? ?CC:  follow up ?Requesting Provider:  Redmond School, MD ? ?HPI: Todd [name] is a 71 y.o. (1950-12-25) male who presents for follow up of PAD. He is s/p right fem-pop PTFE bypass on 03/12/21 by Dr. Trula Slade with slow healing incisions. He had to return to OR for right groin I&D on 07/22/21 by Dr. Trula Slade.  At his last visit in March, he was doing well and his incisions were essentially healed. He does have some RLE swelling since surgery and intermittent shooting pains in his right  since surgery. He explains that he elevates, has tried compression and no improvement in swelling. He says he tries to stay very active  - mows his yard, tends to his garden, does other work around house. He does not report any pain on ambulation, no pain that wakes him up from sleep, non non healing wounds.  ? ?The pt is on a statin for cholesterol management.  ?The pt is on a daily aspirin.   Other AC:  Plavix ?The pt is on CCB for hypertension.   ?The pt is not diabetic.   ?Tobacco hx:  current, 1/2 ppd ? ?Past Medical History:  ?Diagnosis Date  ? Alcoholism ([place])   ? History of withdrawal and seizures  ? Atrial fibrillation ([place])   ? Taken off of Coumadin in 2009 in the setting of GI bleed  ? Chronic back pain   ? Colitis   ? 4/11  ? Essential hypertension   ? Gout   ? History of GI bleed   ? Internal hemorrhoids   ? Colonoscopy 11/09  ? Osteoarthritis   ? Peripheral vascular disease ([place])   ? Schatzki's ring   ? EGD 11/09  ? ? ?Past Surgical History:  ?Procedure Laterality Date  ? ABDOMINAL AORTOGRAM N/A 03/08/2019  ? Procedure: ABDOMINAL AORTOGRAM;  Surgeon: Elam Dutch, MD;  Location: [place] [facility];  Service: Cardiovascular;  Laterality: N/A;  ? ABDOMINAL AORTOGRAM W/LOWER EXTREMITY N/A 03/02/2021  ? Procedure: ABDOMINAL AORTOGRAM W/LOWER EXTREMITY;  Surgeon: Serafina Mitchell, MD;  Location: [place] [facility];  Service: Cardiovascular;  Laterality: N/A;  ? APPLICATION OF WOUND VAC Right  07/22/2021  ? Procedure: APPLICATION OF WOUND VAC;  Surgeon: Serafina Mitchell, MD;  [facility];  Service: Vascular;  Laterality: Right;  ? Excision of gouty lesion    ? Left index finger  ? FEMORAL-POPLITEAL BYPASS GRAFT Right 03/12/2021  ? Procedure: RIGHT FEMORAL TO POPLITEAL ARTERY BYPASS GRAFTING USING THE PROPATEN GRAFT;  Surgeon: Serafina Mitchell, MD;  Location: [place];  Service: Vascular;  Laterality: Right;  ? GROIN DEBRIDEMENT Right 07/22/2021  ? Procedure: RIGHT GROIN DEBRIDEMENT;  Surgeon: Serafina Mitchell, MD;  Location: [facility] OR;  Service: Vascular;  Laterality: Right;  ? INSERTION OF ILIAC STENT Right 03/12/2021  ? Procedure: INSERTION OF  EXTERNAL ILIAC STENT;  Surgeon: Serafina Mitchell, MD;  [facility];  Service: Vascular;  Laterality: Right;  ? LOWER EXTREMITY ANGIOGRAM Right 03/12/2021  ? Procedure: LOWER EXTREMITY ANGIOGRAM;  Surgeon: Serafina Mitchell, MD;  Location: [facility] OR;  Service: Vascular;  Laterality: Right;  ? LOWER EXTREMITY ANGIOGRAPHY Bilateral 03/08/2019  ? Procedure: Lower Extremity Angiography;  Surgeon: Elam Dutch, MD;  Location: [place] [facility];  Service: Cardiovascular;  Laterality: Bilateral;  ? LOWER EXTREMITY ANGIOGRAPHY N/A 03/15/2021  ? Procedure: LOWER EXTREMITY ANGIOGRAPHY;  Surgeon: Waynetta Sandy, MD;  Location: [place] [facility];  Service: Cardiovascular;  Laterality: N/A;  ? MASS EXCISION Right 07/07/2020  ? Procedure: EXCISION OF RIGHT FACIAL MASS;  Surgeon: Leta Baptist, MD;  Location: [place];  Service: ENT;  Laterality: Right;  ? PERIPHERAL VASCULAR BALLOON ANGIOPLASTY Right 03/02/2021  ? Procedure: PERIPHERAL VASCULAR BALLOON ANGIOPLASTY;  Surgeon: Serafina Mitchell, MD;  Location: [place] [facility];  Service: Cardiovascular;  Laterality: Right;  external iliac  ? PERIPHERAL VASCULAR INTERVENTION Bilateral 03/08/2019  ? Procedure: PERIPHERAL VASCULAR INTERVENTION;  Surgeon: Elam Dutch, MD;  Location: [place] [facility];  Service:  Cardiovascular;  Laterality: Bilateral;  ext iliac artery stent  ? PERIPHERAL VASCULAR INTERVENTION Left 03/02/2021  ? Procedure: PERIPHERAL VASCULAR INTERVENTION;  Surgeon: Serafina Mitchell, MD;  Location: [place] [facility];  Service: Cardiovascular;  Laterality: Left;  external iliac  ? PERIPHERAL VASCULAR INTERVENTION Right 03/15/2021  ? Procedure: PERIPHERAL VASCULAR INTERVENTION;  Surgeon: Waynetta Sandy, MD;  Location: [place] [facility];  Service: Cardiovascular;  Laterality: Right;  external iliac  ? Right knee arthroscopy    ? SPINE SURGERY    ? ? ?Social History  ? ?Socioeconomic History  ? Marital status: Married  ?  Spouse name: Not on file  ? Number of children: Not on file  ? Years of education: Not on file  ? Highest education level: Not on file  ?Occupational History  ? Occupation: Retired  ?  Employer: [employer]  ?Tobacco Use  ? Smoking status: Every Day  ?  Packs/day: 0.25  ?  Types: Cigarettes  ?  Passive exposure: Current  ? Smokeless tobacco: Never  ?Vaping Use  ? Vaping Use: Never used  ?Substance and Sexual Activity  ? Alcohol use: Not Currently  ?  Comment: History of heavy alcohol use, most recent quit attempt has lasted 3 months, occassionally drinks alcohol  ? Drug use: No  ? Sexual activity: Never  ?Other Topics Concern  ? Not on file  ?Social History Narrative  ? Not on file  ? ?Social Determinants of Health  ? ?Financial Resource Strain: Not on file  ?Food Insecurity: Not on file  ?Transportation Needs: Not on file  ?Physical Activity: Not on file  ?Stress: Not on file  ?Social Connections: Not on file  ?Intimate Partner Violence: Not on file  ? ? ?Family History  ?Problem Relation Age of Onset  ? Coronary artery disease Father   ? Coronary artery disease Sister   ?     MI at age 18  ? ? ?Current Outpatient Medications  ?Medication Sig Dispense Refill  ? aspirin EC 81 MG EC tablet Take 1 tablet (81 mg total) by mouth daily at 6 (six) AM. Swallow whole. 30 tablet 11  ?  clopidogrel (PLAVIX) 75 MG tablet TAKE ONE TABLET BY MOUTH ONCE DAILY. (Patient taking differently: Take 75 mg by mouth daily with supper.) 90 tablet 2  ? collagenase (SANTYL) ointment Apply 1 application topically daily. 30 g 1  ? Ensure (ENSURE) Take 118.5 mLs by mouth daily with breakfast.    ? HYDROcodone-acetaminophen (NORCO) 5-325 MG tablet Take 1 tablet by mouth every 6 (six) hours as needed for moderate pain. 20 tablet 0  ? OVER THE COUNTER MEDICATION Take 6-8 oz by mouth daily. V8    ? rosuvastatin (CRESTOR) 10 MG tablet TAKE (1) TABLET BY MOUTH ONCE DAILY. 30 tablet 0  ? sulfamethoxazole-trimethoprim (BACTRIM) 400-80 MG tablet Take 1 tablet by mouth 2 (two) times daily. 20 tablet 0  ?  amLODipine (NORVASC) 5 MG tablet Take 0.5 tablets (2.5 mg total) by mouth daily. 45 tablet 3  ? ?No current facility-administered medications for this visit.  ? ? ?No Known Allergies ? ? ?REVIEW OF SYSTEMS:  ?'[X]'$  denotes positive finding, '[ ]'$  denotes negative finding ?Cardiac  Comments:  ?Chest pain or chest pressure:    ?Shortness of breath upon exertion:    ?Short of breath when lying flat:    ?Irregular heart rhythm:    ?    ?Vascular    ?Pain in calf, thigh, or hip brought on by ambulation:    ?Pain in feet at night that wakes you up from your sleep:     ?Blood clot in your veins:    ?Leg swelling:  X   ?    ?Pulmonary    ?Oxygen at home:    ?Productive cough:     ?Wheezing:     ?    ?Neurologic    ?Sudden weakness in arms or legs:     ?Sudden numbness in arms or legs:     ?Sudden onset of difficulty speaking or slurred speech:    ?Temporary loss of vision in one eye:     ?Problems with dizziness:     ?    ?Gastrointestinal    ?Blood in stool:     ?Vomited blood:     ?    ?Genitourinary    ?Burning when urinating:     ?Blood in urine:    ?    ?Psychiatric    ?Major depression:     ?    ?Hematologic    ?Bleeding problems:    ?Problems with blood clotting too easily:    ?    ?Skin    ?Rashes or ulcers:    ?     ?Constitutional    ?Fever or chills:    ? ? ?PHYSICAL EXAMINATION: ? ?Vitals:  ? 11/01/21 0907  ?BP: (!) 143/84  ?Resp: 20  ?Temp: 97.9 ?F (36.6 ?C)  ?TempSrc: Temporal  ?Weight: 129 lb 6.4 oz (58.7 kg)  ?Height: '5\' 8"'$

## 2021-10-29 ENCOUNTER — Ambulatory Visit (INDEPENDENT_AMBULATORY_CARE_PROVIDER_SITE_OTHER)
Admission: RE | Admit: 2021-10-29 | Discharge: 2021-10-29 | Disposition: A | Discharge status: Home / Self Care | Payer: PPO | Source: Ambulatory Visit | Attending: Vascular Surgery | Admitting: Vascular Surgery

## 2021-10-29 ENCOUNTER — Ambulatory Visit (HOSPITAL_COMMUNITY)
Admission: RE | Admit: 2021-10-29 | Discharge: 2021-10-29 | Disposition: A | Discharge status: Home / Self Care | Payer: PPO | Source: Ambulatory Visit | Attending: Vascular Surgery | Admitting: Vascular Surgery

## 2021-10-29 DIAGNOSIS — I739 Peripheral vascular disease, unspecified: Secondary | ICD-10-CM | POA: Diagnosis not present

## 2021-10-29 DIAGNOSIS — Z95828 Presence of other vascular implants and grafts: Secondary | ICD-10-CM | POA: Diagnosis not present

## 2021-11-01 ENCOUNTER — Ambulatory Visit: Payer: PPO | Admitting: Physician Assistant

## 2021-11-01 ENCOUNTER — Ambulatory Visit (HOSPITAL_COMMUNITY)
Admission: RE | Admit: 2021-11-01 | Discharge: 2021-11-01 | Disposition: A | Discharge status: Home / Self Care | Payer: PPO | Source: Ambulatory Visit | Attending: Internal Medicine | Admitting: Internal Medicine

## 2021-11-01 VITALS — BP 143/84 | Temp 97.9°F | Resp 20 | Ht 68.0 in | Wt 129.4 lb

## 2021-11-01 DIAGNOSIS — Z95828 Presence of other vascular implants and grafts: Secondary | ICD-10-CM | POA: Diagnosis not present

## 2021-11-01 DIAGNOSIS — I739 Peripheral vascular disease, unspecified: Secondary | ICD-10-CM | POA: Diagnosis not present

## 2021-11-01 DIAGNOSIS — R0989 Other specified symptoms and signs involving the circulatory and respiratory systems: Secondary | ICD-10-CM | POA: Diagnosis not present

## 2021-11-01 DIAGNOSIS — E785 Hyperlipidemia, unspecified: Secondary | ICD-10-CM | POA: Diagnosis not present

## 2021-11-01 DIAGNOSIS — Z720 Tobacco use: Secondary | ICD-10-CM | POA: Diagnosis not present

## 2021-11-01 DIAGNOSIS — I10 Essential (primary) hypertension: Secondary | ICD-10-CM | POA: Diagnosis not present

## 2021-11-02 DIAGNOSIS — R7309 Other abnormal glucose: Secondary | ICD-10-CM | POA: Diagnosis not present

## 2021-11-02 DIAGNOSIS — D649 Anemia, unspecified: Secondary | ICD-10-CM | POA: Diagnosis not present

## 2021-12-13 ENCOUNTER — Other Ambulatory Visit: Payer: Self-pay | Admitting: *Deleted

## 2021-12-13 DIAGNOSIS — I739 Peripheral vascular disease, unspecified: Secondary | ICD-10-CM

## 2021-12-13 DIAGNOSIS — I70229 Atherosclerosis of native arteries of extremities with rest pain, unspecified extremity: Secondary | ICD-10-CM

## 2021-12-15 DIAGNOSIS — D649 Anemia, unspecified: Secondary | ICD-10-CM | POA: Diagnosis not present

## 2021-12-22 ENCOUNTER — Ambulatory Visit: Payer: PPO | Admitting: Vascular Surgery

## 2021-12-22 ENCOUNTER — Ambulatory Visit (INDEPENDENT_AMBULATORY_CARE_PROVIDER_SITE_OTHER): Payer: PPO

## 2021-12-22 ENCOUNTER — Encounter: Payer: Self-pay | Admitting: Vascular Surgery

## 2021-12-22 VITALS — BP 161/92 | HR 87 | Temp 99.5°F | Resp 14 | Ht 68.0 in | Wt 127.8 lb

## 2021-12-22 DIAGNOSIS — I70229 Atherosclerosis of native arteries of extremities with rest pain, unspecified extremity: Secondary | ICD-10-CM | POA: Diagnosis not present

## 2021-12-22 DIAGNOSIS — I6523 Occlusion and stenosis of bilateral carotid arteries: Secondary | ICD-10-CM | POA: Diagnosis not present

## 2021-12-22 DIAGNOSIS — I739 Peripheral vascular disease, unspecified: Secondary | ICD-10-CM | POA: Diagnosis not present

## 2021-12-22 NOTE — Progress Notes (Signed)
[facility] [place]  Patient name: Todd [name] MRN: [id number] DOB: [birth year] Sex: male  REASON FOR VISIT: Evaluation of bilateral severe carotid disease  HPI: Todd [name] is a 71 y.o. male here for evaluation of bilateral carotid disease.  He is known to our practice from recent right femoral to popliteal bypass for critical ischemia by Dr. Trula Slade.  This was November 2022.  He had prolonged healing in his right groin but underwent eventual healing.  He does have some swelling in his right lower extremity but this is resolving as well.  He specifically denies any prior history of aphasia, amaurosis fugax, TIA or stroke.  Past Medical History:  Diagnosis Date   Alcoholism ([place])    History of withdrawal and seizures   Atrial fibrillation ([place])    Taken off of Coumadin in 2009 in the setting of GI bleed   Chronic back pain    Colitis    4/11   Essential hypertension    Gout    History of GI bleed    Internal hemorrhoids    Colonoscopy 11/09   Osteoarthritis    Peripheral vascular disease ([place])    Schatzki's ring    EGD 11/09    Family History  Problem Relation Age of Onset   Coronary artery disease Father    Coronary artery disease Sister        MI at age 92    SOCIAL HISTORY: Social History   Tobacco Use   Smoking status: Every Day    Packs/day: 0.25    Types: Cigarettes    Passive exposure: Current   Smokeless tobacco: Never  Substance Use Topics   Alcohol use: Not Currently    Comment: History of heavy alcohol use, most recent quit attempt has lasted 3 months, occassionally drinks alcohol    No Known Allergies  Current Outpatient Medications  Medication Sig Dispense Refill   aspirin EC 81 MG EC tablet Take 1 tablet (81 mg total) by mouth daily at 6 (six) AM. Swallow whole. 30 tablet 11   clopidogrel (PLAVIX) 75 MG tablet TAKE ONE TABLET BY MOUTH ONCE DAILY. (Patient taking differently:  Take 75 mg by mouth daily with supper.) 90 tablet 2   collagenase (SANTYL) ointment Apply 1 application topically daily. 30 g 1   Ensure (ENSURE) Take 118.5 mLs by mouth daily with breakfast.     HYDROcodone-acetaminophen (NORCO) 5-325 MG tablet Take 1 tablet by mouth every 6 (six) hours as needed for moderate pain. 20 tablet 0   OVER THE COUNTER MEDICATION Take 6-8 oz by mouth daily. V8     rosuvastatin (CRESTOR) 10 MG tablet TAKE (1) TABLET BY MOUTH ONCE DAILY. 30 tablet 0   sulfamethoxazole-trimethoprim (BACTRIM) 400-80 MG tablet Take 1 tablet by mouth 2 (two) times daily. 20 tablet 0   amLODipine (NORVASC) 5 MG tablet Take 0.5 tablets (2.5 mg total) by mouth daily. 45 tablet 3   No current facility-administered medications for this visit.    REVIEW OF SYSTEMS:  '[X]'$  denotes positive finding, '[ ]'$  denotes negative finding Cardiac  Comments:  Chest pain or chest pressure:    Shortness of breath upon exertion:    Short of breath when lying flat:    Irregular heart rhythm:        Vascular    Pain in calf, thigh, or hip brought on by ambulation:    Pain in feet at night that wakes you up from your sleep:  Blood clot in your veins:    Leg swelling:  x         PHYSICAL EXAM: Vitals:   12/22/21 1333 12/22/21 1335  BP: (!) 142/84 (!) 161/92  Pulse: 87   Resp: 14   Temp: 99.5 F (37.5 C)   TempSrc: Temporal   SpO2: 99%   Weight: 127 lb 12.8 oz (58 kg)   Height: '5\' 8"'$  (1.727 m)     GENERAL: The patient is a well-nourished male, in no acute distress. The vital signs are documented above. CARDIOVASCULAR: Carotid arteries without bruits bilaterally.  2+ radial pulses bilaterally PULMONARY: There is good air exchange  MUSCULOSKELETAL: There are no major deformities or cyanosis. NEUROLOGIC: No focal weakness or paresthesias are detected. SKIN: There are no ulcers or rashes noted. PSYCHIATRIC: The patient has a normal affect.  DATA:  Reviewed his outpatient duplex and also we  repeated his carotid duplex today.  He does appear to have critical blockage in his carotid bifurcation on the left.  He has extreme calcification making imaging difficult.  His outpatient study had suggested a right external carotid occlusion.  It appears in our lab that this may be an occlusion of his internal carotid artery.  I discussed this with the patient explaining it is somewhat difficult to determine which artery is patent.  MEDICAL ISSUES: Critical carotid disease with critical stenosis on the left and occlusive disease on the right.  I have recommended CT angiogram for further evaluation.  Explained in all likelihood we would recommend endarterectomy on the left for critical stenosis.  Would make determination regarding appropriate treatment for right carotid based on CT scan.  We will notify him and discuss this by telephone once he has obtained a CT scan    Rosetta Posner, MD [facility] [phone]  Note: Portions of this report may have been transcribed using voice recognition software.  Every effort has been made to ensure accuracy; however, inadvertent computerized transcription errors may still be present.

## 2021-12-24 ENCOUNTER — Other Ambulatory Visit: Payer: Self-pay

## 2021-12-24 DIAGNOSIS — I6523 Occlusion and stenosis of bilateral carotid arteries: Secondary | ICD-10-CM

## 2022-01-04 ENCOUNTER — Ambulatory Visit (HOSPITAL_COMMUNITY)
Admission: RE | Admit: 2022-01-04 | Discharge: 2022-01-04 | Disposition: A | Discharge status: Home / Self Care | Payer: PPO | Source: Ambulatory Visit | Attending: Vascular Surgery | Admitting: Vascular Surgery

## 2022-01-04 DIAGNOSIS — I672 Cerebral atherosclerosis: Secondary | ICD-10-CM | POA: Diagnosis not present

## 2022-01-04 DIAGNOSIS — I6523 Occlusion and stenosis of bilateral carotid arteries: Secondary | ICD-10-CM | POA: Diagnosis not present

## 2022-01-04 DIAGNOSIS — I771 Stricture of artery: Secondary | ICD-10-CM | POA: Diagnosis not present

## 2022-01-04 DIAGNOSIS — I6503 Occlusion and stenosis of bilateral vertebral arteries: Secondary | ICD-10-CM | POA: Diagnosis not present

## 2022-01-04 MED ORDER — IOHEXOL 350 MG/ML SOLN
75.0000 mL | Freq: Once | INTRAVENOUS | Status: AC | PRN
  Administered 2022-01-04: 75 mL via INTRAVENOUS

## 2022-01-04 MED ORDER — IOHEXOL 300 MG/ML  SOLN: 75.0000 mL | Freq: Once | INTRAMUSCULAR | Status: DC | PRN

## 2022-01-05 ENCOUNTER — Encounter: Payer: Self-pay | Admitting: Vascular Surgery

## 2022-01-05 ENCOUNTER — Ambulatory Visit (INDEPENDENT_AMBULATORY_CARE_PROVIDER_SITE_OTHER): Payer: PPO | Admitting: Vascular Surgery

## 2022-01-05 ENCOUNTER — Ambulatory Visit: Payer: PPO | Admitting: Vascular Surgery

## 2022-01-05 VITALS — Wt 129.0 lb

## 2022-01-05 DIAGNOSIS — Z95828 Presence of other vascular implants and grafts: Secondary | ICD-10-CM | POA: Diagnosis not present

## 2022-01-05 DIAGNOSIS — I6523 Occlusion and stenosis of bilateral carotid arteries: Secondary | ICD-10-CM

## 2022-01-05 NOTE — Progress Notes (Signed)
Virtual Visit via Telephone Note  Referring MD: [place]  I connected with Todd [name] on 01/05/2022 using the [employer] by telephone and verified that I was speaking with the correct person using two identifiers. Patient was located at home and accompanied by no one. I am located at [place] office.   The limitations of evaluation and management by telemedicine and the availability of in person appointments have been previously discussed with the patient and are documented in the patients chart. The patient expressed understanding and consented to proceed.  PCP: Redmond School, MD   Chief Complaint: Asymptomatic carotid disease  History of Present Illness: Todd [name] is a 71 y.o. male with asymptomatic carotid disease.  Had an office visit several weeks ago.  Duplex suggested critical bilateral disease.  He had a CT scan yesterday and I am discussing this with telephone today  Past Medical History:  Diagnosis Date   Alcoholism ([place])    History of withdrawal and seizures   Atrial fibrillation ([place])    Taken off of Coumadin in 2009 in the setting of GI bleed   Chronic back pain    Colitis    4/11   Essential hypertension    Gout    History of GI bleed    Internal hemorrhoids    Colonoscopy 11/09   Osteoarthritis    Peripheral vascular disease ([place])    Schatzki's ring    EGD 11/09    Past Surgical History:  Procedure Laterality Date   ABDOMINAL AORTOGRAM N/A 03/08/2019   Procedure: ABDOMINAL AORTOGRAM;  Surgeon: Elam Dutch, MD;  Location: [place] [facility];  Service: Cardiovascular;  Laterality: N/A;   ABDOMINAL AORTOGRAM W/LOWER EXTREMITY N/A 03/02/2021   Procedure: ABDOMINAL AORTOGRAM W/LOWER EXTREMITY;  Surgeon: Serafina Mitchell, MD;  Location: [place] [facility];  Service: Cardiovascular;  Laterality: N/A;   APPLICATION OF WOUND VAC Right 07/22/2021   Procedure: APPLICATION OF WOUND VAC;  Surgeon: Serafina Mitchell, MD;  [facility];  Service:  Vascular;  Laterality: Right;   Excision of gouty lesion     Left index finger   FEMORAL-POPLITEAL BYPASS GRAFT Right 03/12/2021   Procedure: RIGHT FEMORAL TO POPLITEAL ARTERY BYPASS GRAFTING USING THE PROPATEN GRAFT;  Surgeon: Serafina Mitchell, MD;  Location: [place];  Service: Vascular;  Laterality: Right;   GROIN DEBRIDEMENT Right 07/22/2021   Procedure: RIGHT GROIN DEBRIDEMENT;  Surgeon: Serafina Mitchell, MD;  Location: [place];  Service: Vascular;  Laterality: Right;   INSERTION OF ILIAC STENT Right 03/12/2021   Procedure: INSERTION OF  EXTERNAL ILIAC STENT;  Surgeon: Serafina Mitchell, MD;  [facility];  Service: Vascular;  Laterality: Right;   LOWER EXTREMITY ANGIOGRAM Right 03/12/2021   Procedure: LOWER EXTREMITY ANGIOGRAM;  Surgeon: Serafina Mitchell, MD;  [facility];  Service: Vascular;  Laterality: Right;   LOWER EXTREMITY ANGIOGRAPHY Bilateral 03/08/2019   Procedure: Lower Extremity Angiography;  Surgeon: Elam Dutch, MD;  Location: [place] [facility];  Service: Cardiovascular;  Laterality: Bilateral;   LOWER EXTREMITY ANGIOGRAPHY N/A 03/15/2021   Procedure: LOWER EXTREMITY ANGIOGRAPHY;  Surgeon: Waynetta Sandy, MD;  Location: [place] [facility];  Service: Cardiovascular;  Laterality: N/A;   MASS EXCISION Right 07/07/2020   Procedure: EXCISION OF RIGHT FACIAL MASS;  Surgeon: Leta Baptist, MD;  Location: [place];  Service: ENT;  Laterality: Right;   PERIPHERAL VASCULAR BALLOON ANGIOPLASTY Right 03/02/2021   Procedure: PERIPHERAL VASCULAR BALLOON ANGIOPLASTY;  Surgeon: Serafina Mitchell, MD;  Location: [place] [facility];  Service: Cardiovascular;  Laterality: Right;  external iliac   PERIPHERAL VASCULAR INTERVENTION Bilateral 03/08/2019   Procedure: PERIPHERAL VASCULAR INTERVENTION;  Surgeon: Elam Dutch, MD;  Location: [place] [facility];  Service: Cardiovascular;  Laterality: Bilateral;  ext iliac artery stent   PERIPHERAL VASCULAR INTERVENTION Left  03/02/2021   Procedure: PERIPHERAL VASCULAR INTERVENTION;  Surgeon: Serafina Mitchell, MD;  Location: [place] [facility];  Service: Cardiovascular;  Laterality: Left;  external iliac   PERIPHERAL VASCULAR INTERVENTION Right 03/15/2021   Procedure: PERIPHERAL VASCULAR INTERVENTION;  Surgeon: Waynetta Sandy, MD;  Location: [place] [facility];  Service: Cardiovascular;  Laterality: Right;  external iliac   Right knee arthroscopy     SPINE SURGERY      Current Meds  Medication Sig   aspirin EC 81 MG EC tablet Take 1 tablet (81 mg total) by mouth daily at 6 (six) AM. Swallow whole.   clopidogrel (PLAVIX) 75 MG tablet TAKE ONE TABLET BY MOUTH ONCE DAILY. (Patient taking differently: Take 75 mg by mouth daily with supper.)   collagenase (SANTYL) ointment Apply 1 application topically daily.   Ensure (ENSURE) Take 118.5 mLs by mouth daily with breakfast.   HYDROcodone-acetaminophen (NORCO) 5-325 MG tablet Take 1 tablet by mouth every 6 (six) hours as needed for moderate pain.   OVER THE COUNTER MEDICATION Take 6-8 oz by mouth daily. V8   rosuvastatin (CRESTOR) 10 MG tablet TAKE (1) TABLET BY MOUTH ONCE DAILY.   sulfamethoxazole-trimethoprim (BACTRIM) 400-80 MG tablet Take 1 tablet by mouth 2 (two) times daily.    12 system ROS was negative unless otherwise noted in HPI   Observations/Objective:  CT scan has not been officially interpreted by radiologist.  I reviewed his images and discussed them with the patient.  I did explain that we would review to see if there are other nonvascular concerns noted by radiology.  His images reveal critical left internal carotid stenosis at the bifurcation.  He does have a low bifurcation with extreme calcification.  He does have extensive calcification in the aortic arch and innominate and left common carotid origin but these do not appear to be flow-limiting.  He has an occluded right internal carotid artery Assessment and Plan:  I recommended left  carotid endarterectomy for reduction of stroke risk.  I discussed the procedure including risk with the patient.  He has had femoral to popliteal bypass with Dr. Trula Slade in November 2022 and has a great deal of confidence in him.  I did offer office visit with Dr. Trula Slade but he feels comfortable proceeding in speaking with Dr. Trula Slade on the morning of the surgery.  We will schedule this at his convenience in [place]    I discussed the assessment and treatment plan with the patient. The patient was provided an opportunity to ask questions and all were answered. The patient agreed with the plan and demonstrated an understanding of the instructions.   The patient was advised to call back or seek an in-person evaluation if the symptoms worsen or if the condition fails to improve as anticipated.  I spent 10 minutes with the patient via telephone encounter.   Annamary Rummage [facility] [place] Office: [phone]  01/05/2022, 8:46 AM

## 2022-01-05 NOTE — H&P (View-Only) (Signed)
Virtual Visit via Telephone Note  Referring MD: [place]  I connected with Todd [name] on 01/05/2022 using the [employer] by telephone and verified that I was speaking with the correct person using two identifiers. Patient was located at home and accompanied by no one. I am located at [place] office.   The limitations of evaluation and management by telemedicine and the availability of in person appointments have been previously discussed with the patient and are documented in the patients chart. The patient expressed understanding and consented to proceed.  PCP: Redmond School, MD   Chief Complaint: Asymptomatic carotid disease  History of Present Illness: Todd [name] is a 71 y.o. male with asymptomatic carotid disease.  Had an office visit several weeks ago.  Duplex suggested critical bilateral disease.  He had a CT scan yesterday and I am discussing this with telephone today  Past Medical History:  Diagnosis Date   Alcoholism ([place])    History of withdrawal and seizures   Atrial fibrillation ([place])    Taken off of Coumadin in 2009 in the setting of GI bleed   Chronic back pain    Colitis    4/11   Essential hypertension    Gout    History of GI bleed    Internal hemorrhoids    Colonoscopy 11/09   Osteoarthritis    Peripheral vascular disease ([place])    Schatzki's ring    EGD 11/09    Past Surgical History:  Procedure Laterality Date   ABDOMINAL AORTOGRAM N/A 03/08/2019   Procedure: ABDOMINAL AORTOGRAM;  Surgeon: Elam Dutch, MD;  Location: [facility];  Service: Cardiovascular;  Laterality: N/A;   ABDOMINAL AORTOGRAM W/LOWER EXTREMITY N/A 03/02/2021   Procedure: ABDOMINAL AORTOGRAM W/LOWER EXTREMITY;  Surgeon: Serafina Mitchell, MD;  Location: [facility];  Service: Cardiovascular;  Laterality: N/A;   APPLICATION OF WOUND VAC Right 07/22/2021   Procedure: APPLICATION OF WOUND VAC;  Surgeon: Serafina Mitchell, MD;  Location: [facility];  Service:  Vascular;  Laterality: Right;   Excision of gouty lesion     Left index finger   FEMORAL-POPLITEAL BYPASS GRAFT Right 03/12/2021   Procedure: RIGHT FEMORAL TO POPLITEAL ARTERY BYPASS GRAFTING USING THE PROPATEN GRAFT;  Surgeon: Serafina Mitchell, MD;  Location: [facility];  Service: Vascular;  Laterality: Right;   GROIN DEBRIDEMENT Right 07/22/2021   Procedure: RIGHT GROIN DEBRIDEMENT;  Surgeon: Serafina Mitchell, MD;  Location: [facility];  Service: Vascular;  Laterality: Right;   INSERTION OF ILIAC STENT Right 03/12/2021   Procedure: INSERTION OF  EXTERNAL ILIAC STENT;  Surgeon: Serafina Mitchell, MD;  [facility];  Service: Vascular;  Laterality: Right;   LOWER EXTREMITY ANGIOGRAM Right 03/12/2021   Procedure: LOWER EXTREMITY ANGIOGRAM;  Surgeon: Serafina Mitchell, MD;  [facility];  Service: Vascular;  Laterality: Right;   LOWER EXTREMITY ANGIOGRAPHY Bilateral 03/08/2019   Procedure: Lower Extremity Angiography;  Surgeon: Elam Dutch, MD;  Location: [facility];  Service: Cardiovascular;  Laterality: Bilateral;   LOWER EXTREMITY ANGIOGRAPHY N/A 03/15/2021   Procedure: LOWER EXTREMITY ANGIOGRAPHY;  Surgeon: Waynetta Sandy, MD;  Location: [facility];  Service: Cardiovascular;  Laterality: N/A;   MASS EXCISION Right 07/07/2020   Procedure: EXCISION OF RIGHT FACIAL MASS;  Surgeon: Leta Baptist, MD;  Location: [facility];  Service: ENT;  Laterality: Right;   PERIPHERAL VASCULAR BALLOON ANGIOPLASTY Right 03/02/2021   Procedure: PERIPHERAL VASCULAR BALLOON ANGIOPLASTY;  Surgeon: Serafina Mitchell, MD;  Location: [facility];  Service: Cardiovascular;  Laterality: Right;  external iliac   PERIPHERAL VASCULAR INTERVENTION Bilateral 03/08/2019   Procedure: PERIPHERAL VASCULAR INTERVENTION;  Surgeon: Elam Dutch, MD;  Location: [facility];  Service: Cardiovascular;  Laterality: Bilateral;  ext iliac artery stent   PERIPHERAL VASCULAR INTERVENTION Left  03/02/2021   Procedure: PERIPHERAL VASCULAR INTERVENTION;  Surgeon: Serafina Mitchell, MD;  Location: [facility];  Service: Cardiovascular;  Laterality: Left;  external iliac   PERIPHERAL VASCULAR INTERVENTION Right 03/15/2021   Procedure: PERIPHERAL VASCULAR INTERVENTION;  Surgeon: Waynetta Sandy, MD;  Location: [facility];  Service: Cardiovascular;  Laterality: Right;  external iliac   Right knee arthroscopy     SPINE SURGERY      Current Meds  Medication Sig   aspirin EC 81 MG EC tablet Take 1 tablet (81 mg total) by mouth daily at 6 (six) AM. Swallow whole.   clopidogrel (PLAVIX) 75 MG tablet TAKE ONE TABLET BY MOUTH ONCE DAILY. (Patient taking differently: Take 75 mg by mouth daily with supper.)   collagenase (SANTYL) ointment Apply 1 application topically daily.   Ensure (ENSURE) Take 118.5 mLs by mouth daily with breakfast.   HYDROcodone-acetaminophen (NORCO) 5-325 MG tablet Take 1 tablet by mouth every 6 (six) hours as needed for moderate pain.   OVER THE COUNTER MEDICATION Take 6-8 oz by mouth daily. V8   rosuvastatin (CRESTOR) 10 MG tablet TAKE (1) TABLET BY MOUTH ONCE DAILY.   sulfamethoxazole-trimethoprim (BACTRIM) 400-80 MG tablet Take 1 tablet by mouth 2 (two) times daily.    12 system ROS was negative unless otherwise noted in HPI   Observations/Objective:  CT scan has not been officially interpreted by radiologist.  I reviewed his images and discussed them with the patient.  I did explain that we would review to see if there are other nonvascular concerns noted by radiology.  His images reveal critical left internal carotid stenosis at the bifurcation.  He does have a low bifurcation with extreme calcification.  He does have extensive calcification in the aortic arch and innominate and left common carotid origin but these do not appear to be flow-limiting.  He has an occluded right internal carotid artery Assessment and Plan:  I recommended left  carotid endarterectomy for reduction of stroke risk.  I discussed the procedure including risk with the patient.  He has had femoral to popliteal bypass with Dr. Trula Slade in November 2022 and has a great deal of confidence in him.  I did offer office visit with Dr. Trula Slade but he feels comfortable proceeding in speaking with Dr. Trula Slade on the morning of the surgery.  We will schedule this at his convenience in [place]    I discussed the assessment and treatment plan with the patient. The patient was provided an opportunity to ask questions and all were answered. The patient agreed with the plan and demonstrated an understanding of the instructions.   The patient was advised to call back or seek an in-person evaluation if the symptoms worsen or if the condition fails to improve as anticipated.  I spent 10 minutes with the patient via telephone encounter.   Annamary Rummage [facility] Office: [phone]  01/05/2022, 8:46 AM

## 2022-01-06 ENCOUNTER — Other Ambulatory Visit: Payer: Self-pay | Admitting: Cardiology

## 2022-01-07 ENCOUNTER — Other Ambulatory Visit: Payer: Self-pay

## 2022-01-07 DIAGNOSIS — I6523 Occlusion and stenosis of bilateral carotid arteries: Secondary | ICD-10-CM

## 2022-01-24 NOTE — Progress Notes (Signed)
Surgical Instructions    Your procedure is scheduled on Wednesday August 9th.  Report to [facility] Main Entrance "A" at 6:30 A.M., then check in with the Admitting office.  Call this number if you have problems the morning of surgery:  [phone]   If you have any questions prior to your surgery date call [phone]: Open Monday-Friday 8am-4pm    Remember:  Do not eat or drink after midnight the night before your surgery     Take these medicines the morning of surgery with A SIP OF WATER: amLODipine (NORVASC) 5 MG tablet aspirin EC 81 MG EC tablet rosuvastatin (CRESTOR) 10 MG tablet   Follow your surgeon's instructions on when to stop Aspirin and plavix.  If no instructions were given by your surgeon then you will need to call the office to get those instructions.     As of today, STOP taking any Aspirin (unless otherwise instructed by your surgeon) Aleve, Naproxen, Ibuprofen, Motrin, Advil, Goody's, BC's, all herbal medications, fish oil, and all vitamins.           Do not wear jewelry  Do not wear lotions, powders, colognes, or deodorant. Do not shave 48 hours prior to surgery.  Men may shave face and neck. Do not bring valuables to the hospital. Do not wear nail polish  [facility] is not responsible for any belongings or valuables. .   Do NOT Smoke (Tobacco/Vaping)  24 hours prior to your procedure  If you use a CPAP at night, you may bring your mask for your overnight stay.   Contacts, glasses, hearing aids, dentures or partials may not be worn into surgery, please bring cases for these belongings   For patients admitted to the hospital, discharge time will be determined by your treatment team.   Patients discharged the day of surgery will not be allowed to drive home, and someone needs to stay with them for 24 hours.   SURGICAL WAITING ROOM VISITATION Patients having surgery or a procedure may have no more than 2 support people in the waiting area - these  visitors may rotate.   Children under the age of 41 must have an adult with them who is not the patient. If the patient needs to stay at the hospital during part of their recovery, the visitor guidelines for inpatient rooms apply. Pre-op nurse will coordinate an appropriate time for 1 support person to accompany patient in pre-op.  This support person may not rotate.   Please refer to the [facility] website for the visitor guidelines for Inpatients (after your surgery is over and you are in a regular room).    Special instructions:    Oral Hygiene is also important to reduce your risk of infection.  Remember - BRUSH YOUR TEETH THE MORNING OF SURGERY WITH YOUR REGULAR TOOTHPASTE   [place]- Preparing For Surgery  Before surgery, you can play an important role. Because skin is not sterile, your skin needs to be as free of germs as possible. You can reduce the number of germs on your skin by washing with CHG (chlorahexidine gluconate) Soap before surgery.  CHG is an antiseptic cleaner which kills germs and bonds with the skin to continue killing germs even after washing.     Please do not use if you have an allergy to CHG or antibacterial soaps. If your skin becomes reddened/irritated stop using the CHG.  Do not shave (including legs and underarms) for at least 48 hours prior to first CHG  shower. It is OK to shave your face.  Please follow these instructions carefully.     Shower the NIGHT BEFORE SURGERY and the MORNING OF SURGERY with CHG Soap.   If you chose to wash your hair, wash your hair first as usual with your normal shampoo. After you shampoo, rinse your hair and body thoroughly to remove the shampoo.  Then [employer] and genitals (private parts) with your normal soap and rinse thoroughly to remove soap.  After that Use CHG Soap as you would any other liquid soap. You can apply CHG directly to the skin and wash gently with a scrungie or a clean washcloth.   Apply the CHG Soap to  your body ONLY FROM THE NECK DOWN.  Do not use on open wounds or open sores. Avoid contact with your eyes, ears, mouth and genitals (private parts). Wash Face and genitals (private parts)  with your normal soap.   Wash thoroughly, paying special attention to the area where your surgery will be performed.  Thoroughly rinse your body with warm water from the neck down.  DO NOT shower/wash with your normal soap after using and rinsing off the CHG Soap.  Pat yourself dry with a CLEAN TOWEL.  Wear CLEAN PAJAMAS to bed the night before surgery  Place CLEAN SHEETS on your bed the night before your surgery  DO NOT SLEEP WITH PETS.   Day of Surgery:  Take a shower with CHG soap. Wear Clean/Comfortable clothing the morning of surgery Do not apply any deodorants/lotions.   Remember to brush your teeth WITH YOUR REGULAR TOOTHPASTE.    If you received a COVID test during your pre-op visit, it is requested that you wear a mask when out in public, stay away from anyone that may not be feeling well, and notify your surgeon if you develop symptoms. If you have been in contact with anyone that has tested positive in the last 10 days, please notify your surgeon.    Please read over the following fact sheets that you were given.

## 2022-01-25 ENCOUNTER — Telehealth: Payer: Self-pay | Admitting: *Deleted

## 2022-01-25 ENCOUNTER — Other Ambulatory Visit: Payer: Self-pay

## 2022-01-25 ENCOUNTER — Encounter (HOSPITAL_COMMUNITY): Payer: Self-pay

## 2022-01-25 ENCOUNTER — Encounter (HOSPITAL_COMMUNITY)
Admission: RE | Admit: 2022-01-25 | Discharge: 2022-01-25 | Disposition: A | Discharge status: Home / Self Care | Payer: PPO | Source: Ambulatory Visit | Attending: Surgery | Admitting: Surgery

## 2022-01-25 VITALS — BP 136/76 | HR 87 | Temp 98.2°F | Resp 18 | Ht 68.0 in | Wt 123.6 lb

## 2022-01-25 DIAGNOSIS — Z01818 Encounter for other preprocedural examination: Secondary | ICD-10-CM

## 2022-01-25 DIAGNOSIS — I6523 Occlusion and stenosis of bilateral carotid arteries: Secondary | ICD-10-CM | POA: Insufficient documentation

## 2022-01-25 DIAGNOSIS — Z01812 Encounter for preprocedural laboratory examination: Secondary | ICD-10-CM | POA: Diagnosis not present

## 2022-01-25 HISTORY — DX: Cardiac murmur, unspecified: R01.1

## 2022-01-25 LAB — COMPREHENSIVE METABOLIC PANEL
ALT: 8 U/L (ref 0–44)
AST: 21 U/L (ref 15–41)
Albumin: 3.6 g/dL (ref 3.5–5.0)
Alkaline Phosphatase: 73 U/L (ref 38–126)
Anion gap: 8 (ref 5–15)
BUN: 12 mg/dL (ref 8–23)
CO2: 24 mmol/L (ref 22–32)
Calcium: 8.7 mg/dL — ABNORMAL LOW (ref 8.9–10.3)
Chloride: 104 mmol/L (ref 98–111)
Creatinine, Ser: 0.95 mg/dL (ref 0.61–1.24)
GFR, Estimated: >60 mL/min (ref >60)
Glucose, Bld: 96 mg/dL (ref 70–99)
Potassium: 3.8 mmol/L (ref 3.5–5.1)
Sodium: 136 mmol/L (ref 135–145)
Total Bilirubin: 0.9 mg/dL (ref 0.3–1.2)
Total Protein: 7.4 g/dL (ref 6.5–8.1)

## 2022-01-25 LAB — TYPE AND SCREEN
ABO/RH(D): O NEG
Antibody Screen: NEGATIVE

## 2022-01-25 LAB — CBC
HCT: 42.8 % (ref 39.0–52.0)
Hemoglobin: 13.6 g/dL (ref 13.0–17.0)
MCH: 28.9 pg (ref 26.0–34.0)
MCHC: 31.8 g/dL (ref 30.0–36.0)
MCV: 90.9 fL (ref 80.0–100.0)
Platelets: 211 10*3/uL (ref 150–400)
RBC: 4.71 MIL/uL (ref 4.22–5.81)
RDW: 15.6 % — ABNORMAL HIGH (ref 11.5–15.5)
WBC: 9.3 10*3/uL (ref 4.0–10.5)
nRBC: 0 % (ref 0.0–0.2)

## 2022-01-25 LAB — URINALYSIS, ROUTINE W REFLEX MICROSCOPIC
Bilirubin Urine: NEGATIVE
Glucose, UA: NEGATIVE mg/dL
Hgb urine dipstick: NEGATIVE
Ketones, ur: NEGATIVE mg/dL
Leukocytes,Ua: NEGATIVE
Nitrite: NEGATIVE
Protein, ur: NEGATIVE mg/dL
Specific Gravity, Urine: 1.017 (ref 1.005–1.030)
pH: 5 (ref 5.0–8.0)

## 2022-01-25 LAB — APTT: aPTT: 36 seconds (ref 24–36)

## 2022-01-25 LAB — SURGICAL PCR SCREEN
MRSA, PCR: NEGATIVE
Staphylococcus aureus: NEGATIVE

## 2022-01-25 LAB — PROTIME-INR
INR: 1.1 (ref 0.8–1.2)
Prothrombin Time: 13.7 seconds (ref 11.4–15.2)

## 2022-01-25 NOTE — Progress Notes (Signed)
PCP - Dr. Redmond School Cardiologist - Dr. Rozann Lesches  PPM/ICD - denies   Chest x-ray - 10/05/10 EKG - 07/22/21 Stress Test - denies ECHO - 07/04/2007 Cardiac Cath - denies  Sleep Study - denies  DM- denies  Blood Thinner Instructions: Pt states he was told to hold Plavix for 5 days but not advised on ASA. I told him to f/u with surgeon regarding both medications   ERAS Protcol - no, NPO   COVID TEST- n/a   Anesthesia review: yes, cardiac hx  Patient denies shortness of breath, fever, cough and chest pain at PAT appointment   All instructions explained to the patient, with a verbal understanding of the material. Patient agrees to go over the instructions while at home for a better understanding. The opportunity to ask questions was provided.

## 2022-01-25 NOTE — Telephone Encounter (Signed)
Pt called with question regarding hold 5 day Plavix for CEA, confirmed that Dr.Brabham does want pt. to hold Plavix 5 days prior to CEA.  Phoebe Sharps, RN

## 2022-01-26 NOTE — Progress Notes (Signed)
Anesthesia Chart Review:  Follows with cardiology for history of longstanding permanent A. fib.  He was previously on Coumadin but taken off in 2009 in setting of GI bleed.  Echo 2009 was essentially normal.  He is currently on Plavix secondary to iliac stenting 03/08/19.  Last seen by cardiologist Dr. Domenic Polite 04/17/2020, stable at that time, no changes to management.   Follows with vascular surgery for history of PAD. He is s/p right fem-pop PTFE bypass on 03/12/21 by Dr. Trula Slade with slow healing incisions. He had to return to OR for right groin I&D on 07/22/21 by Dr. Trula Slade.  He is also followed for carotid disease.  Recently found to have critical stenosis on the left as well as occlusive disease on the right.  He was seen by Dr. Donnetta Hutching on 01/05/2022 who recommended CEA.   Preop labs reviewed, unremarkable.    EKG 07/02/2021: Atrial fibrillation.  Rate 82.  RAD.  Low voltage QRS.  Cannot rule out anteroseptal infarct, age undetermined.   CTA neck 01/04/2022: IMPRESSION: Aortic atherosclerosis. Brachiocephalic vessel origins stenoses including 20% stenosis of the innominate artery, 40% stenosis of the left common carotid artery, and 50% stenosis of the left subclavian artery.   20% stenosis of the right common carotid artery origin. Occlusion of the internal carotid artery at its origin at the bifurcation. Severe stenosis of the external carotid artery due to calcified plaque. No reconstitution of the ICA is noted through the skull base.   Dense massive calcified plaque of the distal left common carotid artery just proximal to the bifurcation with luminal diameter 1.7 mm. Dense calcified plaque at the ICA bulb and proximal ECA. Severe stenosis of the ICA in the bulb, with luminal diameter less than 1 mm, consistent with 80% or greater stenosis. Severe stenosis also of the proximal ECA.   50% stenosis of the right vertebral artery origin. 70% stenosis of the left vertebral artery origin.  Serial stenoses of the left vertebral artery V4 segment estimated at 50-70%.  Echo 2009: 1. Technically adequate echocardiographic study.  2. Mild left atrial enlargement; normal right atrial size.  3. The right ventricle is prominent but not clearly enlarged; no RVH;      normal RV systolic function.  4. Trileaflet and normal aortic valve.  Normal proximal ascending      aorta.  5. Normal mitral valve; very mild regurgitation.  6. Normal tricuspid valve; physiologic regurgitation.  7. Normal pulmonic valve and proximal pulmonary artery.  8. Normal left ventricular size; mild hypertrophy; hyperdynamic      regional and global function.  9. Normal IVC.  10.Minimal posterior pericardial effusion      Wynonia Musty [facility] Short Stay Center/Anesthesiology Phone [phone] 01/26/2022 2:40 PM

## 2022-01-26 NOTE — Anesthesia Preprocedure Evaluation (Addendum)
Anesthesia Evaluation  Patient identified by MRN, date of birth, ID band Patient awake    Reviewed: Allergy & Precautions, NPO status , Patient's Chart, lab work & pertinent test results  Airway Mallampati: II  TM Distance: >3 FB Neck ROM: Full    Dental  (+) Dental Advisory Given   Pulmonary Current Smoker,    breath sounds clear to auscultation       Cardiovascular hypertension, Pt. on medications + Peripheral Vascular Disease  + dysrhythmias Atrial Fibrillation  Rhythm:Regular Rate:Normal     Neuro/Psych negative neurological ROS     GI/Hepatic negative GI ROS, Neg liver ROS,   Endo/Other  negative endocrine ROS  Renal/GU negative Renal ROS     Musculoskeletal  (+) Arthritis ,   Abdominal   Peds  Hematology negative hematology ROS (+)   Anesthesia Other Findings   Reproductive/Obstetrics                            Lab Results  Component Value Date   WBC 9.3 01/25/2022   HGB 13.6 01/25/2022   HCT 42.8 01/25/2022   MCV 90.9 01/25/2022   PLT 211 01/25/2022   Lab Results  Component Value Date   CREATININE 0.95 01/25/2022   BUN 12 01/25/2022   NA 136 01/25/2022   K 3.8 01/25/2022   CL 104 01/25/2022   CO2 24 01/25/2022    Anesthesia Physical Anesthesia Plan  ASA: 3  Anesthesia Plan: General   Post-op Pain Management: Tylenol PO (pre-op)*   Induction: Intravenous  PONV Risk Score and Plan: 1 and Dexamethasone, Ondansetron and Treatment may vary due to age or medical condition  Airway Management Planned: Oral ETT  Additional Equipment: Arterial line  Intra-op Plan:   Post-operative Plan: Extubation in OR and Possible Post-op intubation/ventilation  Informed Consent: I have reviewed the patients History and Physical, chart, labs and discussed the procedure including the risks, benefits and alternatives for the proposed anesthesia with the patient or authorized  representative who has indicated his/her understanding and acceptance.     Dental advisory given  Plan Discussed with: CRNA  Anesthesia Plan Comments: (PAT note by Karoline Caldwell, PA-C: Follows with cardiology for history of longstanding permanent A. fib. He was previously on Coumadin but taken off in 2009 in setting of GI bleed. Echo 2009 was essentially normal. He is currently on Plavix secondary to iliac stenting 03/08/19. Last seen by cardiologist Dr. Domenic Polite 04/17/2020, stable at that time, no changes to management.  Follows with vascular surgery for history of PAD. He is s/pright fem-popPTFEbypasson9/16/22by Dr. Matilde Bash slow healing incisions. He had to return toOR for right groin I&Don1/26/23by Dr. Trula Slade. He is also followed for carotid disease.  Recently found to have critical stenosis on the left as well as occlusive disease on the right.  He was seen by Dr. Donnetta Hutching on 01/05/2022 who recommended CEA.  Preop labs reviewed, unremarkable.  EKG 07/02/2021: Atrial fibrillation.  Rate 82.  RAD.  Low voltage QRS.  Cannot rule out anteroseptal infarct, age undetermined.  CTA neck 01/04/2022: IMPRESSION: Aortic atherosclerosis. Brachiocephalic vessel origins stenoses including 20% stenosis of the innominate artery, 40% stenosis of the left common carotid artery, and 50% stenosis of the left subclavian artery.  20% stenosis of the right common carotid artery origin. Occlusion of the internal carotid artery at its origin at the bifurcation. Severe stenosis of the external carotid artery due to calcified plaque. No reconstitution of the ICA is  noted through the skull base.  Dense massive calcified plaque of the distal left common carotid artery just proximal to the bifurcation with luminal diameter 1.7 mm. Dense calcified plaque at the ICA bulb and proximal ECA. Severe stenosis of the ICA in the bulb, with luminal diameter less than 1 mm, consistent with 80% or  greater stenosis. Severe stenosis also of the proximal ECA.  50% stenosis of the right vertebral artery origin. 70% stenosis of the left vertebral artery origin. Serial stenoses of the left vertebral artery V4 segment estimated at 50-70%.  Echo 2009: 1. Technically adequate echocardiographic study. 2. Mild left atrial enlargement; normal right atrial size. 3. The right ventricle is prominent but not clearly enlarged; no RVH; normal RV systolic function. 4. Trileaflet and normal aortic valve. Normal proximal ascending aorta. 5. Normal mitral valve; very mild regurgitation. 6. Normal tricuspid valve; physiologic regurgitation. 7. Normal pulmonic valve and proximal pulmonary artery. 8. Normal left ventricular size; mild hypertrophy; hyperdynamic regional and global function. 9. Normal IVC. 10.Minimal posterior pericardial effusion  )       Anesthesia Quick Evaluation

## 2022-02-02 ENCOUNTER — Encounter (HOSPITAL_COMMUNITY)
Admission: RE | Disposition: A | Discharge status: Home / Self Care | Payer: Self-pay | Source: Home / Self Care | Attending: Surgery

## 2022-02-02 ENCOUNTER — Inpatient Hospital Stay (HOSPITAL_COMMUNITY): Payer: PPO

## 2022-02-02 ENCOUNTER — Other Ambulatory Visit: Payer: Self-pay

## 2022-02-02 ENCOUNTER — Inpatient Hospital Stay (HOSPITAL_COMMUNITY)
Admission: RE | Admit: 2022-02-02 | Discharge: 2022-02-03 | DRG: 039 | Disposition: A | Discharge status: Home / Self Care | Payer: PPO | Attending: Surgery | Admitting: Surgery

## 2022-02-02 ENCOUNTER — Inpatient Hospital Stay (HOSPITAL_COMMUNITY): Payer: PPO | Admitting: Physician Assistant

## 2022-02-02 ENCOUNTER — Encounter (HOSPITAL_COMMUNITY): Payer: Self-pay | Admitting: Surgery

## 2022-02-02 DIAGNOSIS — I4891 Unspecified atrial fibrillation: Secondary | ICD-10-CM | POA: Diagnosis present

## 2022-02-02 DIAGNOSIS — I6523 Occlusion and stenosis of bilateral carotid arteries: Secondary | ICD-10-CM | POA: Diagnosis not present

## 2022-02-02 DIAGNOSIS — I10 Essential (primary) hypertension: Secondary | ICD-10-CM | POA: Diagnosis present

## 2022-02-02 DIAGNOSIS — M549 Dorsalgia, unspecified: Secondary | ICD-10-CM | POA: Diagnosis present

## 2022-02-02 DIAGNOSIS — Z79899 Other long term (current) drug therapy: Secondary | ICD-10-CM | POA: Diagnosis not present

## 2022-02-02 DIAGNOSIS — F17210 Nicotine dependence, cigarettes, uncomplicated: Secondary | ICD-10-CM | POA: Diagnosis not present

## 2022-02-02 DIAGNOSIS — G8929 Other chronic pain: Secondary | ICD-10-CM | POA: Diagnosis not present

## 2022-02-02 DIAGNOSIS — M1990 Unspecified osteoarthritis, unspecified site: Secondary | ICD-10-CM | POA: Diagnosis present

## 2022-02-02 DIAGNOSIS — I6522 Occlusion and stenosis of left carotid artery: Secondary | ICD-10-CM

## 2022-02-02 DIAGNOSIS — Z7902 Long term (current) use of antithrombotics/antiplatelets: Secondary | ICD-10-CM

## 2022-02-02 DIAGNOSIS — Z7982 Long term (current) use of aspirin: Secondary | ICD-10-CM

## 2022-02-02 DIAGNOSIS — I739 Peripheral vascular disease, unspecified: Secondary | ICD-10-CM | POA: Diagnosis present

## 2022-02-02 DIAGNOSIS — F17200 Nicotine dependence, unspecified, uncomplicated: Secondary | ICD-10-CM | POA: Diagnosis not present

## 2022-02-02 DIAGNOSIS — M109 Gout, unspecified: Secondary | ICD-10-CM | POA: Diagnosis present

## 2022-02-02 DIAGNOSIS — I6529 Occlusion and stenosis of unspecified carotid artery: Principal | ICD-10-CM | POA: Diagnosis present

## 2022-02-02 HISTORY — PX: ENDARTERECTOMY: SHX5162

## 2022-02-02 HISTORY — PX: PATCH ANGIOPLASTY: SHX6230

## 2022-02-02 LAB — CBC
HCT: 34.1 % — ABNORMAL LOW (ref 39.0–52.0)
Hemoglobin: 11.4 g/dL — ABNORMAL LOW (ref 13.0–17.0)
MCH: 29.8 pg (ref 26.0–34.0)
MCHC: 33.4 g/dL (ref 30.0–36.0)
MCV: 89.3 fL (ref 80.0–100.0)
Platelets: 178 10*3/uL (ref 150–400)
RBC: 3.82 MIL/uL — ABNORMAL LOW (ref 4.22–5.81)
RDW: 14.4 % (ref 11.5–15.5)
WBC: 10.1 10*3/uL (ref 4.0–10.5)
nRBC: 0 % (ref 0.0–0.2)

## 2022-02-02 LAB — CREATININE, SERUM
Creatinine, Ser: 0.73 mg/dL (ref 0.61–1.24)
GFR, Estimated: >60 mL/min (ref >60)

## 2022-02-02 LAB — POCT ACTIVATED CLOTTING TIME
Activated Clotting Time: 245 seconds
Activated Clotting Time: 275 seconds

## 2022-02-02 SURGERY — ENDARTERECTOMY, CAROTID
Anesthesia: General | Site: Neck | Laterality: Left

## 2022-02-02 MED ORDER — ROCURONIUM BROMIDE 10 MG/ML (PF) SYRINGE
PREFILLED_SYRINGE | INTRAVENOUS | Status: DC | PRN
  Administered 2022-02-02: 10 mg via INTRAVENOUS
  Administered 2022-02-02: 50 mg via INTRAVENOUS
  Administered 2022-02-02: 10 mg via INTRAVENOUS
  Administered 2022-02-02: 5 mg via INTRAVENOUS
  Administered 2022-02-02 (×2): 20 mg via INTRAVENOUS

## 2022-02-02 MED ORDER — DOCUSATE SODIUM 100 MG PO CAPS
100.0000 mg | ORAL_CAPSULE | Freq: Every day | ORAL | Status: DC
  Administered 2022-02-03: 100 mg via ORAL
  Filled 2022-02-02: qty 1

## 2022-02-02 MED ORDER — POTASSIUM CHLORIDE CRYS ER 20 MEQ PO TBCR: 20.0000 meq | EXTENDED_RELEASE_TABLET | Freq: Every day | ORAL | Status: DC | PRN

## 2022-02-02 MED ORDER — LABETALOL HCL 5 MG/ML IV SOLN: 10.0000 mg | INTRAVENOUS | Status: DC | PRN

## 2022-02-02 MED ORDER — ACETAMINOPHEN 500 MG PO TABS
1000.0000 mg | ORAL_TABLET | Freq: Once | ORAL | Status: AC
  Administered 2022-02-02: 1000 mg via ORAL
  Filled 2022-02-02: qty 2

## 2022-02-02 MED ORDER — SODIUM CHLORIDE 0.9 % IV SOLN: 500.0000 mL | Freq: Once | INTRAVENOUS | Status: DC | PRN

## 2022-02-02 MED ORDER — METOPROLOL TARTRATE 5 MG/5ML IV SOLN: 2.0000 mg | INTRAVENOUS | Status: DC | PRN

## 2022-02-02 MED ORDER — ONDANSETRON HCL 4 MG/2ML IJ SOLN
INTRAMUSCULAR | Status: AC
  Filled 2022-02-02: qty 2

## 2022-02-02 MED ORDER — HEPARIN SODIUM (PORCINE) 1000 UNIT/ML IJ SOLN
INTRAMUSCULAR | Status: DC | PRN
  Administered 2022-02-02: 6000 [IU] via INTRAVENOUS

## 2022-02-02 MED ORDER — GUAIFENESIN-DM 100-10 MG/5ML PO SYRP: 15.0000 mL | ORAL_SOLUTION | ORAL | Status: DC | PRN

## 2022-02-02 MED ORDER — SODIUM CHLORIDE 0.9 % IV SOLN: INTRAVENOUS | Status: DC

## 2022-02-02 MED ORDER — HEPARIN SODIUM (PORCINE) 5000 UNIT/ML IJ SOLN
5000.0000 [IU] | Freq: Three times a day (TID) | INTRAMUSCULAR | Status: DC
  Administered 2022-02-03: 5000 [IU] via SUBCUTANEOUS
  Filled 2022-02-02: qty 1

## 2022-02-02 MED ORDER — ALUM & MAG HYDROXIDE-SIMETH 200-200-20 MG/5ML PO SUSP: 15.0000 mL | ORAL | Status: DC | PRN

## 2022-02-02 MED ORDER — LABETALOL HCL 5 MG/ML IV SOLN
INTRAVENOUS | Status: DC | PRN
  Administered 2022-02-02: 5 mg via INTRAVENOUS

## 2022-02-02 MED ORDER — CHLORHEXIDINE GLUCONATE CLOTH 2 % EX PADS: 6.0000 | MEDICATED_PAD | Freq: Once | CUTANEOUS | Status: DC

## 2022-02-02 MED ORDER — NITROGLYCERIN IN D5W 200-5 MCG/ML-% IV SOLN
INTRAVENOUS | Status: AC
  Filled 2022-02-02: qty 250

## 2022-02-02 MED ORDER — HEPARIN 6000 UNIT IRRIGATION SOLUTION
Status: DC | PRN
  Administered 2022-02-02: 1

## 2022-02-02 MED ORDER — SODIUM CHLORIDE 0.9 % IV SOLN
0.1500 ug/kg/min | INTRAVENOUS | Status: AC
  Administered 2022-02-02: .15 ug/kg/min via INTRAVENOUS
  Filled 2022-02-02: qty 2000

## 2022-02-02 MED ORDER — PHENYLEPHRINE HCL-NACL 20-0.9 MG/250ML-% IV SOLN
INTRAVENOUS | Status: DC | PRN
  Administered 2022-02-02: 10 ug/min via INTRAVENOUS

## 2022-02-02 MED ORDER — PANTOPRAZOLE SODIUM 40 MG PO TBEC
40.0000 mg | DELAYED_RELEASE_TABLET | Freq: Every day | ORAL | Status: DC
  Administered 2022-02-03: 40 mg via ORAL
  Filled 2022-02-02: qty 1

## 2022-02-02 MED ORDER — LACTATED RINGERS IV SOLN: INTRAVENOUS | Status: DC | PRN

## 2022-02-02 MED ORDER — FENTANYL CITRATE (PF) 250 MCG/5ML IJ SOLN
INTRAMUSCULAR | Status: AC
  Filled 2022-02-02: qty 5

## 2022-02-02 MED ORDER — FENTANYL CITRATE (PF) 100 MCG/2ML IJ SOLN: 25.0000 ug | INTRAMUSCULAR | Status: DC | PRN

## 2022-02-02 MED ORDER — HEMOSTATIC AGENTS (NO CHARGE) OPTIME
TOPICAL | Status: DC | PRN
  Administered 2022-02-02: 1 via TOPICAL

## 2022-02-02 MED ORDER — ASPIRIN 81 MG PO TBEC
81.0000 mg | DELAYED_RELEASE_TABLET | Freq: Every day | ORAL | Status: DC
  Administered 2022-02-03: 81 mg via ORAL
  Filled 2022-02-02: qty 1

## 2022-02-02 MED ORDER — ENSURE ENLIVE PO LIQD
237.0000 mL | Freq: Every day | ORAL | Status: DC
  Administered 2022-02-03: 237 mL via ORAL

## 2022-02-02 MED ORDER — HYDRALAZINE HCL 20 MG/ML IJ SOLN: 5.0000 mg | INTRAMUSCULAR | Status: DC | PRN

## 2022-02-02 MED ORDER — LIDOCAINE 2% (20 MG/ML) 5 ML SYRINGE
INTRAMUSCULAR | Status: AC
  Filled 2022-02-02: qty 5

## 2022-02-02 MED ORDER — ORAL CARE MOUTH RINSE: 15.0000 mL | Freq: Once | OROMUCOSAL | Status: AC

## 2022-02-02 MED ORDER — HYDROMORPHONE HCL 1 MG/ML IJ SOLN: 0.5000 mg | INTRAMUSCULAR | Status: DC | PRN

## 2022-02-02 MED ORDER — PROPOFOL 10 MG/ML IV BOLUS
INTRAVENOUS | Status: AC
  Filled 2022-02-02: qty 20

## 2022-02-02 MED ORDER — HYDROCODONE-ACETAMINOPHEN 5-325 MG PO TABS: 1.0000 | ORAL_TABLET | ORAL | Status: DC | PRN

## 2022-02-02 MED ORDER — PHENOL 1.4 % MT LIQD
1.0000 | OROMUCOSAL | Status: DC | PRN
Start: 2022-02-02 — End: 2022-02-03

## 2022-02-02 MED ORDER — ONDANSETRON HCL 4 MG/2ML IJ SOLN: 4.0000 mg | Freq: Four times a day (QID) | INTRAMUSCULAR | Status: DC | PRN

## 2022-02-02 MED ORDER — CEFAZOLIN SODIUM-DEXTROSE 2-4 GM/100ML-% IV SOLN
2.0000 g | INTRAVENOUS | Status: AC
  Administered 2022-02-02: 2 g via INTRAVENOUS
  Filled 2022-02-02: qty 100

## 2022-02-02 MED ORDER — ONDANSETRON HCL 4 MG/2ML IJ SOLN
INTRAMUSCULAR | Status: DC | PRN
  Administered 2022-02-02: 4 mg via INTRAVENOUS

## 2022-02-02 MED ORDER — ACETAMINOPHEN 325 MG PO TABS: 325.0000 mg | ORAL_TABLET | ORAL | Status: DC | PRN

## 2022-02-02 MED ORDER — CHLORHEXIDINE GLUCONATE 0.12 % MT SOLN
15.0000 mL | Freq: Once | OROMUCOSAL | Status: AC
  Administered 2022-02-02: 15 mL via OROMUCOSAL
  Filled 2022-02-02: qty 15

## 2022-02-02 MED ORDER — SENNOSIDES-DOCUSATE SODIUM 8.6-50 MG PO TABS: 1.0000 | ORAL_TABLET | Freq: Every evening | ORAL | Status: DC | PRN

## 2022-02-02 MED ORDER — LACTATED RINGERS IV SOLN: INTRAVENOUS | Status: DC

## 2022-02-02 MED ORDER — CEFAZOLIN SODIUM-DEXTROSE 2-4 GM/100ML-% IV SOLN
2.0000 g | Freq: Three times a day (TID) | INTRAVENOUS | Status: AC
  Administered 2022-02-02 – 2022-02-03 (×2): 2 g via INTRAVENOUS
  Filled 2022-02-02 (×2): qty 100

## 2022-02-02 MED ORDER — ROCURONIUM BROMIDE 10 MG/ML (PF) SYRINGE
PREFILLED_SYRINGE | INTRAVENOUS | Status: AC
  Filled 2022-02-02: qty 10

## 2022-02-02 MED ORDER — AMLODIPINE BESYLATE 5 MG PO TABS
2.5000 mg | ORAL_TABLET | Freq: Every day | ORAL | Status: DC
  Administered 2022-02-03: 2.5 mg via ORAL
  Filled 2022-02-02: qty 1

## 2022-02-02 MED ORDER — PROPOFOL 10 MG/ML IV BOLUS
INTRAVENOUS | Status: DC | PRN
  Administered 2022-02-02: 20 mg via INTRAVENOUS
  Administered 2022-02-02: 100 mg via INTRAVENOUS

## 2022-02-02 MED ORDER — BISACODYL 5 MG PO TBEC: 5.0000 mg | DELAYED_RELEASE_TABLET | Freq: Every day | ORAL | Status: DC | PRN

## 2022-02-02 MED ORDER — HEPARIN 6000 UNIT IRRIGATION SOLUTION
Status: AC
  Filled 2022-02-02: qty 500

## 2022-02-02 MED ORDER — LIDOCAINE HCL (PF) 1 % IJ SOLN
INTRAMUSCULAR | Status: AC
  Filled 2022-02-02: qty 30

## 2022-02-02 MED ORDER — ROSUVASTATIN CALCIUM 5 MG PO TABS
10.0000 mg | ORAL_TABLET | Freq: Every day | ORAL | Status: DC
  Administered 2022-02-03: 10 mg via ORAL
  Filled 2022-02-02: qty 2

## 2022-02-02 MED ORDER — LIDOCAINE 2% (20 MG/ML) 5 ML SYRINGE
INTRAMUSCULAR | Status: DC | PRN
  Administered 2022-02-02: 60 mg via INTRAVENOUS

## 2022-02-02 MED ORDER — MAGNESIUM SULFATE 2 GM/50ML IV SOLN: 2.0000 g | Freq: Every day | INTRAVENOUS | Status: DC | PRN

## 2022-02-02 MED ORDER — PROTAMINE SULFATE 10 MG/ML IV SOLN
INTRAVENOUS | Status: DC | PRN
  Administered 2022-02-02: 50 mg via INTRAVENOUS

## 2022-02-02 MED ORDER — FENTANYL CITRATE (PF) 250 MCG/5ML IJ SOLN
INTRAMUSCULAR | Status: DC | PRN
  Administered 2022-02-02 (×2): 50 ug via INTRAVENOUS

## 2022-02-02 MED ORDER — CLEVIDIPINE BUTYRATE 0.5 MG/ML IV EMUL
INTRAVENOUS | Status: AC
  Filled 2022-02-02: qty 50

## 2022-02-02 MED ORDER — ACETAMINOPHEN 650 MG RE SUPP: 325.0000 mg | RECTAL | Status: DC | PRN

## 2022-02-02 MED ORDER — CLOPIDOGREL BISULFATE 75 MG PO TABS
75.0000 mg | ORAL_TABLET | Freq: Every day | ORAL | Status: DC
  Administered 2022-02-03: 75 mg via ORAL
  Filled 2022-02-02: qty 1

## 2022-02-02 MED ORDER — SUGAMMADEX SODIUM 200 MG/2ML IV SOLN
INTRAVENOUS | Status: DC | PRN
  Administered 2022-02-02: 200 mg via INTRAVENOUS

## 2022-02-02 MED ORDER — DEXAMETHASONE SODIUM PHOSPHATE 10 MG/ML IJ SOLN
INTRAMUSCULAR | Status: AC
  Filled 2022-02-02: qty 1

## 2022-02-02 MED ORDER — AMISULPRIDE (ANTIEMETIC) 5 MG/2ML IV SOLN: 10.0000 mg | Freq: Once | INTRAVENOUS | Status: DC | PRN

## 2022-02-02 MED ORDER — 0.9 % SODIUM CHLORIDE (POUR BTL) OPTIME
TOPICAL | Status: DC | PRN
  Administered 2022-02-02: 1000 mL

## 2022-02-02 MED ORDER — DEXAMETHASONE SODIUM PHOSPHATE 10 MG/ML IJ SOLN
INTRAMUSCULAR | Status: DC | PRN
  Administered 2022-02-02: 8 mg via INTRAVENOUS

## 2022-02-02 MED ORDER — PHENYLEPHRINE 80 MCG/ML (10ML) SYRINGE FOR IV PUSH (FOR BLOOD PRESSURE SUPPORT)
PREFILLED_SYRINGE | INTRAVENOUS | Status: DC | PRN
  Administered 2022-02-02: 80 ug via INTRAVENOUS
  Administered 2022-02-02: 160 ug via INTRAVENOUS

## 2022-02-02 SURGICAL SUPPLY — 44 items
BAG COUNTER SPONGE SURGICOUNT (BAG) ×2 IMPLANT
CANISTER SUCT 3000ML PPV (MISCELLANEOUS) ×2 IMPLANT
CATH ROBINSON RED A/P 18FR (CATHETERS) ×2 IMPLANT
CATH SUCT 10FR WHISTLE TIP (CATHETERS) ×2 IMPLANT
CLIP VESOCCLUDE MED 6/CT (CLIP) ×2 IMPLANT
CLIP VESOCCLUDE SM WIDE 6/CT (CLIP) ×2 IMPLANT
COVER PROBE W GEL 5X96 (DRAPES) IMPLANT
DERMABOND ×1 IMPLANT
DERMABOND ADVANCED (GAUZE/BANDAGES/DRESSINGS) ×1
DERMABOND ADVANCED .7 DNX12 (GAUZE/BANDAGES/DRESSINGS) ×1 IMPLANT
DRAIN CHANNEL 15F RND FF W/TCR (WOUND CARE) IMPLANT
ELECT REM PT RETURN 9FT ADLT (ELECTROSURGICAL) ×2
ELECTRODE REM PT RTRN 9FT ADLT (ELECTROSURGICAL) ×1 IMPLANT
EVACUATOR SILICONE 100CC (DRAIN) IMPLANT
GLOVE SURG SS PI 7.5 STRL IVOR (GLOVE) ×6 IMPLANT
GOWN STRL REUS W/ TWL LRG LVL3 (GOWN DISPOSABLE) ×2 IMPLANT
GOWN STRL REUS W/ TWL XL LVL3 (GOWN DISPOSABLE) ×1 IMPLANT
GOWN STRL REUS W/TWL LRG LVL3 (GOWN DISPOSABLE) ×4
GOWN STRL REUS W/TWL XL LVL3 (GOWN DISPOSABLE) ×2
HEMOSTAT SNOW SURGICEL 2X4 (HEMOSTASIS) IMPLANT
INSERT FOGARTY SM (MISCELLANEOUS) IMPLANT
KIT BASIN OR (CUSTOM PROCEDURE TRAY) ×2 IMPLANT
KIT TURNOVER KIT B (KITS) ×2 IMPLANT
NDL HYPO 25GX1X1/2 BEV (NEEDLE) IMPLANT
NEEDLE HYPO 25GX1X1/2 BEV (NEEDLE) IMPLANT
NS IRRIG 1000ML POUR BTL (IV SOLUTION) ×6 IMPLANT
PACK CAROTID (CUSTOM PROCEDURE TRAY) ×2 IMPLANT
PAD ARMBOARD 7.5X6 YLW CONV (MISCELLANEOUS) ×4 IMPLANT
PATCH VASC XENOSURE 1CMX6CM (Vascular Products) ×2 IMPLANT
PATCH VASC XENOSURE 1X6 (Vascular Products) IMPLANT
POSITIONER HEAD DONUT 9IN (MISCELLANEOUS) ×2 IMPLANT
SET WALTER ACTIVATION W/DRAPE (SET/KITS/TRAYS/PACK) IMPLANT
SURGIFLO W/THROMBIN 8M KIT (HEMOSTASIS) ×1 IMPLANT
SUT ETHILON 3 0 PS 1 (SUTURE) IMPLANT
SUT PROLENE 6 0 BV (SUTURE) ×8 IMPLANT
SUT PROLENE 7 0 BV1 MDA (SUTURE) ×2 IMPLANT
SUT SILK 3 0 (SUTURE)
SUT SILK 3-0 18XBRD TIE 12 (SUTURE) IMPLANT
SUT VIC AB 3-0 SH 27 (SUTURE) ×4
SUT VIC AB 3-0 SH 27X BRD (SUTURE) ×2 IMPLANT
SUT VIC AB 3-0 X1 27 (SUTURE) ×2 IMPLANT
SYR CONTROL 10ML LL (SYRINGE) IMPLANT
TOWEL GREEN STERILE (TOWEL DISPOSABLE) ×2 IMPLANT
WATER STERILE IRR 1000ML POUR (IV SOLUTION) ×2 IMPLANT

## 2022-02-02 NOTE — Progress Notes (Signed)
Patient admitted to 4E s/p CEA. VS are stable. CHG bath given. Pt is A/O x4. Tele applied, CCMD called. 0/10 pain. NIH 0. Call light in reach, oriented to room.   Raelyn Number, RN 02/02/22 12:46 PM    02/02/22 1231  Vitals  Temp 98.4 F (36.9 C)  Temp Source Oral  BP 130/78  MAP (mmHg) 93  BP Location Left Arm  BP Method Automatic  Patient Position (if appropriate) Lying  Pulse Rate 85  Pulse Rate Source Monitor  ECG Heart Rate 93  Resp 18  MEWS COLOR  MEWS Score Color Green  Oxygen Therapy  SpO2 95 %  O2 Device Room Air  Pain Assessment  Pain Scale 0-10  Pain Score 0  Height and Weight  Height '5\' 8"'$  (1.727 m)  Weight 57.1 kg  Type of Scale Used Bed  Type of Weight Stated  BSA (Calculated - sq m) 1.66 sq meters  BMI (Calculated) 19.14  Weight in (lb) to have BMI = 25 164.1  MEWS Score  MEWS Temp 0  MEWS Systolic 0  MEWS Pulse 0  MEWS RR 0  MEWS LOC 0  MEWS Score 0

## 2022-02-02 NOTE — Transfer of Care (Signed)
Immediate Anesthesia Transfer of Care Note  Patient: Todd [name]  Procedure(s) Performed: LEFT ENDARTERECTOMY CAROTID (Left: Neck) PATCH ANGIOPLASTY OF LEFT CAROTID USING XENOSURE BOVINE PERICARDIUM PATCH (Left: Neck)  Patient Location: PACU  Anesthesia Type:General  Level of Consciousness: awake, alert  and oriented  Airway & Oxygen Therapy: Patient Spontanous Breathing and Patient connected to nasal cannula oxygen  Post-op Assessment: Report given to RN, Post -op Vital signs reviewed and stable, Patient moving all extremities X 4 and Patient able to stick tongue midline  Post vital signs: Reviewed and stable  Last Vitals:  Vitals Value Taken Time  BP 126/75 02/02/22 1115  Temp    Pulse 87 02/02/22 1117  Resp 18 02/02/22 1117  SpO2 94 % 02/02/22 1117  Vitals shown include unvalidated device data.  Last Pain:  Vitals:   02/02/22 0725  TempSrc:   PainSc: 4          Complications: No notable events documented.

## 2022-02-02 NOTE — Anesthesia Procedure Notes (Signed)
Arterial Line Insertion Start/End8/02/2022 7:30 AM, 02/02/2022 7:32 AM Performed by: Inda Coke, CRNA, CRNA  Patient location: Pre-op. Preanesthetic checklist: patient identified, IV checked, site marked, risks and benefits discussed, surgical consent, monitors and equipment checked, pre-op evaluation, timeout performed and anesthesia consent Lidocaine 1% used for infiltration Left, radial was placed Catheter size: 20 G Hand hygiene performed , maximum sterile barriers used  and Seldinger technique used  Attempts: 1 Procedure performed without using ultrasound guided technique. Following insertion, Biopatch and dressing applied. Post procedure assessment: normal  Patient tolerated the procedure well with no immediate complications. Additional procedure comments: Lynnea Ferrier, [place].

## 2022-02-02 NOTE — Progress Notes (Signed)
  Progress Note    02/02/2022 3:22 PM Day of Surgery  Subjective:  Patient is feeling good, denies pain    Vitals:   02/02/22 1300 02/02/22 1315  BP: 126/72 119/76  Pulse: 91 86  Resp:    Temp:    SpO2: 95% 95%    Physical Exam: Lungs:  nonlabored Incisions:  left sided neck incision c/d/l without hematoma. Minimal swelling Extremities:  can move all extremities equally and freely. Bilateral grip strength equal. Neuro: intact. No facial droop or tongue deviation  CBC    Component Value Date/Time   WBC 10.1 02/02/2022 1200   RBC 3.82 (L) 02/02/2022 1200   HGB 11.4 (L) 02/02/2022 1200   HCT 34.1 (L) 02/02/2022 1200   PLT 178 02/02/2022 1200   MCV 89.3 02/02/2022 1200   MCH 29.8 02/02/2022 1200   MCHC 33.4 02/02/2022 1200   RDW 14.4 02/02/2022 1200   LYMPHSABS 1.3 05/26/2010 1427   MONOABS 0.5 05/26/2010 1427   EOSABS 0.0 05/26/2010 1427   BASOSABS 0.0 05/26/2010 1427    BMET    Component Value Date/Time   NA 136 01/25/2022 1134   K 3.8 01/25/2022 1134   CL 104 01/25/2022 1134   CO2 24 01/25/2022 1134   GLUCOSE 96 01/25/2022 1134   BUN 12 01/25/2022 1134   CREATININE 0.73 02/02/2022 1200   CALCIUM 8.7 (L) 01/25/2022 1134   GFRNONAA >60 02/02/2022 1200   GFRAA  10/06/2010 1259    >60        The eGFR has been calculated using the MDRD equation. This calculation has not been validated in all clinical situations. eGFR's persistently <60 mL/min signify possible Chronic Kidney Disease.    INR    Component Value Date/Time   INR 1.1 01/25/2022 1134     Intake/Output Summary (Last 24 hours) at 02/02/2022 1522 Last data filed at 02/02/2022 1109 Gross per 24 hour  Intake 1350 ml  Output 150 ml  Net 1200 ml     Assessment/Plan:  71 y.o. male is s/p: left CEA today   -Patient is doing well and pain free -Left sided neck incision c/d/l with minimal swelling -Pressure is stable -Neuro intact  Vicente Serene, PA-C [facility] [phone] 02/02/2022 3:22 PM

## 2022-02-02 NOTE — Anesthesia Procedure Notes (Addendum)
Procedure Name: Intubation Date/Time: 02/02/2022 8:45 AM  Performed by: Inda Coke, CRNAPre-anesthesia Checklist: Patient identified, Emergency Drugs available, Suction available and Patient being monitored Patient Re-evaluated:Patient Re-evaluated prior to induction Oxygen Delivery Method: Circle system utilized Preoxygenation: Pre-oxygenation with 100% oxygen Induction Type: IV induction Ventilation: Mask ventilation without difficulty and Oral airway inserted - appropriate to patient size Laryngoscope Size: Sabra Heck and 2 Grade View: Grade I Tube type: Oral Tube size: 7.5 mm Number of attempts: 1 Airway Equipment and Method: Stylet and Oral airway Placement Confirmation: ETT inserted through vocal cords under direct vision, positive ETCO2 and breath sounds checked- equal and bilateral Secured at: 22 cm Tube secured with: Tape Dental Injury: Teeth and Oropharynx as per pre-operative assessment  Comments: Lynnea Ferrier, SRNA

## 2022-02-02 NOTE — Op Note (Signed)
Patient name: Todd [name] MRN: [id number] DOB: 11/05/1950 Sex: male  02/02/2022 Pre-operative Diagnosis: Asymptomatic  left carotid stenosis Post-operative diagnosis:  Same Surgeon:  Annamarie Major Assistants:  Laurence Slate, PA Procedure:    left carotid Endarterectomy with bovine pericardial patch angioplasty Anesthesia:  General Blood Loss:  150 cc Specimens:  none  Findings:  90 %stenosis; Thrombus:  none  Indications: This is a 71 year old gentleman with severe peripheral vascular disease.  He was found to have an occluded right carotid artery and a greater than 80% left carotid stenosis.  This was by CT scan.  His bifurcation was very low and the artery was heavily calcified.  Therefore endarterectomy was recommended.  He is asymptomatic.  He comes in today for surgery.  Risks and benefits of the surgery were discussed with the patient and he wished to proceed.  Procedure:  The patient was identified in the holding area and taken to [facility]  The patient was then placed supine on the table.   General endotrachial anesthesia was administered.  The patient was prepped and draped in the usual sterile fashion.  A time out was called and antibiotics were administered.  A PA was necessary to expedite procedure and assist with technical details.  The PA provided suction to provide exposure.  She followed the suture to assist with the anastomosis.  She also help with skin closure of the skin.  The incision was made along the anterior border of the left sternocleidomastoid muscle.  Cautery was used to dissect through the subcutaneous tissue.  The platysma muscle was divided with cautery.  The internal jugular vein was exposed along its anterior medial border.  The common facial vein was exposed and then divided between 2-0 silk ties and metal clips.  The common carotid artery was then circumferentially exposed and encircled with an umbilical tape.  The vagus nerve was identified and  protected.  Next sharp dissection was used to expose the external carotid artery and the superior thyroid artery.  The were encircled with a blue vessel loop and a 2-0 silk tie respectively.  Finally, the internal carotid was carefully dissected free.  An umbilical tape was placed around the internal carotid artery distal to the diseased segment.  The hypoglossal nerve was visualized throughout and protected.  The patient was given systemic heparinization.  A bovine carotid patch was selected and prepared on the back table.  A 10 french shunt was also prepared.  After blood pressure readings were appropriate and the heparin had been given time to circulate, the internal carotid artery was occluded with a baby Gregory clamp.  The external and common carotid arteries were then occluded with vascular clamps and the 2-0 tie tightened on the superior thyroid artery.  A #11 blade was used to make an arteriotomy in the common carotid artery.  This was extended with Potts scissors along the anterior and lateral border of the common and internal carotid artery.  Approximately 90% stenosis was identified.  There was no thrombus identified.  The 10 french shunt was not placed, as there was adequate backbleeding.  A kleiner kuntz elevator was used to perform endarterectomy.  An eversion endarterectomy was performed in the external carotid artery.  A good distal endpoint was obtained in the internal carotid artery.  The specimen was removed and sent to pathology.  Heparinized saline was used to irrigate the endarterectomized field.  All potential embolic debris was removed.  Bovine pericardial patch angioplasty was  then performed using a running 6-0 Prolene. The common internal and external carotid arteries were all appropriately flushed. The artery was again irrigated with heparin saline.  The anastomosis was then secured. The clamp was first released on the external carotid artery followed by the common carotid artery  approximately 30 seconds later, bloodflow was reestablish through the internal carotid artery.  Next, a hand-held  Doppler was used to evaluate the signals in the common, external, and internal  carotid arteries, all of which had appropriate signals. I then administered  50 mg protamine. The wound was then irrigated.  After hemostasis was achieved, the carotid sheath was reapproximated with 3-0 Vicryl. The  platysma muscle was reapproximated with running 3-0 Vicryl. The skin  was closed with 4-0 Vicryl. Dermabond was placed on the skin. The  patient was then successfully extubated. His neurologic exam was  similar to his preprocedural exam. The patient was then taken to recovery room  in stable condition. There were no complications.     Disposition:  To PACU in stable condition.  Relevant Operative Details: Extensive calcific plaque with multiple areas of mobile debris.  After endarterectomy, the posterior wall of the proximal internal carotid artery was very thin and so I elected to transect approximately 1 cm of the internal carotid artery and closed the posterior wall with a running 7-0 Prolene.  I then performed bovine pericardial patch angioplasty.  Theotis Burrow, M.D., [facility] [place] Office: [phone] Pager:  [phone]

## 2022-02-02 NOTE — Interval H&P Note (Signed)
History and Physical Interval Note:  02/02/2022 8:16 AM  Todd [name]  has presented today for surgery, with the diagnosis of BILATERAL CAROTID ARTERY STENOSIS.  The various methods of treatment have been discussed with the patient and family. After consideration of risks, benefits and other options for treatment, the patient has consented to  Procedure(s): LEFT ENDARTERECTOMY CAROTID (Left) as a surgical intervention.  The patient's history has been reviewed, patient examined, no change in status, stable for surgery.  I have reviewed the patient's chart and labs.  Questions were answered to the patient's satisfaction.     Annamarie Major  I have reviewed the CTA and agree wit the plan of left CEA.  Surgery details discussed with patient who agrees to proceed  WB

## 2022-02-02 NOTE — Plan of Care (Signed)

## 2022-02-03 ENCOUNTER — Encounter (HOSPITAL_COMMUNITY): Payer: Self-pay | Admitting: Surgery

## 2022-02-03 LAB — LIPID PANEL
Cholesterol: 93 mg/dL (ref 0–200)
HDL: 45 mg/dL (ref >40)
LDL Cholesterol: 39 mg/dL (ref 0–99)
Total CHOL/HDL Ratio: 2.1 RATIO
Triglycerides: 43 mg/dL (ref <150)
VLDL: 9 mg/dL (ref 0–40)

## 2022-02-03 LAB — BASIC METABOLIC PANEL
Anion gap: 9 (ref 5–15)
BUN: 11 mg/dL (ref 8–23)
CO2: 20 mmol/L — ABNORMAL LOW (ref 22–32)
Calcium: 7.9 mg/dL — ABNORMAL LOW (ref 8.9–10.3)
Chloride: 101 mmol/L (ref 98–111)
Creatinine, Ser: 0.91 mg/dL (ref 0.61–1.24)
GFR, Estimated: >60 mL/min (ref >60)
Glucose, Bld: 153 mg/dL — ABNORMAL HIGH (ref 70–99)
Potassium: 3.8 mmol/L (ref 3.5–5.1)
Sodium: 130 mmol/L — ABNORMAL LOW (ref 135–145)

## 2022-02-03 LAB — CBC
HCT: 32.1 % — ABNORMAL LOW (ref 39.0–52.0)
Hemoglobin: 10.7 g/dL — ABNORMAL LOW (ref 13.0–17.0)
MCH: 29.5 pg (ref 26.0–34.0)
MCHC: 33.3 g/dL (ref 30.0–36.0)
MCV: 88.4 fL (ref 80.0–100.0)
Platelets: 179 10*3/uL (ref 150–400)
RBC: 3.63 MIL/uL — ABNORMAL LOW (ref 4.22–5.81)
RDW: 14.1 % (ref 11.5–15.5)
WBC: 12.4 10*3/uL — ABNORMAL HIGH (ref 4.0–10.5)
nRBC: 0 % (ref 0.0–0.2)

## 2022-02-03 MED ORDER — HYDROCODONE-ACETAMINOPHEN 5-325 MG PO TABS: 1.0000 | ORAL_TABLET | ORAL | 0 refills | Status: DC | PRN

## 2022-02-03 NOTE — Progress Notes (Signed)
Patient being discharged home.  Patient to be transported by his wife.  IV's removed with the catheters intact.

## 2022-02-03 NOTE — Progress Notes (Addendum)
  Progress Note    02/03/2022 7:49 AM 1 Day Post-Op  Subjective:  Patient is pain free and ready to go home    Vitals:   02/02/22 2300 02/03/22 0409  BP: (!) 152/87 (!) 141/80  Pulse: 88 91  Resp: 17 20  Temp: 98.3 F (36.8 C) 98.3 F (36.8 C)  SpO2: 91% 94%    Physical Exam: Lungs:  nonlabored Incisions:  left sided neck incision c/d/l without hematoma. Minimal swelling Extremities:  moving all extremities equally and freely. Good grip strength Neuro: intact. No facial droop  CBC    Component Value Date/Time   WBC 12.4 (H) 02/03/2022 0317   RBC 3.63 (L) 02/03/2022 0317   HGB 10.7 (L) 02/03/2022 0317   HCT 32.1 (L) 02/03/2022 0317   PLT 179 02/03/2022 0317   MCV 88.4 02/03/2022 0317   MCH 29.5 02/03/2022 0317   MCHC 33.3 02/03/2022 0317   RDW 14.1 02/03/2022 0317   LYMPHSABS 1.3 05/26/2010 1427   MONOABS 0.5 05/26/2010 1427   EOSABS 0.0 05/26/2010 1427   BASOSABS 0.0 05/26/2010 1427    BMET    Component Value Date/Time   NA 130 (L) 02/03/2022 0317   K 3.8 02/03/2022 0317   CL 101 02/03/2022 0317   CO2 20 (L) 02/03/2022 0317   GLUCOSE 153 (H) 02/03/2022 0317   BUN 11 02/03/2022 0317   CREATININE 0.91 02/03/2022 0317   CALCIUM 7.9 (L) 02/03/2022 0317   GFRNONAA >60 02/03/2022 0317   GFRAA  10/06/2010 1259    >60        The eGFR has been calculated using the MDRD equation. This calculation has not been validated in all clinical situations. eGFR's persistently <60 mL/min signify possible Chronic Kidney Disease.    INR    Component Value Date/Time   INR 1.1 01/25/2022 1134     Intake/Output Summary (Last 24 hours) at 02/03/2022 0749 Last data filed at 02/03/2022 0411 Gross per 24 hour  Intake 1890.87 ml  Output 850 ml  Net 1040.87 ml     Assessment/Plan:  71 y.o. male is 1 day post op, s/p: left CEA   -Patient is pain free and set for discharge home today. Patient is to get out of bed before leaving -Left sided neck incision  c/d/l -Neuro intact -Patient will follow up with Dr.Early in 2 weeks   Vicente Serene, PA-C [facility] [phone] 02/03/2022 7:49 AM  Agree with the above Neuro intact incision c/d/I  Plan for discharge  Wells Finbar Nippert

## 2022-02-03 NOTE — Progress Notes (Signed)
Discharge instructions and prescription information given to the patient who verbalized understanding.

## 2022-02-03 NOTE — Anesthesia Postprocedure Evaluation (Signed)
Anesthesia Post Note  Patient: Todd [name]  Procedure(s) Performed: LEFT ENDARTERECTOMY CAROTID (Left: Neck) PATCH ANGIOPLASTY OF LEFT CAROTID USING XENOSURE BOVINE PERICARDIUM PATCH (Left: Neck)     Patient location during evaluation: PACU Anesthesia Type: General Level of consciousness: awake and alert Pain management: pain level controlled Vital Signs Assessment: post-procedure vital signs reviewed and stable Respiratory status: spontaneous breathing, nonlabored ventilation, respiratory function stable and patient connected to nasal cannula oxygen Cardiovascular status: blood pressure returned to baseline and stable Postop Assessment: no apparent nausea or vomiting Anesthetic complications: no   No notable events documented.  Last Vitals:  Vitals:   02/03/22 0758 02/03/22 0815  BP: 139/80 139/80  Pulse: 91 91  Resp: 18 18  Temp: 36.9 C 36.9 C  SpO2: 94% 94%    Last Pain:  Vitals:   02/03/22 0758  TempSrc: Oral  PainSc: 0-No pain                 Tiajuana Amass

## 2022-02-03 NOTE — Plan of Care (Signed)
  Problem: Education: Goal: Knowledge of General Education information will improve Description: Including pain rating scale, medication(s)/side effects and non-pharmacologic comfort measures Outcome: Adequate for Discharge   Problem: Health Behavior/Discharge Planning: Goal: Ability to manage health-related needs will improve Outcome: Adequate for Discharge   Problem: Activity: Goal: Risk for activity intolerance will decrease Outcome: Adequate for Discharge   Problem: Nutrition: Goal: Adequate nutrition will be maintained Outcome: Adequate for Discharge   Problem: Coping: Goal: Level of anxiety will decrease Outcome: Adequate for Discharge   Problem: Elimination: Goal: Will not experience complications related to bowel motility Outcome: Adequate for Discharge Goal: Will not experience complications related to urinary retention Outcome: Adequate for Discharge   Problem: Pain Managment: Goal: General experience of comfort will improve Outcome: Adequate for Discharge   Problem: Safety: Goal: Ability to remain free from injury will improve Outcome: Adequate for Discharge   Problem: Skin Integrity: Goal: Risk for impaired skin integrity will decrease Outcome: Adequate for Discharge

## 2022-02-03 NOTE — Discharge Instructions (Signed)
   [facility] [place]  Discharge Instructions   Carotid Surgery  Please refer to the following instructions for your post-procedure care. Your surgeon or physician assistant will discuss any changes with you.  Activity  You are encouraged to walk as much as you can. You can slowly return to normal activities but must avoid strenuous activity and heavy lifting until your doctor tell you it's okay. Avoid activities such as vacuuming or swinging a golf club. You can drive after one week if you are comfortable and you are no longer taking prescription pain medications. It is normal to feel tired for serval weeks after your surgery. It is also normal to have difficulty with sleep habits, eating, and bowel movements after surgery. These will go away with time.  Bathing/Showering  Shower daily after you go home. Do not soak in a bathtub, hot tub, or swim until the incision heals completely.  Incision Care  Shower every day. Clean your incision with mild soap and water. Pat the area dry with a clean towel. You do not need a bandage unless otherwise instructed. Do not apply any ointments or creams to your incision. You may have skin glue on your incision. Do not peel it off. It will come off on its own in about one week. Your incision may feel thickened and raised for several weeks after your surgery. This is normal and the skin will soften over time.   For Men Only: It's okay to shave around the incision but do not shave the incision itself for 2 weeks. It is common to have numbness under your chin that could last for several months.  Diet  Resume your normal diet. There are no special food restrictions following this procedure. A low fat/low cholesterol diet is recommended for all patients with vascular disease. In order to heal from your surgery, it is CRITICAL to get adequate nutrition. Your body requires vitamins, minerals, and protein. Vegetables are the best source of  vitamins and minerals. Vegetables also provide the perfect balance of protein. Processed food has little nutritional value, so try to avoid this.  Medications  Resume taking all of your medications unless your doctor or physician assistant tells you not to. If your incision is causing pain, you may take over-the- counter pain relievers such as acetaminophen (Tylenol). If you were prescribed a stronger pain medication, please be aware these medications can cause nausea and constipation. Prevent nausea by taking the medication with a snack or meal. Avoid constipation by drinking plenty of fluids and eating foods with a high amount of fiber, such as fruits, vegetables, and grains.   Do not take Tylenol if you are taking prescription pain medications.  Follow Up  Our office will schedule a follow up appointment 2-3 weeks following discharge.  Please call us immediately for any of the following conditions  . Increased pain, redness, drainage (pus) from your incision site. . Fever of 101 degrees or higher. . If you should develop stroke (slurred speech, difficulty swallowing, weakness on one side of your body, loss of vision) you should call 911 and go to the nearest emergency room. .  Reduce your risk of vascular disease:  . Stop smoking. If you would like help call [facility] at 1-800-QUIT-NOW (1-800-784-8669) or [facility] at 336-586-4000. . Manage your cholesterol . Maintain a desired weight . Control your diabetes . Keep your blood pressure down .  If you have any questions, please call the office at [phone]. 

## 2022-02-04 ENCOUNTER — Telehealth: Payer: Self-pay | Admitting: Vascular Surgery

## 2022-02-04 NOTE — Telephone Encounter (Signed)
-----   Message from [facility], [place] sent at 02/03/2022  7:38 AM EDT ----- S/p left carotid endarterectomy, 8/9  Follow up with Dr.Early in 2 weeks

## 2022-02-05 NOTE — Discharge Summary (Signed)
Discharge Summary     Todd [name] [birth year] 71 y.o. male  [id number]  Admission Date: 02/02/2022  Discharge Date: 02/03/2022 Physician: No att. providers found  Admission Diagnosis: Carotid stenosis [I65.29]   HPI:   This is a 71 y.o. male with asymptomatic critical, bilateral carotid disease. He was found to have an occluded right internal carotid artery and greater than 80% left carotid stenosis by CT scan. His bifurcation was low and the left artery was heavily calcified, so endarterectomy was recommended.  Hospital Course:  The patient was admitted to the hospital and taken to the operating room on 02/02/2022 and underwent left carotid endarterectomy with bovine patch angioplasty. During surgery it was found he had 90% stenosis.    The pt tolerated the procedure well and was transported to the PACU in excellent condition.  By POD 1, the pt neuro status was intact. He had no tongue deviation, facial droop, or extremity weakness. No hematoma at the surgical site.   The patient was able to swallow, speak, and ambulate well before discharge.  He was discharged home on 02/04/2022.   Recent Labs    02/03/22 0317  NA 130*  K 3.8  CL 101  CO2 20*  GLUCOSE 153*  BUN 11  CALCIUM 7.9*   Recent Labs    02/02/22 1200 02/03/22 0317  WBC 10.1 12.4*  HGB 11.4* 10.7*  HCT 34.1* 32.1*  PLT 178 179   No results for input(s): "INR" in the last 72 hours.   Discharge Instructions     Call MD for:  redness, tenderness, or signs of infection (pain, swelling, redness, odor or green/yellow discharge around incision site)   Complete by: As directed    Call MD for:  severe uncontrolled pain   Complete by: As directed    Call MD for:  temperature >100.4   Complete by: As directed    Diet - low sodium heart healthy   Complete by: As directed    Discharge wound care:   Complete by: As directed    Keep clean, dry, and, intact. Wash with warm water and mild soap once  daily. Pat dry   Increase activity slowly   Complete by: As directed        Discharge Diagnosis:  Carotid stenosis [I65.29]  Secondary Diagnosis: Patient Active Problem List   Diagnosis Date Noted   Carotid stenosis 02/02/2022   PAD (peripheral artery disease) ([place]) 03/12/2021   Gout    Current smoker    ALCOHOL ABUSE 09/25/2008   Essential hypertension 09/25/2008   Atrial fibrillation ([place]) 09/25/2008   GASTRITIS 09/25/2008   CARDIAC MURMUR, HX OF 09/25/2008   Past Medical History:  Diagnosis Date   Alcoholism ([place])    History of withdrawal and seizures   Atrial fibrillation ([place])    Taken off of Coumadin in 2009 in the setting of GI bleed   Chronic back pain    Colitis    4/11   Essential hypertension    Gout    Heart murmur    History of GI bleed    Internal hemorrhoids    Colonoscopy 11/09   Osteoarthritis    Peripheral vascular disease ([place])    Schatzki's ring    EGD 11/09    Allergies as of 02/03/2022   No Known Allergies      Medication List     STOP taking these medications    sulfamethoxazole-trimethoprim 400-80 MG tablet Commonly known as: BACTRIM  TAKE these medications    amLODipine 5 MG tablet Commonly known as: NORVASC Take 0.5 tablets (2.5 mg total) by mouth daily.   aspirin EC 81 MG tablet Take 1 tablet (81 mg total) by mouth daily at 6 (six) AM. Swallow whole.   clopidogrel 75 MG tablet Commonly known as: PLAVIX TAKE ONE TABLET BY MOUTH ONCE DAILY.   collagenase 250 UNIT/GM ointment Commonly known as: SANTYL Apply 1 application topically daily.   Ensure Take 118.5 mLs by mouth daily with breakfast.   ferrous sulfate 325 (65 FE) MG tablet Take 325 mg by mouth daily.   HYDROcodone-acetaminophen 5-325 MG tablet Commonly known as: Norco Take 1 tablet by mouth every 4 (four) hours as needed for moderate pain. What changed: when to take this   rosuvastatin 10 MG tablet Commonly known as: CRESTOR TAKE (1) TABLET  BY MOUTH ONCE DAILY.               Discharge Care Instructions  (From admission, onward)           Start     Ordered   02/03/22 0000  Discharge wound care:       Comments: Keep clean, dry, and, intact. Wash with warm water and mild soap once daily. Pat dry   02/03/22 0735             [facility] Discharge Instructions Carotid Endarterectomy (CEA)  Please refer to the following instructions for your post-procedure care. Your surgeon or physician assistant will discuss any changes with you.  Activity  You are encouraged to walk as much as you can. You can slowly return to normal activities but must avoid strenuous activity and heavy lifting until your doctor tell you it's OK. Avoid activities such as vacuuming or swinging a golf club. You can drive after one week if you are comfortable and you are no longer taking prescription pain medications. It is normal to feel tired for serval weeks after your surgery. It is also normal to have difficulty with sleep habits, eating, and bowel movements after surgery. These will go away with time.  Bathing/Showering  You may shower after you come home. Do not soak in a bathtub, hot tub, or swim until the incision heals completely.  Incision Care  Shower every day. Clean your incision with mild soap and water. Pat the area dry with a clean towel. You do not need a bandage unless otherwise instructed. Do not apply any ointments or creams to your incision. You may have skin glue on your incision. Do not peel it off. It will come off on its own in about one week. Your incision may feel thickened and raised for several weeks after your surgery. This is normal and the skin will soften over time. For Men Only: It's OK to shave around the incision but do not shave the incision itself for 2 weeks. It is common to have numbness under your chin that could last for several months.  Diet  Resume your normal diet.  There are no special food restrictions following this procedure. A low fat/low cholesterol diet is recommended for all patients with vascular disease. In order to heal from your surgery, it is CRITICAL to get adequate nutrition. Your body requires vitamins, minerals, and protein. Vegetables are the best source of vitamins and minerals. Vegetables also provide the perfect balance of protein. Processed food has little nutritional value, so try to avoid this.  Medications  Resume taking all  of your medications unless your doctor or physician assistant tells you not to.  If your incision is causing pain, you may take over-the- counter pain relievers such as acetaminophen (Tylenol). If you were prescribed a stronger pain medication, please be aware these medications can cause nausea and constipation.  Prevent nausea by taking the medication with a snack or meal. Avoid constipation by drinking plenty of fluids and eating foods with a high amount of fiber, such as fruits, vegetables, and grains.  Do not take Tylenol if you are taking prescription pain medications.  Follow Up  Our office will schedule a follow up appointment 2-3 weeks following discharge.  Please call us immediately for any of the following conditions  Increased pain, redness, drainage (pus) from your incision site. Fever of 101 degrees or higher. If you should develop stroke (slurred speech, difficulty swallowing, weakness on one side of your body, loss of vision) you should call 911 and go to the nearest emergency room.  Reduce your risk of vascular disease:  Stop smoking. If you would like help call [facility] at 1-800-QUIT-NOW (231)281-8087) or [place] at 217-686-4765. Manage your cholesterol Maintain a desired weight Control your diabetes Keep your blood pressure down  If you have any questions, please call the office at [phone].   Disposition: Home  Patient's condition: is Good  Follow up: 1. [facility] in 2  weeks   Vicente Serene, PA-C [facility] [phone]   --- For [facility] Registry use ---   Modified Rankin score at D/C (0-6): 0  IV medication needed for:  1. Hypertension: No 2. Hypotension: No  Post-op Complications: No  1. Post-op CVA or TIA: No  If yes: Event classification (right eye, left eye, right cortical, left cortical, verterobasilar, other):   If yes: Timing of event (intra-op, <6 hrs post-op, >=6 hrs post-op, unknown):   2. CN injury: No  If yes: CN  injuried   3. Myocardial infarction: No  If yes: Dx by (EKG or clinical, Troponin):   4.  CHF: No  5.  Dysrhythmia (new): No  6. Wound infection: No  7. Reperfusion symptoms: No  8. Return to OR: No  If yes: return to OR for (bleeding, neurologic, other CEA incision, other):   Discharge medications: Statin use:  Yes ASA use:  Yes   Beta blocker use:  No ACE-Inhibitor use:  No  ARB use:  No CCB use: Yes P2Y12 Antagonist use: Yes, [x ] Plavix, '[ ]'$  Plasugrel, '[ ]'$  Ticlopinine, '[ ]'$  Ticagrelor, '[ ]'$  Other, '[ ]'$  No for medical reason, '[ ]'$  Non-compliant, '[ ]'$  Not-indicated Anti-coagulant use:  No, '[ ]'$  Warfarin, '[ ]'$  Rivaroxaban, '[ ]'$  Dabigatran,

## 2022-02-16 ENCOUNTER — Ambulatory Visit (INDEPENDENT_AMBULATORY_CARE_PROVIDER_SITE_OTHER): Payer: PPO | Admitting: Vascular Surgery

## 2022-02-16 ENCOUNTER — Encounter: Payer: Self-pay | Admitting: Vascular Surgery

## 2022-02-16 VITALS — BP 121/76 | HR 87 | Temp 97.5°F | Ht 68.0 in | Wt 127.4 lb

## 2022-02-16 DIAGNOSIS — I6523 Occlusion and stenosis of bilateral carotid arteries: Secondary | ICD-10-CM

## 2022-02-16 NOTE — Progress Notes (Signed)
   [facility] [place]  Patient name: Todd [name] MRN: [id number] DOB: 1951-01-18 Sex: male  REASON FOR VISIT: Follow-up recent carotid endarterectomy with Dr. Trula Slade on 02/02/2022  HPI: Todd [name] is a 71 y.o. male today for follow-up.  He is here with his wife.  He did well with surgery and was discharged home on postoperative day 1.  He has had no neurologic deficits.  He reports minimal perioperative discomfort.  Current Outpatient Medications  Medication Sig Dispense Refill   amLODipine (NORVASC) 5 MG tablet Take 0.5 tablets (2.5 mg total) by mouth daily. 45 tablet 3   aspirin EC 81 MG EC tablet Take 1 tablet (81 mg total) by mouth daily at 6 (six) AM. Swallow whole. 30 tablet 11   clopidogrel (PLAVIX) 75 MG tablet TAKE ONE TABLET BY MOUTH ONCE DAILY. 90 tablet 1   Ensure (ENSURE) Take 118.5 mLs by mouth daily with breakfast.     ferrous sulfate 325 (65 FE) MG tablet Take 325 mg by mouth daily.     rosuvastatin (CRESTOR) 10 MG tablet TAKE (1) TABLET BY MOUTH ONCE DAILY. 30 tablet 0   collagenase (SANTYL) ointment Apply 1 application topically daily. (Patient not taking: Reported on 01/20/2022) 30 g 1   HYDROcodone-acetaminophen (NORCO) 5-325 MG tablet Take 1 tablet by mouth every 4 (four) hours as needed for moderate pain. (Patient not taking: Reported on 02/16/2022) 12 tablet 0   No current facility-administered medications for this visit.     PHYSICAL EXAM: Vitals:   02/16/22 0937 02/16/22 0938  BP: 116/73 121/76  Pulse: 87   Temp: (!) 97.5 F (36.4 C)   SpO2: 97%   Weight: 127 lb 6.4 oz (57.8 kg)   Height: '5\' 8"'$  (1.727 m)     GENERAL: The patient is a well-nourished male, in no acute distress. The vital signs are documented above. Left neck incision is well-healed.  He has no bruits bilaterally.  Grossly intact neurologically.  MEDICAL ISSUES: Stable postop left carotid endarterectomy on 02/02/2022.  We  will resume full activities with no limitation.  I will see him again in 9 months with repeat carotid duplex   Rosetta Posner, MD FACS [facility] [phone]  Note: Portions of this report may have been transcribed using voice recognition software.  Every effort has been made to ensure accuracy; however, inadvertent computerized transcription errors may still be present.

## 2022-02-24 ENCOUNTER — Other Ambulatory Visit: Payer: Self-pay

## 2022-02-24 DIAGNOSIS — I6523 Occlusion and stenosis of bilateral carotid arteries: Secondary | ICD-10-CM

## 2022-06-15 ENCOUNTER — Other Ambulatory Visit: Payer: Self-pay | Admitting: Cardiology

## 2022-08-18 NOTE — Progress Notes (Signed)
Cardiology Office Note   Date:  08/19/2022   ID:  GENT, RACK [birth year], MRN [id number]  PCP:  Redmond School, MD  Cardiologist:  Dr. Domenic Polite    Chief Complaint  Patient presents with   Atrial Fibrillation    Chronic atrial fib      History of Present Illness: Todd [name] is a 72 y.o. male who presents for permanent atrial fib.    Hx of permanent atrial fib on lopressor, on Cha2DS2VAsc 3 with hx of GI bleeding on coumadin which was stopped 2009. Marland Kitchen Hx PAD with hx bilateral external iliac artery stenting , HLD on statin. Last OV 04/17/20  in 01/2022 he had left CEA with bovine patch angioplasty.   Today he feels well.  No chest pain, no SOB.  He has not had any issues with his vascular surgeries and is followed by Dr. Donnetta Hutching.   Still smokes half a PPD.  We discussed cutting down to at least a 4th PPD but he was not enthused.  He really enjoys.  He is active with gardening and enjoys spending time with his family  Labs 02/03/22 LDL 39  Cr 0.910 TSH 10/22/21 3.220    Past Medical History:  Diagnosis Date   Alcoholism ([place])    History of withdrawal and seizures   Atrial fibrillation ([place])    Taken off of Coumadin in 2009 in the setting of GI bleed   Chronic back pain    Colitis    4/11   Essential hypertension    Gout    Heart murmur    History of GI bleed    Internal hemorrhoids    Colonoscopy 11/09   Osteoarthritis    Peripheral vascular disease ([place])    Schatzki's ring    EGD 11/09    Past Surgical History:  Procedure Laterality Date   ABDOMINAL AORTOGRAM N/A 03/08/2019   Procedure: ABDOMINAL AORTOGRAM;  Surgeon: Elam Dutch, MD;  Location: [place] [facility];  Service: Cardiovascular;  Laterality: N/A;   ABDOMINAL AORTOGRAM W/LOWER EXTREMITY N/A 03/02/2021   Procedure: ABDOMINAL AORTOGRAM W/LOWER EXTREMITY;  Surgeon: Serafina Mitchell, MD;  Location: [place] [facility];  Service: Cardiovascular;  Laterality: N/A;   APPLICATION OF WOUND VAC  Right 07/22/2021   Procedure: APPLICATION OF WOUND VAC;  Surgeon: Serafina Mitchell, MD;  Location: [facility];  Service: Vascular;  Laterality: Right;   ENDARTERECTOMY Left 02/02/2022   Procedure: LEFT ENDARTERECTOMY CAROTID;  Surgeon: Serafina Mitchell, MD;  Location: [place];  Service: Vascular;  Laterality: Left;   Excision of gouty lesion     Left index finger   FEMORAL-POPLITEAL BYPASS GRAFT Right 03/12/2021   Procedure: RIGHT FEMORAL TO POPLITEAL ARTERY BYPASS GRAFTING USING THE PROPATEN GRAFT;  Surgeon: Serafina Mitchell, MD;  Location: [place];  Service: Vascular;  Laterality: Right;   GROIN DEBRIDEMENT Right 07/22/2021   Procedure: RIGHT GROIN DEBRIDEMENT;  Surgeon: Serafina Mitchell, MD;  [facility];  Service: Vascular;  Laterality: Right;   INSERTION OF ILIAC STENT Right 03/12/2021   Procedure: INSERTION OF  EXTERNAL ILIAC STENT;  Surgeon: Serafina Mitchell, MD;  Location: [facility];  Service: Vascular;  Laterality: Right;   LOWER EXTREMITY ANGIOGRAM Right 03/12/2021   Procedure: LOWER EXTREMITY ANGIOGRAM;  Surgeon: Serafina Mitchell, MD;  Location: [facility];  Service: Vascular;  Laterality: Right;   LOWER EXTREMITY ANGIOGRAPHY Bilateral 03/08/2019   Procedure: Lower Extremity Angiography;  Surgeon: Elam Dutch, MD;  Location: [place] [facility];  Service: Cardiovascular;  Laterality: Bilateral;   LOWER EXTREMITY ANGIOGRAPHY N/A 03/15/2021   Procedure: LOWER EXTREMITY ANGIOGRAPHY;  Surgeon: Waynetta Sandy, MD;  Location: [place] [facility];  Service: Cardiovascular;  Laterality: N/A;   MASS EXCISION Right 07/07/2020   Procedure: EXCISION OF RIGHT FACIAL MASS;  Surgeon: Leta Baptist, MD;  Location: [place];  Service: ENT;  Laterality: Right;   PATCH ANGIOPLASTY Left 02/02/2022   Procedure: PATCH ANGIOPLASTY OF LEFT CAROTID USING XENOSURE BOVINE PERICARDIUM PATCH;  Surgeon: Serafina Mitchell, MD;  Location[facility];  Service: Vascular;  Laterality: Left;   PERIPHERAL VASCULAR BALLOON  ANGIOPLASTY Right 03/02/2021   Procedure: PERIPHERAL VASCULAR BALLOON ANGIOPLASTY;  Surgeon: Serafina Mitchell, MD;  Location: [place] [facility];  Service: Cardiovascular;  Laterality: Right;  external iliac   PERIPHERAL VASCULAR INTERVENTION Bilateral 03/08/2019   Procedure: PERIPHERAL VASCULAR INTERVENTION;  Surgeon: Elam Dutch, MD;  Location: [place] [facility];  Service: Cardiovascular;  Laterality: Bilateral;  ext iliac artery stent   PERIPHERAL VASCULAR INTERVENTION Left 03/02/2021   Procedure: PERIPHERAL VASCULAR INTERVENTION;  Surgeon: Serafina Mitchell, MD;  Location: [place] [facility];  Service: Cardiovascular;  Laterality: Left;  external iliac   PERIPHERAL VASCULAR INTERVENTION Right 03/15/2021   Procedure: PERIPHERAL VASCULAR INTERVENTION;  Surgeon: Waynetta Sandy, MD;  Location: [place] [facility];  Service: Cardiovascular;  Laterality: Right;  external iliac   Right knee arthroscopy     SPINE SURGERY       Current Outpatient Medications  Medication Sig Dispense Refill   amLODipine (NORVASC) 5 MG tablet Take 0.5 tablets (2.5 mg total) by mouth daily. 15 tablet 0   aspirin EC 81 MG EC tablet Take 1 tablet (81 mg total) by mouth daily at 6 (six) AM. Swallow whole. 30 tablet 11   clopidogrel (PLAVIX) 75 MG tablet TAKE ONE TABLET BY MOUTH ONCE DAILY. 90 tablet 1   Ensure (ENSURE) Take 118.5 mLs by mouth daily with breakfast.     rosuvastatin (CRESTOR) 10 MG tablet TAKE (1) TABLET BY MOUTH ONCE DAILY. 30 tablet 0   collagenase (SANTYL) ointment Apply 1 application topically daily. (Patient not taking: Reported on 01/20/2022) 30 g 1   ferrous sulfate 325 (65 FE) MG tablet Take 325 mg by mouth daily. (Patient not taking: Reported on 08/19/2022)     HYDROcodone-acetaminophen (NORCO) 5-325 MG tablet Take 1 tablet by mouth every 4 (four) hours as needed for moderate pain. (Patient not taking: Reported on 08/19/2022) 12 tablet 0   No current facility-administered medications for  this visit.    Allergies:   Patient has no known allergies.    Social History:  The patient  reports that he has been smoking cigarettes. He has been smoking an average of .5 packs per day. He has been exposed to tobacco smoke. He has never used smokeless tobacco. He reports current alcohol use of about 5.0 standard drinks of alcohol per week. He reports that he does not use drugs.   Family History:  The patient's family history includes Coronary artery disease in his father and sister.    ROS:  General:no colds or fevers, no weight changes Skin:no rashes or ulcers HEENT:no blurred vision, no congestion CV:see HPI PUL:see HPI  + tobacco use half a ppd GI:no diarrhea constipation or melena, no indigestion GU:no hematuria, no dysuria MS:no joint pain, no claudication after his surgeryies Neuro:no syncope, no lightheadedness Endo:no diabetes, no thyroid disease  Wt Readings from Last 3 Encounters:  08/19/22 123 lb 9.6 oz (56.1 kg)  02/16/22 127 lb 6.4 oz (57.8 kg)  02/02/22 125 lb 14.1 oz (57.1 kg)     PHYSICAL EXAM: VS:  BP 122/68   Pulse 60   Ht '5\' 8"'$  (1.727 m)   Wt 123 lb 9.6 oz (56.1 kg)   BMI 18.79 kg/m  , BMI Body mass index is 18.79 kg/m. General:Pleasant affect, NAD Skin:Warm and dry, brisk capillary refill HEENT:normocephalic, sclera clear, mucus membranes moist Neck:supple, no JVD, no bruits  Heart  irreg irreg without murmur, gallup, rub or click Lungs:clear without rales, rhonchi, or wheezes [id number], non tender, + BS, do not palpate liver spleen or masses Ext:no lower ext edema, 2+ pedal pulses, 2+ radial pulses Neuro:alert and oriented X 3, MAE, follows commands, + facial symmetry    EKG:  EKG is ordered today. The ekg ordered today demonstrates atrial fib rate of 82 and no acute st changes,  incomplete RBBB and LPFB  I personally reviewed   Recent Labs: 01/25/2022: ALT 8 02/03/2022: BUN 11; Creatinine, Ser 0.91; Hemoglobin 10.7; Platelets 179; Potassium  3.8; Sodium 130    Lipid Panel    Component Value Date/Time   CHOL 93 02/03/2022 0317   TRIG 43 02/03/2022 0317   HDL 45 02/03/2022 0317   CHOLHDL 2.1 02/03/2022 0317   VLDL 9 02/03/2022 0317   LDLCALC 39 02/03/2022 0317       Other studies Reviewed: Additional studies/ records that were reviewed today include: . No recent echo  ASSESSMENT AND PLAN:  1.  Permanent atrial fib - feels well, no awareness of atrial fib.  EKG stable with rate controlled.  No anticoagulation on lopressor.   CHA2DS2-VASc score is 3, as mentioned previously he is not anticoagulated with history of GI bleeding on Coumadin in the past  stable EKG  2.  PAD with bilateral external iliac artery stenting then fem pop bypass 02/2021 on Rt and in 01/2022 left carotid endarterectomy with bovine patch angioplasty with a 90% stenosis.  His RICA is occluded.  3.  Mixed HLD on Crstor continue hospital records and labs reviewed and LDL 39 total chol 93   4.  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  Discussed cutting back to 1/4 PPD.     Current medicines are reviewed with the patient today.  The patient Has no concerns regarding medicines.  The following changes have been made:  See above Labs/ tests ordered today include:see above  Disposition:   FU:  see above  Signed, Cecilie Kicks, NP  08/19/2022 1:52 PM    [facility] [place], [address] [place], [place] Phone: [phone]; Fax: [phone]

## 2022-08-19 ENCOUNTER — Ambulatory Visit: Payer: PPO | Attending: Cardiology | Admitting: Cardiology

## 2022-08-19 ENCOUNTER — Encounter: Payer: Self-pay | Admitting: Cardiology

## 2022-08-19 VITALS — BP 122/68 | HR 60 | Ht 68.0 in | Wt 123.6 lb

## 2022-08-19 DIAGNOSIS — I4891 Unspecified atrial fibrillation: Secondary | ICD-10-CM | POA: Diagnosis not present

## 2022-08-19 DIAGNOSIS — E782 Mixed hyperlipidemia: Secondary | ICD-10-CM | POA: Diagnosis not present

## 2022-08-19 DIAGNOSIS — Z720 Tobacco use: Secondary | ICD-10-CM

## 2022-08-19 DIAGNOSIS — I739 Peripheral vascular disease, unspecified: Secondary | ICD-10-CM

## 2022-08-19 NOTE — Patient Instructions (Signed)
Medication Instructions:   Your physician recommends that you continue on your current medications as directed. Please refer to the Current Medication list given to you today.  *If you need a refill on your cardiac medications before your next appointment, please call your pharmacy*   Lab Work: NONE   If you have labs (blood work) drawn today and your tests are completely normal, you will receive your results only by: [place] (if you have MyChart) OR A paper copy in the mail If you have any lab test that is abnormal or we need to change your treatment, we will call you to review the results.   Testing/Procedures: NONE    Follow-Up: At [facility], you and your health needs are our priority.  As part of our continuing mission to provide you with exceptional heart care, we have created designated Provider Care Teams.  These Care Teams include your primary Cardiologist (physician) and Advanced Practice Providers (APPs -  Physician Assistants and Nurse Practitioners) who all work together to provide you with the care you need, when you need it.  We recommend signing up for the patient portal called "MyChart".  Sign up information is provided on this After Visit Summary.  MyChart is used to connect with patients for Virtual Visits (Telemedicine).  Patients are able to view lab/test results, encounter notes, upcoming appointments, etc.  Non-urgent messages can be sent to your provider as well.   To learn more about what you can do with MyChart, go to [email or web address].    Your next appointment:   8 month(s)  Provider:   You may see Rozann Lesches, MD or one of the following Advanced Practice Providers on your designated Care Team:   Bernerd Pho, PA-C  Ermalinda Barrios, PA-C     Other Instructions Thank you for choosing [facility]!

## 2022-08-29 DIAGNOSIS — H524 Presbyopia: Secondary | ICD-10-CM | POA: Diagnosis not present

## 2022-08-29 DIAGNOSIS — Z961 Presence of intraocular lens: Secondary | ICD-10-CM | POA: Diagnosis not present

## 2022-09-12 ENCOUNTER — Other Ambulatory Visit: Payer: Self-pay | Admitting: Cardiology

## 2022-10-17 ENCOUNTER — Encounter (HOSPITAL_COMMUNITY): Payer: Self-pay

## 2022-10-17 ENCOUNTER — Other Ambulatory Visit: Payer: Self-pay

## 2022-10-17 ENCOUNTER — Emergency Department (HOSPITAL_COMMUNITY): Payer: PPO

## 2022-10-17 ENCOUNTER — Inpatient Hospital Stay (HOSPITAL_COMMUNITY)
Admission: EM | Admit: 2022-10-17 | Discharge: 2022-10-20 | DRG: 378 | Disposition: A | Discharge status: Home / Self Care | Payer: PPO | Attending: Family Medicine | Admitting: Family Medicine

## 2022-10-17 DIAGNOSIS — Z681 Body mass index (BMI) 19 or less, adult: Secondary | ICD-10-CM | POA: Diagnosis not present

## 2022-10-17 DIAGNOSIS — Z7902 Long term (current) use of antithrombotics/antiplatelets: Secondary | ICD-10-CM | POA: Diagnosis not present

## 2022-10-17 DIAGNOSIS — I700 Atherosclerosis of aorta: Secondary | ICD-10-CM | POA: Diagnosis not present

## 2022-10-17 DIAGNOSIS — K922 Gastrointestinal hemorrhage, unspecified: Secondary | ICD-10-CM | POA: Diagnosis not present

## 2022-10-17 DIAGNOSIS — I10 Essential (primary) hypertension: Secondary | ICD-10-CM | POA: Diagnosis not present

## 2022-10-17 DIAGNOSIS — I701 Atherosclerosis of renal artery: Secondary | ICD-10-CM | POA: Diagnosis present

## 2022-10-17 DIAGNOSIS — F1020 Alcohol dependence, uncomplicated: Secondary | ICD-10-CM | POA: Diagnosis present

## 2022-10-17 DIAGNOSIS — E8809 Other disorders of plasma-protein metabolism, not elsewhere classified: Secondary | ICD-10-CM | POA: Diagnosis not present

## 2022-10-17 DIAGNOSIS — E46 Unspecified protein-calorie malnutrition: Secondary | ICD-10-CM

## 2022-10-17 DIAGNOSIS — E782 Mixed hyperlipidemia: Secondary | ICD-10-CM

## 2022-10-17 DIAGNOSIS — K921 Melena: Secondary | ICD-10-CM

## 2022-10-17 DIAGNOSIS — D72829 Elevated white blood cell count, unspecified: Secondary | ICD-10-CM | POA: Diagnosis not present

## 2022-10-17 DIAGNOSIS — E876 Hypokalemia: Secondary | ICD-10-CM | POA: Diagnosis present

## 2022-10-17 DIAGNOSIS — Z1152 Encounter for screening for COVID-19: Secondary | ICD-10-CM

## 2022-10-17 DIAGNOSIS — Z7982 Long term (current) use of aspirin: Secondary | ICD-10-CM | POA: Diagnosis not present

## 2022-10-17 DIAGNOSIS — I771 Stricture of artery: Secondary | ICD-10-CM | POA: Insufficient documentation

## 2022-10-17 DIAGNOSIS — K2970 Gastritis, unspecified, without bleeding: Secondary | ICD-10-CM | POA: Diagnosis not present

## 2022-10-17 DIAGNOSIS — R935 Abnormal findings on diagnostic imaging of other abdominal regions, including retroperitoneum: Secondary | ICD-10-CM | POA: Diagnosis not present

## 2022-10-17 DIAGNOSIS — E872 Acidosis, unspecified: Secondary | ICD-10-CM

## 2022-10-17 DIAGNOSIS — R531 Weakness: Secondary | ICD-10-CM | POA: Diagnosis not present

## 2022-10-17 DIAGNOSIS — K2971 Gastritis, unspecified, with bleeding: Secondary | ICD-10-CM | POA: Diagnosis not present

## 2022-10-17 DIAGNOSIS — E440 Moderate protein-calorie malnutrition: Secondary | ICD-10-CM | POA: Diagnosis present

## 2022-10-17 DIAGNOSIS — I6523 Occlusion and stenosis of bilateral carotid arteries: Secondary | ICD-10-CM | POA: Diagnosis present

## 2022-10-17 DIAGNOSIS — K551 Chronic vascular disorders of intestine: Secondary | ICD-10-CM | POA: Insufficient documentation

## 2022-10-17 DIAGNOSIS — K254 Chronic or unspecified gastric ulcer with hemorrhage: Principal | ICD-10-CM | POA: Diagnosis present

## 2022-10-17 DIAGNOSIS — E871 Hypo-osmolality and hyponatremia: Secondary | ICD-10-CM | POA: Diagnosis present

## 2022-10-17 DIAGNOSIS — K861 Other chronic pancreatitis: Secondary | ICD-10-CM | POA: Diagnosis not present

## 2022-10-17 DIAGNOSIS — I959 Hypotension, unspecified: Secondary | ICD-10-CM | POA: Diagnosis not present

## 2022-10-17 DIAGNOSIS — Z8249 Family history of ischemic heart disease and other diseases of the circulatory system: Secondary | ICD-10-CM | POA: Diagnosis not present

## 2022-10-17 DIAGNOSIS — D62 Acute posthemorrhagic anemia: Secondary | ICD-10-CM | POA: Diagnosis not present

## 2022-10-17 DIAGNOSIS — I4891 Unspecified atrial fibrillation: Secondary | ICD-10-CM | POA: Diagnosis present

## 2022-10-17 DIAGNOSIS — D696 Thrombocytopenia, unspecified: Secondary | ICD-10-CM | POA: Diagnosis not present

## 2022-10-17 DIAGNOSIS — Z79899 Other long term (current) drug therapy: Secondary | ICD-10-CM

## 2022-10-17 DIAGNOSIS — F17210 Nicotine dependence, cigarettes, uncomplicated: Secondary | ICD-10-CM | POA: Diagnosis present

## 2022-10-17 DIAGNOSIS — L03116 Cellulitis of left lower limb: Secondary | ICD-10-CM | POA: Insufficient documentation

## 2022-10-17 DIAGNOSIS — K259 Gastric ulcer, unspecified as acute or chronic, without hemorrhage or perforation: Secondary | ICD-10-CM | POA: Diagnosis not present

## 2022-10-17 DIAGNOSIS — I739 Peripheral vascular disease, unspecified: Secondary | ICD-10-CM | POA: Diagnosis present

## 2022-10-17 LAB — CBC WITH DIFFERENTIAL/PLATELET
Abs Immature Granulocytes: 0.1 10*3/uL — ABNORMAL HIGH (ref 0.00–0.07)
Basophils Absolute: 0.1 10*3/uL (ref 0.0–0.1)
Basophils Relative: 1 %
Eosinophils Absolute: 0 10*3/uL (ref 0.0–0.5)
Eosinophils Relative: 0 %
HCT: 31.2 % — ABNORMAL LOW (ref 39.0–52.0)
Hemoglobin: 10 g/dL — ABNORMAL LOW (ref 13.0–17.0)
Immature Granulocytes: 1 %
Lymphocytes Relative: 10 %
Lymphs Abs: 1.6 10*3/uL (ref 0.7–4.0)
MCH: 31.2 pg (ref 26.0–34.0)
MCHC: 32.1 g/dL (ref 30.0–36.0)
MCV: 97.2 fL (ref 80.0–100.0)
Monocytes Absolute: 0.7 10*3/uL (ref 0.1–1.0)
Monocytes Relative: 5 %
Neutro Abs: 12.7 10*3/uL — ABNORMAL HIGH (ref 1.7–7.7)
Neutrophils Relative %: 83 %
Platelets: 215 10*3/uL (ref 150–400)
RBC: 3.21 MIL/uL — ABNORMAL LOW (ref 4.22–5.81)
RDW: 14.2 % (ref 11.5–15.5)
WBC: 15.2 10*3/uL — ABNORMAL HIGH (ref 4.0–10.5)
nRBC: 0 % (ref 0.0–0.2)

## 2022-10-17 LAB — RESP PANEL BY RT-PCR (RSV, FLU A&B, COVID)  RVPGX2
Influenza A by PCR: NEGATIVE
Influenza B by PCR: NEGATIVE
Resp Syncytial Virus by PCR: NEGATIVE
SARS Coronavirus 2 by RT PCR: NEGATIVE

## 2022-10-17 LAB — POC OCCULT BLOOD, ED

## 2022-10-17 LAB — BPAM RBC
Blood Product Expiration Date: 202405022359
ISSUE DATE / TIME: 202404221346

## 2022-10-17 LAB — LIPASE, BLOOD: Lipase: 20 U/L (ref 11–51)

## 2022-10-17 LAB — LACTIC ACID, PLASMA
Lactic Acid, Venous: 2.9 mmol/L (ref 0.5–1.9)
Lactic Acid, Venous: 1.5 mmol/L (ref 0.5–1.9)

## 2022-10-17 LAB — COMPREHENSIVE METABOLIC PANEL
ALT: 10 U/L (ref 0–44)
AST: 20 U/L (ref 15–41)
Albumin: 2.5 g/dL — ABNORMAL LOW (ref 3.5–5.0)
Alkaline Phosphatase: 65 U/L (ref 38–126)
Anion gap: 11 (ref 5–15)
BUN: 22 mg/dL (ref 8–23)
CO2: 22 mmol/L (ref 22–32)
Calcium: 7.8 mg/dL — ABNORMAL LOW (ref 8.9–10.3)
Chloride: 106 mmol/L (ref 98–111)
Creatinine, Ser: 1.01 mg/dL (ref 0.61–1.24)
GFR, Estimated: >60 mL/min (ref >60)
Glucose, Bld: 156 mg/dL — ABNORMAL HIGH (ref 70–99)
Potassium: 4.1 mmol/L (ref 3.5–5.1)
Sodium: 139 mmol/L (ref 135–145)
Total Bilirubin: 0.5 mg/dL (ref 0.3–1.2)
Total Protein: 5.8 g/dL — ABNORMAL LOW (ref 6.5–8.1)

## 2022-10-17 LAB — CULTURE, BLOOD (ROUTINE X 2): Special Requests: ADEQUATE

## 2022-10-17 LAB — PREPARE RBC (CROSSMATCH)

## 2022-10-17 LAB — MAGNESIUM: Magnesium: 1.7 mg/dL (ref 1.7–2.4)

## 2022-10-17 LAB — PROTIME-INR
INR: 1.2 (ref 0.8–1.2)
Prothrombin Time: 14.8 seconds (ref 11.4–15.2)

## 2022-10-17 MED ORDER — ACETAMINOPHEN 325 MG PO TABS: 650.0000 mg | ORAL_TABLET | Freq: Four times a day (QID) | ORAL | Status: DC | PRN

## 2022-10-17 MED ORDER — SODIUM CHLORIDE 0.9 % IV SOLN
2.0000 g | Freq: Once | INTRAVENOUS | Status: AC
  Administered 2022-10-17: 2 g via INTRAVENOUS
  Filled 2022-10-17: qty 20

## 2022-10-17 MED ORDER — LACTATED RINGERS IV BOLUS
1000.0000 mL | Freq: Once | INTRAVENOUS | Status: AC
  Administered 2022-10-17: 1000 mL via INTRAVENOUS

## 2022-10-17 MED ORDER — ONDANSETRON HCL 4 MG PO TABS: 4.0000 mg | ORAL_TABLET | Freq: Four times a day (QID) | ORAL | Status: DC | PRN

## 2022-10-17 MED ORDER — SODIUM CHLORIDE 0.9 % IV BOLUS
1000.0000 mL | Freq: Once | INTRAVENOUS | Status: AC
  Administered 2022-10-17: 1000 mL via INTRAVENOUS

## 2022-10-17 MED ORDER — LACTATED RINGERS IV SOLN: INTRAVENOUS | Status: AC

## 2022-10-17 MED ORDER — PANTOPRAZOLE SODIUM 40 MG IV SOLR
40.0000 mg | Freq: Two times a day (BID) | INTRAVENOUS | Status: DC
  Administered 2022-10-17 – 2022-10-20 (×7): 40 mg via INTRAVENOUS
  Filled 2022-10-17 (×7): qty 10

## 2022-10-17 MED ORDER — ENSURE ENLIVE PO LIQD
237.0000 mL | Freq: Two times a day (BID) | ORAL | Status: DC
  Administered 2022-10-18: 237 mL via ORAL

## 2022-10-17 MED ORDER — ONDANSETRON HCL 4 MG/2ML IJ SOLN: 4.0000 mg | Freq: Four times a day (QID) | INTRAMUSCULAR | Status: DC | PRN

## 2022-10-17 MED ORDER — IOHEXOL 350 MG/ML SOLN
100.0000 mL | Freq: Once | INTRAVENOUS | Status: AC | PRN
  Administered 2022-10-17: 75 mL via INTRAVENOUS

## 2022-10-17 MED ORDER — AMLODIPINE BESYLATE 5 MG PO TABS
2.5000 mg | ORAL_TABLET | Freq: Every day | ORAL | Status: DC
  Administered 2022-10-18: 2.5 mg via ORAL
  Filled 2022-10-17: qty 1

## 2022-10-17 MED ORDER — ACETAMINOPHEN 650 MG RE SUPP: 650.0000 mg | Freq: Four times a day (QID) | RECTAL | Status: DC | PRN

## 2022-10-17 MED ORDER — ROSUVASTATIN CALCIUM 20 MG PO TABS
10.0000 mg | ORAL_TABLET | Freq: Every day | ORAL | Status: DC
  Administered 2022-10-18 – 2022-10-19 (×2): 10 mg via ORAL
  Filled 2022-10-17 (×2): qty 1

## 2022-10-17 MED ORDER — SODIUM CHLORIDE 0.9% IV SOLUTION: Freq: Once | INTRAVENOUS | Status: AC

## 2022-10-17 NOTE — ED Provider Notes (Addendum)
I spoke with gastroenterologist Dr. Marletta Lor about the CT findings.  And he stated the patient should be admitted to medicine and kept on clear liquids and he will be consulted on tomorrow by GI.  He also needs Protonix IV twice daily   Bethann Berkshire, MD 10/17/22 Peggyann Shoals, MD 10/17/22 [phone]

## 2022-10-17 NOTE — ED Provider Notes (Signed)
[place] EMERGENCY DEPARTMENT AT [facility] Provider Note   CSN: [id number] Arrival date & time: 10/17/22  4098     History  Chief Complaint  Patient presents with   Blood In Stools    Todd [name] is a 72 y.o. male.  HPI Patient presents for hematochezia.  Medical history includes alcoholism, atrial fibrillation HTN, gout, prior GI bleed, arthritis, PVD.  He is not on anticoagulation.  He does take Plavix.  He did get his dose of Plavix this morning.  He also took 2.5 mg of amlodipine.  Patient had frequent bowel movements since last night.  He describes these as dark in color.  This morning, he had increased evidence of dark red blood in the toilet.  He had an estimated 5 episodes this morning.  He has had worsening generalized weakness, to the point that he could no longer walk.  Currently, he endorses feeling cold and generalized weakness.  He denies any areas of current pain.    Home Medications Prior to Admission medications   Medication Sig Start Date End Date Taking? Authorizing Provider  amLODipine (NORVASC) 5 MG tablet Take 0.5 tablets (2.5 mg total) by mouth daily. 09/12/22 09/07/23 Yes Jonelle Sidle, MD  aspirin EC 81 MG EC tablet Take 1 tablet (81 mg total) by mouth daily at 6 (six) AM. Swallow whole. 03/17/21  Yes Setzer, Lynnell Jude, PA-C  clopidogrel (PLAVIX) 75 MG tablet TAKE ONE TABLET BY MOUTH ONCE DAILY. 01/06/22  Yes Jonelle Sidle, MD  rosuvastatin (CRESTOR) 10 MG tablet TAKE (1) TABLET BY MOUTH ONCE DAILY. 09/09/20  Yes Maeola Harman, MD      Allergies    Patient has no known allergies.    Review of Systems   Review of Systems  Constitutional:  Positive for chills and fatigue.  Gastrointestinal:  Positive for blood in stool.  Neurological:  Positive for weakness (Generalized).  All other systems reviewed and are negative.   Physical Exam Updated Vital Signs BP 101/65   Pulse 91   Temp 98.8 F (37.1 C)   Resp (!) 21    Ht 5\' 6"  (1.676 m)   Wt 56.1 kg   SpO2 100%   BMI 19.96 kg/m  Physical Exam Vitals and nursing note reviewed. Exam conducted with a chaperone present.  Constitutional:      General: He is not in acute distress.    Appearance: Normal appearance. He is well-developed. He is not ill-appearing, toxic-appearing or diaphoretic.  HENT:     Head: Normocephalic and atraumatic.     Right Ear: External ear normal.     Left Ear: External ear normal.     Nose: Nose normal.     Mouth/Throat:     Mouth: Mucous membranes are moist.  Eyes:     Extraocular Movements: Extraocular movements intact.     Conjunctiva/sclera: Conjunctivae normal.  Cardiovascular:     Rate and Rhythm: Normal rate. Rhythm irregular.  Pulmonary:     Effort: Pulmonary effort is normal. No respiratory distress.  Abdominal:     General: There is no distension.     Palpations: Abdomen is soft.     Tenderness: There is no abdominal tenderness.  Genitourinary:    Comments: Dried blood present in perirectal area. Musculoskeletal:        General: No swelling. Normal range of motion.     Cervical back: Normal range of motion and neck supple.     Right lower leg: No  edema.     Left lower leg: No edema.  Skin:    General: Skin is warm and dry.     Capillary Refill: Capillary refill takes more than 3 seconds.     Coloration: Skin is pale.  Neurological:     General: No focal deficit present.     Mental Status: He is alert and oriented to person, place, and time.  Psychiatric:        Mood and Affect: Mood normal.        Behavior: Behavior normal.     ED Results / Procedures / Treatments   Labs (all labs ordered are listed, but only abnormal results are displayed) Labs Reviewed  COMPREHENSIVE METABOLIC PANEL - Abnormal; Notable for the following components:      Result Value   Glucose, Bld 156 (*)    Calcium 7.8 (*)    Total Protein 5.8 (*)    Albumin 2.5 (*)    All other components within normal limits  CBC WITH  DIFFERENTIAL/PLATELET - Abnormal; Notable for the following components:   WBC 15.2 (*)    RBC 3.21 (*)    Hemoglobin 10.0 (*)    HCT 31.2 (*)    Neutro Abs 12.7 (*)    Abs Immature Granulocytes 0.10 (*)    All other components within normal limits  LACTIC ACID, PLASMA - Abnormal; Notable for the following components:   Lactic Acid, Venous 2.9 (*)    All other components within normal limits  POC OCCULT BLOOD, ED - Abnormal  CULTURE, BLOOD (ROUTINE X 2)  CULTURE, BLOOD (ROUTINE X 2)  RESP PANEL BY RT-PCR (RSV, FLU A&B, COVID)  RVPGX2  LIPASE, BLOOD  MAGNESIUM  LACTIC ACID, PLASMA  PROTIME-INR  URINALYSIS, ROUTINE W REFLEX MICROSCOPIC  OCCULT BLOOD X 1 CARD TO LAB, STOOL  I-STAT CHEM 8, ED  TYPE AND SCREEN  PREPARE RBC (CROSSMATCH)    EKG EKG Interpretation  Date/Time:  Monday October 17 2022 11:58:51 EDT Ventricular Rate:  79 PR Interval:    QRS Duration: 96 QT Interval:  394 QTC Calculation: 441 R Axis:   132 Text Interpretation: Atrial fibrillation Abnormal lateral Q waves Anterior infarct, old Minimal ST depression, inferior leads Baseline wander in lead(s) I [name] aVL Confirmed by Gloris Manchester [phone]) on 10/17/2022 12:18:04 PM  Radiology DG Chest Port 1 View  Result Date: 10/17/2022 CLINICAL DATA:  Blood in stool, questionable sepsis EXAM: PORTABLE CHEST 1 VIEW COMPARISON:  10/13/2009 FINDINGS: Cardiac and mediastinal contours are within normal limits given AP technique. No focal pulmonary opacity. No pleural effusion or pneumothorax. No acute osseous abnormality. IMPRESSION: No acute cardiopulmonary process. Electronically Signed   By: Wiliam Ke M.D.   On: 10/17/2022 12:55    Procedures Procedures    Medications Ordered in ED Medications  lactated ringers infusion ( Intravenous New Bag/Given 10/17/22 1430)  pantoprazole (PROTONIX) injection 40 mg (40 mg Intravenous Given 10/17/22 1429)  cefTRIAXone (ROCEPHIN) 2 g in sodium chloride 0.9 % 100 mL IVPB (has no  administration in time range)  lactated ringers bolus 1,000 mL (0 mLs Intravenous Stopped 10/17/22 1322)  0.9 %  sodium chloride infusion (Manually program via Guardrails IV Fluids) ( Intravenous New Bag/Given 10/17/22 1405)  iohexol (OMNIPAQUE) 350 MG/ML injection 100 mL (75 mLs Intravenous Contrast Given 10/17/22 1610)    ED Course/ Medical Decision Making/ A&P  Medical Decision Making Amount and/or Complexity of Data Reviewed Labs: ordered. Radiology: ordered.  Risk Prescription drug management.   This patient presents to the ED for concern of hematochezia and generalized weakness, this involves an extensive number of treatment options, and is a complaint that carries with it a high risk of complications and morbidity.  The differential diagnosis includes blood loss anemia, infection, sepsis, hemorrhoids, diverticular bleed, upper GI bleed   Co morbidities that complicate the patient evaluation  alcoholism, atrial fibrillation HTN, gout, prior GI bleed, arthritis, PVD   Additional history obtained:  Additional history obtained from patient's wife External records from outside source obtained and reviewed including EMR   Lab Tests:  I Ordered, and personally interpreted labs.  The pertinent results include: Leukocytosis is present.  No acute drop in hemoglobin is evident.  Initial lactate was elevated but normalized after fluids.   Imaging Studies ordered:  I ordered imaging studies including chest x-ray, CTA of abdomen and pelvis I independently visualized and interpreted imaging which showed no acute findings on x-ray.  CT scan results are pending at time of signout. I agree with the radiologist interpretation   Cardiac Monitoring: / EKG:  The patient was maintained on a cardiac monitor.  I personally viewed and interpreted the cardiac monitored which showed an underlying rhythm of: Atrial fibrillation  Problem List / ED Course / Critical  interventions / Medication management  Patient presents for hematochezia.  It is unclear if this began last night or this morning.  Patient has been having frequent bowel movements since last night.  He had progressive generalized weakness this morning to the point that he can no longer walk.  On arrival in the ED, patient is hypotensive.  On exam, he is pale and ill-appearing.  He is alert and oriented.  Heart rate is normal and the EKG shows slow A-fib.  He is found to be mildly hypothermic.  Rectal exam was performed with nurse chaperone present.  There is dried blood in his perirectal area.  I suspect hypotension secondary to blood loss anemia.  1 unit emergency release PRBCs and IV fluids were ordered.  Warming blanket to be applied.  Laboratory workup was initiated.  On CBC, there was no drop in hemoglobin evident.  A leukocytosis was present.  Additional workup was initiated for possible sepsis that coincides with his hematochezia.  At time of signout, CT of abdomen and pelvis was pending.  Care of patient was signed out to oncoming ED provider. I ordered medication including IV fluids and PRBCs for hypotension in the setting of hematochezia; Protonix for empiric treatment of UGIB Reevaluation of the patient after these medicines showed that the patient improved I have reviewed the patients home medicines and have made adjustments as needed   Social Determinants of Health:  Lives at home with wife  CRITICAL CARE Performed by: Gloris Manchester   Total critical care time: 32 minutes  Critical care time was exclusive of separately billable procedures and treating other patients.  Critical care was necessary to treat or prevent imminent or life-threatening deterioration.  Critical care was time spent personally by me on the following activities: development of treatment plan with patient and/or surrogate as well as nursing, discussions with consultants, evaluation of patient's response to  treatment, examination of patient, obtaining history from patient or surrogate, ordering and performing treatments and interventions, ordering and review of laboratory studies, ordering and review of radiographic studies, pulse oximetry and re-evaluation of patient's condition.  Final Clinical Impression(s) / ED Diagnoses Final diagnoses:  Hypotension, unspecified hypotension type  Hematochezia    Rx / DC Orders ED Discharge Orders     None         Gloris Manchester, MD 10/17/22 1646

## 2022-10-17 NOTE — ED Notes (Signed)
Patient transported to CT 

## 2022-10-17 NOTE — ED Triage Notes (Signed)
Pt reports red blood in stool but he is not sure for how long.

## 2022-10-17 NOTE — ED Notes (Addendum)
Pts core temp 95.6. MD aware. Pt placed on bair hugger

## 2022-10-17 NOTE — H&P (Signed)
History and Physical    Patient: Todd [name] ZOX:[id number] DOB: 1951-01-06 DOA: 10/17/2022 DOS: the patient was seen and examined on 10/17/2022 PCP: Elfredia Nevins, MD  Patient coming from: Home  Chief Complaint:  Chief Complaint  Patient presents with   Blood In Stools   HPI: Todd [name] is a 72 y.o. male with medical history significant of hypertension, hyperlipidemia, alcohol abuse who presents to the emergency department due to frequent black stools which started since last night.  This continued this morning with 5 episodes of dark red blood in the commode, he now complains of worsening generalized weakness with difficulty in being able to ambulate at this time.  He complained of feeling cold, but denies chest pain, shortness of breath, nausea or vomiting.  ED Course:  In the emergency department, temperature on arrival was 96.2 F, respiratory rate 21/min, BP 102/59, pulse 79 bpm, O2 sat was 100% on room air.  Workup in the ED showed hemoglobin 10.0, hematocrit 30, magnesium 1.7, lipase 20.  Influenza A, B, SARS coronavirus 2, RSV was negative, blood culture pending. CT abdomen and pelvis without and with contrast: 1. Diffuse atherosclerotic disease throughout the abdomen and pelvis. Aortic Atherosclerosis  2. 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 Occlusion or high-grade stenosis at the origin of the IMA. These vascular findings could be associated with chronic mesenteric ischemia in the appropriate clinical setting. 3. Severe bilateral renal artery stenosis. 4. Patent bilateral iliac artery stents. 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 5. Proximal femoral arteries are patent. Question a small intimal flap or dissection involving the proximal left common femoral artery. Patient was initially treated with IV ceftriaxone due to suspicion for intra-abdominal infection, IV hydration was provided, Protonix was  given. Gastroenterologist (Dr. Marletta Lor) was consulted and recommended admitting patient with plan to consult on patient in the morning.  Hospitalist was asked to admit patient for further evaluation and management.  Review of Systems: Review of systems as noted in the HPI. All other systems reviewed and are negative.   Past Medical History:  Diagnosis Date   Alcoholism    History of withdrawal and seizures   Atrial fibrillation    Taken off of Coumadin in 2009 in the setting of GI bleed   Chronic back pain    Colitis    4/11   Essential hypertension    Gout    Heart murmur    History of GI bleed    Internal hemorrhoids    Colonoscopy 11/09   Osteoarthritis    Peripheral vascular disease    Schatzki's ring    EGD 11/09   Past Surgical History:  Procedure Laterality Date   ABDOMINAL AORTOGRAM N/A 03/08/2019   Procedure: ABDOMINAL AORTOGRAM;  Surgeon: Sherren Kerns, MD;  Location[facility] INVASIVE CV LAB;  Service: Cardiovascular;  Laterality: N/A;   ABDOMINAL AORTOGRAM W/LOWER EXTREMITY N/A 03/02/2021   Procedure: ABDOMINAL AORTOGRAM W/LOWER EXTREMITY;  Surgeon: Nada Libman, MD;  Location: [facility] INVASIVE CV LAB;  Service: Cardiovascular;  Laterality: N/A;   APPLICATION OF WOUND VAC Right 07/22/2021   Procedure: APPLICATION OF WOUND VAC;  Surgeon: Nada Libman, MD;  Location: [facility] OR;  Service: Vascular;  Laterality: Right;   ENDARTERECTOMY Left 02/02/2022   Procedure: LEFT ENDARTERECTOMY CAROTID;  Surgeon: Nada Libman, MD;  Location: [facility] OR;  Service: Vascular;  Laterality: Left;   Excision of gouty lesion     Left index finger   FEMORAL-POPLITEAL BYPASS  GRAFT Right 03/12/2021   Procedure: RIGHT FEMORAL TO POPLITEAL ARTERY BYPASS GRAFTING USING THE PROPATEN GRAFT;  Surgeon: Nada Libman, MD;  Location: [facility] OR;  Service: Vascular;  Laterality: Right;   GROIN DEBRIDEMENT Right 07/22/2021   Procedure: RIGHT GROIN DEBRIDEMENT;  Surgeon: Nada Libman, MD;  Location[facility] OR;   Service: Vascular;  Laterality: Right;   INSERTION OF ILIAC STENT Right 03/12/2021   Procedure: INSERTION OF  EXTERNAL ILIAC STENT;  Surgeon: Nada Libman, MD;  Location: [facility] OR;  Service: Vascular;  Laterality: Right;   LOWER EXTREMITY ANGIOGRAM Right 03/12/2021   Procedure: LOWER EXTREMITY ANGIOGRAM;  Surgeon: Nada Libman, MD;  Location: [facility] OR;  Service: Vascular;  Laterality: Right;   LOWER EXTREMITY ANGIOGRAPHY Bilateral 03/08/2019   Procedure: Lower Extremity Angiography;  Surgeon: Sherren Kerns, MD;  Location: [facility] INVASIVE CV LAB;  Service: Cardiovascular;  Laterality: Bilateral;   LOWER EXTREMITY ANGIOGRAPHY N/A 03/15/2021   Procedure: LOWER EXTREMITY ANGIOGRAPHY;  Surgeon: Maeola Harman, MD;  Location: [facility] INVASIVE CV LAB;  Service: Cardiovascular;  Laterality: N/A;   MASS EXCISION Right 07/07/2020   Procedure: EXCISION OF RIGHT FACIAL MASS;  Surgeon: Newman Pies, MD;  Location: [facility];  Service: ENT;  Laterality: Right;   PATCH ANGIOPLASTY Left 02/02/2022   Procedure: PATCH ANGIOPLASTY OF LEFT CAROTID USING XENOSURE BOVINE PERICARDIUM PATCH;  Surgeon: Nada Libman, MD;  Location: [facility] OR;  Service: Vascular;  Laterality: Left;   PERIPHERAL VASCULAR BALLOON ANGIOPLASTY Right 03/02/2021   Procedure: PERIPHERAL VASCULAR BALLOON ANGIOPLASTY;  Surgeon: Nada Libman, MD;  Location: [facility] INVASIVE CV LAB;  Service: Cardiovascular;  Laterality: Right;  external iliac   PERIPHERAL VASCULAR INTERVENTION Bilateral 03/08/2019   Procedure: PERIPHERAL VASCULAR INTERVENTION;  Surgeon: Sherren Kerns, MD;  Location: [facility] INVASIVE CV LAB;  Service: Cardiovascular;  Laterality: Bilateral;  ext iliac artery stent   PERIPHERAL VASCULAR INTERVENTION Left 03/02/2021   Procedure: PERIPHERAL VASCULAR INTERVENTION;  Surgeon: Nada Libman, MD;  Location: [facility] INVASIVE CV LAB;  Service: Cardiovascular;  Laterality: Left;  external iliac   PERIPHERAL VASCULAR INTERVENTION Right  03/15/2021   Procedure: PERIPHERAL VASCULAR INTERVENTION;  Surgeon: Maeola Harman, MD;  Location: [facility] INVASIVE CV LAB;  Service: Cardiovascular;  Laterality: Right;  external iliac   Right knee arthroscopy     SPINE SURGERY      Social History:  reports that he has been smoking cigarettes. He has been smoking an average of .5 packs per day. He has been exposed to tobacco smoke. He has never used smokeless tobacco. He reports current alcohol use of about 5.0 standard drinks of alcohol per week. He reports that he does not use drugs.   No Known Allergies  Family History  Problem Relation Age of Onset   Coronary artery disease Father    Coronary artery disease Sister        MI at age 70     Prior to Admission medications   Medication Sig Start Date End Date Taking? Authorizing Provider  amLODipine (NORVASC) 5 MG tablet Take 0.5 tablets (2.5 mg total) by mouth daily. 09/12/22 09/07/23 Yes Jonelle Sidle, MD  aspirin EC 81 MG EC tablet Take 1 tablet (81 mg total) by mouth daily at 6 (six) AM. Swallow whole. 03/17/21  Yes Setzer, Lynnell Jude, PA-C  clopidogrel (PLAVIX) 75 MG tablet TAKE ONE TABLET BY MOUTH ONCE DAILY. 01/06/22  Yes Jonelle Sidle, MD  rosuvastatin (CRESTOR) 10 MG tablet TAKE (  1) TABLET BY MOUTH ONCE DAILY. 09/09/20  Yes Maeola Harman, MD    Physical Exam: BP 117/69   Pulse 94   Temp 98.8 F (37.1 C) (Oral)   Resp 18   Ht  (1.727 m)   Wt 54.3 kg   SpO2 98%   BMI 18.20 kg/m   General: 72 y.o. year-old male well developed well nourished in no acute distress.  Alert and oriented x3. HEENT: NCAT, EOMI Neck: Supple, trachea medial Cardiovascular: Irregularly irregular rate and rhythm with no rubs or gallops.  No thyromegaly or JVD noted.  No lower extremity edema. 2/4 pulses in all 4 extremities. Respiratory: Clear to auscultation with no wheezes or rales. Good inspiratory effort. Abdomen: Soft, nontender nondistended with normal bowel sounds  x4 quadrants. Muskuloskeletal: No cyanosis, clubbing or edema noted bilaterally Neuro: CN II-XII intact, strength 5/5 x 4, sensation, reflexes intact Skin: No ulcerative lesions noted or rashes Psychiatry: Judgement and insight appear normal. Mood is appropriate for condition and setting          Labs on Admission:  Basic Metabolic Panel: Recent Labs  Lab 10/17/22 1202  NA 139  K 4.1  CL 106  CO2 22  GLUCOSE 156*  BUN 22  CREATININE 1.01  CALCIUM 7.8*  MG 1.7   Liver Function Tests: Recent Labs  Lab 10/17/22 1202  AST 20  ALT 10  ALKPHOS 65  BILITOT 0.5  PROT 5.8*  ALBUMIN 2.5*   Recent Labs  Lab 10/17/22 1202  LIPASE 20   No results for input(s): "AMMONIA" in the last 168 hours. CBC: Recent Labs  Lab 10/17/22 1202  WBC 15.2*  NEUTROABS 12.7*  HGB 10.0*  HCT 31.2*  MCV 97.2  PLT 215   Cardiac Enzymes: No results for input(s): "CKTOTAL", "CKMB", "CKMBINDEX", "TROPONINI" in the last 168 hours.  BNP (last 3 results) No results for input(s): "BNP" in the last 8760 hours.  ProBNP (last 3 results) No results for input(s): "PROBNP" in the last 8760 hours.  CBG: No results for input(s): "GLUCAP" in the last 168 hours.  Radiological Exams on Admission: CT Angio Abd/Pel W and/or Wo Contrast  Result Date: 10/17/2022 CLINICAL DATA:  Blood in stool.  Low body temperature. EXAM: CTA ABDOMEN AND PELVIS WITHOUT AND WITH CONTRAST TECHNIQUE: Multidetector CT imaging of the abdomen and pelvis was performed using the standard protocol during bolus administration of intravenous contrast. Multiplanar reconstructed images and MIPs were obtained and reviewed to evaluate the vascular anatomy. RADIATION DOSE REDUCTION: This exam was performed according to the departmental dose-optimization program which includes automated exposure control, adjustment of the mA and/or kV according to patient size and/or use of iterative reconstruction technique. CONTRAST:  75mL OMNIPAQUE  IOHEXOL 350 MG/ML SOLN COMPARISON:  CT abdomen and pelvis 10/08/2009 FINDINGS: VASCULAR Aorta: Atherosclerotic disease in the abdominal aorta without aneurysm, dissection or significant stenosis. Celiac: Calcified plaque at the origin of the celiac trunk with a moderate or severe stenosis. Main branch vessels of the celiac trunk are patent. Limited evaluation of the pancreatic-duodenal branches due to the extensive pancreatic calcifications. No clear evidence for aneurysm formation. SMA: At least moderate stenosis at the origin of the SMA related to aortic plaque at the aorta. Main SMA branches are patent. Renals: High-grade stenosis in the proximal right renal artery within near occlusion. Severe high-grade stenosis in the proximal/mid left renal artery. No evidence for renal artery aneurysm. IMA: IMA is patent but there is either occlusion or high-grade stenosis at  the origin. Inflow: Extensive plaque involving the bilateral iliac arteries. Mild narrowing in the proximal right common iliac artery. Widely patent stent involving the distal right common iliac artery extending into the distal right external iliac artery. Origin of the right internal iliac artery is occluded. Approximately 50% stenosis in the proximal left common iliac artery. Origin of the left internal iliac artery is occluded. Widely patent stent in the proximal left external iliac artery. Proximal Outflow: Proximal right femoral arteries are widely patent. Question a small intimal flap or dissection flap in the proximal left common femoral artery. Proximal left femoral arteries are patent. Veins: Venous structures are unremarkable. Portal venous system is patent. Review of the MIP images confirms the above findings. NON-VASCULAR Lower chest: 3 mm nodule in the left lung base image 31/6 is probably calcified. Evidence for chronic changes at the lung bases. Hepatobiliary: Probable small calcified gallstones. No evidence for gallbladder inflammatory  changes. Mild intrahepatic biliary dilatation. Pancreas: Extensive calcifications throughout the pancreas, particularly the pancreatic head region. Main pancreatic duct appears to be diffusely enlarged. Focal low-density in the pancreatic head measures up to 1.2 cm and this could represent a pseudocyst formation but nonspecific. Spleen: Normal in size without focal abnormality. Adrenals/Urinary Tract: Normal adrenal glands. Negative for kidney stones or hydronephrosis. Normal urinary bladder. Stomach/Bowel: No evidence for acute GI bleeding. Again noted is abnormal configuration of the bowel with small bowel loops predominantly on the right side of the abdomen and the cecum is left of midline. 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 There is no evidence for bowel dilatation or focal inflammation. Lymphatic: No lymph node enlargement in the abdomen or pelvis. Reproductive: Prostate is unremarkable. Other: Diffuse stranding throughout the perinephric space and increased since 2011. There is no free air free fluid. Stranding in the perirectal fat. Mild subcutaneous edema. Musculoskeletal: Severe disc space loss at L5-S1 with retrolisthesis of L5 on S1. There is vertebral body height loss at L3 and L4 likely representing old compression fractures. Disc space narrowing at L1-L2. IMPRESSION: VASCULAR 1. Diffuse atherosclerotic disease throughout the abdomen and pelvis. Aortic Atherosclerosis (ICD10-I70.0). 2. 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 Occlusion or high-grade stenosis at the origin of the IMA. These vascular findings could be associated with chronic mesenteric ischemia in the appropriate clinical setting. 3. Severe bilateral renal artery stenosis. 4. Patent bilateral iliac artery stents. 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 5. Proximal femoral arteries are patent. Question a small intimal flap or dissection involving the  proximal left common femoral artery. NON-VASCULAR 1. No evidence for acute GI bleeding. 2. Intestinal malrotation. Findings are similar to the exam in 2011. No evidence for acute bowel inflammation or obstruction. 3. Diffuse calcifications throughout the pancreas compatible with prior pancreatitis. There is diffuse dilatation of the main pancreatic duct and cannot exclude a focal cystic structure in the pancreatic head measuring roughly 1.2 cm. Findings could be better characterized with MRI. 4. Nonspecific intra-abdominal and subcutaneous edema. 5. Probable cholelithiasis. 6. 3 mm nodule at the left lung base that may be calcified. No follow-up needed if patient is low-risk. Non-contrast chest CT can be considered in 12 months if patient is high-risk. This recommendation follows the consensus statement: Guidelines for Management of Incidental Pulmonary Nodules Detected on CT Images: From the Fleischner Society 2017; Radiology 2017; 284:228-243. Electronically Signed   By: Richarda Overlie M.D.   On: 10/17/2022 17:00   DG Chest [facility]  Result Date: 10/17/2022 CLINICAL DATA:  Blood in stool, questionable sepsis EXAM: PORTABLE CHEST 1 VIEW COMPARISON:  10/13/2009 FINDINGS: Cardiac and mediastinal contours are within normal limits given AP technique. No focal pulmonary opacity. No pleural effusion or pneumothorax. No acute osseous abnormality. IMPRESSION: No acute cardiopulmonary process. Electronically Signed   By: Wiliam Ke M.D.   On: 10/17/2022 12:55    EKG: I independently viewed the EKG done and my findings are as followed: Atrial fibrillation with rate controlled  Assessment/Plan Present on Admission:  GI bleed  Essential hypertension  Principal Problem:   GI bleed Active Problems:   Essential hypertension   Generalized weakness   Hypoalbuminemia due to protein-calorie malnutrition   Leukocytosis   Lactic acidosis   Mixed hyperlipidemia  GI bleed H/H= 10.0/31.2, this was 10.7/32.1 on  02/03/2022 Hemoccult was positive IV ceftriaxone was given, IV hydration was provided. Continue IV Protonix 40 mg every 12 hours Place patient n.p.o. at midnight Gastroenterologist  was consulted and will follow-up with patient in the morning   Generalized weakness This is possibly secondary to above Continue fall precaution Protein supplement will be provided  Lactic acidosis-resolved  Leukocytosis possibly reactive WBC 15.2, continue to monitor WBC with morning labs  Hypoalbuminemia possibly secondary to moderate protein calorie malnutrition Albumin 2.5, protein supplement will be provided  Essential hypertension Continue amlodipine  Mixed hyperlipidemia Continue Crestor  DVT prophylaxis: SCDs  Advance Care Planning: Full code  Consults: Gastroenterology  Family Communication: None at bedside  Severity of Illness: The appropriate patient status for this patient is OBSERVATION. Observation status is judged to be reasonable and necessary in order to provide the required intensity of service to ensure the patient's safety. The patient's presenting symptoms, physical exam findings, and initial radiographic and laboratory data in the context of their medical condition is felt to place them at decreased risk for further clinical deterioration. Furthermore, it is anticipated that the patient will be medically stable for discharge from the hospital within 2 midnights of admission.   Author: Frankey Shown, DO 10/17/2022 11:20 PM  For on call review [email or web address].

## 2022-10-18 DIAGNOSIS — K921 Melena: Secondary | ICD-10-CM | POA: Diagnosis not present

## 2022-10-18 DIAGNOSIS — R935 Abnormal findings on diagnostic imaging of other abdominal regions, including retroperitoneum: Secondary | ICD-10-CM | POA: Diagnosis not present

## 2022-10-18 DIAGNOSIS — I959 Hypotension, unspecified: Secondary | ICD-10-CM | POA: Diagnosis not present

## 2022-10-18 LAB — MAGNESIUM: Magnesium: 1.5 mg/dL — ABNORMAL LOW (ref 1.7–2.4)

## 2022-10-18 LAB — COMPREHENSIVE METABOLIC PANEL
ALT: 6 U/L (ref 0–44)
AST: 16 U/L (ref 15–41)
Albumin: 2.1 g/dL — ABNORMAL LOW (ref 3.5–5.0)
Alkaline Phosphatase: 52 U/L (ref 38–126)
Anion gap: 6 (ref 5–15)
BUN: 16 mg/dL (ref 8–23)
CO2: 22 mmol/L (ref 22–32)
Calcium: 7.2 mg/dL — ABNORMAL LOW (ref 8.9–10.3)
Chloride: 109 mmol/L (ref 98–111)
Creatinine, Ser: 0.87 mg/dL (ref 0.61–1.24)
GFR, Estimated: >60 mL/min (ref >60)
Glucose, Bld: 82 mg/dL (ref 70–99)
Potassium: 3.4 mmol/L — ABNORMAL LOW (ref 3.5–5.1)
Sodium: 137 mmol/L (ref 135–145)
Total Bilirubin: 0.4 mg/dL (ref 0.3–1.2)
Total Protein: 4.7 g/dL — ABNORMAL LOW (ref 6.5–8.1)

## 2022-10-18 LAB — CBC
HCT: 27.6 % — ABNORMAL LOW (ref 39.0–52.0)
Hemoglobin: 9.1 g/dL — ABNORMAL LOW (ref 13.0–17.0)
MCH: 30.8 pg (ref 26.0–34.0)
MCHC: 33 g/dL (ref 30.0–36.0)
MCV: 93.6 fL (ref 80.0–100.0)
Platelets: 148 10*3/uL — ABNORMAL LOW (ref 150–400)
RBC: 2.95 MIL/uL — ABNORMAL LOW (ref 4.22–5.81)
RDW: 14.8 % (ref 11.5–15.5)
WBC: 9 10*3/uL (ref 4.0–10.5)
nRBC: 0 % (ref 0.0–0.2)

## 2022-10-18 LAB — TYPE AND SCREEN
ABO/RH(D): O NEG
Antibody Screen: NEGATIVE
Unit division: 0

## 2022-10-18 LAB — URINALYSIS, ROUTINE W REFLEX MICROSCOPIC
Bilirubin Urine: NEGATIVE
Glucose, UA: NEGATIVE mg/dL
Hgb urine dipstick: NEGATIVE
Ketones, ur: NEGATIVE mg/dL
Leukocytes,Ua: NEGATIVE
Nitrite: NEGATIVE
Protein, ur: NEGATIVE mg/dL
Specific Gravity, Urine: 1.024 (ref 1.005–1.030)
pH: 5 (ref 5.0–8.0)

## 2022-10-18 LAB — PHOSPHORUS: Phosphorus: 2.2 mg/dL — ABNORMAL LOW (ref 2.5–4.6)

## 2022-10-18 LAB — HEMOGLOBIN AND HEMATOCRIT, BLOOD
HCT: 30.6 % — ABNORMAL LOW (ref 39.0–52.0)
Hemoglobin: 9.9 g/dL — ABNORMAL LOW (ref 13.0–17.0)

## 2022-10-18 LAB — BPAM RBC: Unit Type and Rh: 9500

## 2022-10-18 LAB — CULTURE, BLOOD (ROUTINE X 2): Culture: NO GROWTH

## 2022-10-18 MED ORDER — ORAL CARE MOUTH RINSE: 15.0000 mL | OROMUCOSAL | Status: DC | PRN

## 2022-10-18 MED ORDER — PEG 3350-KCL-NA BICARB-NACL 420 G PO SOLR
4000.0000 mL | Freq: Once | ORAL | Status: AC
  Administered 2022-10-18: 4000 mL via ORAL

## 2022-10-18 MED ORDER — LACTATED RINGERS IV BOLUS
500.0000 mL | Freq: Once | INTRAVENOUS | Status: AC
  Administered 2022-10-18: 500 mL via INTRAVENOUS

## 2022-10-18 MED ORDER — MIDODRINE HCL 5 MG PO TABS
2.5000 mg | ORAL_TABLET | Freq: Three times a day (TID) | ORAL | Status: AC
  Administered 2022-10-18 – 2022-10-19 (×2): 2.5 mg via ORAL
  Filled 2022-10-18 (×3): qty 1

## 2022-10-18 MED ORDER — LACTATED RINGERS IV SOLN: INTRAVENOUS | Status: DC

## 2022-10-18 MED ORDER — POTASSIUM CHLORIDE CRYS ER 20 MEQ PO TBCR
40.0000 meq | EXTENDED_RELEASE_TABLET | Freq: Once | ORAL | Status: AC
  Administered 2022-10-18: 40 meq via ORAL
  Filled 2022-10-18: qty 2

## 2022-10-18 MED ORDER — MAGNESIUM SULFATE 2 GM/50ML IV SOLN
2.0000 g | Freq: Once | INTRAVENOUS | Status: AC
  Administered 2022-10-18: 2 g via INTRAVENOUS
  Filled 2022-10-18: qty 50

## 2022-10-18 MED ORDER — SODIUM CHLORIDE 0.9 % IV SOLN: INTRAVENOUS | Status: DC

## 2022-10-18 NOTE — Progress Notes (Signed)
  Transition of Care [facility]) Screening Note   Patient Details  Name: Todd [name] Date of Birth: [birth year]   Transition of Care [facility]) CM/SW Contact:    Villa Herb, LCSWA Phone Number: 10/18/2022, 2:15 PM    Transition of Care Department [facility]) has reviewed patient and no TOC needs have been identified at this time. We will continue to monitor patient advancement through interdisciplinary progression rounds. If new patient transition needs arise, please place a TOC consult.

## 2022-10-18 NOTE — Progress Notes (Signed)
PROGRESS NOTE    Todd [name]  ONG:[id number] DOB: [birth year] DOA: 10/17/2022 PCP: Elfredia Nevins, MD    Brief Narrative:  Todd [name] is a 72 y.o. male with medical history significant of hypertension, hyperlipidemia, alcohol abuse who presents to the emergency department due to frequent black stools which started since last night.  This continued this morning with 5 episodes of dark red blood in the commode, he now complains of worsening generalized weakness with difficulty in being able to ambulate at this time.  He complained of feeling cold, but denies chest pain, shortness of breath, nausea or vomiting.  Plan is for EGD/colonoscopy in AM if BP Stable   Assessment and Plan: GI bleed Hemoccult was positive Continue IV Protonix 40 mg every 12 hours GI consulted-- plan for EGD/colonoscopy in AM if BP stable   Hypotension -appears that norvasc was reordered-- have stopped unfortunately got a dose prior to me seeing him -IVF -can do a trial of midodrine -baseline SBP in the 110s  Generalized weakness This is possibly secondary to above Continue fall precaution -ambulate    Hypokalemia/hypomagnesemia -replete  Lactic acidosis-resolved   Leukocytosis possibly reactive -resolved   Hypoalbuminemia possibly secondary to moderate protein calorie malnutrition Albumin 2.5, protein supplement will be provided   Essential hypertension -see hypotension above   Mixed hyperlipidemia Continue Crestor   DVT prophylaxis: SCDs Start: 10/17/22 2257    Code Status: Full Code Family Communication: at bedside  Disposition Plan:  Level of care: Med-Surg Status is: Inpatient Remains inpatient appropriate because: needs EGD/colon    Consultants:  GI   Subjective: No current complaints  Objective: Vitals:   10/18/22 0228 10/18/22 0552 10/18/22 1050 10/18/22 1050  BP: 106/63 96/65 106/67 106/67  Pulse: 87 82  83  Resp: 18 18    Temp: 98.6 F (37 C) 98.2 F  (36.8 C)  98.6 F (37 C)  TempSrc: Oral Oral  Oral  SpO2: 98% 99%  100%  Weight:      Height:        Intake/Output Summary (Last 24 hours) at 10/18/2022 1213 Last data filed at 10/18/2022 1000 Gross per 24 hour  Intake 3472.19 ml  Output 550 ml  Net 2922.19 ml   Filed Weights   10/17/22 1141 10/17/22 2213  Weight: 56.1 kg 54.3 kg    Examination:   General: Appearance:    Thin male in no acute distress     Lungs:     respirations unlabored  Heart:    Normal heart rate.    MS:   All extremities are intact.    Neurologic:   Awake, alert, oriented x 3. No apparent focal neurological           defect.        Data Reviewed: I have personally reviewed following labs and imaging studies  CBC: Recent Labs  Lab 10/17/22 1202 10/18/22 0420  WBC 15.2* 9.0  NEUTROABS 12.7*  --   HGB 10.0* 9.1*  HCT 31.2* 27.6*  MCV 97.2 93.6  PLT 215 148*   Basic Metabolic Panel: Recent Labs  Lab 10/17/22 1202 10/18/22 0420  NA 139 137  K 4.1 3.4*  CL 106 109  CO2 22 22  GLUCOSE 156* 82  BUN 22 16  CREATININE 1.01 0.87  CALCIUM 7.8* 7.2*  MG 1.7 1.5*  PHOS  --  2.2*   GFR: Estimated Creatinine Clearance: 58.9 mL/min (by C-G formula based on SCr of 0.87 mg/dL). Liver Function Tests:  Recent Labs  Lab 10/17/22 1202 10/18/22 0420  AST 20 16  ALT 10 6  ALKPHOS 65 52  BILITOT 0.5 0.4  PROT 5.8* 4.7*  ALBUMIN 2.5* 2.1*   Recent Labs  Lab 10/17/22 1202  LIPASE 20   No results for input(s): "AMMONIA" in the last 168 hours. Coagulation Profile: Recent Labs  Lab 10/17/22 1306  INR 1.2   Cardiac Enzymes: No results for input(s): "CKTOTAL", "CKMB", "CKMBINDEX", "TROPONINI" in the last 168 hours. BNP (last 3 results) No results for input(s): "PROBNP" in the last 8760 hours. HbA1C: No results for input(s): "HGBA1C" in the last 72 hours. CBG: No results for input(s): "GLUCAP" in the last 168 hours. Lipid Profile: No results for input(s): "CHOL", "HDL", "LDLCALC",  "TRIG", "CHOLHDL", "LDLDIRECT" in the last 72 hours. Thyroid Function Tests: No results for input(s): "TSH", "T4TOTAL", "FREET4", "T3FREE", "THYROIDAB" in the last 72 hours. Anemia Panel: No results for input(s): "VITAMINB12", "FOLATE", "FERRITIN", "TIBC", "IRON", "RETICCTPCT" in the last 72 hours. Sepsis Labs: Recent Labs  Lab 10/17/22 1306 10/17/22 1504  LATICACIDVEN 2.9* 1.5    Recent Results (from the past 240 hour(s))  Blood Culture (routine x 2)     Status: None (Preliminary result)   Collection Time: 10/17/22 12:56 PM   Specimen: BLOOD RIGHT FOREARM  Result Value Ref Range Status   Specimen Description   Final    BLOOD RIGHT FOREARM BOTTLES DRAWN AEROBIC AND ANAEROBIC   Special Requests Blood Culture adequate volume  Final   Culture   Final    NO GROWTH < 24 HOURS Performed at [facility], [address] [place], [place] [address]    Report Status PENDING  Incomplete  Blood Culture (routine x 2)     Status: None (Preliminary result)   Collection Time: 10/17/22  1:06 PM   Specimen: Right Antecubital; Blood  Result Value Ref Range Status   Specimen Description   Final    RIGHT ANTECUBITAL BOTTLES DRAWN AEROBIC AND ANAEROBIC   Special Requests   Final    Blood Culture results may not be optimal due to an excessive volume of blood received in culture bottles   Culture   Final    NO GROWTH < 24 HOURS Performed at [facility], [address], [place], [place] [address]    Report Status PENDING  Incomplete  Resp panel by RT-PCR (RSV, Flu A&B, Covid) Anterior Nasal Swab     Status: None   Collection Time: 10/17/22  5:54 PM   Specimen: Anterior Nasal Swab  Result Value Ref Range Status   SARS Coronavirus 2 by RT PCR NEGATIVE NEGATIVE Final    Comment: (NOTE) SARS-CoV-2 target nucleic acids are NOT DETECTED.  The SARS-CoV-2 RNA is generally detectable in upper respiratory specimens during the acute phase of infection. The lowest concentration of SARS-CoV-2 viral copies  this assay can detect is 138 copies/mL. A negative result does not preclude SARS-Cov-2 infection and should not be used as the sole basis for treatment or other patient management decisions. A negative result may occur with  improper specimen collection/handling, submission of specimen other than nasopharyngeal swab, presence of viral mutation(s) within the areas targeted by this assay, and inadequate number of viral copies(<138 copies/mL). A negative result must be combined with clinical observations, patient history, and epidemiological information. The expected result is Negative.  Fact Sheet for Patients:  BloggerCourse.com  Fact Sheet for Healthcare Providers:  SeriousBroker.it  This test is no t yet approved or cleared by the [place]  [place] FDA and  has been authorized for detection and/or diagnosis of SARS-CoV-2 by FDA under an Emergency Use Authorization (EUA). This EUA will remain  in effect (meaning this test can be used) for the duration of the COVID-19 declaration under Section 564(b)(1) of the Act, 21 U.S.C.section 360bbb-3(b)(1), unless the authorization is terminated  or revoked sooner.       Influenza A by PCR NEGATIVE NEGATIVE Final   Influenza B by PCR NEGATIVE NEGATIVE Final    Comment: (NOTE) The Xpert Xpress SARS-CoV-2/FLU/RSV plus assay is intended as an aid in the diagnosis of influenza from Nasopharyngeal swab specimens and should not be used as a sole basis for treatment. Nasal washings and aspirates are unacceptable for Xpert Xpress SARS-CoV-2/FLU/RSV testing.  Fact Sheet for Patients: BloggerCourse.com  Fact Sheet for Healthcare Providers: SeriousBroker.it  This test is not yet approved or cleared by the [place] FDA and has been authorized for detection and/or diagnosis of SARS-CoV-2 by FDA under an Emergency Use Authorization (EUA). This EUA  will remain in effect (meaning this test can be used) for the duration of the COVID-19 declaration under Section 564(b)(1) of the Act, 21 U.S.C. section 360bbb-3(b)(1), unless the authorization is terminated or revoked.     Resp Syncytial Virus by PCR NEGATIVE NEGATIVE Final    Comment: (NOTE) Fact Sheet for Patients: BloggerCourse.com  Fact Sheet for Healthcare Providers: SeriousBroker.it  This test is not yet approved or cleared by the [place] FDA and has been authorized for detection and/or diagnosis of SARS-CoV-2 by FDA under an Emergency Use Authorization (EUA). This EUA will remain in effect (meaning this test can be used) for the duration of the COVID-19 declaration under Section 564(b)(1) of the Act, 21 U.S.C. section 360bbb-3(b)(1), unless the authorization is terminated or revoked.  Performed at [facility], [address], [place], [place] [address]          Radiology Studies: CT Angio Abd/Pel W and/or Wo Contrast  Result Date: 10/17/2022 CLINICAL DATA:  Blood in stool.  Low body temperature. EXAM: CTA ABDOMEN AND PELVIS WITHOUT AND WITH CONTRAST TECHNIQUE: Multidetector CT imaging of the abdomen and pelvis was performed using the standard protocol during bolus administration of intravenous contrast. Multiplanar reconstructed images and MIPs were obtained and reviewed to evaluate the vascular anatomy. RADIATION DOSE REDUCTION: This exam was performed according to the departmental dose-optimization program which includes automated exposure control, adjustment of the mA and/or kV according to patient size and/or use of iterative reconstruction technique. CONTRAST:  75mL OMNIPAQUE IOHEXOL 350 MG/ML SOLN COMPARISON:  CT abdomen and pelvis 10/08/2009 FINDINGS: VASCULAR Aorta: Atherosclerotic disease in the abdominal aorta without aneurysm, dissection or significant stenosis. Celiac: Calcified plaque at the origin of the  celiac trunk with a moderate or severe stenosis. Main branch vessels of the celiac trunk are patent. Limited evaluation of the pancreatic-duodenal branches due to the extensive pancreatic calcifications. No clear evidence for aneurysm formation. SMA: At least moderate stenosis at the origin of the SMA related to aortic plaque at the aorta. Main SMA branches are patent. Renals: High-grade stenosis in the proximal right renal artery within near occlusion. Severe high-grade stenosis in the proximal/mid left renal artery. No evidence for renal artery aneurysm. IMA: IMA is patent but there is either occlusion or high-grade stenosis at the origin. Inflow: Extensive plaque involving the bilateral iliac arteries. Mild narrowing in the proximal right common iliac artery. Widely patent stent involving the distal right common iliac artery extending into the distal right  external iliac artery. Origin of the right internal iliac artery is occluded. Approximately 50% stenosis in the proximal left common iliac artery. Origin of the left internal iliac artery is occluded. Widely patent stent in the proximal left external iliac artery. Proximal Outflow: Proximal right femoral arteries are widely patent. Question a small intimal flap or dissection flap in the proximal left common femoral artery. Proximal left femoral arteries are patent. Veins: Venous structures are unremarkable. Portal venous system is patent. Review of the MIP images confirms the above findings. NON-VASCULAR Lower chest: 3 mm nodule in the left lung base image 31/6 is probably calcified. Evidence for chronic changes at the lung bases. Hepatobiliary: Probable small calcified gallstones. No evidence for gallbladder inflammatory changes. Mild intrahepatic biliary dilatation. Pancreas: Extensive calcifications throughout the pancreas, particularly the pancreatic head region. Main pancreatic duct appears to be diffusely enlarged. Focal low-density in the pancreatic head  measures up to 1.2 cm and this could represent a pseudocyst formation but nonspecific. Spleen: Normal in size without focal abnormality. Adrenals/Urinary Tract: Normal adrenal glands. Negative for kidney stones or hydronephrosis. Normal urinary bladder. Stomach/Bowel: No evidence for acute GI bleeding. Again noted is abnormal configuration of the bowel with small bowel loops predominantly on the right side of the abdomen and the cecum is left of midline. 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 There is no evidence for bowel dilatation or focal inflammation. Lymphatic: No lymph node enlargement in the abdomen or pelvis. Reproductive: Prostate is unremarkable. Other: Diffuse stranding throughout the perinephric space and increased since 2011. There is no free air free fluid. Stranding in the perirectal fat. Mild subcutaneous edema. Musculoskeletal: Severe disc space loss at L5-S1 with retrolisthesis of L5 on S1. There is vertebral body height loss at L3 and L4 likely representing old compression fractures. Disc space narrowing at L1-L2. IMPRESSION: VASCULAR 1. Diffuse atherosclerotic disease throughout the abdomen and pelvis. Aortic Atherosclerosis (ICD10-I70.0). 2. 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 Occlusion or high-grade stenosis at the origin of the IMA. These vascular findings could be associated with chronic mesenteric ischemia in the appropriate clinical setting. 3. Severe bilateral renal artery stenosis. 4. Patent bilateral iliac artery stents. 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 5. Proximal femoral arteries are patent. Question a small intimal flap or dissection involving the proximal left common femoral artery. NON-VASCULAR 1. No evidence for acute GI bleeding. 2. Intestinal malrotation. Findings are similar to the exam in 2011. No evidence for acute bowel inflammation or obstruction. 3. Diffuse calcifications  throughout the pancreas compatible with prior pancreatitis. There is diffuse dilatation of the main pancreatic duct and cannot exclude a focal cystic structure in the pancreatic head measuring roughly 1.2 cm. Findings could be better characterized with MRI. 4. Nonspecific intra-abdominal and subcutaneous edema. 5. Probable cholelithiasis. 6. 3 mm nodule at the left lung base that may be calcified. No follow-up needed if patient is low-risk. Non-contrast chest CT can be considered in 12 months if patient is high-risk. This recommendation follows the consensus statement: Guidelines for Management of Incidental Pulmonary Nodules Detected on CT Images: From the Fleischner Society 2017; Radiology 2017; 284:228-243. Electronically Signed   By: Richarda Overlie M.D.   On: 10/17/2022 17:00   DG Chest Port 1 View  Result Date: 10/17/2022 CLINICAL DATA:  Blood in stool, questionable sepsis EXAM: PORTABLE CHEST 1 VIEW COMPARISON:  10/13/2009 FINDINGS: Cardiac and mediastinal contours are within normal limits given AP technique. No focal  pulmonary opacity. No pleural effusion or pneumothorax. No acute osseous abnormality. IMPRESSION: No acute cardiopulmonary process. Electronically Signed   By: Wiliam Ke M.D.   On: 10/17/2022 12:55        Scheduled Meds:  amLODipine  2.5 mg Oral Daily   feeding supplement  237 mL Oral BID BM   pantoprazole  40 mg Intravenous Q12H   rosuvastatin  10 mg Oral Daily   Continuous Infusions:  magnesium sulfate bolus IVPB 2 g (10/18/22 1122)     LOS: 1 day    Time spent: 45 minutes spent on chart review, discussion with nursing staff, consultants, updating family and interview/physical exam; more than 50% of that time was spent in counseling and/or coordination of care.    Joseph Art, DO [facility] Available via Epic secure chat 7am-7pm After these hours, please refer to coverage provider listed on [email or web address] 10/18/2022, 12:13 PM

## 2022-10-18 NOTE — H&P (View-Only) (Signed)
@LOGO @   Referring Provider: Bethann Berkshire, MD Primary Care Physician:  Elfredia Nevins, MD Primary Gastroenterologist:  Dr. Darrick Penna previously.   Date of Admission: 10/17/22 Date of Consultation: 10/18/22  Reason for Consultation:  GI bleed  HPI:  Todd [name] is a 72 y.o. year old male with history of alcoholism, atrial fibrillation, HTN, HLD, gout, PVD, Schatzki's ring, internal hemorrhoids, who presented to the emergency room due to burgundy stools with associated generalized weakness.  ED course: Initial BP 87/50, respiratory rate 28, temp 95.6, heart rate 79, SpO2 100%.  Labs remarkable for hemoglobin 10.0, fecal occult blood positive, lactic acid 2.9, WBC 15.2.  Chest x-ray with no acute process.  CT angio A/P: No evidence for acute GI bleeding.  Intestinal malrotation (chronic).  At least moderate stenosis involving the celiac trunk and SMA, occlusion or high-grade stenosis at the origin of the IMA which could be associated with chronic mesenteric ischemia, severe bilateral renal artery stenosis, occlusion of proximal internal iliac artery bilaterally, patent bilateral iliac artery stents,, question of small intimal flap or dissection involving proximal left common femoral artery.  Diffuse calcifications throughout the pancreas compatible with prior pancreatitis, diffuse dilation of main pancreatic duct with inability to exclude focal cystic structure in the pancreatic head measuring 1.2 cm.  He received 2 L fluid bolus and was started on LR 150 cc/h, IV Protonix twice daily, and ceftriaxone.  He also received 1 unit PRBCs.  Made NPO at midnight.   Today, hemoglobin down to 9.1. Leukocytosis resolved, WBC 9.0. Lactic acidosis resolved.  Consult:  Burgandy stool started Saturday at 4pm. Just on toilet tissue and on the stool. Continued with intermittent symptoms Sunday and Monday and decided he should come to the ER as he began to feel weak. 2 total episodes on Monday. Last  episode around 7pm. No bleeding today. No diarrhea. No abdominal pain. Just knows he needs to have a BM which is his baseline.   Took iron Sunday.    Generally is active. Does all the yard work. Runs a tiller, weed eats, lifts bags of fertilizer. Hasn't paid attention to his weight. Eats when he wants and stops when he is full. No postprandial abdominal pain. No nausea, vomiting, reflux symptoms, or dysphagia.   NSAIDs: BC powder several mornings a week.  Alcohol: Drinks 1-2 beer on Sunday while watching the race.   Denies any history of pancreatitis.  Prior EGDs:  05/17/2008 with patent Schatzki's ring. 07/07/2007 with Candida esophagitis, diffuse erythema in the body and antrum of the stomach without ulceration, occasional erosion, biopsies negative for H. pylori.  Prior colonoscopies:  05/17/2008 with extremely tortuous colon that did not allow for distal TI intubation, otherwise normal exam.  He did have evidence of moderate internal hemorrhoids. 06/02/2006 with normal exam.    Past Medical History:  Diagnosis Date   Alcoholism    History of withdrawal and seizures   Atrial fibrillation    Taken off of Coumadin in 2009 in the setting of GI bleed   Chronic back pain    Colitis    4/11   Essential hypertension    Gout    Heart murmur    History of GI bleed    Internal hemorrhoids    Colonoscopy 11/09   Osteoarthritis    Peripheral vascular disease    Schatzki's ring    EGD 11/09    Past Surgical History:  Procedure Laterality Date   ABDOMINAL AORTOGRAM N/A 03/08/2019   Procedure: ABDOMINAL AORTOGRAM;  Surgeon: Sherren Kerns, MD;  Location: [facility] INVASIVE CV LAB;  Service: Cardiovascular;  Laterality: N/A;   ABDOMINAL AORTOGRAM W/LOWER EXTREMITY N/A 03/02/2021   Procedure: ABDOMINAL AORTOGRAM W/LOWER EXTREMITY;  Surgeon: Nada Libman, MD;  Location: [facility] INVASIVE CV LAB;  Service: Cardiovascular;  Laterality: N/A;   APPLICATION OF WOUND VAC Right 07/22/2021   Procedure:  APPLICATION OF WOUND VAC;  Surgeon: Nada Libman, MD;  Location: [facility] OR;  Service: Vascular;  Laterality: Right;   ENDARTERECTOMY Left 02/02/2022   Procedure: LEFT ENDARTERECTOMY CAROTID;  Surgeon: Nada Libman, MD;  Location: [facility] OR;  Service: Vascular;  Laterality: Left;   Excision of gouty lesion     Left index finger   FEMORAL-POPLITEAL BYPASS GRAFT Right 03/12/2021   Procedure: RIGHT FEMORAL TO POPLITEAL ARTERY BYPASS GRAFTING USING THE PROPATEN GRAFT;  Surgeon: Nada Libman, MD;  Location: [facility] OR;  Service: Vascular;  Laterality: Right;   GROIN DEBRIDEMENT Right 07/22/2021   Procedure: RIGHT GROIN DEBRIDEMENT;  Surgeon: Nada Libman, MD;  Location: [facility] OR;  Service: Vascular;  Laterality: Right;   INSERTION OF ILIAC STENT Right 03/12/2021   Procedure: INSERTION OF  EXTERNAL ILIAC STENT;  Surgeon: Nada Libman, MD;  Location: [facility] OR;  Service: Vascular;  Laterality: Right;   LOWER EXTREMITY ANGIOGRAM Right 03/12/2021   Procedure: LOWER EXTREMITY ANGIOGRAM;  Surgeon: Nada Libman, MD;  Location: [facility] OR;  Service: Vascular;  Laterality: Right;   LOWER EXTREMITY ANGIOGRAPHY Bilateral 03/08/2019   Procedure: Lower Extremity Angiography;  Surgeon: Sherren Kerns, MD;  Location: [facility] INVASIVE CV LAB;  Service: Cardiovascular;  Laterality: Bilateral;   LOWER EXTREMITY ANGIOGRAPHY N/A 03/15/2021   Procedure: LOWER EXTREMITY ANGIOGRAPHY;  Surgeon: Maeola Harman, MD;  Location: [facility] INVASIVE CV LAB;  Service: Cardiovascular;  Laterality: N/A;   MASS EXCISION Right 07/07/2020   Procedure: EXCISION OF RIGHT FACIAL MASS;  Surgeon: Newman Pies, MD;  Location: [facility];  Service: ENT;  Laterality: Right;   PATCH ANGIOPLASTY Left 02/02/2022   Procedure: PATCH ANGIOPLASTY OF LEFT CAROTID USING XENOSURE BOVINE PERICARDIUM PATCH;  Surgeon: Nada Libman, MD;  Location: [facility] OR;  Service: Vascular;  Laterality: Left;   PERIPHERAL VASCULAR BALLOON ANGIOPLASTY Right 03/02/2021    Procedure: PERIPHERAL VASCULAR BALLOON ANGIOPLASTY;  Surgeon: Nada Libman, MD;  Location: [facility] INVASIVE CV LAB;  Service: Cardiovascular;  Laterality: Right;  external iliac   PERIPHERAL VASCULAR INTERVENTION Bilateral 03/08/2019   Procedure: PERIPHERAL VASCULAR INTERVENTION;  Surgeon: Sherren Kerns, MD;  Location: [facility] INVASIVE CV LAB;  Service: Cardiovascular;  Laterality: Bilateral;  ext iliac artery stent   PERIPHERAL VASCULAR INTERVENTION Left 03/02/2021   Procedure: PERIPHERAL VASCULAR INTERVENTION;  Surgeon: Nada Libman, MD;  Location: [facility] INVASIVE CV LAB;  Service: Cardiovascular;  Laterality: Left;  external iliac   PERIPHERAL VASCULAR INTERVENTION Right 03/15/2021   Procedure: PERIPHERAL VASCULAR INTERVENTION;  Surgeon: Maeola Harman, MD;  Location: [facility] INVASIVE CV LAB;  Service: Cardiovascular;  Laterality: Right;  external iliac   Right knee arthroscopy     SPINE SURGERY      Prior to Admission medications   Medication Sig Start Date End Date Taking? Authorizing Provider  amLODipine (NORVASC) 5 MG tablet Take 0.5 tablets (2.5 mg total) by mouth daily. 09/12/22 09/07/23 Yes Jonelle Sidle, MD  aspirin EC 81 MG EC tablet Take 1 tablet (81 mg total) by mouth daily at 6 (six) AM. Swallow whole. 03/17/21  Yes Setzer, Lynnell Jude, PA-C  clopidogrel (PLAVIX) 75 MG tablet TAKE ONE TABLET BY MOUTH ONCE DAILY. 01/06/22  Yes Jonelle Sidle, MD  rosuvastatin (CRESTOR) 10 MG tablet TAKE (1) TABLET BY MOUTH ONCE DAILY. 09/09/20  Yes Maeola Harman, MD    Current Facility-Administered Medications  Medication Dose Route Frequency Provider Last Rate Last Admin   acetaminophen (TYLENOL) tablet 650 mg  650 mg Oral Q6H PRN Adefeso, Oladapo, DO       Or   acetaminophen (TYLENOL) suppository 650 mg  650 mg Rectal Q6H PRN Adefeso, Oladapo, DO       amLODipine (NORVASC) tablet 2.5 mg  2.5 mg Oral Daily Adefeso, Oladapo, DO       feeding supplement (ENSURE ENLIVE / ENSURE PLUS)  liquid 237 mL  237 mL Oral BID BM Adefeso, Oladapo, DO       lactated ringers infusion   Intravenous Continuous Adefeso, Oladapo, DO 150 mL/hr at 10/18/22 0500 New Bag at 10/18/22 0500   ondansetron (ZOFRAN) tablet 4 mg  4 mg Oral Q6H PRN Adefeso, Oladapo, DO       Or   ondansetron (ZOFRAN) injection 4 mg  4 mg Intravenous Q6H PRN Adefeso, Oladapo, DO       Oral care mouth rinse  15 mL Mouth Rinse PRN Adefeso, Oladapo, DO       pantoprazole (PROTONIX) injection 40 mg  40 mg Intravenous Q12H Adefeso, Oladapo, DO   40 mg at 10/17/22 2256   rosuvastatin (CRESTOR) tablet 10 mg  10 mg Oral Daily Adefeso, Oladapo, DO        Allergies as of 10/17/2022   (No Known Allergies)    Family History  Problem Relation Age of Onset   Coronary artery disease Father    Coronary artery disease Sister        MI at age 1    Social History   Socioeconomic History   Marital status: Married    Spouse name: Not on file   Number of children: 3   Years of education: Not on file   Highest education level: Not on file  Occupational History   Occupation: Retired    Associate Professor: [employer]  Tobacco Use   Smoking status: Every Day    Packs/day: .5    Types: Cigarettes    Passive exposure: Current   Smokeless tobacco: Never   Tobacco comments:    0.25-0.5 packs per day  Vaping Use   Vaping Use: Never used  Substance and Sexual Activity   Alcohol use: Yes    Alcohol/week: 5.0 standard drinks of alcohol    Types: 5 Cans of beer per week    Comment: less than a 6 pack of beer per week   Drug use: No   Sexual activity: Never  Other Topics Concern   Not on file  Social History Narrative   Not on file   Social Determinants of Health   Financial Resource Strain: Not on file  Food Insecurity: No Food Insecurity (10/17/2022)   Hunger Vital Sign    Worried About Running Out of Food in the Last Year: Never true    Ran Out of Food in the Last Year: Never true  Transportation Needs: No  Transportation Needs (10/17/2022)   PRAPARE - Administrator, Civil Service (Medical): No    Lack of Transportation (Non-Medical): No  Physical Activity: Not on file  Stress: Not on file  Social Connections: Not on file  Intimate Partner Violence: Not At Risk (10/17/2022)  Humiliation, Afraid, Rape, and Kick questionnaire    Fear of Current or Ex-Partner: No    Emotionally Abused: No    Physically Abused: No    Sexually Abused: No    Review of Systems: Gen: Denies fever, chills, cold or symptoms, presyncope, syncope. CV: Denies chest pain, heart palpitations. Resp: Denies shortness of breath, cough. GI: See HPI GU : Denies urinary burning, urinary frequency, urinary incontinence.  Derm: Denies rash. Psych: Denies depression, anxiety. Heme: See HPI  Physical Exam: Vital signs in last 24 hours: Temp:  [92.2 F (33.4 C)-98.9 F (37.2 C)] 98.2 F (36.8 C) (04/23 0552) Pulse Rate:  [40-134] 82 (04/23 0552) Resp:  [8-33] 18 (04/23 0552) BP: (71-133)/(43-82) 96/65 (04/23 0552) SpO2:  [63 %-100 %] 99 % (04/23 0552) Weight:  [54.3 kg-56.1 kg] 54.3 kg (04/22 2213) Last BM Date : 10/17/22 (per pt report) General:   Alert,  well-developed, thin, elderly gentleman, pleasant and cooperative in NAD Head:  Normocephalic and atraumatic. Eyes:  Sclera clear, no icterus.   Conjunctiva pink. Ears: Decreased auditory acuity. Lungs:  Clear throughout to auscultation.   No wheezes, crackles, or rhonchi. No acute distress. Heart:  Regular rate and rhythm; no murmurs, clicks, rubs,  or gallops. Abdomen:  Soft, nontender and nondistended. No masses, hepatosplenomegaly or hernias noted. Normal bowel sounds, without guarding, and without rebound.   Rectal:  Deferred   Msk:  Symmetrical without gross deformities. Normal posture. Extremities:  Without edema. Neurologic:  Alert and  oriented x4;  grossly normal neurologically. Skin:  Intact without significant lesions or rashes. Psych:   Normal mood and affect.  Intake/Output from previous day: 04/22 0701 - 04/23 0700 In: 3472.2 [I.V.:1000; Blood:380; IV Piggyback:2092.2] Out: 300 [Urine:300] Intake/Output this shift: No intake/output data recorded.  Lab Results: Recent Labs    10/17/22 1202 10/18/22 0420  WBC 15.2* 9.0  HGB 10.0* 9.1*  HCT 31.2* 27.6*  PLT 215 148*   BMET Recent Labs    10/17/22 1202 10/18/22 0420  NA 139 137  K 4.1 3.4*  CL 106 109  CO2 22 22  GLUCOSE 156* 82  BUN 22 16  CREATININE 1.01 0.87  CALCIUM 7.8* 7.2*   LFT Recent Labs    10/17/22 1202 10/18/22 0420  PROT 5.8* 4.7*  ALBUMIN 2.5* 2.1*  AST 20 16  ALT 10 6  ALKPHOS 65 52  BILITOT 0.5 0.4   PT/INR Recent Labs    10/17/22 1306  LABPROT 14.8  INR 1.2    Studies/Results: CT Angio Abd/Pel W and/or Wo Contrast  Result Date: 10/17/2022 CLINICAL DATA:  Blood in stool.  Low body temperature. EXAM: CTA ABDOMEN AND PELVIS WITHOUT AND WITH CONTRAST TECHNIQUE: Multidetector CT imaging of the abdomen and pelvis was performed using the standard protocol during bolus administration of intravenous contrast. Multiplanar reconstructed images and MIPs were obtained and reviewed to evaluate the vascular anatomy. RADIATION DOSE REDUCTION: This exam was performed according to the departmental dose-optimization program which includes automated exposure control, adjustment of the mA and/or kV according to patient size and/or use of iterative reconstruction technique. CONTRAST:  75mL OMNIPAQUE IOHEXOL 350 MG/ML SOLN COMPARISON:  CT abdomen and pelvis 10/08/2009 FINDINGS: VASCULAR Aorta: Atherosclerotic disease in the abdominal aorta without aneurysm, dissection or significant stenosis. Celiac: Calcified plaque at the origin of the celiac trunk with a moderate or severe stenosis. Main branch vessels of the celiac trunk are patent. Limited evaluation of the pancreatic-duodenal branches due to the extensive pancreatic calcifications. No clear  evidence for aneurysm formation. SMA: At least moderate stenosis at the origin of the SMA related to aortic plaque at the aorta. Main SMA branches are patent. Renals: High-grade stenosis in the proximal right renal artery within near occlusion. Severe high-grade stenosis in the proximal/mid left renal artery. No evidence for renal artery aneurysm. IMA: IMA is patent but there is either occlusion or high-grade stenosis at the origin. Inflow: Extensive plaque involving the bilateral iliac arteries. Mild narrowing in the proximal right common iliac artery. Widely patent stent involving the distal right common iliac artery extending into the distal right external iliac artery. Origin of the right internal iliac artery is occluded. Approximately 50% stenosis in the proximal left common iliac artery. Origin of the left internal iliac artery is occluded. Widely patent stent in the proximal left external iliac artery. Proximal Outflow: Proximal right femoral arteries are widely patent. Question a small intimal flap or dissection flap in the proximal left common femoral artery. Proximal left femoral arteries are patent. Veins: Venous structures are unremarkable. Portal venous system is patent. Review of the MIP images confirms the above findings. NON-VASCULAR Lower chest: 3 mm nodule in the left lung base image 31/6 is probably calcified. Evidence for chronic changes at the lung bases. Hepatobiliary: Probable small calcified gallstones. No evidence for gallbladder inflammatory changes. Mild intrahepatic biliary dilatation. Pancreas: Extensive calcifications throughout the pancreas, particularly the pancreatic head region. Main pancreatic duct appears to be diffusely enlarged. Focal low-density in the pancreatic head measures up to 1.2 cm and this could represent a pseudocyst formation but nonspecific. Spleen: Normal in size without focal abnormality. Adrenals/Urinary Tract: Normal adrenal glands. Negative for kidney stones  or hydronephrosis. Normal urinary bladder. Stomach/Bowel: No evidence for acute GI bleeding. Again noted is abnormal configuration of the bowel with small bowel loops predominantly on the right side of the abdomen and the cecum is left of midline. 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 There is no evidence for bowel dilatation or focal inflammation. Lymphatic: No lymph node enlargement in the abdomen or pelvis. Reproductive: Prostate is unremarkable. Other: Diffuse stranding throughout the perinephric space and increased since 2011. There is no free air free fluid. Stranding in the perirectal fat. Mild subcutaneous edema. Musculoskeletal: Severe disc space loss at L5-S1 with retrolisthesis of L5 on S1. There is vertebral body height loss at L3 and L4 likely representing old compression fractures. Disc space narrowing at L1-L2. IMPRESSION: VASCULAR 1. Diffuse atherosclerotic disease throughout the abdomen and pelvis. Aortic Atherosclerosis (ICD10-I70.0). 2. 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 Occlusion or high-grade stenosis at the origin of the IMA. These vascular findings could be associated with chronic mesenteric ischemia in the appropriate clinical setting. 3. Severe bilateral renal artery stenosis. 4. Patent bilateral iliac artery stents. 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 5. Proximal femoral arteries are patent. Question a small intimal flap or dissection involving the proximal left common femoral artery. NON-VASCULAR 1. No evidence for acute GI bleeding. 2. Intestinal malrotation. Findings are similar to the exam in 2011. No evidence for acute bowel inflammation or obstruction. 3. Diffuse calcifications throughout the pancreas compatible with prior pancreatitis. There is diffuse dilatation of the main pancreatic duct and cannot exclude a focal cystic structure in the pancreatic head measuring roughly 1.2 cm. Findings  could be better characterized with MRI. 4. Nonspecific intra-abdominal and subcutaneous edema. 5. Probable cholelithiasis. 6. 3 mm nodule at the left lung base that may be  calcified. No follow-up needed if patient is low-risk. Non-contrast chest CT can be considered in 12 months if patient is high-risk. This recommendation follows the consensus statement: Guidelines for Management of Incidental Pulmonary Nodules Detected on CT Images: From the Fleischner Society 2017; Radiology 2017; 284:228-243. Electronically Signed   By: Richarda Overlie M.D.   On: 10/17/2022 17:00   DG Chest Port 1 View  Result Date: 10/17/2022 CLINICAL DATA:  Blood in stool, questionable sepsis EXAM: PORTABLE CHEST 1 VIEW COMPARISON:  10/13/2009 FINDINGS: Cardiac and mediastinal contours are within normal limits given AP technique. No focal pulmonary opacity. No pleural effusion or pneumothorax. No acute osseous abnormality. IMPRESSION: No acute cardiopulmonary process. Electronically Signed   By: Wiliam Ke M.D.   On: 10/17/2022 12:55    Impression: Todd [name] is a 72 y.o. year old male with history of alcoholism, atrial fibrillation, HTN, HLD, gout, PVD on plavix, Schatzki's ring, internal hemorrhoids, who presented to the emergency room due burgundy stools with associated weakness, found to he hypotensive, hemoglobin of 10, fecal occult blood positive.  GI bleed: Patient reports acute onset burgundy colored blood per rectum on toilet tissue and in the stool starting 4/20 with a couple episodes daily, last episode last night around 7pm. Associated weakness. Denies any other significant GI symptoms. Admits to [facility] powders several days a week. Hgb 10.0 on admission, fairly stable compared to hemoglobin 8 months ago.  He received 1 unit PRBCs, but hemoglobin down to 9.1 this morning.  No active bleeding on CT angio yesterday.  Some of this decline may have been related to hemodilution as he received 2 L fluid bolus and was started  on 150 cc/h IV fluids. BP today remains soft, but patient reports feeling improved. Plavix currently on hold.  In the setting of frequent BC powders, concern for ulceration within the GI tract.  Could also have AVMs, polyps, and/or malignancy.  In the setting of burgundy colored stool, difficult to determine if upper or lower GI bleed.  As he has not had EGD or colonoscopy since 2009, recommend updating both EGD and colonoscopy tomorrow.   Stenosis of mesenteric arteries:  CT angio A/P with at least moderate stenosis involving the celiac trunk and SMA, occlusion or high-grade stenosis at the origin of the IMA which could be associated with chronic mesenteric ischemia.  Clinically, patient does not present with symptoms of mesenteric ischemia as he denies any postprandial abdominal pain. It does appear that he has lost 7-8 lbs over the last year. He is well established with vascular surgery.  As he does not have any symptoms at this time, no emergent intervention is needed.  Recommend outpatient evaluation.  Abnormal CT pancreas: Diffuse calcifications throughout the pancreas compatible with prior pancreatitis, diffuse dilation of the main pancreatic duct duct with inability to exclude focal cystic structure in the pancreatic head measuring 1.2 cm.  Can pursue MRI vs triphasic CT outpatient for further characterization.   Plan: IV PPI BID 500 cc fluid bolus today given soft BP EGD + Colonoscopy with propofol with Dr. Jena Gauss tomorrow. The risks, benefits, and alternatives have been discussed with the patient in detail. The patient states understanding and desires to proceed.  Clear liquids today. N.p.o. at midnight. Continue to hold Plavix for now. Monitor H&H closely and for overt GI bleeding. Follow-up with vascular surgery outpatient for evaluation of the direct artery stenosis/possible chronic mesenteric ischemia. Outpatient MRI versus triphasic CT to further characterize pancreatic  abnormalities.   LOS:  1 day    10/18/2022, 8:10 AM   Ermalinda Memos, [facility]

## 2022-10-18 NOTE — Consult Note (Signed)
@LOGO@   Referring Provider: Joseph Zammit, MD Primary Care Physician:  Fusco, Lawrence, MD Primary Gastroenterologist:  Dr. Fields previously.   Date of Admission: 10/17/22 Date of Consultation: 10/18/22  Reason for Consultation:  GI bleed  HPI:  Todd [name] is a 72 y.o. year old male with history of alcoholism, atrial fibrillation, HTN, HLD, gout, PVD, Schatzki's ring, internal hemorrhoids, who presented to the emergency room due to burgundy stools with associated generalized weakness.  ED course: Initial BP 87/50, respiratory rate 28, temp 95.6, heart rate 79, SpO2 100%.  Labs remarkable for hemoglobin 10.0, fecal occult blood positive, lactic acid 2.9, WBC 15.2.  Chest x-ray with no acute process.  CT angio A/P: No evidence for acute GI bleeding.  Intestinal malrotation (chronic).  At least moderate stenosis involving the celiac trunk and SMA, occlusion or high-grade stenosis at the origin of the IMA which could be associated with chronic mesenteric ischemia, severe bilateral renal artery stenosis, occlusion of proximal internal iliac artery bilaterally, patent bilateral iliac artery stents,, question of small intimal flap or dissection involving proximal left common femoral artery.  Diffuse calcifications throughout the pancreas compatible with prior pancreatitis, diffuse dilation of main pancreatic duct with inability to exclude focal cystic structure in the pancreatic head measuring 1.2 cm.  He received 2 L fluid bolus and was started on LR 150 cc/h, IV Protonix twice daily, and ceftriaxone.  He also received 1 unit PRBCs.  Made NPO at midnight.   Today, hemoglobin down to 9.1. Leukocytosis resolved, WBC 9.0. Lactic acidosis resolved.  Consult:  Burgandy stool started Saturday at 4pm. Just on toilet tissue and on the stool. Continued with intermittent symptoms Sunday and Monday and decided he should come to the ER as he began to feel weak. 2 total episodes on Monday. Last  episode around 7pm. No bleeding today. No diarrhea. No abdominal pain. Just knows he needs to have a BM which is his baseline.   Took iron Sunday.    Generally is active. Does all the yard work. Runs a tiller, weed eats, lifts bags of fertilizer. Hasn't paid attention to his weight. Eats when he wants and stops when he is full. No postprandial abdominal pain. No nausea, vomiting, reflux symptoms, or dysphagia.   NSAIDs: BC powder several mornings a week.  Alcohol: Drinks 1-2 beer on Sunday while watching the race.   Denies any history of pancreatitis.  Prior EGDs:  05/17/2008 with patent Schatzki's ring. 07/07/2007 with Candida esophagitis, diffuse erythema in the body and antrum of the stomach without ulceration, occasional erosion, biopsies negative for H. pylori.  Prior colonoscopies:  05/17/2008 with extremely tortuous colon that did not allow for distal TI intubation, otherwise normal exam.  He did have evidence of moderate internal hemorrhoids. 06/02/2006 with normal exam.    Past Medical History:  Diagnosis Date   Alcoholism    History of withdrawal and seizures   Atrial fibrillation    Taken off of Coumadin in 2009 in the setting of GI bleed   Chronic back pain    Colitis    4/11   Essential hypertension    Gout    Heart murmur    History of GI bleed    Internal hemorrhoids    Colonoscopy 11/09   Osteoarthritis    Peripheral vascular disease    Schatzki's ring    EGD 11/09    Past Surgical History:  Procedure Laterality Date   ABDOMINAL AORTOGRAM N/A 03/08/2019   Procedure: ABDOMINAL AORTOGRAM;    Surgeon: Fields, Lakeem E, MD;  Location: [facility];  Service: Cardiovascular;  Laterality: N/A;   ABDOMINAL AORTOGRAM W/LOWER EXTREMITY N/A 03/02/2021   Procedure: ABDOMINAL AORTOGRAM W/LOWER EXTREMITY;  Surgeon: Brabham, Vance W, MD;  Location: [facility];  Service: Cardiovascular;  Laterality: N/A;   APPLICATION OF WOUND VAC Right 07/22/2021   Procedure:  APPLICATION OF WOUND VAC;  Surgeon: Brabham, Vance W, MD;  Location: [facility];  Service: Vascular;  Laterality: Right;   ENDARTERECTOMY Left 02/02/2022   Procedure: LEFT ENDARTERECTOMY CAROTID;  Surgeon: Brabham, Vance W, MD;  Location: [facility];  Service: Vascular;  Laterality: Left;   Excision of gouty lesion     Left index finger   FEMORAL-POPLITEAL BYPASS GRAFT Right 03/12/2021   Procedure: RIGHT FEMORAL TO POPLITEAL ARTERY BYPASS GRAFTING USING THE PROPATEN GRAFT;  Surgeon: Brabham, Vance W, MD;  Location: [facility];  Service: Vascular;  Laterality: Right;   GROIN DEBRIDEMENT Right 07/22/2021   Procedure: RIGHT GROIN DEBRIDEMENT;  Surgeon: Brabham, Vance W, MD;  Location: [facility];  Service: Vascular;  Laterality: Right;   INSERTION OF ILIAC STENT Right 03/12/2021   Procedure: INSERTION OF  EXTERNAL ILIAC STENT;  Surgeon: Brabham, Vance W, MD;  Location: [facility];  Service: Vascular;  Laterality: Right;   LOWER EXTREMITY ANGIOGRAM Right 03/12/2021   Procedure: LOWER EXTREMITY ANGIOGRAM;  Surgeon: Brabham, Vance W, MD;  Location: [facility];  Service: Vascular;  Laterality: Right;   LOWER EXTREMITY ANGIOGRAPHY Bilateral 03/08/2019   Procedure: Lower Extremity Angiography;  Surgeon: Fields, Arron E, MD;  Location: [facility];  Service: Cardiovascular;  Laterality: Bilateral;   LOWER EXTREMITY ANGIOGRAPHY N/A 03/15/2021   Procedure: LOWER EXTREMITY ANGIOGRAPHY;  Surgeon: Cain, Brandon Christopher, MD;  Location: [facility];  Service: Cardiovascular;  Laterality: N/A;   MASS EXCISION Right 07/07/2020   Procedure: EXCISION OF RIGHT FACIAL MASS;  Surgeon: Teoh, Su, MD;  Location: [facility];  Service: ENT;  Laterality: Right;   PATCH ANGIOPLASTY Left 02/02/2022   Procedure: PATCH ANGIOPLASTY OF LEFT CAROTID USING XENOSURE BOVINE PERICARDIUM PATCH;  Surgeon: Brabham, Vance W, MD;  Location: [facility];  Service: Vascular;  Laterality: Left;   PERIPHERAL VASCULAR BALLOON ANGIOPLASTY Right 03/02/2021    Procedure: PERIPHERAL VASCULAR BALLOON ANGIOPLASTY;  Surgeon: Brabham, Vance W, MD;  Location: [facility];  Service: Cardiovascular;  Laterality: Right;  external iliac   PERIPHERAL VASCULAR INTERVENTION Bilateral 03/08/2019   Procedure: PERIPHERAL VASCULAR INTERVENTION;  Surgeon: Fields, Tilford E, MD;  Location: [facility];  Service: Cardiovascular;  Laterality: Bilateral;  ext iliac artery stent   PERIPHERAL VASCULAR INTERVENTION Left 03/02/2021   Procedure: PERIPHERAL VASCULAR INTERVENTION;  Surgeon: Brabham, Vance W, MD;  Location: [facility];  Service: Cardiovascular;  Laterality: Left;  external iliac   PERIPHERAL VASCULAR INTERVENTION Right 03/15/2021   Procedure: PERIPHERAL VASCULAR INTERVENTION;  Surgeon: Cain, Brandon Christopher, MD;  Location: [facility];  Service: Cardiovascular;  Laterality: Right;  external iliac   Right knee arthroscopy     SPINE SURGERY      Prior to Admission medications   Medication Sig Start Date End Date Taking? Authorizing Provider  amLODipine (NORVASC) 5 MG tablet Take 0.5 tablets (2.5 mg total) by mouth daily. 09/12/22 09/07/23 Yes McDowell, Samuel G, MD  aspirin EC 81 MG EC tablet Take 1 tablet (81 mg total) by mouth daily at 6 (six) AM. Swallow whole. 03/17/21  Yes Setzer, Sandra J, PA-C    clopidogrel (PLAVIX) 75 MG tablet TAKE ONE TABLET BY MOUTH ONCE DAILY. 01/06/22  Yes McDowell, Samuel G, MD  rosuvastatin (CRESTOR) 10 MG tablet TAKE (1) TABLET BY MOUTH ONCE DAILY. 09/09/20  Yes Cain, Brandon Christopher, MD    Current Facility-Administered Medications  Medication Dose Route Frequency Provider Last Rate Last Admin   acetaminophen (TYLENOL) tablet 650 mg  650 mg Oral Q6H PRN Adefeso, Oladapo, DO       Or   acetaminophen (TYLENOL) suppository 650 mg  650 mg Rectal Q6H PRN Adefeso, Oladapo, DO       amLODipine (NORVASC) tablet 2.5 mg  2.5 mg Oral Daily Adefeso, Oladapo, DO       feeding supplement (ENSURE ENLIVE / ENSURE PLUS)  liquid 237 mL  237 mL Oral BID BM Adefeso, Oladapo, DO       lactated ringers infusion   Intravenous Continuous Adefeso, Oladapo, DO 150 mL/hr at 10/18/22 0500 New Bag at 10/18/22 0500   ondansetron (ZOFRAN) tablet 4 mg  4 mg Oral Q6H PRN Adefeso, Oladapo, DO       Or   ondansetron (ZOFRAN) injection 4 mg  4 mg Intravenous Q6H PRN Adefeso, Oladapo, DO       Oral care mouth rinse  15 mL Mouth Rinse PRN Adefeso, Oladapo, DO       pantoprazole (PROTONIX) injection 40 mg  40 mg Intravenous Q12H Adefeso, Oladapo, DO   40 mg at 10/17/22 2256   rosuvastatin (CRESTOR) tablet 10 mg  10 mg Oral Daily Adefeso, Oladapo, DO        Allergies as of 10/17/2022   (No Known Allergies)    Family History  Problem Relation Age of Onset   Coronary artery disease Father    Coronary artery disease Sister        MI at age 58    Social History   Socioeconomic History   Marital status: Married    Spouse name: Not on file   Number of children: 3   Years of education: Not on file   Highest education level: Not on file  Occupational History   Occupation: Retired    Employer: [employer]  Tobacco Use   Smoking status: Every Day    Packs/day: .5    Types: Cigarettes    Passive exposure: Current   Smokeless tobacco: Never   Tobacco comments:    0.25-0.5 packs per day  Vaping Use   Vaping Use: Never used  Substance and Sexual Activity   Alcohol use: Yes    Alcohol/week: 5.0 standard drinks of alcohol    Types: 5 Cans of beer per week    Comment: less than a 6 pack of beer per week   Drug use: No   Sexual activity: Never  Other Topics Concern   Not on file  Social History Narrative   Not on file   Social Determinants of Health   Financial Resource Strain: Not on file  Food Insecurity: No Food Insecurity (10/17/2022)   Hunger Vital Sign    Worried About Running Out of Food in the Last Year: Never true    Ran Out of Food in the Last Year: Never true  Transportation Needs: No  Transportation Needs (10/17/2022)   PRAPARE - Transportation    Lack of Transportation (Medical): No    Lack of Transportation (Non-Medical): No  Physical Activity: Not on file  Stress: Not on file  Social Connections: Not on file  Intimate Partner Violence: Not At Risk (10/17/2022)     Humiliation, Afraid, Rape, and Kick questionnaire    Fear of Current or Ex-Partner: No    Emotionally Abused: No    Physically Abused: No    Sexually Abused: No    Review of Systems: Gen: Denies fever, chills, cold or symptoms, presyncope, syncope. CV: Denies chest pain, heart palpitations. Resp: Denies shortness of breath, cough. GI: See HPI GU : Denies urinary burning, urinary frequency, urinary incontinence.  Derm: Denies rash. Psych: Denies depression, anxiety. Heme: See HPI  Physical Exam: Vital signs in last 24 hours: Temp:  [92.2 F (33.4 C)-98.9 F (37.2 C)] 98.2 F (36.8 C) (04/23 0552) Pulse Rate:  [40-134] 82 (04/23 0552) Resp:  [8-33] 18 (04/23 0552) BP: (71-133)/(43-82) 96/65 (04/23 0552) SpO2:  [63 %-100 %] 99 % (04/23 0552) Weight:  [54.3 kg-56.1 kg] 54.3 kg (04/22 2213) Last BM Date : 10/17/22 (per pt report) General:   Alert,  well-developed, thin, elderly gentleman, pleasant and cooperative in NAD Head:  Normocephalic and atraumatic. Eyes:  Sclera clear, no icterus.   Conjunctiva pink. Ears: Decreased auditory acuity. Lungs:  Clear throughout to auscultation.   No wheezes, crackles, or rhonchi. No acute distress. Heart:  Regular rate and rhythm; no murmurs, clicks, rubs,  or gallops. Abdomen:  Soft, nontender and nondistended. No masses, hepatosplenomegaly or hernias noted. Normal bowel sounds, without guarding, and without rebound.   Rectal:  Deferred   Msk:  Symmetrical without gross deformities. Normal posture. Extremities:  Without edema. Neurologic:  Alert and  oriented x4;  grossly normal neurologically. Skin:  Intact without significant lesions or rashes. Psych:   Normal mood and affect.  Intake/Output from previous day: 04/22 0701 - 04/23 0700 In: 3472.2 [I.V.:1000; Blood:380; IV Piggyback:2092.2] Out: 300 [Urine:300] Intake/Output this shift: No intake/output data recorded.  Lab Results: Recent Labs    10/17/22 1202 10/18/22 0420  WBC 15.2* 9.0  HGB 10.0* 9.1*  HCT 31.2* 27.6*  PLT 215 148*   BMET Recent Labs    10/17/22 1202 10/18/22 0420  NA 139 137  K 4.1 3.4*  CL 106 109  CO2 22 22  GLUCOSE 156* 82  BUN 22 16  CREATININE 1.01 0.87  CALCIUM 7.8* 7.2*   LFT Recent Labs    10/17/22 1202 10/18/22 0420  PROT 5.8* 4.7*  ALBUMIN 2.5* 2.1*  AST 20 16  ALT 10 6  ALKPHOS 65 52  BILITOT 0.5 0.4   PT/INR Recent Labs    10/17/22 1306  LABPROT 14.8  INR 1.2    Studies/Results: CT Angio Abd/Pel W and/or Wo Contrast  Result Date: 10/17/2022 CLINICAL DATA:  Blood in stool.  Low body temperature. EXAM: CTA ABDOMEN AND PELVIS WITHOUT AND WITH CONTRAST TECHNIQUE: Multidetector CT imaging of the abdomen and pelvis was performed using the standard protocol during bolus administration of intravenous contrast. Multiplanar reconstructed images and MIPs were obtained and reviewed to evaluate the vascular anatomy. RADIATION DOSE REDUCTION: This exam was performed according to the departmental dose-optimization program which includes automated exposure control, adjustment of the mA and/or kV according to patient size and/or use of iterative reconstruction technique. CONTRAST:  75mL OMNIPAQUE IOHEXOL 350 MG/ML SOLN COMPARISON:  CT abdomen and pelvis 10/08/2009 FINDINGS: VASCULAR Aorta: Atherosclerotic disease in the abdominal aorta without aneurysm, dissection or significant stenosis. Celiac: Calcified plaque at the origin of the celiac trunk with a moderate or severe stenosis. Main branch vessels of the celiac trunk are patent. Limited evaluation of the pancreatic-duodenal branches due to the extensive pancreatic calcifications. No clear    evidence for aneurysm formation. SMA: At least moderate stenosis at the origin of the SMA related to aortic plaque at the aorta. Main SMA branches are patent. Renals: High-grade stenosis in the proximal right renal artery within near occlusion. Severe high-grade stenosis in the proximal/mid left renal artery. No evidence for renal artery aneurysm. IMA: IMA is patent but there is either occlusion or high-grade stenosis at the origin. Inflow: Extensive plaque involving the bilateral iliac arteries. Mild narrowing in the proximal right common iliac artery. Widely patent stent involving the distal right common iliac artery extending into the distal right external iliac artery. Origin of the right internal iliac artery is occluded. Approximately 50% stenosis in the proximal left common iliac artery. Origin of the left internal iliac artery is occluded. Widely patent stent in the proximal left external iliac artery. Proximal Outflow: Proximal right femoral arteries are widely patent. Question a small intimal flap or dissection flap in the proximal left common femoral artery. Proximal left femoral arteries are patent. Veins: Venous structures are unremarkable. Portal venous system is patent. Review of the MIP images confirms the above findings. NON-VASCULAR Lower chest: 3 mm nodule in the left lung base image 31/6 is probably calcified. Evidence for chronic changes at the lung bases. Hepatobiliary: Probable small calcified gallstones. No evidence for gallbladder inflammatory changes. Mild intrahepatic biliary dilatation. Pancreas: Extensive calcifications throughout the pancreas, particularly the pancreatic head region. Main pancreatic duct appears to be diffusely enlarged. Focal low-density in the pancreatic head measures up to 1.2 cm and this could represent a pseudocyst formation but nonspecific. Spleen: Normal in size without focal abnormality. Adrenals/Urinary Tract: Normal adrenal glands. Negative for kidney stones  or hydronephrosis. Normal urinary bladder. Stomach/Bowel: No evidence for acute GI bleeding. Again noted is abnormal configuration of the bowel with small bowel loops predominantly on the right side of the abdomen and the cecum is left of midline. 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 There is no evidence for bowel dilatation or focal inflammation. Lymphatic: No lymph node enlargement in the abdomen or pelvis. Reproductive: Prostate is unremarkable. Other: Diffuse stranding throughout the perinephric space and increased since 2011. There is no free air free fluid. Stranding in the perirectal fat. Mild subcutaneous edema. Musculoskeletal: Severe disc space loss at L5-S1 with retrolisthesis of L5 on S1. There is vertebral body height loss at L3 and L4 likely representing old compression fractures. Disc space narrowing at L1-L2. IMPRESSION: VASCULAR 1. Diffuse atherosclerotic disease throughout the abdomen and pelvis. Aortic Atherosclerosis (ICD10-I70.0). 2. 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 Occlusion or high-grade stenosis at the origin of the IMA. These vascular findings could be associated with chronic mesenteric ischemia in the appropriate clinical setting. 3. Severe bilateral renal artery stenosis. 4. Patent bilateral iliac artery stents. 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 5. Proximal femoral arteries are patent. Question a small intimal flap or dissection involving the proximal left common femoral artery. NON-VASCULAR 1. No evidence for acute GI bleeding. 2. Intestinal malrotation. Findings are similar to the exam in 2011. No evidence for acute bowel inflammation or obstruction. 3. Diffuse calcifications throughout the pancreas compatible with prior pancreatitis. There is diffuse dilatation of the main pancreatic duct and cannot exclude a focal cystic structure in the pancreatic head measuring roughly 1.2 cm. Findings  could be better characterized with MRI. 4. Nonspecific intra-abdominal and subcutaneous edema. 5. Probable cholelithiasis. 6. 3 mm nodule at the left lung base that may be   calcified. No follow-up needed if patient is low-risk. Non-contrast chest CT can be considered in 12 months if patient is high-risk. This recommendation follows the consensus statement: Guidelines for Management of Incidental Pulmonary Nodules Detected on CT Images: From the Fleischner Society 2017; Radiology 2017; 284:228-243. Electronically Signed   By: Adam  Henn M.D.   On: 10/17/2022 17:00   DG Chest Port 1 View  Result Date: 10/17/2022 CLINICAL DATA:  Blood in stool, questionable sepsis EXAM: PORTABLE CHEST 1 VIEW COMPARISON:  10/13/2009 FINDINGS: Cardiac and mediastinal contours are within normal limits given AP technique. No focal pulmonary opacity. No pleural effusion or pneumothorax. No acute osseous abnormality. IMPRESSION: No acute cardiopulmonary process. Electronically Signed   By: Alison  Vasan M.D.   On: 10/17/2022 12:55    Impression: Todd [name] is a 71 y.o. year old male with history of alcoholism, atrial fibrillation, HTN, HLD, gout, PVD on plavix, Schatzki's ring, internal hemorrhoids, who presented to the emergency room due burgundy stools with associated weakness, found to he hypotensive, hemoglobin of 10, fecal occult blood positive.  GI bleed: Patient reports acute onset burgundy colored blood per rectum on toilet tissue and in the stool starting 4/20 with a couple episodes daily, last episode last night around 7pm. Associated weakness. Denies any other significant GI symptoms. Admits to BC powders several days a week. Hgb 10.0 on admission, fairly stable compared to hemoglobin 8 months ago.  He received 1 unit PRBCs, but hemoglobin down to 9.1 this morning.  No active bleeding on CT angio yesterday.  Some of this decline may have been related to hemodilution as he received 2 L fluid bolus and was started  on 150 cc/h IV fluids. BP today remains soft, but patient reports feeling improved. Plavix currently on hold.  In the setting of frequent BC powders, concern for ulceration within the GI tract.  Could also have AVMs, polyps, and/or malignancy.  In the setting of burgundy colored stool, difficult to determine if upper or lower GI bleed.  As he has not had EGD or colonoscopy since 2009, recommend updating both EGD and colonoscopy tomorrow.   Stenosis of mesenteric arteries:  CT angio A/P with at least moderate stenosis involving the celiac trunk and SMA, occlusion or high-grade stenosis at the origin of the IMA which could be associated with chronic mesenteric ischemia.  Clinically, patient does not present with symptoms of mesenteric ischemia as he denies any postprandial abdominal pain. It does appear that he has lost 7-8 lbs over the last year. He is well established with vascular surgery.  As he does not have any symptoms at this time, no emergent intervention is needed.  Recommend outpatient evaluation.  Abnormal CT pancreas: Diffuse calcifications throughout the pancreas compatible with prior pancreatitis, diffuse dilation of the main pancreatic duct duct with inability to exclude focal cystic structure in the pancreatic head measuring 1.2 cm.  Can pursue MRI vs triphasic CT outpatient for further characterization.   Plan: IV PPI BID 500 cc fluid bolus today given soft BP EGD + Colonoscopy with propofol with Dr. Rourk tomorrow. The risks, benefits, and alternatives have been discussed with the patient in detail. The patient states understanding and desires to proceed.  Clear liquids today. N.p.o. at midnight. Continue to hold Plavix for now. Monitor H&H closely and for overt GI bleeding. Follow-up with vascular surgery outpatient for evaluation of the direct artery stenosis/possible chronic mesenteric ischemia. Outpatient MRI versus triphasic CT to further characterize pancreatic  abnormalities.   LOS:   1 day    10/18/2022, 8:10 AM   Merrilyn Legler, PA-C [facility]   

## 2022-10-19 ENCOUNTER — Encounter (HOSPITAL_COMMUNITY)
Admission: EM | Disposition: A | Discharge status: Home / Self Care | Payer: Self-pay | Source: Home / Self Care | Attending: Family Medicine

## 2022-10-19 ENCOUNTER — Encounter (HOSPITAL_COMMUNITY): Payer: Self-pay | Admitting: Internal Medicine

## 2022-10-19 DIAGNOSIS — K921 Melena: Secondary | ICD-10-CM | POA: Diagnosis not present

## 2022-10-19 DIAGNOSIS — I10 Essential (primary) hypertension: Secondary | ICD-10-CM | POA: Diagnosis not present

## 2022-10-19 DIAGNOSIS — D72829 Elevated white blood cell count, unspecified: Secondary | ICD-10-CM | POA: Diagnosis not present

## 2022-10-19 DIAGNOSIS — R531 Weakness: Secondary | ICD-10-CM | POA: Diagnosis not present

## 2022-10-19 DIAGNOSIS — D696 Thrombocytopenia, unspecified: Secondary | ICD-10-CM

## 2022-10-19 LAB — CBC
HCT: 27.7 % — ABNORMAL LOW (ref 39.0–52.0)
Hemoglobin: 8.9 g/dL — ABNORMAL LOW (ref 13.0–17.0)
MCH: 31 pg (ref 26.0–34.0)
MCHC: 32.1 g/dL (ref 30.0–36.0)
MCV: 96.5 fL (ref 80.0–100.0)
Platelets: 138 10*3/uL — ABNORMAL LOW (ref 150–400)
RBC: 2.87 MIL/uL — ABNORMAL LOW (ref 4.22–5.81)
RDW: 14.7 % (ref 11.5–15.5)
WBC: 7.7 10*3/uL (ref 4.0–10.5)
nRBC: 0 % (ref 0.0–0.2)

## 2022-10-19 LAB — BASIC METABOLIC PANEL
Anion gap: 6 (ref 5–15)
BUN: 9 mg/dL (ref 8–23)
CO2: 23 mmol/L (ref 22–32)
Calcium: 7.5 mg/dL — ABNORMAL LOW (ref 8.9–10.3)
Chloride: 104 mmol/L (ref 98–111)
Creatinine, Ser: 0.76 mg/dL (ref 0.61–1.24)
GFR, Estimated: >60 mL/min (ref >60)
Glucose, Bld: 80 mg/dL (ref 70–99)
Potassium: 3.6 mmol/L (ref 3.5–5.1)
Sodium: 133 mmol/L — ABNORMAL LOW (ref 135–145)

## 2022-10-19 LAB — MAGNESIUM: Magnesium: 1.8 mg/dL (ref 1.7–2.4)

## 2022-10-19 SURGERY — ESOPHAGOGASTRODUODENOSCOPY (EGD) WITH PROPOFOL
Anesthesia: General

## 2022-10-19 MED ORDER — LIDOCAINE HCL (PF) 2 % IJ SOLN
INTRAMUSCULAR | Status: AC
  Filled 2022-10-19: qty 5

## 2022-10-19 MED ORDER — LACTATED RINGERS IV SOLN: INTRAVENOUS | Status: DC

## 2022-10-19 MED ORDER — PROPOFOL 500 MG/50ML IV EMUL
INTRAVENOUS | Status: AC
  Filled 2022-10-19: qty 50

## 2022-10-19 NOTE — Anesthesia Preprocedure Evaluation (Signed)
Anesthesia Evaluation  Patient identified by MRN, date of birth, ID band Patient awake    Reviewed: Allergy & Precautions, H&P , NPO status , Patient's Chart, lab work & pertinent test results  Airway Mallampati: II  TM Distance: >3 FB Neck ROM: Full    Dental no notable dental hx. (+) Dental Advisory Given, Edentulous Upper, Poor Dentition, Chipped,    Pulmonary Current Smoker and Patient abstained from smoking.   Pulmonary exam normal breath sounds clear to auscultation       Cardiovascular hypertension, Pt. on medications + Peripheral Vascular Disease  Normal cardiovascular exam+ dysrhythmias Atrial Fibrillation + Valvular Problems/Murmurs  Rhythm:Regular Rate:Normal     Neuro/Psych negative neurological ROS  negative psych ROS   GI/Hepatic negative GI ROS,,,(+)     substance abuse  alcohol use  Endo/Other  negative endocrine ROS    Renal/GU negative Renal ROS  negative genitourinary   Musculoskeletal  (+) Arthritis , Osteoarthritis,    Abdominal   Peds negative pediatric ROS (+)  Hematology negative hematology ROS (+)   Anesthesia Other Findings   Reproductive/Obstetrics negative OB ROS                             Anesthesia Physical Anesthesia Plan  ASA: 3  Anesthesia Plan: General   Post-op Pain Management: Minimal or no pain anticipated   Induction: Intravenous  PONV Risk Score and Plan: Propofol infusion  Airway Management Planned: Nasal Cannula and Natural Airway  Additional Equipment:   Intra-op Plan:   Post-operative Plan:   Informed Consent: I have reviewed the patients History and Physical, chart, labs and discussed the procedure including the risks, benefits and alternatives for the proposed anesthesia with the patient or authorized representative who has indicated his/her understanding and acceptance.     Dental advisory given  Plan Discussed with:  CRNA and Surgeon  Anesthesia Plan Comments:         Anesthesia Quick Evaluation

## 2022-10-19 NOTE — Progress Notes (Signed)
PROGRESS NOTE    Todd [name]  ZOX:[id number] DOB: 1950-12-14 DOA: 10/17/2022 PCP: Elfredia Nevins, MD   Brief Narrative: Todd [name] is a 72 y.o. male with a history of hypertension, hyperlipidemia, alcohol abuse, tobacco use, PAD. Patient presented secondary to frequent black stools concerning for upper GI bleeding. Protonix IV started and GI consulted with plan for upper and lower endoscopies.   Assessment and Plan:  GI bleeding FOBT positive. Complicated by NSAID use and Plavix use. GI consulted with plan for upper and lower endoscopies today, 4/24. Bloody stools have cleared with bowel prep. -GI recommendations/management -Continue Protonix  Chronic anemia Acute blood loss anemia Baseline hemoglobin is between 11-13. Hemoglobin of 10 on admission with downward drift. Hemoglobin of 8.9 today but appears stable. Acute anemia secondary to GI bleeding.  Generalized weakness Likely related to blood loss anemia. -Mobility with nursing  Hypokalemia Supplementation given. Resolved.  Hypomagnesemia Supplementation given. Resolved.  Lactic acidosis Lactic acid of 2.9 on admission. Resolved after fluids down to 1.5.  Leukocytosis Present on admission. Likely reactive. Resolved.  Moderate malnutrition Noted.  Hyponatremia Mild. Asymptomatic. Follow-up metabolic panel as an outpatient.  Thrombocytopenia Mild. Likely reactive.  Primary hypertension Patient is on amlodipine as an outpatient which was initially restarted but stopped secondary low-normal blood pressure. Patient did have mild hypotension in the ED prior to admission. Midodrine started. 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 Asymptomatic. Patient follows with vascular surgery as an outpatient.  Severe bilateral renal artery stenosis Noted on CT imaging. Patient follows with vascular  surgery as an outpatient.  Bilateral carotid artery stenosis Patient is s/p left carotid endarterectomy in 01/2023. Patient follows with vascular surgery and is on Plavix. Plavix held secondary to GI bleeding.  Hyperlipidemia -Continue Crestor  DVT prophylaxis: SCDs Code Status:   Code Status: Full Code Family Communication: Wife at bedside Disposition Plan: Discharge home likely in 24 hours pending GI recommendations/management   Consultants:  Gastroenterology  Procedures:  None  Antimicrobials: None    Subjective: Patient reports being thirsty/hungry. No bloody stool overnight near end of prep.  Objective: BP 102/65 (BP Location: Left Arm)   Pulse 78   Temp 97.7 F (36.5 C) (Oral)   Resp 20   Ht 5\' 8"  (1.727 m)   Wt 54.3 kg   SpO2 97%   BMI 18.20 kg/m   Examination:  General exam: Appears calm and comfortable Respiratory system: Clear to auscultation. Respiratory effort normal. Cardiovascular system: S1 & S2 heard, RRR. No murmurs, rubs, gallops or clicks. Gastrointestinal system: Abdomen is nondistended, soft and nontender. Normal bowel sounds heard. Central nervous system: Alert and oriented. No focal neurological deficits. Musculoskeletal: No edema. No calf tenderness Skin: No cyanosis. No rashes Psychiatry: Judgement and insight appear normal. Mood & affect appropriate.    Data Reviewed: I have personally reviewed following labs and imaging studies  CBC Lab Results  Component Value Date   WBC 7.7 10/19/2022   RBC 2.87 (L) 10/19/2022   HGB 8.9 (L) 10/19/2022   HCT 27.7 (L) 10/19/2022   MCV 96.5 10/19/2022   MCH 31.0 10/19/2022   PLT 138 (L) 10/19/2022   MCHC 32.1 10/19/2022   RDW 14.7 10/19/2022   LYMPHSABS 1.6 10/17/2022   MONOABS 0.7 10/17/2022   EOSABS 0.0 10/17/2022   BASOSABS 0.1 10/17/2022     Last metabolic panel Lab Results  Component Value Date   NA  133 (L) 10/19/2022   K 3.6 10/19/2022   CL 104 10/19/2022   CO2 23 10/19/2022    BUN 9 10/19/2022   CREATININE 0.76 10/19/2022   GLUCOSE 80 10/19/2022   GFRNONAA >60 10/19/2022   GFRAA  10/06/2010    >60        The eGFR has been calculated using the MDRD equation. This calculation has not been validated in all clinical situations. eGFR's persistently <60 mL/min signify possible Chronic Kidney Disease.   CALCIUM 7.5 (L) 10/19/2022   PHOS 2.2 (L) 10/18/2022   PROT 4.7 (L) 10/18/2022   ALBUMIN 2.1 (L) 10/18/2022   BILITOT 0.4 10/18/2022   ALKPHOS 52 10/18/2022   AST 16 10/18/2022   ALT 6 10/18/2022   ANIONGAP 6 10/19/2022    GFR: Estimated Creatinine Clearance: 64.1 mL/min (by C-G formula based on SCr of 0.76 mg/dL).  Recent Results (from the past 240 hour(s))  Blood Culture (routine x 2)     Status: None (Preliminary result)   Collection Time: 10/17/22 12:56 PM   Specimen: BLOOD RIGHT FOREARM  Result Value Ref Range Status   Specimen Description   Final    BLOOD RIGHT FOREARM BOTTLES DRAWN AEROBIC AND ANAEROBIC   Special Requests Blood Culture adequate volume  Final   Culture   Final    NO GROWTH 2 DAYS Performed at [facility], [address] [place], [place] [address]    Report Status PENDING  Incomplete  Blood Culture (routine x 2)     Status: None (Preliminary result)   Collection Time: 10/17/22  1:06 PM   Specimen: Right Antecubital; Blood  Result Value Ref Range Status   Specimen Description   Final    RIGHT ANTECUBITAL BOTTLES DRAWN AEROBIC AND ANAEROBIC   Special Requests   Final    Blood Culture results may not be optimal due to an excessive volume of blood received in culture bottles   Culture   Final    NO GROWTH 2 DAYS Performed at [facility], [address], [place], [place] [address]    Report Status PENDING  Incomplete  Resp panel by RT-PCR (RSV, Flu A&B, Covid) Anterior Nasal Swab     Status: None   Collection Time: 10/17/22  5:54 PM   Specimen: Anterior Nasal Swab  Result Value Ref Range Status   SARS Coronavirus 2 by  RT PCR NEGATIVE NEGATIVE Final    Comment: (NOTE) SARS-CoV-2 target nucleic acids are NOT DETECTED.  The SARS-CoV-2 RNA is generally detectable in upper respiratory specimens during the acute phase of infection. The lowest concentration of SARS-CoV-2 viral copies this assay can detect is 138 copies/mL. A negative result does not preclude SARS-Cov-2 infection and should not be used as the sole basis for treatment or other patient management decisions. A negative result may occur with  improper specimen collection/handling, submission of specimen other than nasopharyngeal swab, presence of viral mutation(s) within the areas targeted by this assay, and inadequate number of viral copies(<138 copies/mL). A negative result must be combined with clinical observations, patient history, and epidemiological information. The expected result is Negative.  Fact Sheet for Patients:  BloggerCourse.com  Fact Sheet for Healthcare Providers:  SeriousBroker.it  This test is no t yet approved or cleared by the [place] FDA and  has been authorized for detection and/or diagnosis of SARS-CoV-2 by FDA under an Emergency Use Authorization (EUA). This EUA will remain  in effect (meaning this test can be used) for the duration of the  COVID-19 declaration under Section 564(b)(1) of the Act, 21 U.S.C.section 360bbb-3(b)(1), unless the authorization is terminated  or revoked sooner.       Influenza A by PCR NEGATIVE NEGATIVE Final   Influenza B by PCR NEGATIVE NEGATIVE Final    Comment: (NOTE) The Xpert Xpress SARS-CoV-2/FLU/RSV plus assay is intended as an aid in the diagnosis of influenza from Nasopharyngeal swab specimens and should not be used as a sole basis for treatment. Nasal washings and aspirates are unacceptable for Xpert Xpress SARS-CoV-2/FLU/RSV testing.  Fact Sheet for Patients: BloggerCourse.com  Fact Sheet  for Healthcare Providers: SeriousBroker.it  This test is not yet approved or cleared by the [place] FDA and has been authorized for detection and/or diagnosis of SARS-CoV-2 by FDA under an Emergency Use Authorization (EUA). This EUA will remain in effect (meaning this test can be used) for the duration of the COVID-19 declaration under Section 564(b)(1) of the Act, 21 U.S.C. section 360bbb-3(b)(1), unless the authorization is terminated or revoked.     Resp Syncytial Virus by PCR NEGATIVE NEGATIVE Final    Comment: (NOTE) Fact Sheet for Patients: BloggerCourse.com  Fact Sheet for Healthcare Providers: SeriousBroker.it  This test is not yet approved or cleared by the [place] FDA and has been authorized for detection and/or diagnosis of SARS-CoV-2 by FDA under an Emergency Use Authorization (EUA). This EUA will remain in effect (meaning this test can be used) for the duration of the COVID-19 declaration under Section 564(b)(1) of the Act, 21 U.S.C. section 360bbb-3(b)(1), unless the authorization is terminated or revoked.  Performed at [facility], [address], [place], [place] [address]       Radiology Studies: CT Angio Abd/Pel W and/or Wo Contrast  Result Date: 10/17/2022 CLINICAL DATA:  Blood in stool.  Low body temperature. EXAM: CTA ABDOMEN AND PELVIS WITHOUT AND WITH CONTRAST TECHNIQUE: Multidetector CT imaging of the abdomen and pelvis was performed using the standard protocol during bolus administration of intravenous contrast. Multiplanar reconstructed images and MIPs were obtained and reviewed to evaluate the vascular anatomy. RADIATION DOSE REDUCTION: This exam was performed according to the departmental dose-optimization program which includes automated exposure control, adjustment of the mA and/or kV according to patient size and/or use of iterative reconstruction technique.  CONTRAST:  75mL OMNIPAQUE IOHEXOL 350 MG/ML SOLN COMPARISON:  CT abdomen and pelvis 10/08/2009 FINDINGS: VASCULAR Aorta: Atherosclerotic disease in the abdominal aorta without aneurysm, dissection or significant stenosis. Celiac: Calcified plaque at the origin of the celiac trunk with a moderate or severe stenosis. Main branch vessels of the celiac trunk are patent. Limited evaluation of the pancreatic-duodenal branches due to the extensive pancreatic calcifications. No clear evidence for aneurysm formation. SMA: At least moderate stenosis at the origin of the SMA related to aortic plaque at the aorta. Main SMA branches are patent. Renals: High-grade stenosis in the proximal right renal artery within near occlusion. Severe high-grade stenosis in the proximal/mid left renal artery. No evidence for renal artery aneurysm. IMA: IMA is patent but there is either occlusion or high-grade stenosis at the origin. Inflow: Extensive plaque involving the bilateral iliac arteries. Mild narrowing in the proximal right common iliac artery. Widely patent stent involving the distal right common iliac artery extending into the distal right external iliac artery. Origin of the right internal iliac artery is occluded. Approximately 50% stenosis in the proximal left common iliac artery. Origin of the left internal iliac artery is occluded. Widely patent stent in the proximal left external iliac artery. Proximal  Outflow: Proximal right femoral arteries are widely patent. Question a small intimal flap or dissection flap in the proximal left common femoral artery. Proximal left femoral arteries are patent. Veins: Venous structures are unremarkable. Portal venous system is patent. Review of the MIP images confirms the above findings. NON-VASCULAR Lower chest: 3 mm nodule in the left lung base image 31/6 is probably calcified. Evidence for chronic changes at the lung bases. Hepatobiliary: Probable small calcified gallstones. No evidence for  gallbladder inflammatory changes. Mild intrahepatic biliary dilatation. Pancreas: Extensive calcifications throughout the pancreas, particularly the pancreatic head region. Main pancreatic duct appears to be diffusely enlarged. Focal low-density in the pancreatic head measures up to 1.2 cm and this could represent a pseudocyst formation but nonspecific. Spleen: Normal in size without focal abnormality. Adrenals/Urinary Tract: Normal adrenal glands. Negative for kidney stones or hydronephrosis. Normal urinary bladder. Stomach/Bowel: No evidence for acute GI bleeding. Again noted is abnormal configuration of the bowel with small bowel loops predominantly on the right side of the abdomen and the cecum is left of midline. 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 There is no evidence for bowel dilatation or focal inflammation. Lymphatic: No lymph node enlargement in the abdomen or pelvis. Reproductive: Prostate is unremarkable. Other: Diffuse stranding throughout the perinephric space and increased since 2011. There is no free air free fluid. Stranding in the perirectal fat. Mild subcutaneous edema. Musculoskeletal: Severe disc space loss at L5-S1 with retrolisthesis of L5 on S1. There is vertebral body height loss at L3 and L4 likely representing old compression fractures. Disc space narrowing at L1-L2. IMPRESSION: VASCULAR 1. Diffuse atherosclerotic disease throughout the abdomen and pelvis. Aortic Atherosclerosis (ICD10-I70.0). 2. 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 Occlusion or high-grade stenosis at the origin of the IMA. These vascular findings could be associated with chronic mesenteric ischemia in the appropriate clinical setting. 3. Severe bilateral renal artery stenosis. 4. Patent bilateral iliac artery stents. 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 5. Proximal femoral arteries are patent. Question a small intimal flap or  dissection involving the proximal left common femoral artery. NON-VASCULAR 1. No evidence for acute GI bleeding. 2. Intestinal malrotation. Findings are similar to the exam in 2011. No evidence for acute bowel inflammation or obstruction. 3. Diffuse calcifications throughout the pancreas compatible with prior pancreatitis. There is diffuse dilatation of the main pancreatic duct and cannot exclude a focal cystic structure in the pancreatic head measuring roughly 1.2 cm. Findings could be better characterized with MRI. 4. Nonspecific intra-abdominal and subcutaneous edema. 5. Probable cholelithiasis. 6. 3 mm nodule at the left lung base that may be calcified. No follow-up needed if patient is low-risk. Non-contrast chest CT can be considered in 12 months if patient is high-risk. This recommendation follows the consensus statement: Guidelines for Management of Incidental Pulmonary Nodules Detected on CT Images: From the Fleischner Society 2017; Radiology 2017; 284:228-243. Electronically Signed   By: Richarda Overlie M.D.   On: 10/17/2022 17:00   DG Chest Port 1 View  Result Date: 10/17/2022 CLINICAL DATA:  Blood in stool, questionable sepsis EXAM: PORTABLE CHEST 1 VIEW COMPARISON:  10/13/2009 FINDINGS: Cardiac and mediastinal contours are within normal limits given AP technique. No focal pulmonary opacity. No pleural effusion or pneumothorax. No acute osseous abnormality. IMPRESSION: No acute cardiopulmonary process. Electronically Signed   By: Wiliam Ke M.D.   On: 10/17/2022 12:55      LOS: 2 days    Rayna Sexton  Caleb Popp, MD [facility] 10/19/2022, 10:38 AM   If 7PM-7AM, please contact night-coverage [email or web address]

## 2022-10-19 NOTE — Hospital Course (Signed)
Todd [name] is a 72 y.o. male with a history of hypertension, hyperlipidemia, alcohol abuse, tobacco use, PAD. Patient presented secondary to frequent black stools concerning for upper GI bleeding. Protonix IV started and GI consulted with plan for upper and lower endoscopies.

## 2022-10-19 NOTE — Progress Notes (Signed)
Informed consent completed and placed in patient's chart. Patient denies any questions or concerns. No complaints or concerns verbalized from patient throughout shift/night. Pt. Maintained NPO after midnight per order. Pt.'s stools are a yellowish color, clear.   Around 6:00am: first tap water enema completed. Pt. Tolerated well. Will continue to monitor and report off to oncoming shift.

## 2022-10-19 NOTE — Progress Notes (Addendum)
Anesthesia service have informed me this afternoon that [name] will be canceled due to the lack of adequate anesthesia coverage.  I discussed this circumstance with the patient.  I have recommended he remain on clear liquids and go n.p.o. after midnight.  We will get to him as soon as feasible tomorrow.  He and his wife are understandable and agree.  Dr. Marletta Lor will be performing the procedures tomorrow.

## 2022-10-20 ENCOUNTER — Inpatient Hospital Stay (HOSPITAL_COMMUNITY): Payer: PPO | Admitting: Anesthesiology

## 2022-10-20 ENCOUNTER — Telehealth: Payer: Self-pay | Admitting: Gastroenterology

## 2022-10-20 ENCOUNTER — Encounter (HOSPITAL_COMMUNITY): Payer: Self-pay | Admitting: Internal Medicine

## 2022-10-20 ENCOUNTER — Encounter (HOSPITAL_COMMUNITY)
Admission: EM | Disposition: A | Discharge status: Home / Self Care | Payer: Self-pay | Source: Home / Self Care | Attending: Family Medicine

## 2022-10-20 DIAGNOSIS — K2970 Gastritis, unspecified, without bleeding: Secondary | ICD-10-CM

## 2022-10-20 DIAGNOSIS — K921 Melena: Secondary | ICD-10-CM | POA: Diagnosis not present

## 2022-10-20 DIAGNOSIS — D62 Acute posthemorrhagic anemia: Secondary | ICD-10-CM

## 2022-10-20 DIAGNOSIS — K259 Gastric ulcer, unspecified as acute or chronic, without hemorrhage or perforation: Secondary | ICD-10-CM

## 2022-10-20 DIAGNOSIS — K551 Chronic vascular disorders of intestine: Secondary | ICD-10-CM | POA: Insufficient documentation

## 2022-10-20 DIAGNOSIS — K254 Chronic or unspecified gastric ulcer with hemorrhage: Principal | ICD-10-CM

## 2022-10-20 DIAGNOSIS — F17210 Nicotine dependence, cigarettes, uncomplicated: Secondary | ICD-10-CM

## 2022-10-20 DIAGNOSIS — K2971 Gastritis, unspecified, with bleeding: Secondary | ICD-10-CM | POA: Diagnosis not present

## 2022-10-20 DIAGNOSIS — I4891 Unspecified atrial fibrillation: Secondary | ICD-10-CM | POA: Diagnosis not present

## 2022-10-20 DIAGNOSIS — I771 Stricture of artery: Secondary | ICD-10-CM | POA: Insufficient documentation

## 2022-10-20 HISTORY — PX: ESOPHAGOGASTRODUODENOSCOPY (EGD) WITH PROPOFOL: SHX5813

## 2022-10-20 HISTORY — PX: BIOPSY: SHX5522

## 2022-10-20 HISTORY — PX: COLONOSCOPY WITH PROPOFOL: SHX5780

## 2022-10-20 LAB — CBC
HCT: 28.8 % — ABNORMAL LOW (ref 39.0–52.0)
Hemoglobin: 9.3 g/dL — ABNORMAL LOW (ref 13.0–17.0)
MCH: 30.7 pg (ref 26.0–34.0)
MCHC: 32.3 g/dL (ref 30.0–36.0)
MCV: 95 fL (ref 80.0–100.0)
Platelets: 165 10*3/uL (ref 150–400)
RBC: 3.03 MIL/uL — ABNORMAL LOW (ref 4.22–5.81)
RDW: 14.4 % (ref 11.5–15.5)
WBC: 7.5 10*3/uL (ref 4.0–10.5)
nRBC: 0 % (ref 0.0–0.2)

## 2022-10-20 LAB — CULTURE, BLOOD (ROUTINE X 2)

## 2022-10-20 SURGERY — COLONOSCOPY WITH PROPOFOL
Anesthesia: General

## 2022-10-20 MED ORDER — LACTATED RINGERS IV SOLN: INTRAVENOUS | Status: DC

## 2022-10-20 MED ORDER — LIDOCAINE HCL (PF) 2 % IJ SOLN
INTRAMUSCULAR | Status: DC | PRN
  Administered 2022-10-20: 50 mg via INTRADERMAL

## 2022-10-20 MED ORDER — PROPOFOL 500 MG/50ML IV EMUL
INTRAVENOUS | Status: DC | PRN
  Administered 2022-10-20: 150 ug/kg/min via INTRAVENOUS

## 2022-10-20 MED ORDER — PANTOPRAZOLE SODIUM 40 MG PO TBEC: 40.0000 mg | DELAYED_RELEASE_TABLET | Freq: Every day | ORAL | 1 refills | Status: DC

## 2022-10-20 MED ORDER — PROPOFOL 10 MG/ML IV BOLUS
INTRAVENOUS | Status: DC | PRN
  Administered 2022-10-20: 60 mg via INTRAVENOUS

## 2022-10-20 NOTE — Discharge Summary (Signed)
Physician Discharge Summary   Patient: Todd [name] MRN: [id number] DOB: [birth year]  Admit date:     10/17/2022  Discharge date: 10/20/22  Discharge Physician: Jacquelin Hawking, MD   PCP: Elfredia Nevins, MD   Recommendations at discharge:  PCP follow-up GI follow-up Follow-up with vascular surgery regarding celiac artery, IMA and bilateral renal artery stenoses noted on imaging Follow-up biopsy results from upper endoscopy Close monitoring if starting ACEi or ARBs secondary to severe bilateral renal artery stenosis  Discharge Diagnoses: Principal Problem:   GI bleed Active Problems:   Essential hypertension   Generalized weakness   Hypoalbuminemia due to protein-calorie malnutrition   Leukocytosis   Lactic acidosis   Mixed hyperlipidemia  Resolved Problems:   * No resolved hospital problems. *  Hospital Course: Todd [name] is a 72 y.o. male with a history of hypertension, hyperlipidemia, alcohol abuse, tobacco use, PAD. Patient presented secondary to frequent black stools concerning for upper GI bleeding. Protonix IV started and GI consulted with plan for upper and lower endoscopies.  Assessment and Plan:  GI bleeding FOBT positive. Complicated by NSAID use and Plavix use. GI consulted with plan for upper and lower endoscopies today, 4/24. Bloody stools have cleared with bowel prep. Upper endoscopy on 4/25 revealed evidence of a single non-bleeding ulcer, in addition to gastritis; biopsy obtained. Colonoscopy with evidence of poor prep and stool throughout the colon. Recommendation from GI to restart Plavix and continue Protonix BID; avoid NSAIDs. Patient to follow-up with GI as an outpatient.   Chronic anemia Acute blood loss anemia Baseline hemoglobin is between 11-13. Hemoglobin of 10 on admission with downward drift. Hemoglobin of 8.9 today but appears stable. Acute anemia secondary to GI bleeding.   Generalized weakness Likely related to blood loss anemia.  Improved.   Hypokalemia Supplementation given. Resolved.   Hypomagnesemia Supplementation given. Resolved.   Lactic acidosis Lactic acid of 2.9 on admission. Resolved after fluids down to 1.5.   Leukocytosis Present on admission. Likely reactive. Resolved.   Moderate malnutrition Noted.   Hyponatremia Mild. Asymptomatic. Follow-up metabolic panel as an outpatient.   Thrombocytopenia Mild. Likely reactive.   Primary hypertension Patient is on amlodipine as an outpatient which was initially restarted but stopped secondary low-normal blood pressure. Patient did have mild hypotension in the ED prior to admission. Midodrine started briefly. Discontinue amlodipine on discharge.   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 Asymptomatic. Patient follows with vascular surgery as an outpatient.   Severe bilateral renal artery stenosis Noted on CT imaging. Patient follows with vascular surgery as an outpatient.   Bilateral carotid artery stenosis Patient is s/p left carotid endarterectomy in 01/2023. Patient follows with vascular surgery and is on Plavix. Plavix held secondary to GI bleeding.   Hyperlipidemia Continue Crestor   Consultants: Gastroenterology Procedures performed:  Upper endoscopy Colonoscopy  Disposition: Home Diet recommendation: Cardiac diet   DISCHARGE MEDICATION: Allergies as of 10/20/2022   No Known Allergies      Medication List     STOP taking these medications    amLODipine 5 MG tablet Commonly known as: NORVASC       TAKE these medications    aspirin EC 81 MG tablet Take 1 tablet (81 mg total) by mouth daily at 6 (six) AM. Swallow whole.   clopidogrel 75 MG tablet Commonly known as: PLAVIX TAKE ONE TABLET BY MOUTH ONCE DAILY.   pantoprazole 40 MG tablet Commonly known as: Protonix Take  1 tablet (40 mg total) by mouth daily.   rosuvastatin 10 MG tablet Commonly known as:  CRESTOR TAKE (1) TABLET BY MOUTH ONCE DAILY.        Discharge Exam: BP 106/63   Pulse 81   Temp 97.7 F (36.5 C)   Resp 19   Ht 5\' 8"  (1.727 m)   Wt 56.7 kg   SpO2 100%   BMI 19.01 kg/m   General exam: Appears calm and comfortable Respiratory system: Clear to auscultation. Respiratory effort normal. Cardiovascular system: S1 & S2 heard, RRR. No murmurs, rubs, gallops or clicks. Gastrointestinal system: Abdomen is nondistended, soft and nontender. Normal bowel sounds heard. Central nervous system: Alert and oriented. No focal neurological deficits. Musculoskeletal: No edema. No calf tenderness Skin: No cyanosis. No rashes Psychiatry: Judgement and insight appear normal. Mood & affect appropriate.   Condition at discharge: stable  The results of significant diagnostics from this hospitalization (including imaging, microbiology, ancillary and laboratory) are listed below for reference.   Imaging Studies: CT Angio Abd/Pel W and/or Wo Contrast  Result Date: 10/17/2022 CLINICAL DATA:  Blood in stool.  Low body temperature. EXAM: CTA ABDOMEN AND PELVIS WITHOUT AND WITH CONTRAST TECHNIQUE: Multidetector CT imaging of the abdomen and pelvis was performed using the standard protocol during bolus administration of intravenous contrast. Multiplanar reconstructed images and MIPs were obtained and reviewed to evaluate the vascular anatomy. RADIATION DOSE REDUCTION: This exam was performed according to the departmental dose-optimization program which includes automated exposure control, adjustment of the mA and/or kV according to patient size and/or use of iterative reconstruction technique. CONTRAST:  75mL OMNIPAQUE IOHEXOL 350 MG/ML SOLN COMPARISON:  CT abdomen and pelvis 10/08/2009 FINDINGS: VASCULAR Aorta: Atherosclerotic disease in the abdominal aorta without aneurysm, dissection or significant stenosis. Celiac: Calcified plaque at the origin of the celiac trunk with a moderate or severe  stenosis. Main branch vessels of the celiac trunk are patent. Limited evaluation of the pancreatic-duodenal branches due to the extensive pancreatic calcifications. No clear evidence for aneurysm formation. SMA: At least moderate stenosis at the origin of the SMA related to aortic plaque at the aorta. Main SMA branches are patent. Renals: High-grade stenosis in the proximal right renal artery within near occlusion. Severe high-grade stenosis in the proximal/mid left renal artery. No evidence for renal artery aneurysm. IMA: IMA is patent but there is either occlusion or high-grade stenosis at the origin. Inflow: Extensive plaque involving the bilateral iliac arteries. Mild narrowing in the proximal right common iliac artery. Widely patent stent involving the distal right common iliac artery extending into the distal right external iliac artery. Origin of the right internal iliac artery is occluded. Approximately 50% stenosis in the proximal left common iliac artery. Origin of the left internal iliac artery is occluded. Widely patent stent in the proximal left external iliac artery. Proximal Outflow: Proximal right femoral arteries are widely patent. Question a small intimal flap or dissection flap in the proximal left common femoral artery. Proximal left femoral arteries are patent. Veins: Venous structures are unremarkable. Portal venous system is patent. Review of the MIP images confirms the above findings. NON-VASCULAR Lower chest: 3 mm nodule in the left lung base image 31/6 is probably calcified. Evidence for chronic changes at the lung bases. Hepatobiliary: Probable small calcified gallstones. No evidence for gallbladder inflammatory changes. Mild intrahepatic biliary dilatation. Pancreas: Extensive calcifications throughout the pancreas, particularly the pancreatic head region. Main pancreatic duct appears to be diffusely enlarged. Focal low-density in the pancreatic head measures  up to 1.2 cm and this could  represent a pseudocyst formation but nonspecific. Spleen: Normal in size without focal abnormality. Adrenals/Urinary Tract: Normal adrenal glands. Negative for kidney stones or hydronephrosis. Normal urinary bladder. Stomach/Bowel: No evidence for acute GI bleeding. Again noted is abnormal configuration of the bowel with small bowel loops predominantly on the right side of the abdomen and the cecum is left of midline. 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 There is no evidence for bowel dilatation or focal inflammation. Lymphatic: No lymph node enlargement in the abdomen or pelvis. Reproductive: Prostate is unremarkable. Other: Diffuse stranding throughout the perinephric space and increased since 2011. There is no free air free fluid. Stranding in the perirectal fat. Mild subcutaneous edema. Musculoskeletal: Severe disc space loss at L5-S1 with retrolisthesis of L5 on S1. There is vertebral body height loss at L3 and L4 likely representing old compression fractures. Disc space narrowing at L1-L2. IMPRESSION: VASCULAR 1. Diffuse atherosclerotic disease throughout the abdomen and pelvis. Aortic Atherosclerosis (ICD10-I70.0). 2. 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 Occlusion or high-grade stenosis at the origin of the IMA. These vascular findings could be associated with chronic mesenteric ischemia in the appropriate clinical setting. 3. Severe bilateral renal artery stenosis. 4. Patent bilateral iliac artery stents. 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 5. Proximal femoral arteries are patent. Question a small intimal flap or dissection involving the proximal left common femoral artery. NON-VASCULAR 1. No evidence for acute GI bleeding. 2. Intestinal malrotation. Findings are similar to the exam in 2011. No evidence for acute bowel inflammation or obstruction. 3. Diffuse calcifications throughout the pancreas compatible with prior  pancreatitis. There is diffuse dilatation of the main pancreatic duct and cannot exclude a focal cystic structure in the pancreatic head measuring roughly 1.2 cm. Findings could be better characterized with MRI. 4. Nonspecific intra-abdominal and subcutaneous edema. 5. Probable cholelithiasis. 6. 3 mm nodule at the left lung base that may be calcified. No follow-up needed if patient is low-risk. Non-contrast chest CT can be considered in 12 months if patient is high-risk. This recommendation follows the consensus statement: Guidelines for Management of Incidental Pulmonary Nodules Detected on CT Images: From the Fleischner Society 2017; Radiology 2017; 284:228-243. Electronically Signed   By: Richarda Overlie M.D.   On: 10/17/2022 17:00   DG Chest Port 1 View  Result Date: 10/17/2022 CLINICAL DATA:  Blood in stool, questionable sepsis EXAM: PORTABLE CHEST 1 VIEW COMPARISON:  10/13/2009 FINDINGS: Cardiac and mediastinal contours are within normal limits given AP technique. No focal pulmonary opacity. No pleural effusion or pneumothorax. No acute osseous abnormality. IMPRESSION: No acute cardiopulmonary process. Electronically Signed   By: Wiliam Ke M.D.   On: 10/17/2022 12:55    Microbiology: Results for orders placed or performed during the hospital encounter of 10/17/22  Blood Culture (routine x 2)     Status: None (Preliminary result)   Collection Time: 10/17/22 12:56 PM   Specimen: BLOOD RIGHT FOREARM  Result Value Ref Range Status   Specimen Description   Final    BLOOD RIGHT FOREARM BOTTLES DRAWN AEROBIC AND ANAEROBIC   Special Requests Blood Culture adequate volume  Final   Culture   Final    NO GROWTH 3 DAYS Performed at [facility], [address] [place], [place] [address]    Report Status PENDING  Incomplete  Blood Culture (routine x 2)     Status: None (Preliminary result)  Collection Time: 10/17/22  1:06 PM   Specimen: Right Antecubital; Blood  Result Value Ref Range Status    Specimen Description   Final    RIGHT ANTECUBITAL BOTTLES DRAWN AEROBIC AND ANAEROBIC   Special Requests   Final    Blood Culture results may not be optimal due to an excessive volume of blood received in culture bottles   Culture   Final    NO GROWTH 3 DAYS Performed at [place] [facility], [address] [place], [place] [address]    Report Status PENDING  Incomplete  Resp panel by RT-PCR (RSV, Flu A&B, Covid) Anterior Nasal Swab     Status: None   Collection Time: 10/17/22  5:54 PM   Specimen: Anterior Nasal Swab  Result Value Ref Range Status   SARS Coronavirus 2 by RT PCR NEGATIVE NEGATIVE Final    Comment: (NOTE) SARS-CoV-2 target nucleic acids are NOT DETECTED.  The SARS-CoV-2 RNA is generally detectable in upper respiratory specimens during the acute phase of infection. The lowest concentration of SARS-CoV-2 viral copies this assay can detect is 138 copies/mL. A negative result does not preclude SARS-Cov-2 infection and should not be used as the sole basis for treatment or other patient management decisions. A negative result may occur with  improper specimen collection/handling, submission of specimen other than nasopharyngeal swab, presence of viral mutation(s) within the areas targeted by this assay, and inadequate number of viral copies(<138 copies/mL). A negative result must be combined with clinical observations, patient history, and epidemiological information. The expected result is Negative.  Fact Sheet for Patients:  BloggerCourse.com  Fact Sheet for Healthcare Providers:  SeriousBroker.it  This test is no t yet approved or cleared by the [place] FDA and  has been authorized for detection and/or diagnosis of SARS-CoV-2 by FDA under an Emergency Use Authorization (EUA). This EUA will remain  in effect (meaning this test can be used) for the duration of the COVID-19 declaration under Section 564(b)(1) of the Act,  21 U.S.C.section 360bbb-3(b)(1), unless the authorization is terminated  or revoked sooner.       Influenza A by PCR NEGATIVE NEGATIVE Final   Influenza B by PCR NEGATIVE NEGATIVE Final    Comment: (NOTE) The Xpert Xpress SARS-CoV-2/FLU/RSV plus assay is intended as an aid in the diagnosis of influenza from Nasopharyngeal swab specimens and should not be used as a sole basis for treatment. Nasal washings and aspirates are unacceptable for Xpert Xpress SARS-CoV-2/FLU/RSV testing.  Fact Sheet for Patients: BloggerCourse.com  Fact Sheet for Healthcare Providers: SeriousBroker.it  This test is not yet approved or cleared by the [place] FDA and has been authorized for detection and/or diagnosis of SARS-CoV-2 by FDA under an Emergency Use Authorization (EUA). This EUA will remain in effect (meaning this test can be used) for the duration of the COVID-19 declaration under Section 564(b)(1) of the Act, 21 U.S.C. section 360bbb-3(b)(1), unless the authorization is terminated or revoked.     Resp Syncytial Virus by PCR NEGATIVE NEGATIVE Final    Comment: (NOTE) Fact Sheet for Patients: BloggerCourse.com  Fact Sheet for Healthcare Providers: SeriousBroker.it  This test is not yet approved or cleared by the [place] FDA and has been authorized for detection and/or diagnosis of SARS-CoV-2 by FDA under an Emergency Use Authorization (EUA). This EUA will remain in effect (meaning this test can be used) for the duration of the COVID-19 declaration under Section 564(b)(1) of the Act, 21 U.S.C. section 360bbb-3(b)(1), unless the authorization is terminated or  revoked.  Performed at [facility], [address] [place], [place] [address]     Labs: CBC: Recent Labs  Lab 10/17/22 1202 10/18/22 0420 10/18/22 2028 10/19/22 0404 10/20/22 0417  WBC 15.2* 9.0  --  7.7 7.5   NEUTROABS 12.7*  --   --   --   --   HGB 10.0* 9.1* 9.9* 8.9* 9.3*  HCT 31.2* 27.6* 30.6* 27.7* 28.8*  MCV 97.2 93.6  --  96.5 95.0  PLT 215 148*  --  138* 165   Basic Metabolic Panel: Recent Labs  Lab 10/17/22 1202 10/18/22 0420 10/19/22 0404  NA 139 137 133*  K 4.1 3.4* 3.6  CL 106 109 104  CO2 22 22 23   GLUCOSE 156* 82 80  BUN 22 16 9   CREATININE 1.01 0.87 0.76  CALCIUM 7.8* 7.2* 7.5*  MG 1.7 1.5* 1.8  PHOS  --  2.2*  --    Liver Function Tests: Recent Labs  Lab 10/17/22 1202 10/18/22 0420  AST 20 16  ALT 10 6  ALKPHOS 65 52  BILITOT 0.5 0.4  PROT 5.8* 4.7*  ALBUMIN 2.5* 2.1*    Discharge time spent: 35 minutes.  Signed: Jacquelin Hawking, MD [facility] 10/20/2022

## 2022-10-20 NOTE — Telephone Encounter (Signed)
Copied and pasted from secure chat by Dr. Marletta Lor:  Just finished his upper endoscopy and colonoscopy. EGD showed pyloric ulcer, clean-based, no intervention needed. I took samples to rule out H. pylori which I will follow-up on. Otherwise unremarkable.  Colonoscopy poor prep. Nothing big/obvious like cancer. No active or stigmata of bleeding. Will need to repeat this in the outpatient setting.  Continue on p.o. PPI 40 mg twice daily. Avoid all NSAIDs. We will arrange GI follow-up. Okay to discharge later today from GI standpoint.   PLEASE ARRANGE FOR HOSPITAL FOLLOW UP IN 2 WEEKS WITH DR CARVER OR KRISTEN WHO BOTH HAVE SEEN THE PATIENT.

## 2022-10-20 NOTE — Anesthesia Postprocedure Evaluation (Signed)
Anesthesia Post Note  Patient: Todd [name]  Procedure(s) Performed: COLONOSCOPY WITH PROPOFOL ESOPHAGOGASTRODUODENOSCOPY (EGD) WITH PROPOFOL BIOPSY  Patient location during evaluation: Phase II Anesthesia Type: General Level of consciousness: awake and alert and oriented Pain management: pain level controlled Vital Signs Assessment: post-procedure vital signs reviewed and stable Respiratory status: spontaneous breathing, nonlabored ventilation and respiratory function stable Cardiovascular status: blood pressure returned to baseline and stable Postop Assessment: no apparent nausea or vomiting Anesthetic complications: no  No notable events documented.   Last Vitals:  Vitals:   10/20/22 1215 10/20/22 1340  BP: 106/63 121/82  Pulse:  73  Resp: 19 18  Temp: 36.5 C 36.6 C  SpO2:  99%    Last Pain:  Vitals:   10/20/22 1340  TempSrc: Oral  PainSc:                  Stepheny Canal C Author [place]

## 2022-10-20 NOTE — Interval H&P Note (Signed)
History and Physical Interval Note:  10/20/2022 11:20 AM  Todd [name]  has presented today for surgery, with the diagnosis of GI bleed, burgandy colored stool, symptomatic anemia, history of NSAID use.  The various methods of treatment have been discussed with the patient and family. After consideration of risks, benefits and other options for treatment, the patient has consented to  Procedure(s): COLONOSCOPY WITH PROPOFOL (N/A) ESOPHAGOGASTRODUODENOSCOPY (EGD) WITH PROPOFOL (N/A) as a surgical intervention.  The patient's history has been reviewed, patient examined, no change in status, stable for surgery.  I have reviewed the patient's chart and labs.  Questions were answered to the patient's satisfaction.     Lanelle Bal

## 2022-10-20 NOTE — Op Note (Signed)
[facility] Patient Name: Todd [name] Procedure Date: 10/20/2022 11:43 AM MRN: [id number] Date of Birth: 1950/07/03 Attending MD: Hennie Duos. Marletta Lor , [place], [id number] CSN: [id number] Age: 72 Admit Type: Inpatient Procedure:                Colonoscopy Indications:              Hematochezia, Melena, Acute post hemorrhagic anemia Providers:                Hennie Duos. Marletta Lor, DO, Buel Ream. Museum/gallery exhibitions officer, Charity fundraiser,                            Judeth Cornfield. Jessee Avers, Technician Referring MD:              Medicines:                See the Anesthesia note for documentation of the                            administered medications Complications:            No immediate complications. Estimated Blood Loss:     Estimated blood loss: none. Procedure:                Pre-Anesthesia Assessment:                           - The anesthesia plan was to use monitored                            anesthesia care (MAC).                           After obtaining informed consent, the colonoscope                            was passed under direct vision. Throughout the                            procedure, the patient's blood pressure, pulse, and                            oxygen saturations were monitored continuously. The                            [id number]-HQ190L ([id number]) scope was introduced through                            the anus and advanced to the the cecum, identified                            by appendiceal orifice and ileocecal valve. The                            colonoscopy was performed without difficulty. The                            patient  tolerated the procedure well. The quality                            of the bowel preparation was evaluated using the                            BBPS [facility] Bowel Preparation Scale) with scores                            of: Right Colon = 1 (portion of mucosa seen, but                            other areas not well seen due to staining, residual                             stool and/or opaque liquid), Transverse Colon = 1                            (portion of mucosa seen, but other areas not well                            seen due to staining, residual stool and/or opaque                            liquid) and Left Colon = 1 (portion of mucosa seen,                            but other areas not well seen due to staining,                            residual stool and/or opaque liquid). The total                            BBPS score equals 3. The quality of the bowel                            preparation was inadequate. Scope In: 11:44:52 AM Scope Out: 11:56:18 AM Scope Withdrawal Time: 0 hours 7 minutes 31 seconds  Total Procedure Duration: 0 hours 11 minutes 26 seconds  Findings:      Extensive amounts of semi-liquid stool was found in the entire colon,       precluding visualization. Lavage of the area was performed using copious       amounts of sterile water, resulting in incomplete clearance with       continued poor visualization. No obvious large masses/polyps. No active       or stigmata of bleeding. Impression:               - Preparation of the colon was inadequate.                           - Stool in the entire examined colon.                           -  No specimens collected. Moderate Sedation:      Per Anesthesia Care Recommendation:           - Return patient to hospital ward for ongoing care.                           - Soft diet.                           - Patient likely bled from gastric ulcer (see EGD                            report). Soft diet. Continue on PPI 40 mg BID.                            AVOID all NSAIDs. We will arrange GI follow up                            visit. Will need repeat colonoscopy in outpatient                            setting. Okay to resume Plavix. Okay to DC from GI                            standpoint. Procedure Code(s):        --- Professional ---                            [phone], Colonoscopy, flexible; diagnostic, including                            collection of specimen(s) by brushing or washing,                            when performed (separate procedure) Diagnosis Code(s):        --- Professional ---                           K92.1, Melena (includes Hematochezia)                           D62, Acute posthemorrhagic anemia CPT copyright 2022 American Medical Association. All rights reserved. The codes documented in this report are preliminary and upon coder review may  be revised to meet current compliance requirements. Hennie Duos. Marletta Lor, DO Hennie Duos. Marletta Lor, DO 10/20/2022 12:02:56 PM This report has been signed electronically. Number of Addenda: 0

## 2022-10-20 NOTE — Discharge Instructions (Signed)
Todd [name],  You were in the hospital with bleeding in your intestinal system. This was likely secondary to an ulcer found in your stomach. Please do not use any NSAID medications (Advil, Ibuprofen, Aleve, Naproxen, Motrin, etc). Please follow-up with your PCP and the GI physician.

## 2022-10-20 NOTE — Op Note (Signed)
[facility] Patient Name: Todd [name] Procedure Date: 10/20/2022 11:17 AM MRN: [id number] Date of Birth: 1951-03-18 Attending MD: Hennie Duos. Marletta Lor , [place], [id number] CSN: [id number] Age: 72 Admit Type: Inpatient Procedure:                Upper GI endoscopy Indications:              Acute post hemorrhagic anemia, Gastrointestinal                            bleeding of unknown origin Providers:                Hennie Duos. Marletta Lor, DO, Buel Ream. Museum/gallery exhibitions officer, Charity fundraiser,                            Judeth Cornfield. Jessee Avers, Technician Referring MD:              Medicines:                See the Anesthesia note for documentation of the                            administered medications Complications:            No immediate complications. Estimated Blood Loss:     Estimated blood loss was minimal. Procedure:                Pre-Anesthesia Assessment:                           - The anesthesia plan was to use monitored                            anesthesia care (MAC).                           After obtaining informed consent, the endoscope was                            passed under direct vision. Throughout the                            procedure, the patient's blood pressure, pulse, and                            oxygen saturations were monitored continuously. The                            GIF-H190 ([id number]) scope was introduced through the                            mouth, and advanced to the second part of duodenum.                            The upper GI endoscopy was accomplished without  difficulty. The patient tolerated the procedure                            well. Scope In: 11:35:52 AM Scope Out: 11:40:42 AM Total Procedure Duration: 0 hours 4 minutes 50 seconds  Findings:      The Z-line was regular.      There is no endoscopic evidence of bleeding, areas of erosion,       esophagitis, ulcerations or varices in the entire esophagus.      Diffuse  moderate inflammation characterized by congestion (edema) and       erythema was found in the entire examined stomach. Biopsies were taken       with a cold forceps for Helicobacter pylori testing.      One non-bleeding cratered gastric ulcer with a clean ulcer base (Forrest       Class III) was found at the pylorus. The lesion was 7 mm in largest       dimension.      The duodenal bulb, first portion of the duodenum and second portion of       the duodenum were normal. Impression:               - Z-line regular.                           - Gastritis. Biopsied.                           - Non-bleeding gastric ulcer with a clean ulcer                            base (Forrest Class III).                           - Normal duodenal bulb, first portion of the                            duodenum and second portion of the duodenum. Moderate Sedation:      Per Anesthesia Care Recommendation:           - See colonoscopy report for further recommendations Procedure Code(s):        --- Professional ---                           [phone], Esophagogastroduodenoscopy, flexible,                            transoral; with biopsy, single or multiple Diagnosis Code(s):        --- Professional ---                           K29.70, Gastritis, unspecified, without bleeding                           K25.9, Gastric ulcer, unspecified as acute or                            chronic, without hemorrhage or perforation  D62, Acute posthemorrhagic anemia                           K92.2, Gastrointestinal hemorrhage, unspecified CPT copyright 2022 American Medical Association. All rights reserved. The codes documented in this report are preliminary and upon coder review may  be revised to meet current compliance requirements. Hennie Duos. Marletta Lor, DO Hennie Duos. Marletta Lor, DO 10/20/2022 11:43:39 AM This report has been signed electronically. Number of Addenda: 0

## 2022-10-20 NOTE — Transfer of Care (Signed)
Immediate Anesthesia Transfer of Care Note  Patient: Todd [name]  Procedure(s) Performed: COLONOSCOPY WITH PROPOFOL ESOPHAGOGASTRODUODENOSCOPY (EGD) WITH PROPOFOL BIOPSY  Patient Location: PACU  Anesthesia Type:General  Level of Consciousness: drowsy  Airway & Oxygen Therapy: Patient Spontanous Breathing  Post-op Assessment: Report given to RN and Post -op Vital signs reviewed and stable  Post vital signs: Reviewed and stable  Last Vitals:  Vitals Value Taken Time  BP 100/64   Temp    Pulse 76   Resp 18   SpO2 98%     Last Pain:  Vitals:   10/20/22 1023  TempSrc:   PainSc: 0-No pain         Complications: No notable events documented.

## 2022-10-21 LAB — SURGICAL PATHOLOGY

## 2022-10-21 LAB — CULTURE, BLOOD (ROUTINE X 2)

## 2022-10-22 LAB — CULTURE, BLOOD (ROUTINE X 2): Culture: NO GROWTH

## 2022-10-24 DIAGNOSIS — I739 Peripheral vascular disease, unspecified: Secondary | ICD-10-CM | POA: Diagnosis not present

## 2022-10-24 DIAGNOSIS — D649 Anemia, unspecified: Secondary | ICD-10-CM | POA: Diagnosis not present

## 2022-10-24 DIAGNOSIS — K922 Gastrointestinal hemorrhage, unspecified: Secondary | ICD-10-CM | POA: Diagnosis not present

## 2022-10-24 DIAGNOSIS — I4891 Unspecified atrial fibrillation: Secondary | ICD-10-CM | POA: Diagnosis not present

## 2022-10-24 DIAGNOSIS — I10 Essential (primary) hypertension: Secondary | ICD-10-CM | POA: Diagnosis not present

## 2022-10-24 DIAGNOSIS — R0989 Other specified symptoms and signs involving the circulatory and respiratory systems: Secondary | ICD-10-CM | POA: Diagnosis not present

## 2022-10-24 DIAGNOSIS — Z9229 Personal history of other drug therapy: Secondary | ICD-10-CM | POA: Diagnosis not present

## 2022-10-24 DIAGNOSIS — Z681 Body mass index (BMI) 19 or less, adult: Secondary | ICD-10-CM | POA: Diagnosis not present

## 2022-10-25 ENCOUNTER — Encounter (HOSPITAL_COMMUNITY): Payer: Self-pay | Admitting: Internal Medicine

## 2022-10-26 ENCOUNTER — Ambulatory Visit (INDEPENDENT_AMBULATORY_CARE_PROVIDER_SITE_OTHER): Payer: PPO | Admitting: Vascular Surgery

## 2022-10-26 ENCOUNTER — Encounter: Payer: Self-pay | Admitting: Vascular Surgery

## 2022-10-26 VITALS — BP 94/63 | HR 69 | Temp 97.4°F | Ht 68.0 in | Wt 120.0 lb

## 2022-10-26 DIAGNOSIS — Z95828 Presence of other vascular implants and grafts: Secondary | ICD-10-CM

## 2022-10-26 DIAGNOSIS — I771 Stricture of artery: Secondary | ICD-10-CM | POA: Diagnosis not present

## 2022-10-26 DIAGNOSIS — K551 Chronic vascular disorders of intestine: Secondary | ICD-10-CM

## 2022-10-26 NOTE — Progress Notes (Signed)
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 male here today for evaluation with his wife.  He is very well-known to our practice.  He has history of prior femoral-popliteal bypass on the right with Gore-Tex in September 2022.  He underwent left carotid endarterectomy for severe asymptomatic disease in August 2023.  He recently was admitted with lower GI bleed.  Workup included upper and lower endoscopy.  Lower endoscopy was nondiagnostic due to poor prep.  Upper endoscopy revealed a gastric ulcer and duodenum no evidence of ischemia.  He has been thin for years and does not have any recent weight loss.  He specifically denies any postprandial pain or food fear.  Past Medical History:  Diagnosis Date   Alcoholism [facility])    History of withdrawal and seizures   Atrial fibrillation (HCC)    Taken off of Coumadin in 2009 in the setting of GI bleed   Chronic back pain    Colitis    4/11   Essential hypertension    Gout    Heart murmur    History of GI bleed    Internal hemorrhoids    Colonoscopy 11/09   Osteoarthritis    Peripheral vascular disease (HCC)    Schatzki's ring    EGD 11/09    Family History  Problem Relation Age of Onset   Coronary artery disease Father    Coronary artery disease Sister        MI at age 66    SOCIAL HISTORY: Social History   Tobacco Use   Smoking status: Every Day    Packs/day: .5    Types: Cigarettes    Passive exposure: Current   Smokeless tobacco: Never   Tobacco comments:    0.25-0.5 packs per day  Substance Use Topics   Alcohol use: Yes    Alcohol/week: 5.0 standard drinks of alcohol    Types: 5 Cans of beer per week    Comment: less than a 6 pack of beer per week    No Known Allergies  Current Outpatient  Medications  Medication Sig Dispense Refill   aspirin EC 81 MG EC tablet Take 1 tablet (81 mg total) by mouth daily at 6 (six) AM. Swallow whole. 30 tablet 11   clopidogrel (PLAVIX) 75 MG tablet TAKE ONE TABLET BY MOUTH ONCE DAILY. 90 tablet 1   furosemide (LASIX) 20 MG tablet Take 20 mg by mouth daily as needed.     pantoprazole (PROTONIX) 40 MG tablet Take 1 tablet (40 mg total) by mouth daily. 30 tablet 1   rosuvastatin (CRESTOR) 10 MG tablet TAKE (1) TABLET BY MOUTH ONCE DAILY. 30 tablet 0   No current facility-administered medications for this visit.    REVIEW OF SYSTEMS:  [X]  denotes positive finding, [ ]  denotes negative finding Cardiac  Comments:  Chest pain or chest pressure:    Shortness of breath upon exertion:    Short of breath when lying flat:    Irregular heart rhythm:        Vascular    Pain in calf, thigh, or hip brought on by ambulation:    Pain in feet at night that wakes you up from your sleep:     Blood clot in your veins:    Leg swelling:  PHYSICAL EXAM: Vitals:   10/26/22 0842  BP: 94/63  Pulse: 69  Temp: (!) 97.4 F (36.3 C)  SpO2: 99%  Weight: 120 lb (54.4 kg)  Height: 5\' 8"  (1.727 m)    GENERAL: The patient is a well-nourished male, in no acute distress. The vital signs are documented above. CARDIOVASCULAR: Carotid arteries without bruits bilaterally.  Well-healed left flank incision.  Abdomen with no evidence of abdominal bruit. PULMONARY: There is good air exchange  MUSCULOSKELETAL: There are no major deformities or cyanosis. NEUROLOGIC: No focal weakness or paresthesias are detected. SKIN: There are no ulcers or rashes noted. PSYCHIATRIC: The patient has a normal affect.  DATA:  I reviewed his CT scan from 10/17/2022.  This reveals extensive atherotic changes of all abdominal vessels.  He has high-grade stenosis of both renal arteries.  Also has moderately severe stenosis of the celiac, SMA and inferior mesenteric arteries.  He has  occlusion of both internal iliac arteries.  He does have patent bilateral common and external iliac stents.  MEDICAL ISSUES: Diffuse peripheral vascular occlusive disease.  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  He does not have any symptoms consistent with mesenteric ischemia and appears to be well compensated with this.  He has a clear diagnosis to explain his GI bleed.  He is being treated appropriately for this.  I would not recommend any evaluation further with arteriography unless he develops symptoms of mesenteric ischemia.  We will see him on a as needed basis    Larina Earthly, MD FACS [facility] [place] [facility] Tel [phone]  Note: Portions of this report may have been transcribed using voice recognition software.  Every effort has been made to ensure accuracy; however, inadvertent computerized transcription errors may still be present.

## 2022-11-01 DIAGNOSIS — D649 Anemia, unspecified: Secondary | ICD-10-CM | POA: Diagnosis not present

## 2022-11-03 ENCOUNTER — Encounter: Payer: Self-pay | Admitting: Internal Medicine

## 2022-11-03 ENCOUNTER — Ambulatory Visit (INDEPENDENT_AMBULATORY_CARE_PROVIDER_SITE_OTHER): Payer: PPO | Admitting: Internal Medicine

## 2022-11-03 VITALS — BP 109/72 | HR 91 | Temp 97.5°F | Ht 68.0 in | Wt 115.8 lb

## 2022-11-03 DIAGNOSIS — Z1211 Encounter for screening for malignant neoplasm of colon: Secondary | ICD-10-CM

## 2022-11-03 DIAGNOSIS — K8689 Other specified diseases of pancreas: Secondary | ICD-10-CM

## 2022-11-03 DIAGNOSIS — K274 Chronic or unspecified peptic ulcer, site unspecified, with hemorrhage: Secondary | ICD-10-CM

## 2022-11-03 DIAGNOSIS — R7989 Other specified abnormal findings of blood chemistry: Secondary | ICD-10-CM

## 2022-11-03 MED ORDER — PANTOPRAZOLE SODIUM 40 MG PO TBEC: 40.0000 mg | DELAYED_RELEASE_TABLET | Freq: Two times a day (BID) | ORAL | 11 refills | Status: DC

## 2022-11-03 NOTE — Progress Notes (Signed)
Referring Provider: Elfredia Nevins, MD Primary Care Physician:  Elfredia Nevins, MD Primary GI:  Dr. Marletta Lor  Chief Complaint  Patient presents with   Follow-up    Follow up after procedure. Poor prep    HPI:   Todd [name] is a 72 y.o. male who presents to clinic today for hospital follow-up visit.  Recent admission to [facility] for acute GI bleeding and acute blood loss anemia.  Upper endoscopy showed a pyloric ulcer, clean-based, did not require intervention.  Biopsies negative for H. pylori.  Ulcer likely NSAID use as patient notes using headache powders frequently.  Previously multiple times a day, now using once a day.  Discharged on pantoprazole daily.  Recent hemoglobin check of 10 which is improved.    Underwent colonoscopy during hospitalization unfortunately was poor prep.  Last colonoscopy 2009.  No family history of colorectal malignancy.  Patient states he is been doing well since getting out of the hospital.  No melena hematochezia.  Has recent blood work on his phone which showed a Laveda Norman sat of 82%.  He had a ferritin of over thousand in 2011 though I do not see a repeat.  CT angio abdomen pelvis showed stenoses involving celiac trunk, SMA, IMA.  Patient seen by vascular who recommended continue monitoring given lack of symptoms related to chronic mesenteric ischemia.  CT also showed diffuse calcifications throughout the pancreas as well as dilatation of the main pancreatic duct possible cystic structure in the pancreatic head.  Patient notes significant history of alcohol abuse years ago.  Now drinks a 1-2 beers each Sunday during the [employer] race.   Past Medical History:  Diagnosis Date   Alcoholism [facility])    History of withdrawal and seizures   Atrial fibrillation (HCC)    Taken off of Coumadin in 2009 in the setting of GI bleed   Chronic back pain    Colitis    4/11   Essential hypertension    Gout    Heart murmur    History of GI bleed     Internal hemorrhoids    Colonoscopy 11/09   Osteoarthritis    Peripheral vascular disease (HCC)    Schatzki's ring    EGD 11/09    Past Surgical History:  Procedure Laterality Date   ABDOMINAL AORTOGRAM N/A 03/08/2019   Procedure: ABDOMINAL AORTOGRAM;  Surgeon: Sherren Kerns, MD;  Location: [facility] INV[facility];  Service: Cardiovascular;  Laterality: N/A;   ABDOMINAL AORTOGRAM W/LOWER EXTREMITY N/A 03/02/2021   Procedure: ABDOMINAL AORTOGRAM W/LOWER EXTREMITY;  Surgeon: Nada Libman, MD;  Location: [facility];  Service: Cardiovascular;  Laterality: N/A;   APPLICATION OF WOUND VAC Right 07/22/2021   Procedure: APPLICATION OF WOUND VAC;  Surgeon: Nada Libman, MD;  Location: [facility] OR;  Service: Vascular;  Laterality: Right;   BIOPSY  10/20/2022   Procedure: BIOPSY;  Surgeon: Lanelle Bal, DO;  Location: [facility];  Service: Endoscopy;;   COLONOSCOPY WITH PROPOFOL N/A 10/20/2022   Procedure: COLONOSCOPY WITH PROPOFOL;  Surgeon: Lanelle Bal, DO;  [facility]: [facility];  Service: Endoscopy;  Laterality: N/A;   ENDARTERECTOMY Left 02/02/2022   Procedure: LEFT ENDARTERECTOMY CAROTID;  Surgeon: Nada Libman, MD;  Location: [facility] OR;  Service: Vascular;  Laterality: Left;   ESOPHAGOGASTRODUODENOSCOPY (EGD) WITH PROPOFOL N/A 10/20/2022   Procedure: ESOPHAGOGASTRODUODENOSCOPY (EGD) WITH PROPOFOL;  Surgeon: Lanelle Bal, DO;  [facility];  Service: Endoscopy;  Laterality: N/A;  Excision of gouty lesion     Left index finger   FEMORAL-POPLITEAL BYPASS GRAFT Right 03/12/2021   Procedure: RIGHT FEMORAL TO POPLITEAL ARTERY BYPASS GRAFTING USING THE PROPATEN GRAFT;  Surgeon: Nada Libman, MD;  Location: [facility] OR;  Service: Vascular;  Laterality: Right;   GROIN DEBRIDEMENT Right 07/22/2021   Procedure: RIGHT GROIN DEBRIDEMENT;  Surgeon: Nada Libman, MD;  Location: [facility] OR;  Service: Vascular;  Laterality: Right;   INSERTION OF ILIAC STENT Right 03/12/2021    Procedure: INSERTION OF  EXTERNAL ILIAC STENT;  Surgeon: Nada Libman, MD;  Location: [facility];  Service: Vascular;  Laterality: Right;   LOWER EXTREMITY ANGIOGRAM Right 03/12/2021   Procedure: LOWER EXTREMITY ANGIOGRAM;  Surgeon: Nada Libman, MD;  Location: [facility] OR;  Service: Vascular;  Laterality: Right;   LOWER EXTREMITY ANGIOGRAPHY Bilateral 03/08/2019   Procedure: Lower Extremity Angiography;  Surgeon: Sherren Kerns, MD;  Location: [facility] INV[facility] CV LAB;  Service: Cardiovascular;  Laterality: Bilateral;   LOWER EXTREMITY ANGIOGRAPHY N/A 03/15/2021   Procedure: LOWER EXTREMITY ANGIOGRAPHY;  Surgeon: Maeola Harman, MD;  Location: [facility] INVASIVE CV LAB;  Service: Cardiovascular;  Laterality: N/A;   MASS EXCISION Right 07/07/2020   Procedure: EXCISION OF RIGHT FACIAL MASS;  Surgeon: Newman Pies, MD;  Location: [facility];  Service: ENT;  Laterality: Right;   PATCH ANGIOPLASTY Left 02/02/2022   Procedure: PATCH ANGIOPLASTY OF LEFT CAROTID USING XENOSURE BOVINE PERICARDIUM PATCH;  Surgeon: Nada Libman, MD;  [facility];  Service: Vascular;  Laterality: Left;   PERIPHERAL VASCULAR BALLOON ANGIOPLASTY Right 03/02/2021   Procedure: PERIPHERAL VASCULAR BALLOON ANGIOPLASTY;  Surgeon: Nada Libman, MD;  [facility];  Service: Cardiovascular;  Laterality: Right;  external iliac   PERIPHERAL VASCULAR INTERVENTION Bilateral 03/08/2019   Procedure: PERIPHERAL VASCULAR INTERVENTION;  Surgeon: Sherren Kerns, MD;  [facility];  Service: Cardiovascular;  Laterality: Bilateral;  ext iliac artery stent   PERIPHERAL VASCULAR INTERVENTION Left 03/02/2021   Procedure: PERIPHERAL VASCULAR INTERVENTION;  Surgeon: Nada Libman, MD;  [facility];  Service: Cardiovascular;  Laterality: Left;  external iliac   PERIPHERAL VASCULAR INTERVENTION Right 03/15/2021   Procedure: PERIPHERAL VASCULAR INTERVENTION;  Surgeon: Maeola Harman, MD;  Location: [facility] INVASIVE CV LAB;  Service: Cardiovascular;  Laterality: Right;  external iliac   Right knee arthroscopy     SPINE SURGERY      Current Outpatient Medications  Medication Sig Dispense Refill   aspirin EC 81 MG EC tablet Take 1 tablet (81 mg total) by mouth daily at 6 (six) AM. Swallow whole. 30 tablet 11   clopidogrel (PLAVIX) 75 MG tablet TAKE ONE TABLET BY MOUTH ONCE DAILY. 90 tablet 1   furosemide (LASIX) 20 MG tablet Take 20 mg by mouth daily as needed.     rosuvastatin (CRESTOR) 10 MG tablet TAKE (1) TABLET BY MOUTH ONCE DAILY. 30 tablet 0   pantoprazole (PROTONIX) 40 MG tablet Take 1 tablet (40 mg total) by mouth 2 (two) times daily. 60 tablet 11   No current facility-administered medications for this visit.    Allergies as of 11/03/2022   (No Known Allergies)    Family History  Problem Relation Age of Onset   Coronary artery disease Father    Coronary artery disease Sister        MI at age 33    Social History   Socioeconomic History   Marital status:  Married    Spouse name: Not on file   Number of children: 3   Years of education: Not on file   Highest education level: Not on file  Occupational History   Occupation: Retired    Associate Professor: [employer]  Tobacco Use   Smoking status: Every Day    Packs/day: .5    Types: Cigarettes    Passive exposure: Current   Smokeless tobacco: Never   Tobacco comments:    0.25-0.5 packs per day  Vaping Use   Vaping Use: Never used  Substance and Sexual Activity   Alcohol use: Yes    Alcohol/week: 5.0 standard drinks of alcohol    Types: 5 Cans of beer per week    Comment: less than a 6 pack of beer per week   Drug use: No   Sexual activity: Never  Other Topics Concern   Not on file  Social History Narrative   Not on file   Social Determinants of Health   Financial Resource Strain: Not on file  Food Insecurity: No Food Insecurity (10/17/2022)   Hunger Vital Sign    Worried  About Running Out of Food in the Last Year: Never true    Ran Out of Food in the Last Year: Never true  Transportation Needs: No Transportation Needs (10/17/2022)   PRAPARE - Administrator, Civil Service (Medical): No    Lack of Transportation (Non-Medical): No  Physical Activity: Not on file  Stress: Not on file  Social Connections: Not on file    Subjective: Review of Systems  Constitutional:  Negative for chills and fever.  HENT:  Negative for congestion and hearing loss.   Eyes:  Negative for blurred vision and double vision.  Respiratory:  Negative for cough and shortness of breath.   Cardiovascular:  Negative for chest pain and palpitations.  Gastrointestinal:  Negative for abdominal pain, blood in stool, constipation, diarrhea, heartburn, melena and vomiting.  Genitourinary:  Negative for dysuria and urgency.  Musculoskeletal:  Negative for joint pain and myalgias.  Skin:  Negative for itching and rash.  Neurological:  Negative for dizziness and headaches.  Psychiatric/Behavioral:  Negative for depression. The patient is not nervous/anxious.      Objective: BP 109/72   Pulse 91   Temp (!) 97.5 F (36.4 C)   Ht 5\' 8"  (1.727 m)   Wt 115 lb 12.8 oz (52.5 kg)   BMI 17.61 kg/m  Physical Exam Constitutional:      Appearance: Normal appearance.  HENT:     Head: Normocephalic and atraumatic.  Eyes:     Extraocular Movements: Extraocular movements intact.     Conjunctiva/sclera: Conjunctivae normal.  Cardiovascular:     Rate and Rhythm: Normal rate and regular rhythm.  Pulmonary:     Effort: Pulmonary effort is normal.     Breath sounds: Normal breath sounds.  Abdominal:     General: Bowel sounds are normal.     Palpations: Abdomen is soft.  Musculoskeletal:        General: Normal range of motion.     Cervical back: Normal range of motion and neck supple.  Skin:    General: Skin is warm.  Neurological:     General: No focal deficit present.      Mental Status: He is alert and oriented to person, place, and time.  Psychiatric:        Mood and Affect: Mood normal.        Behavior: Behavior  normal.      Assessment/Plan:  1.  Gastric ulcer with GI bleeding-NSAID induced.  Biopsies negative for H. pylori.  Increase pantoprazole to twice daily x 12 weeks at which point he can decrease down to once daily.  Counseled on the importance of avoiding NSAIDs.  2.  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  Will order MRI today to further evaluate.  Check CA 19-9.  3.  Anemia, abnormal iron studies-hemoglobin steadily improved most recently 10.  Continue to monitor.  Tran saturation 82%.  Appears that he had a ferritin checked in 2011 which was >1000.  Will check ferritin today.  May need genetic testing for hemochromatosis.  4.  Colon cancer screening- Will schedule for screening colonoscopy.The risks including infection, bleed, or perforation as well as benefits, limitations, alternatives and imponderables have been reviewed with the patient. Questions have been answered. All parties agreeable.  Follow-up after colonoscopy  11/03/2022 2:46 PM   Disclaimer: This note was dictated with voice recognition software. Similar sounding words can inadvertently be transcribed and may not be corrected upon review.

## 2022-11-03 NOTE — Patient Instructions (Signed)
I am going to check blood work at [facility] today.  We will call with results.  I am also going to order an MRI of your pancreas given abnormal findings seen during her hospitalization.  Will schedule you for colonoscopy for colon cancer screening purposes today.  It was very nice seeing both you today.  Dr. Marletta Lor

## 2022-11-04 LAB — CANCER ANTIGEN 19-9: CA 19-9: 84 U/mL — ABNORMAL HIGH (ref 0–35)

## 2022-11-04 LAB — FERRITIN: Ferritin: 167 ng/mL (ref 30–400)

## 2022-11-07 ENCOUNTER — Encounter: Payer: Self-pay | Admitting: *Deleted

## 2022-11-07 ENCOUNTER — Other Ambulatory Visit: Payer: Self-pay | Admitting: *Deleted

## 2022-11-07 ENCOUNTER — Telehealth: Payer: Self-pay | Admitting: Internal Medicine

## 2022-11-07 ENCOUNTER — Telehealth: Payer: Self-pay | Admitting: *Deleted

## 2022-11-07 DIAGNOSIS — K8689 Other specified diseases of pancreas: Secondary | ICD-10-CM

## 2022-11-07 MED ORDER — PEG 3350-KCL-NA BICARB-NACL 420 G PO SOLR: 4000.0000 mL | Freq: Once | ORAL | 0 refills | Status: AC

## 2022-11-07 NOTE — Telephone Encounter (Signed)
Left message with spouse to have pt return call  MRI scheduled for 12/02/22, arrive at 6:45 am, npo 4 hours prior  TCS w/propofol asa 3, w/Dr.Carver

## 2022-11-07 NOTE — Telephone Encounter (Signed)
Pt informed of MRI. Colonoscopy scheduled for 12/09/22, instructions mailed and prep sent to the pharmacy. 

## 2022-11-07 NOTE — Addendum Note (Signed)
Addended by: Elinor Dodge on: 11/07/2022 08:12 AM   Modules accepted: Orders

## 2022-11-07 NOTE — Telephone Encounter (Signed)
Pt informed of MRI. Colonoscopy scheduled for 12/09/22, instructions mailed and prep sent to the pharmacy.

## 2022-11-07 NOTE — Telephone Encounter (Signed)
Patient was returning your call

## 2022-11-10 ENCOUNTER — Other Ambulatory Visit (HOSPITAL_COMMUNITY): Payer: Self-pay | Admitting: Otolaryngology

## 2022-11-10 DIAGNOSIS — H903 Sensorineural hearing loss, bilateral: Secondary | ICD-10-CM | POA: Diagnosis not present

## 2022-11-10 DIAGNOSIS — H918X3 Other specified hearing loss, bilateral: Secondary | ICD-10-CM

## 2022-11-10 DIAGNOSIS — H6123 Impacted cerumen, bilateral: Secondary | ICD-10-CM | POA: Diagnosis not present

## 2022-11-23 ENCOUNTER — Other Ambulatory Visit (HOSPITAL_COMMUNITY): Payer: Self-pay | Admitting: Internal Medicine

## 2022-11-23 ENCOUNTER — Ambulatory Visit (HOSPITAL_COMMUNITY)
Admission: RE | Admit: 2022-11-23 | Discharge: 2022-11-23 | Disposition: A | Discharge status: Home / Self Care | Payer: PPO | Source: Ambulatory Visit | Attending: Internal Medicine | Admitting: Internal Medicine

## 2022-11-23 DIAGNOSIS — Z681 Body mass index (BMI) 19 or less, adult: Secondary | ICD-10-CM | POA: Diagnosis not present

## 2022-11-23 DIAGNOSIS — M79604 Pain in right leg: Secondary | ICD-10-CM

## 2022-11-23 DIAGNOSIS — D649 Anemia, unspecified: Secondary | ICD-10-CM | POA: Diagnosis not present

## 2022-11-23 DIAGNOSIS — M7989 Other specified soft tissue disorders: Secondary | ICD-10-CM | POA: Diagnosis not present

## 2022-11-23 DIAGNOSIS — I739 Peripheral vascular disease, unspecified: Secondary | ICD-10-CM | POA: Diagnosis not present

## 2022-11-30 DIAGNOSIS — I4891 Unspecified atrial fibrillation: Secondary | ICD-10-CM | POA: Diagnosis not present

## 2022-11-30 DIAGNOSIS — Z681 Body mass index (BMI) 19 or less, adult: Secondary | ICD-10-CM | POA: Diagnosis not present

## 2022-11-30 DIAGNOSIS — M79606 Pain in leg, unspecified: Secondary | ICD-10-CM | POA: Diagnosis not present

## 2022-11-30 DIAGNOSIS — G9332 Myalgic encephalomyelitis/chronic fatigue syndrome: Secondary | ICD-10-CM | POA: Diagnosis not present

## 2022-11-30 DIAGNOSIS — R0989 Other specified symptoms and signs involving the circulatory and respiratory systems: Secondary | ICD-10-CM | POA: Diagnosis not present

## 2022-11-30 DIAGNOSIS — Z0001 Encounter for general adult medical examination with abnormal findings: Secondary | ICD-10-CM | POA: Diagnosis not present

## 2022-11-30 DIAGNOSIS — Z125 Encounter for screening for malignant neoplasm of prostate: Secondary | ICD-10-CM | POA: Diagnosis not present

## 2022-11-30 DIAGNOSIS — Z0000 Encounter for general adult medical examination without abnormal findings: Secondary | ICD-10-CM | POA: Diagnosis not present

## 2022-11-30 DIAGNOSIS — M79604 Pain in right leg: Secondary | ICD-10-CM | POA: Diagnosis not present

## 2022-11-30 DIAGNOSIS — I739 Peripheral vascular disease, unspecified: Secondary | ICD-10-CM | POA: Diagnosis not present

## 2022-11-30 DIAGNOSIS — E559 Vitamin D deficiency, unspecified: Secondary | ICD-10-CM | POA: Diagnosis not present

## 2022-11-30 DIAGNOSIS — Z1331 Encounter for screening for depression: Secondary | ICD-10-CM | POA: Diagnosis not present

## 2022-11-30 DIAGNOSIS — D518 Other vitamin B12 deficiency anemias: Secondary | ICD-10-CM | POA: Diagnosis not present

## 2022-11-30 DIAGNOSIS — I10 Essential (primary) hypertension: Secondary | ICD-10-CM | POA: Diagnosis not present

## 2022-12-01 ENCOUNTER — Ambulatory Visit (HOSPITAL_COMMUNITY)
Admission: RE | Admit: 2022-12-01 | Discharge: 2022-12-01 | Disposition: A | Discharge status: Home / Self Care | Payer: PPO | Source: Ambulatory Visit | Attending: Internal Medicine | Admitting: Internal Medicine

## 2022-12-01 ENCOUNTER — Other Ambulatory Visit (HOSPITAL_COMMUNITY): Payer: Self-pay | Admitting: Internal Medicine

## 2022-12-01 DIAGNOSIS — K862 Cyst of pancreas: Secondary | ICD-10-CM | POA: Diagnosis not present

## 2022-12-01 DIAGNOSIS — K8689 Other specified diseases of pancreas: Secondary | ICD-10-CM | POA: Insufficient documentation

## 2022-12-01 MED ORDER — GADOBUTROL 1 MMOL/ML IV SOLN
6.0000 mL | Freq: Once | INTRAVENOUS | Status: AC | PRN
  Administered 2022-12-01: 6 mL via INTRAVENOUS

## 2022-12-07 ENCOUNTER — Encounter (HOSPITAL_COMMUNITY)
Admission: RE | Admit: 2022-12-07 | Discharge: 2022-12-07 | Disposition: A | Discharge status: Home / Self Care | Payer: PPO | Source: Ambulatory Visit | Attending: Internal Medicine | Admitting: Internal Medicine

## 2022-12-07 ENCOUNTER — Encounter (HOSPITAL_COMMUNITY): Payer: Self-pay

## 2022-12-07 HISTORY — DX: Cardiac arrhythmia, unspecified: I49.9

## 2022-12-07 HISTORY — DX: Thyrotoxicosis, unspecified without thyrotoxic crisis or storm: E05.90

## 2022-12-08 ENCOUNTER — Telehealth: Payer: Self-pay

## 2022-12-08 NOTE — Telephone Encounter (Signed)
-----   Message from Lanelle Bal, DO sent at 12/08/2022  1:24 PM EDT ----- Looks like his cardiologist prescribes his Plavix (Dr. Diona Browner). Thanks! ----- Message ----- From: Lemar Lofty., MD Sent: 12/08/2022   5:59 AM EDT To: Lanelle Bal, DO  Todd [name], EUS is reasonable. Will need to get Plavix hold.  Who does that for him?  Todd [name], This patient needs EUS and will need plavix hold. Please schedule for a date in July or August. Thanks. GM ----- Message ----- From: Lanelle Bal, DO Sent: 12/07/2022   5:06 PM EDT To: Lemar Lofty., MD  GM,  I wanted to run this patient by you.  Admitted to our hospital end of April for acute GI bleeding and anemia.  Upper endoscopy with ulcer, likely NSAID induced in the setting of BC powder use.  Discharged on pantoprazole.    During his hospitalization, he underwent CT abdomen pelvis which showed diffuse calcifications of pancreas, PD dilatation, cystic structure in the pancreatic head.  History of significant alcohol abuse.  Saw him for follow-up in May and he was doing much better.  I ordered MRI which showed large multiseptated cystic lesion in the head of the pancreas approximately 4 cm concerning for possible cystic neoplasm.  CA 19-9 elevated 84.  Wanted to see if you think he warrants EUS to further evaluate?  Hope you are well. Todd [name]

## 2022-12-09 ENCOUNTER — Telehealth: Payer: Self-pay

## 2022-12-09 ENCOUNTER — Encounter (HOSPITAL_COMMUNITY): Payer: Self-pay

## 2022-12-09 ENCOUNTER — Ambulatory Visit (HOSPITAL_COMMUNITY): Payer: PPO | Admitting: Anesthesiology

## 2022-12-09 ENCOUNTER — Ambulatory Visit (HOSPITAL_COMMUNITY)
Admission: RE | Admit: 2022-12-09 | Discharge: 2022-12-09 | Disposition: A | Discharge status: Home / Self Care | Payer: PPO | Source: Ambulatory Visit | Attending: Internal Medicine | Admitting: Internal Medicine

## 2022-12-09 ENCOUNTER — Other Ambulatory Visit: Payer: Self-pay

## 2022-12-09 ENCOUNTER — Encounter (HOSPITAL_COMMUNITY)
Admission: RE | Disposition: A | Discharge status: Home / Self Care | Payer: Self-pay | Source: Ambulatory Visit | Attending: Internal Medicine

## 2022-12-09 ENCOUNTER — Ambulatory Visit (HOSPITAL_BASED_OUTPATIENT_CLINIC_OR_DEPARTMENT_OTHER): Payer: PPO | Admitting: Anesthesiology

## 2022-12-09 DIAGNOSIS — K649 Unspecified hemorrhoids: Secondary | ICD-10-CM | POA: Diagnosis not present

## 2022-12-09 DIAGNOSIS — D123 Benign neoplasm of transverse colon: Secondary | ICD-10-CM | POA: Insufficient documentation

## 2022-12-09 DIAGNOSIS — I4891 Unspecified atrial fibrillation: Secondary | ICD-10-CM | POA: Diagnosis not present

## 2022-12-09 DIAGNOSIS — K635 Polyp of colon: Secondary | ICD-10-CM | POA: Diagnosis not present

## 2022-12-09 DIAGNOSIS — F17210 Nicotine dependence, cigarettes, uncomplicated: Secondary | ICD-10-CM | POA: Insufficient documentation

## 2022-12-09 DIAGNOSIS — D509 Iron deficiency anemia, unspecified: Secondary | ICD-10-CM | POA: Diagnosis not present

## 2022-12-09 DIAGNOSIS — D124 Benign neoplasm of descending colon: Secondary | ICD-10-CM | POA: Insufficient documentation

## 2022-12-09 DIAGNOSIS — K648 Other hemorrhoids: Secondary | ICD-10-CM | POA: Diagnosis not present

## 2022-12-09 DIAGNOSIS — I739 Peripheral vascular disease, unspecified: Secondary | ICD-10-CM | POA: Diagnosis not present

## 2022-12-09 DIAGNOSIS — Z1211 Encounter for screening for malignant neoplasm of colon: Secondary | ICD-10-CM | POA: Diagnosis not present

## 2022-12-09 DIAGNOSIS — D126 Benign neoplasm of colon, unspecified: Secondary | ICD-10-CM | POA: Diagnosis not present

## 2022-12-09 DIAGNOSIS — K8689 Other specified diseases of pancreas: Secondary | ICD-10-CM

## 2022-12-09 DIAGNOSIS — I10 Essential (primary) hypertension: Secondary | ICD-10-CM

## 2022-12-09 HISTORY — PX: COLONOSCOPY WITH PROPOFOL: SHX5780

## 2022-12-09 HISTORY — PX: POLYPECTOMY: SHX149

## 2022-12-09 SURGERY — COLONOSCOPY WITH PROPOFOL
Anesthesia: General

## 2022-12-09 MED ORDER — PHENYLEPHRINE HCL (PRESSORS) 10 MG/ML IV SOLN
INTRAVENOUS | Status: DC | PRN
  Administered 2022-12-09: 80 ug via INTRAVENOUS

## 2022-12-09 MED ORDER — PROPOFOL 500 MG/50ML IV EMUL
INTRAVENOUS | Status: DC | PRN
  Administered 2022-12-09: 150 ug/kg/min via INTRAVENOUS

## 2022-12-09 MED ORDER — PROPOFOL 10 MG/ML IV BOLUS
INTRAVENOUS | Status: DC | PRN
  Administered 2022-12-09: 100 mg via INTRAVENOUS
  Administered 2022-12-09 (×2): 25 mg via INTRAVENOUS

## 2022-12-09 MED ORDER — LACTATED RINGERS IV SOLN: INTRAVENOUS | Status: DC

## 2022-12-09 NOTE — Op Note (Signed)
[facility] Patient Name: Todd [name] Procedure Date: 12/09/2022 11:32 AM MRN: [id number] Date of Birth: 1950/07/27 Attending MD: Hennie Duos. Maple Mirza, [id number] CSN: [id number] Age: 72 Admit Type: Outpatient Procedure:                Colonoscopy Indications:              Iron deficiency anemia Providers:                Hennie Duos. Marletta Lor, DO, Crystal Page, Dyann Ruddle Referring MD:              Medicines:                See the Anesthesia note for documentation of the                            administered medications Complications:            No immediate complications. Estimated Blood Loss:     Estimated blood loss was minimal. Procedure:                Pre-Anesthesia Assessment:                           - The anesthesia plan was to use monitored                            anesthesia care (MAC).                           After obtaining informed consent, the colonoscope                            was passed under direct vision. Throughout the                            procedure, the patient's blood pressure, pulse, and                            oxygen saturations were monitored continuously. The                            PCF-HQ190L ([id number]) scope was introduced through                            the anus and advanced to the the cecum, identified                            by appendiceal orifice and ileocecal valve. The                            colonoscopy was technically difficult and complex                            due to a redundant colon and significant looping.                            The  patient tolerated the procedure well. The                            quality of the bowel preparation was evaluated                            using the BBPS [facility] Bowel Preparation Scale)                            with scores of: Right Colon = 2 (minor amount of                            residual staining, small fragments of stool and/or                             opaque liquid, but mucosa seen well), Transverse                            Colon = 3 (entire mucosa seen well with no residual                            staining, small fragments of stool or opaque                            liquid) and Left Colon = 3 (entire mucosa seen well                            with no residual staining, small fragments of stool                            or opaque liquid). The total BBPS score equals 8.                            The quality of the bowel preparation was good. Scope In: 11:44:18 AM Scope Out: 12:06:25 PM Scope Withdrawal Time: 0 hours 13 minutes 15 seconds  Total Procedure Duration: 0 hours 22 minutes 7 seconds  Findings:      Non-bleeding internal hemorrhoids were found during endoscopy.      Three sessile polyps were found in the descending colon and transverse       colon. The polyps were 4 to 7 mm in size. These polyps were removed with       a cold snare. Resection and retrieval were complete.      The exam was otherwise without abnormality. Impression:               - Non-bleeding internal hemorrhoids.                           - Three 4 to 7 mm polyps in the descending colon                            and in the transverse colon, removed with a cold  snare. Resected and retrieved.                           - The examination was otherwise normal. Moderate Sedation:      Per Anesthesia Care Recommendation:           - Patient has a contact number available for                            emergencies. The signs and symptoms of potential                            delayed complications were discussed with the                            patient. Return to normal activities tomorrow.                            Written discharge instructions were provided to the                            patient.                           - Resume previous diet.                           - Continue present medications.                            - Await pathology results.                           - Repeat colonoscopy in 5 years for surveillance.                           - Return to GI clinic in 3 months. Procedure Code(s):        --- Professional ---                           [phone], Colonoscopy, flexible; with removal of                            tumor(s), polyp(s), or other lesion(s) by snare                            technique Diagnosis Code(s):        --- Professional ---                           D12.4, Benign neoplasm of descending colon                           D12.3, Benign neoplasm of transverse colon (hepatic                            flexure or splenic flexure)  K64.8, Other hemorrhoids                           D50.9, Iron deficiency anemia, unspecified CPT copyright 2022 American Medical Association. All rights reserved. The codes documented in this report are preliminary and upon coder review may  be revised to meet current compliance requirements. Hennie Duos. Marletta Lor, DO Hennie Duos. Marletta Lor, DO 12/09/2022 12:09:32 PM This report has been signed electronically. Number of Addenda: 0

## 2022-12-09 NOTE — Anesthesia Preprocedure Evaluation (Addendum)
Anesthesia Evaluation  Patient identified by MRN, date of birth, ID band Patient awake    Reviewed: Allergy & Precautions, H&P , NPO status , Patient's Chart, lab work & pertinent test results  Airway Mallampati: II  TM Distance: >3 FB Neck ROM: Full    Dental no notable dental hx. (+) Dental Advisory Given, Edentulous Upper, Poor Dentition, Chipped,    Pulmonary Current Smoker and Patient abstained from smoking.   Pulmonary exam normal breath sounds clear to auscultation       Cardiovascular Exercise Tolerance: Poor METS: 3 - Mets hypertension, Pt. on medications + Peripheral Vascular Disease  + dysrhythmias Atrial Fibrillation + Valvular Problems/Murmurs  Rhythm:Irregular Rate:Normal     Neuro/Psych negative neurological ROS  negative psych ROS   GI/Hepatic ,GERD  Medicated,,(+)     substance abuse  alcohol use  Endo/Other  Hypothyroidism    Renal/GU negative Renal ROS  negative genitourinary   Musculoskeletal  (+) Arthritis , Osteoarthritis,    Abdominal   Peds negative pediatric ROS (+)  Hematology  (+) Blood dyscrasia, anemia   Anesthesia Other Findings   Reproductive/Obstetrics negative OB ROS                             Anesthesia Physical Anesthesia Plan  ASA: 3  Anesthesia Plan: General   Post-op Pain Management: Minimal or no pain anticipated   Induction: Intravenous  PONV Risk Score and Plan: Propofol infusion  Airway Management Planned: Nasal Cannula and Natural Airway  Additional Equipment:   Intra-op Plan:   Post-operative Plan:   Informed Consent: I have reviewed the patients History and Physical, chart, labs and discussed the procedure including the risks, benefits and alternatives for the proposed anesthesia with the patient or authorized representative who has indicated his/her understanding and acceptance.     Dental advisory given  Plan Discussed  with: CRNA and Surgeon  Anesthesia Plan Comments:         Anesthesia Quick Evaluation

## 2022-12-09 NOTE — Discharge Instructions (Signed)
  Colonoscopy Discharge Instructions  Read the instructions outlined below and refer to this sheet in the next few weeks. These discharge instructions provide you with general information on caring for yourself after you leave the hospital. Your doctor may also give you specific instructions. While your treatment has been planned according to the most current medical practices available, unavoidable complications occasionally occur.   ACTIVITY You may resume your regular activity, but move at a slower pace for the next 24 hours.  Take frequent rest periods for the next 24 hours.  Walking will help get rid of the air and reduce the bloated feeling in your belly (abdomen).  No driving for 24 hours (because of the medicine (anesthesia) used during the test).   Do not sign any important legal documents or operate any machinery for 24 hours (because of the anesthesia used during the test).  NUTRITION Drink plenty of fluids.  You may resume your normal diet as instructed by your doctor.  Begin with a light meal and progress to your normal diet. Heavy or fried foods are harder to digest and may make you feel sick to your stomach (nauseated).  Avoid alcoholic beverages for 24 hours or as instructed.  MEDICATIONS You may resume your normal medications unless your doctor tells you otherwise.  WHAT YOU CAN EXPECT TODAY Some feelings of bloating in the abdomen.  Passage of more gas than usual.  Spotting of blood in your stool or on the toilet paper.  IF YOU HAD POLYPS REMOVED DURING THE COLONOSCOPY: No aspirin products for 7 days or as instructed.  No alcohol for 7 days or as instructed.  Eat a soft diet for the next 24 hours.  FINDING OUT THE RESULTS OF YOUR TEST Not all test results are available during your visit. If your test results are not back during the visit, make an appointment with your caregiver to find out the results. Do not assume everything is normal if you have not heard from your  caregiver or the medical facility. It is important for you to follow up on all of your test results.  SEEK IMMEDIATE MEDICAL ATTENTION IF: You have more than a spotting of blood in your stool.  Your belly is swollen (abdominal distention).  You are nauseated or vomiting.  You have a temperature over 101.  You have abdominal pain or discomfort that is severe or gets worse throughout the day.   Your colonoscopy revealed 3 polyp(s) which I removed successfully. Await pathology results, my office will contact you. I recommend repeating colonoscopy in 5 years for surveillance purposes. Follow up with Gi in 3 months   I hope you have a great rest of your week!  Cristian K. Vallerie Hentz, D.O. Gastroenterology and Hepatology [facility]  

## 2022-12-09 NOTE — Anesthesia Postprocedure Evaluation (Signed)
Anesthesia Post Note  Patient: Nunzio Cory III  Procedure(s) Performed: COLONOSCOPY WITH PROPOFOL POLYPECTOMY INTESTINAL  Patient location during evaluation: Phase II Anesthesia Type: General Level of consciousness: awake and alert and oriented Pain management: pain level controlled Vital Signs Assessment: post-procedure vital signs reviewed and stable Respiratory status: spontaneous breathing, nonlabored ventilation and respiratory function stable Cardiovascular status: blood pressure returned to baseline and stable Postop Assessment: no apparent nausea or vomiting Anesthetic complications: no  No notable events documented.   Last Vitals:  Vitals:   12/09/22 0900 12/09/22 1212  BP: 113/66 (!) 94/56  Pulse:  85  Resp: 10 17  Temp:  (!) 36.1 C  SpO2: 100% 100%    Last Pain:  Vitals:   12/09/22 1212  TempSrc: Axillary  PainSc: 0-No pain                 Barton Want C Madelyne Millikan

## 2022-12-09 NOTE — Telephone Encounter (Signed)
[facility] Pre-operative Risk Assessment     Request for surgical clearance:     Endoscopy Procedure  What type of surgery is being performed?     EEUS  When is this surgery scheduled?     02/07/23  What type of clearance is required ?   Pharmacy  Are there any medications that need to be held prior to surgery and how long? Plavix  Practice name and name of physician performing surgery?      [facility]  What is your office phone and fax number?      Phone- [phone]  Fax- [phone]  Anesthesia type (None, local, MAC, general) ?       MAC

## 2022-12-09 NOTE — Transfer of Care (Signed)
Immediate Anesthesia Transfer of Care Note  Patient: Todd [name]  Procedure(s) Performed: COLONOSCOPY WITH PROPOFOL POLYPECTOMY INTESTINAL  Patient Location: Short Stay  Anesthesia Type:General  Level of Consciousness: drowsy  Airway & Oxygen Therapy: Patient Spontanous Breathing  Post-op Assessment: Report given to RN and Post -op Vital signs reviewed and stable  Post vital signs: Reviewed and stable  Last Vitals:  Vitals Value Taken Time  BP 94/56 12/09/22 1212  Temp 36.1 C 12/09/22 1212  Pulse 85 12/09/22 1212  Resp 17 12/09/22 1212  SpO2 100 % 12/09/22 1212    Last Pain:  Vitals:   12/09/22 1212  TempSrc: Axillary  PainSc: 0-No pain         Complications: No notable events documented.

## 2022-12-09 NOTE — Telephone Encounter (Signed)
EUS has been scheduled for 02/07/23 at 1015 am at [facility] with GM. The pt is on Plavix. Message sent to the [facility] team   Left message on machine to call back

## 2022-12-12 LAB — SURGICAL PATHOLOGY

## 2022-12-12 NOTE — Telephone Encounter (Signed)
EUS scheduled, pt instructed and medications reviewed.  Patient instructions mailed to home.  Patient to call with any questions or concerns.  

## 2022-12-12 NOTE — Telephone Encounter (Signed)
   Patient Name: Todd [name]  DOB: 09-15-1950 MRN: [id number]  Primary Cardiologist: Nona Dell, MD  Chart reviewed as part of pre-operative protocol coverage.  Patient is on Plavix for history of PAD, managed per vascular surgery.  Therefore, recommendations for holding Plavix prior to upper endoscopy should come from managing provider (vascular surgery, Dr. Arbie Cookey).  From a cardiac standpoint, there is no contraindication to holding Plavix prior to procedure.  I will route this recommendation to the requesting party via Epic fax function and remove from pre-op pool.  Please call with questions.  Joylene Grapes, NP 12/12/2022, 11:27 AM

## 2022-12-14 ENCOUNTER — Ambulatory Visit (HOSPITAL_COMMUNITY)
Admission: RE | Admit: 2022-12-14 | Discharge: 2022-12-14 | Disposition: A | Discharge status: Home / Self Care | Payer: PPO | Source: Ambulatory Visit | Attending: Otolaryngology | Admitting: Otolaryngology

## 2022-12-14 DIAGNOSIS — I6782 Cerebral ischemia: Secondary | ICD-10-CM | POA: Diagnosis not present

## 2022-12-14 DIAGNOSIS — H918X3 Other specified hearing loss, bilateral: Secondary | ICD-10-CM | POA: Diagnosis not present

## 2022-12-14 DIAGNOSIS — R9089 Other abnormal findings on diagnostic imaging of central nervous system: Secondary | ICD-10-CM | POA: Diagnosis not present

## 2022-12-14 DIAGNOSIS — G9389 Other specified disorders of brain: Secondary | ICD-10-CM | POA: Diagnosis not present

## 2022-12-14 DIAGNOSIS — I6521 Occlusion and stenosis of right carotid artery: Secondary | ICD-10-CM | POA: Diagnosis not present

## 2022-12-14 MED ORDER — GADOBUTROL 1 MMOL/ML IV SOLN
6.0000 mL | Freq: Once | INTRAVENOUS | Status: AC | PRN
  Administered 2022-12-14: 6 mL via INTRAVENOUS

## 2022-12-14 NOTE — Telephone Encounter (Signed)
The pt has been advised that he should stop plavix 5 days prior to his procedure

## 2022-12-21 NOTE — H&P (Signed)
Primary Care Physician:  Elfredia Nevins, MD Primary Gastroenterologist:  Dr. Marletta Lor  Pre-Procedure History & Physical: HPI:  Todd [name] is a 72 y.o. male is here  for a colonoscopy to be performed for rectal bleeding  Past Medical History:  Diagnosis Date   Alcoholism [facility])    History of withdrawal and seizures   Atrial fibrillation (HCC)    Taken off of Coumadin in 2009 in the setting of GI bleed   Chronic back pain    Colitis    4/11   Dysrhythmia    Essential hypertension    Gout    Heart murmur    History of GI bleed    Hyperthyroidism    Internal hemorrhoids    Colonoscopy 11/09   Osteoarthritis    Peripheral vascular disease (HCC)    Schatzki's ring    EGD 11/09    Past Surgical History:  Procedure Laterality Date   ABDOMINAL AORTOGRAM N/A 03/08/2019   Procedure: ABDOMINAL AORTOGRAM;  Surgeon: Sherren Kerns, MD;  [facility]: [facility] INVASIVE CV LAB;  Service: Cardiovascular;  Laterality: N/A;   ABDOMINAL AORTOGRAM W/LOWER EXTREMITY N/A 03/02/2021   Procedure: ABDOMINAL AORTOGRAM W/LOWER EXTREMITY;  Surgeon: Nada Libman, MD;  Location: [facility] INVASIVE CV LAB;  Service: Cardiovascular;  Laterality: N/A;   APPLICATION OF WOUND VAC Right 07/22/2021   Procedure: APPLICATION OF WOUND VAC;  Surgeon: Nada Libman, MD;  Location: [facility] OR;  Service: Vascular;  Laterality: Right;   BIOPSY  10/20/2022   Procedure: BIOPSY;  Surgeon: Lanelle Bal, DO;  Location: [facility];  Service: Endoscopy;;   COLONOSCOPY WITH PROPOFOL N/A 10/20/2022   Procedure: COLONOSCOPY WITH PROPOFOL;  Surgeon: Lanelle Bal, DO;  [facility]: [facility];  Service: Endoscopy;  Laterality: N/A;   ENDARTERECTOMY Left 02/02/2022   Procedure: LEFT ENDARTERECTOMY CAROTID;  Surgeon: Nada Libman, MD;  Location: [facility] OR;  Service: Vascular;  Laterality: Left;   ESOPHAGOGASTRODUODENOSCOPY (EGD) WITH PROPOFOL N/A 10/20/2022   Procedure: ESOPHAGOGASTRODUODENOSCOPY (EGD) WITH PROPOFOL;  Surgeon:  Lanelle Bal, DO;  Location: [facility];  Service: Endoscopy;  Laterality: N/A;   Excision of gouty lesion     Left index finger   FEMORAL-POPLITEAL BYPASS GRAFT Right 03/12/2021   Procedure: RIGHT FEMORAL TO POPLITEAL ARTERY BYPASS GRAFTING USING THE PROPATEN GRAFT;  Surgeon: Nada Libman, MD;  Location: [facility] OR;  Service: Vascular;  Laterality: Right;   GROIN DEBRIDEMENT Right 07/22/2021   Procedure: RIGHT GROIN DEBRIDEMENT;  Surgeon: Nada Libman, MD;  Location: [facility] OR;  Service: Vascular;  Laterality: Right;   INSERTION OF ILIAC STENT Right 03/12/2021   Procedure: INSERTION OF  EXTERNAL ILIAC STENT;  Surgeon: Nada Libman, MD;  Location: [facility] OR;  Service: Vascular;  Laterality: Right;   LOWER EXTREMITY ANGIOGRAM Right 03/12/2021   Procedure: LOWER EXTREMITY ANGIOGRAM;  Surgeon: Nada Libman, MD;  Location: [facility] OR;  Service: Vascular;  Laterality: Right;   LOWER EXTREMITY ANGIOGRAPHY Bilateral 03/08/2019   Procedure: Lower Extremity Angiography;  Surgeon: Sherren Kerns, MD;  Location: [facility] INVASIVE CV LAB;  Service: Cardiovascular;  Laterality: Bilateral;   LOWER EXTREMITY ANGIOGRAPHY N/A 03/15/2021   Procedure: LOWER EXTREMITY ANGIOGRAPHY;  Surgeon: Maeola Harman, MD;  Location: [facility] INVASIVE CV LAB;  Service: Cardiovascular;  Laterality: N/A;   MASS EXCISION Right 07/07/2020   Procedure: EXCISION OF RIGHT FACIAL MASS;  Surgeon: Newman Pies, MD;  Location: [facility];  Service: ENT;  Laterality: Right;  PATCH ANGIOPLASTY Left 02/02/2022   Procedure: PATCH ANGIOPLASTY OF LEFT CAROTID USING XENOSURE BOVINE PERICARDIUM PATCH;  Surgeon: Nada Libman, MD;  Location: [facility] OR;  Service: Vascular;  Laterality: Left;   PERIPHERAL VASCULAR BALLOON ANGIOPLASTY Right 03/02/2021   Procedure: PERIPHERAL VASCULAR BALLOON ANGIOPLASTY;  Surgeon: Nada Libman, MD;  [facility];  Service: Cardiovascular;  Laterality: Right;  external iliac   PERIPHERAL  VASCULAR INTERVENTION Bilateral 03/08/2019   Procedure: PERIPHERAL VASCULAR INTERVENTION;  Surgeon: Sherren Kerns, MD;  [facility]: [facility];  Service: Cardiovascular;  Laterality: Bilateral;  ext iliac artery stent   PERIPHERAL VASCULAR INTERVENTION Left 03/02/2021   Procedure: PERIPHERAL VASCULAR INTERVENTION;  Surgeon: Nada Libman, MD;  [facility];  Service: Cardiovascular;  Laterality: Left;  external iliac   PERIPHERAL VASCULAR INTERVENTION Right 03/15/2021   Procedure: PERIPHERAL VASCULAR INTERVENTION;  Surgeon: Maeola Harman, MD;  Location[facility] INVASIVE CV LAB;  Service: Cardiovascular;  Laterality: Right;  external iliac   Right knee arthroscopy     SPINE SURGERY      Prior to Admission medications   Medication Sig Start Date End Date Taking? Authorizing Provider  aspirin EC 81 MG EC tablet Take 1 tablet (81 mg total) by mouth daily at 6 (six) AM. Swallow whole. 03/17/21  Yes Setzer, Lynnell Jude, PA-C  cephALEXin (KEFLEX) 500 MG capsule Take 500 mg by mouth 4 (four) times daily. 11/30/22  Yes [provider]  Cholecalciferol (VITAMIN D) 50 MCG (2000 UT) tablet Take 2,000 Units by mouth daily.   Yes [provider]  clopidogrel (PLAVIX) 75 MG tablet TAKE ONE TABLET BY MOUTH ONCE DAILY. 01/06/22  Yes Jonelle Sidle, MD  levothyroxine (SYNTHROID) 25 MCG tablet Take 25 mcg by mouth daily before breakfast.   Yes [provider]  Nutritional Supplements (ENSURE PO) Take 1 Container by mouth daily.   Yes [provider]  pantoprazole (PROTONIX) 40 MG tablet Take 1 tablet (40 mg total) by mouth 2 (two) times daily. 11/03/22 11/03/23 Yes Trisha Morandi, Stevon K, DO  rosuvastatin (CRESTOR) 10 MG tablet TAKE (1) TABLET BY MOUTH ONCE DAILY. 09/09/20  Yes Maeola Harman, MD    Allergies as of 11/07/2022   (No Known Allergies)    Family History  Problem Relation Age of Onset   Coronary artery disease Father    Coronary  artery disease Sister        MI at age 42    Social History   Socioeconomic History   Marital status: Married    Spouse name: Not on file   Number of children: 3   Years of education: Not on file   Highest education level: Not on file  Occupational History   Occupation: Retired    Associate Professor: [employer]  Tobacco Use   Smoking status: Every Day    Packs/day: .5    Types: Cigarettes    Passive exposure: Current   Smokeless tobacco: Never   Tobacco comments:    0.25-0.5 packs per day  Vaping Use   Vaping Use: Never used  Substance and Sexual Activity   Alcohol use: Yes    Alcohol/week: 5.0 standard drinks of alcohol    Types: 5 Cans of beer per week    Comment: less than a 6 pack of beer per week   Drug use: No   Sexual activity: Never  Other Topics Concern   Not on file  Social History Narrative  Not on file   Social Determinants of Health   Financial Resource Strain: Not on file  Food Insecurity: No Food Insecurity (10/17/2022)   Hunger Vital Sign    Worried About Running Out of Food in the Last Year: Never true    Ran Out of Food in the Last Year: Never true  Transportation Needs: No Transportation Needs (10/17/2022)   PRAPARE - Administrator, Civil Service (Medical): No    Lack of Transportation (Non-Medical): No  Physical Activity: Not on file  Stress: Not on file  Social Connections: Not on file  Intimate Partner Violence: Not At Risk (10/17/2022)   Humiliation, Afraid, Rape, and Kick questionnaire    Fear of Current or Ex-Partner: No    Emotionally Abused: No    Physically Abused: No    Sexually Abused: No    Review of Systems: See HPI, otherwise negative ROS  Physical Exam: Vital signs in last 24 hours:     General:   Alert,  Well-developed, well-nourished, pleasant and cooperative in NAD Head:  Normocephalic and atraumatic. Eyes:  Sclera clear, no icterus.   Conjunctiva pink. Ears:  Normal auditory acuity. Nose:  No  deformity, discharge,  or lesions. Msk:  Symmetrical without gross deformities. Normal posture. Extremities:  Without clubbing or edema. Neurologic:  Alert and  oriented x4;  grossly normal neurologically. Skin:  Intact without significant lesions or rashes. Psych:  Alert and cooperative. Normal mood and affect.  Impression/Plan: Todd [name] is here for a colonoscopy to be performed for rectal bleeding  The risks of the procedure including infection, bleed, or perforation as well as benefits, limitations, alternatives and imponderables have been reviewed with the patient. Questions have been answered. All parties agreeable.

## 2022-12-22 ENCOUNTER — Encounter (HOSPITAL_COMMUNITY): Payer: Self-pay | Admitting: Internal Medicine

## 2023-01-06 NOTE — Progress Notes (Signed)
Fax received from [employer] on 01/02/23 for medical clearance/medication hold for extraction and root tip removal to be signed by C. Chestine Spore, MD.  Provider signed on 01/03/23, form faxed back to sender on 01/04/23, verified successful, sent to scan center.

## 2023-01-20 ENCOUNTER — Other Ambulatory Visit: Payer: Self-pay | Admitting: Cardiology

## 2023-01-27 ENCOUNTER — Encounter (HOSPITAL_COMMUNITY): Payer: Self-pay | Admitting: Gastroenterology

## 2023-01-27 ENCOUNTER — Other Ambulatory Visit: Payer: Self-pay

## 2023-01-27 NOTE — Progress Notes (Signed)
Attempted to obtain medical history. Unable to reach pt. At this time. HIPAA complaint voicemail left with pre-surgical testing number. 

## 2023-01-30 ENCOUNTER — Encounter: Payer: Self-pay | Admitting: Internal Medicine

## 2023-01-31 DIAGNOSIS — M2022 Hallux rigidus, left foot: Secondary | ICD-10-CM | POA: Diagnosis not present

## 2023-01-31 DIAGNOSIS — M79672 Pain in left foot: Secondary | ICD-10-CM | POA: Diagnosis not present

## 2023-02-06 NOTE — Anesthesia Preprocedure Evaluation (Addendum)
Anesthesia Evaluation  Patient identified by MRN, date of birth, ID band Patient awake    Reviewed: Allergy & Precautions, NPO status , Patient's Chart, lab work & pertinent test results  Airway Mallampati: II  TM Distance: >3 FB Neck ROM: Full    Dental no notable dental hx. (+) Implants, Dental Advisory Given, Missing,    Pulmonary Current SmokerPatient did not abstain from smoking.   Pulmonary exam normal breath sounds clear to auscultation       Cardiovascular hypertension, + Peripheral Vascular Disease  Normal cardiovascular exam+ dysrhythmias Atrial Fibrillation  Rhythm:Regular Rate:Normal     Neuro/Psych  negative psych ROS   GI/Hepatic ,,,(+)     substance abuse  alcohol use  Endo/Other  Hypothyroidism    Renal/GU      Musculoskeletal  (+) Arthritis , Osteoarthritis,    Abdominal   Peds  Hematology  (+) Blood dyscrasia, anemia   Anesthesia Other Findings   Reproductive/Obstetrics negative OB ROS                             Anesthesia Physical Anesthesia Plan  ASA: 3  Anesthesia Plan: MAC   Post-op Pain Management:    Induction: Intravenous  PONV Risk Score and Plan: Propofol infusion and Treatment may vary due to age or medical condition  Airway Management Planned: Natural Airway and Nasal Cannula  Additional Equipment: None  Intra-op Plan:   Post-operative Plan:   Informed Consent: I have reviewed the patients History and Physical, chart, labs and discussed the procedure including the risks, benefits and alternatives for the proposed anesthesia with the patient or authorized representative who has indicated his/her understanding and acceptance.     Dental advisory given  Plan Discussed with: CRNA  Anesthesia Plan Comments: (Pancreatic dct dilitation)        Anesthesia Quick Evaluation

## 2023-02-07 ENCOUNTER — Ambulatory Visit (HOSPITAL_COMMUNITY): Payer: PPO | Admitting: Anesthesiology

## 2023-02-07 ENCOUNTER — Encounter (HOSPITAL_COMMUNITY): Payer: Self-pay | Admitting: Gastroenterology

## 2023-02-07 ENCOUNTER — Other Ambulatory Visit: Payer: Self-pay

## 2023-02-07 ENCOUNTER — Encounter (HOSPITAL_COMMUNITY)
Admission: RE | Disposition: A | Discharge status: Home / Self Care | Payer: Self-pay | Source: Ambulatory Visit | Attending: Gastroenterology

## 2023-02-07 ENCOUNTER — Ambulatory Visit (HOSPITAL_BASED_OUTPATIENT_CLINIC_OR_DEPARTMENT_OTHER): Payer: PPO | Admitting: Anesthesiology

## 2023-02-07 ENCOUNTER — Ambulatory Visit (HOSPITAL_COMMUNITY)
Admission: RE | Admit: 2023-02-07 | Discharge: 2023-02-07 | Disposition: A | Discharge status: Home / Self Care | Payer: PPO | Source: Ambulatory Visit | Attending: Gastroenterology | Admitting: Gastroenterology

## 2023-02-07 DIAGNOSIS — K3189 Other diseases of stomach and duodenum: Secondary | ICD-10-CM | POA: Diagnosis not present

## 2023-02-07 DIAGNOSIS — I4891 Unspecified atrial fibrillation: Secondary | ICD-10-CM | POA: Diagnosis not present

## 2023-02-07 DIAGNOSIS — S27818A Other injury of esophagus (thoracic part), initial encounter: Secondary | ICD-10-CM

## 2023-02-07 DIAGNOSIS — K222 Esophageal obstruction: Secondary | ICD-10-CM

## 2023-02-07 DIAGNOSIS — K869 Disease of pancreas, unspecified: Secondary | ICD-10-CM

## 2023-02-07 DIAGNOSIS — E782 Mixed hyperlipidemia: Secondary | ICD-10-CM | POA: Diagnosis not present

## 2023-02-07 DIAGNOSIS — I10 Essential (primary) hypertension: Secondary | ICD-10-CM

## 2023-02-07 DIAGNOSIS — F17210 Nicotine dependence, cigarettes, uncomplicated: Secondary | ICD-10-CM | POA: Insufficient documentation

## 2023-02-07 DIAGNOSIS — I899 Noninfective disorder of lymphatic vessels and lymph nodes, unspecified: Secondary | ICD-10-CM

## 2023-02-07 DIAGNOSIS — K8689 Other specified diseases of pancreas: Secondary | ICD-10-CM

## 2023-02-07 DIAGNOSIS — K31819 Angiodysplasia of stomach and duodenum without bleeding: Secondary | ICD-10-CM | POA: Insufficient documentation

## 2023-02-07 DIAGNOSIS — K838 Other specified diseases of biliary tract: Secondary | ICD-10-CM | POA: Diagnosis not present

## 2023-02-07 DIAGNOSIS — K8020 Calculus of gallbladder without cholecystitis without obstruction: Secondary | ICD-10-CM | POA: Diagnosis not present

## 2023-02-07 DIAGNOSIS — K862 Cyst of pancreas: Secondary | ICD-10-CM

## 2023-02-07 DIAGNOSIS — K860 Alcohol-induced chronic pancreatitis: Secondary | ICD-10-CM | POA: Diagnosis not present

## 2023-02-07 DIAGNOSIS — K8010 Calculus of gallbladder with chronic cholecystitis without obstruction: Secondary | ICD-10-CM | POA: Insufficient documentation

## 2023-02-07 DIAGNOSIS — K2289 Other specified disease of esophagus: Secondary | ICD-10-CM | POA: Diagnosis not present

## 2023-02-07 DIAGNOSIS — R932 Abnormal findings on diagnostic imaging of liver and biliary tract: Secondary | ICD-10-CM | POA: Diagnosis present

## 2023-02-07 HISTORY — PX: ESOPHAGOGASTRODUODENOSCOPY (EGD) WITH PROPOFOL: SHX5813

## 2023-02-07 HISTORY — PX: EUS: SHX5427

## 2023-02-07 HISTORY — DX: Anemia, unspecified: D64.9

## 2023-02-07 HISTORY — DX: Hypothyroidism, unspecified: E03.9

## 2023-02-07 HISTORY — PX: HOT HEMOSTASIS: SHX5433

## 2023-02-07 HISTORY — PX: FINE NEEDLE ASPIRATION: SHX5430

## 2023-02-07 SURGERY — UPPER ENDOSCOPIC ULTRASOUND (EUS) RADIAL
Anesthesia: Monitor Anesthesia Care

## 2023-02-07 MED ORDER — CIPROFLOXACIN IN D5W 400 MG/200ML IV SOLN
INTRAVENOUS | Status: DC | PRN
  Administered 2023-02-07: 400 mg via INTRAVENOUS

## 2023-02-07 MED ORDER — CIPROFLOXACIN HCL 500 MG PO TABS: 500.0000 mg | ORAL_TABLET | Freq: Two times a day (BID) | ORAL | 0 refills | Status: AC

## 2023-02-07 MED ORDER — SODIUM CHLORIDE 0.9 % IV SOLN: INTRAVENOUS | Status: DC

## 2023-02-07 MED ORDER — LACTATED RINGERS IV SOLN: INTRAVENOUS | Status: DC

## 2023-02-07 MED ORDER — CIPROFLOXACIN IN D5W 400 MG/200ML IV SOLN
INTRAVENOUS | Status: AC
  Filled 2023-02-07: qty 200

## 2023-02-07 MED ORDER — ESMOLOL HCL 100 MG/10ML IV SOLN
INTRAVENOUS | Status: DC | PRN
Start: 2023-02-07 — End: 2023-02-07
  Administered 2023-02-07: 20 mg via INTRAVENOUS

## 2023-02-07 MED ORDER — LIDOCAINE 2% (20 MG/ML) 5 ML SYRINGE
INTRAMUSCULAR | Status: DC | PRN
  Administered 2023-02-07: 100 mg via INTRAVENOUS

## 2023-02-07 MED ORDER — GLUCAGON HCL RDNA (DIAGNOSTIC) 1 MG IJ SOLR
INTRAMUSCULAR | Status: AC
  Filled 2023-02-07: qty 1

## 2023-02-07 MED ORDER — PROPOFOL 500 MG/50ML IV EMUL
INTRAVENOUS | Status: DC | PRN
  Administered 2023-02-07: 150 ug/kg/min via INTRAVENOUS

## 2023-02-07 MED ORDER — CLOPIDOGREL BISULFATE 75 MG PO TABS: 75.0000 mg | ORAL_TABLET | Freq: Every day | ORAL | 0 refills | Status: DC

## 2023-02-07 MED ORDER — GLYCOPYRROLATE 0.2 MG/ML IJ SOLN
INTRAMUSCULAR | Status: DC | PRN
  Administered 2023-02-07: .1 mg via INTRAVENOUS

## 2023-02-07 MED ORDER — PROPOFOL 10 MG/ML IV BOLUS
INTRAVENOUS | Status: DC | PRN
Start: 2023-02-07 — End: 2023-02-07
  Administered 2023-02-07: 40 mg via INTRAVENOUS

## 2023-02-07 MED ORDER — PHENYLEPHRINE HCL-NACL 20-0.9 MG/250ML-% IV SOLN
INTRAVENOUS | Status: DC | PRN
  Administered 2023-02-07: 50 ug/min via INTRAVENOUS

## 2023-02-07 MED ORDER — PHENYLEPHRINE 80 MCG/ML (10ML) SYRINGE FOR IV PUSH (FOR BLOOD PRESSURE SUPPORT)
PREFILLED_SYRINGE | INTRAVENOUS | Status: DC | PRN
  Administered 2023-02-07: 160 ug via INTRAVENOUS
  Administered 2023-02-07: 80 ug via INTRAVENOUS
  Administered 2023-02-07: 160 ug via INTRAVENOUS
  Administered 2023-02-07: 80 ug via INTRAVENOUS
  Administered 2023-02-07: 240 ug via INTRAVENOUS
  Administered 2023-02-07: 160 ug via INTRAVENOUS

## 2023-02-07 NOTE — Anesthesia Procedure Notes (Signed)
Procedure Name: MAC Date/Time: 02/07/2023 10:32 AM  Performed by: Lovie Chol, CRNAPre-anesthesia Checklist: Patient identified, Emergency Drugs available, Suction available and Patient being monitored Patient Re-evaluated:Patient Re-evaluated prior to induction Oxygen Delivery Method: Simple face mask

## 2023-02-07 NOTE — Discharge Instructions (Signed)
YOU HAD AN ENDOSCOPIC PROCEDURE TODAY: Refer to the procedure report and other information in the discharge instructions given to you for any specific questions about what was found during the examination. If this information does not answer your questions, please call [place] office at [phone] to clarify.   YOU SHOULD EXPECT: Some feelings of bloating in the abdomen. Passage of more gas than usual. Walking can help get rid of the air that was put into your GI tract during the procedure and reduce the bloating. If you had a lower endoscopy (such as a colonoscopy or flexible sigmoidoscopy) you may notice spotting of blood in your stool or on the toilet paper. Some abdominal soreness may be present for a day or two, also.  DIET: Your first meal following the procedure should be a light meal and then it is ok to progress to your normal diet. A half-sandwich or bowl of soup is an example of a good first meal. Heavy or fried foods are harder to digest and may make you feel nauseous or bloated. Drink plenty of fluids but you should avoid alcoholic beverages for 24 hours. If you had a esophageal dilation, please see attached instructions for diet.    ACTIVITY: Your care partner should take you home directly after the procedure. You should plan to take it easy, moving slowly for the rest of the day. You can resume normal activity the day after the procedure however YOU SHOULD NOT DRIVE, use power tools, machinery or perform tasks that involve climbing or major physical exertion for 24 hours (because of the sedation medicines used during the test).   SYMPTOMS TO REPORT IMMEDIATELY: A gastroenterologist can be reached at any hour. Please call [phone]  for any of the following symptoms:   Following upper endoscopy (EGD, EUS, ERCP, esophageal dilation) Vomiting of blood or coffee ground material  New, significant abdominal pain  New, significant chest pain or pain under the shoulder blades  Painful or  persistently difficult swallowing  New shortness of breath  Black, tarry-looking or red, bloody stools  FOLLOW UP:  If any biopsies were taken you will be contacted by phone or by letter within the next 1-3 weeks. Call [phone]  if you have not heard about the biopsies in 3 weeks.  Please also call with any specific questions about appointments or follow up tests.  

## 2023-02-07 NOTE — Anesthesia Postprocedure Evaluation (Signed)
Anesthesia Post Note  Patient: Todd [name]  Procedure(s) Performed: UPPER ENDOSCOPIC ULTRASOUND (EUS) RADIAL ESOPHAGOGASTRODUODENOSCOPY (EGD) WITH PROPOFOL HOT HEMOSTASIS (ARGON PLASMA COAGULATION/BICAP) FINE NEEDLE ASPIRATION (FNA) LINEAR     Patient location during evaluation: Endoscopy Anesthesia Type: MAC Level of consciousness: awake and alert Pain management: pain level controlled Vital Signs Assessment: post-procedure vital signs reviewed and stable Respiratory status: spontaneous breathing, nonlabored ventilation, respiratory function stable and patient connected to nasal cannula oxygen Cardiovascular status: blood pressure returned to baseline and stable Postop Assessment: no apparent nausea or vomiting Anesthetic complications: no   No notable events documented.  Last Vitals:  Vitals:   02/07/23 1150 02/07/23 1200  BP: 110/65 114/68  Pulse: 90 77  Resp: (!) 22 19  Temp:    SpO2: 93% 94%    Last Pain:  Vitals:   02/07/23 1200  TempSrc:   PainSc: 0-No pain                 Trevor Iha

## 2023-02-07 NOTE — Transfer of Care (Signed)
Immediate Anesthesia Transfer of Care Note  Patient: Todd [name]  Procedure(s) Performed: UPPER ENDOSCOPIC ULTRASOUND (EUS) RADIAL ESOPHAGOGASTRODUODENOSCOPY (EGD) WITH PROPOFOL HOT HEMOSTASIS (ARGON PLASMA COAGULATION/BICAP) FINE NEEDLE ASPIRATION (FNA) LINEAR  Patient Location: Endoscopy Unit  Anesthesia Type:MAC  Level of Consciousness: sedated  Airway & Oxygen Therapy: Patient Spontanous Breathing and Patient connected to face mask oxygen  Post-op Assessment: Report given to RN and Post -op Vital signs reviewed and stable  Post vital signs: Reviewed  Last Vitals:  Vitals Value Taken Time  BP 103/63 02/07/23 1130  Temp    Pulse 96 02/07/23 1132  Resp 15 02/07/23 1132  SpO2 98 % 02/07/23 1132  Vitals shown include unfiled device data.  Last Pain:  Vitals:   02/07/23 0900  TempSrc: Temporal  PainSc: 0-No pain         Complications: No notable events documented.

## 2023-02-07 NOTE — Op Note (Signed)
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 Marletta Lor, DO Medicines:                Monitored Anesthesia Care, Cipro 400 mg IV Complications:            No immediate complications. Estimated Blood Loss:     Estimated blood loss was minimal. Procedure:                Pre-Anesthesia Assessment:                           - Prior to the procedure, a History and Physical                            was performed, and patient medications and                            allergies were reviewed. The patient's tolerance of                            previous anesthesia was also reviewed. The risks                            and benefits of the procedure and the sedation                            options and risks were discussed with the patient.                            All questions were answered, and informed consent                            was obtained. Prior Anticoagulants: The patient has                            taken Plavix (clopidogrel), last dose was 5 days                            prior to procedure. ASA Grade Assessment: III - A  patient with severe systemic disease. After                            reviewing the risks and benefits, the patient was                            deemed in satisfactory condition to undergo the                            procedure.                           After obtaining informed consent, the endoscope was                            passed under  direct vision. Throughout the                            procedure, the patient's blood pressure, pulse, and                            oxygen saturations were monitored continuously. The                            [id number]-H190 ([id number]) Olympus endoscope was introduced                            through the mouth, and advanced to the second part                            of duodenum. The [id number]-Q190V ([id number]) Olympus                            duodenoscope was introduced through the mouth, and                            advanced to the area of papilla. The [id number]                            ([id number]) Olympus linear ultrasound scope was                            introduced through the mouth, and advanced to the                            duodenum for ultrasound examination from the                            stomach and duodenum. The upper EUS was                            accomplished without difficulty. The patient  tolerated the procedure. Scope In: Scope Out: Findings:      ENDOSCOPIC FINDING: :      No gross mucosal lesions were noted in the entire esophagus.      A non-obstructing Schatzki ring was found at the gastroesophageal       junction.      The Z-line was irregular and was found 35 cm from the incisors.      Patchy mildly erythematous mucosa without bleeding was found in the       entire examined stomach. This was previously biopsied and negative for       H. pylori; it was not rebiopsied.      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 Fulguration to ablate the       lesion to prevent bleeding by argon plasma was successful.      A medium scar from previous ulcer was found in the prepyloric region of       the stomach. The scar tissue was healthy in appearance.      A mild angulation deformity was found in the duodenal sweep.      The major papilla was normal.      No  other gross lesions were noted in the duodenal bulb, in the first       portion of the duodenum and in the second portion of the duodenum.      After the ERCP scope had been removed and noted a superficial mucosal       tear just below the upper esophageal sphincter. This was medium in size.       This is likely a result of passage of the ERCP scope. Did not see any       evidence of perforation when transitioning back to EGD scope. The linear       echoendoscope was able to traverse this area with ease.Marland Kitchen      ENDOSONOGRAPHIC FINDING: :      Pancreatic parenchymal abnormalities were noted in the pancreatic head,       genu of the pancreas, pancreatic body and pancreatic tail. These       consisted of atrophy, hyperechoic foci with shadowing and hyperechoic       strands. The majority of the pancreatic parenchymal changes was noted       within the head and neck region. This obscures the visualization and       decreases sensitivity as result of the shadowing effect.      Within the head of the pancreas, an intraductal stone that was 5 mm x 4       mm in size was noted. The pancreatic duct had a dilated endosonographic       appearance thereafter pancreas and tail of the pancreas., had an       irregularly contoured endosonographic appearance and had a prominently       branched endosonographic appearance in the pancreatic head, genu of the       pancreas, body of pancreas, and tail of the pancreas.      An anechoic and multicystic lesion suggestive of a cyst was identified       in the pancreatic head/genu. It is not in obvious communication with the       pancreatic duct. The lesion measured 30 mm by 16 mm in maximal       cross-sectional  diameter. Multiple thinly septated components were       noted. The outer wall of the lesion was thin. There was no associated       mass. There was no internal debris within the fluid-filled cavity.       Diagnostic needle aspiration for fluid was  performed of the largest       cystic component that was approximately 14 mm x 10 mm. Color Doppler       imaging was utilized prior to needle puncture to confirm a lack of       significant vascular structures within the needle path. One pass was       made with the Olympus EZ shot 3+ 22 gauge needle using a transduodenal       approach. Even with short positioning, the amount of fibrotic and       hyperechoic calcified lesions within the head did make passage of the       needle more difficult requiring multiple attempts at traversing into the       head itself. Eventually we entered the largest cavity. The amount of       fluid collected was approximately 2 mL. The fluid was cloudy, serous and       slightly viscous. Sample(s) were sent for glucose, amylase       concentration, cytology and CEA analysis.      There was a suggestion of a stenosis/narrowing in the lower third of the       main bile duct that was approximately 2 cm in length.      Prominence of the biliary duct above the narrowing and into the CHD was       noted up to 8.2 mm. No evidence of any stones/choledocholithiasis within       the bile duct itself.      Multiple stones were visualized endosonographically in the gallbladder.       The stones were round. They were hyperechoic and characterized by       shadowing.      Endosonographic imaging of the ampulla showed no intramural       (subepithelial) lesion.      Endosonographic imaging in the visualized portion of the liver showed no       mass.      No malignant-appearing lymph nodes were visualized in the celiac region       (level 20), peripancreatic region and porta hepatis region.      The celiac region was visualized. Impression:               EGD impression:                           - No gross lesions in the entire esophagus.                            Non-obstructing Schatzki ring. Z-line irregular, 35                            cm from the incisors.                            - Erythematous mucosa in the stomach.                           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 Treated with argon plasma                            coagulation (APC).                           - Scar in the prepyloric region of the stomach from                            previous ulcer.                           - Duodenal deformity at the sweep                           - Normal major papilla.                           - No other gross lesions in the duodenal bulb, in                            the first portion of the duodenum and in the second                            portion of the duodenum.                           - Noted at end of passage of the ERCP scope,                            incidental superficial mucosal tear just below the                            UES due to likely scope passage no evidence of                            perforation.                           EUS impression:                           - Pancreatic parenchymal abnormalities consisting                            of atrophy, hyperechoic foci and hyperechoic                            strands were noted in the pancreatic head, genu of                            the pancreas, pancreatic body and pancreatic tail.                           -  The pancreatic duct had a dilated endosonographic                            appearance and prominence throughout. Within the                            pancreatic head there was an intraductal stone.                            This is where the duct dilation becomes more                            significant. The pancreatic duct had an irregularly                            contoured endosonographic appearance and had a                            prominently branched endosonographic appearance in                            the pancreatic head, genu of the pancreas, body of                             the pancreas and tail of the pancreas.                           - A multicystic lesion was seen in the pancreatic                            head/genu of the pancreas. Cytology results are                            pending. Fine needle aspiration for fluid performed                            of the largest compartment. There was no solid                            component to this area. It was difficult to get                            needle to pass into cyst.                           - There was a suggestion of a stricture in the                            lower third of the main bile duct.                           - There was prominence of the CBD and CHD.                           -  Multiple stones were visualized                            endosonographically in the gallbladder.                           - No malignant-appearing lymph nodes were                            visualized in the celiac region (level 20),                            peripancreatic region and porta hepatis region. Moderate Sedation:      Not Applicable - Patient had care per Anesthesia. Recommendation:           - The patient will be observed post-procedure,                            until all discharge criteria are met.                           - Discharge patient to home.                           - Patient has a contact number available for                            emergencies. The signs and symptoms of potential                            delayed complications were discussed with the                            patient. Return to normal activities tomorrow.                            Written discharge instructions were provided to the                            patient.                           - Low fat diet for 1 week.                           - Await cytology results.                           - Ciprofloxacin 500 mg twice daily for 5 days total                             to decrease post interventional infectious risk.                           - May restart Plavix in 3 days (8/16) to decrease  post interventional bleeding risk.                           - Recommend in a few weeks obtaining HFP. Suspect                            that this is more an inflammatory narrowing rather                            than a malignant narrowing at least at this point.                            Will need to see the cytology results of the cyst                            because if this is a premalignant cyst, then if he                            is developing biliary obstruction or progressive                            LFT abnormalities, this could then require                            additional thoughts of surgical management.                           - If patient has not already been evaluated for                            exocrine pancreas insufficiency, would recommend                            that.                           - If the patient were to have multiple episodes in                            future of idiopathic pancreatitis without any                            alcohol consumption, then queried the role of                            pancreatic duct evaluation ERCP for trying to                            traverse the pancreatic duct stone and removing it.                            If the patient were to have LFT abnormalities that  were suggestive of a biliary obstruction picture,                            he may need ERCP for stenting attempt.                           - Repeat imaging will be considered once                            pathology/cytology returns with the type of cyst                            this ends up being. Likely further discussions at                            multidisciplinary conference will be required.                           - Monitor for signs/symptoms of  bleeding,                            perforation, and infection. If issues please call                            our number to get further assistance as needed.                           - The findings and recommendations were discussed                            with the patient.                           - The findings and recommendations were discussed                            with the patient's family. Procedure Code(s):        --- Professional ---                           [phone], Esophagogastroduodenoscopy, flexible,                            transoral; with transendoscopic ultrasound-guided                            intramural or transmural fine needle                            aspiration/biopsy(s), (includes endoscopic                            ultrasound examination limited to the esophagus,                            stomach or  duodenum, and adjacent structures)                           43255, 59, Esophagogastroduodenoscopy, flexible,                            transoral; with control of bleeding, any method Diagnosis Code(s):        --- Professional ---                           K22.2, Esophageal obstruction                           K22.89, Other specified disease of esophagus                           K31.89, Other diseases of stomach and duodenum                           S27.818A, Other injury of esophagus (thoracic                            part), initial encounter                           K86.9, Disease of pancreas, unspecified                           K86.89, Other specified diseases of pancreas                           R93.3, Abnormal findings on diagnostic imaging of                            other parts of digestive tract                           K86.2, Cyst of pancreas                           K80.20, Calculus of gallbladder without                            cholecystitis without obstruction                           I89.9, Noninfective disorder of  lymphatic vessels                            and lymph nodes, unspecified                           K83.8, Other specified diseases of biliary tract                           R93.2, Abnormal findings on diagnostic imaging of  liver and biliary tract CPT copyright 2022 [employer]. All rights reserved. The codes documented in this report are preliminary and upon coder review may  be revised to meet current compliance requirements. Corliss Parish, MD 02/07/2023 11:52:10 AM Number of Addenda: 0

## 2023-02-07 NOTE — H&P (Addendum)
GASTROENTEROLOGY PROCEDURE H&P NOTE   Primary Care Physician: Elfredia Nevins, MD  HPI: Todd [name] is a 72 y.o. 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   History of GI bleed    Hypothyroidism    Internal hemorrhoids    Colonoscopy 11/09   Osteoarthritis    Peripheral vascular disease (HCC)    Schatzki's ring    EGD 11/09   Past Surgical History:  Procedure Laterality Date   ABDOMINAL AORTOGRAM N/A 03/08/2019   Procedure: ABDOMINAL AORTOGRAM;  Surgeon: Sherren Kerns, MD;  [facility]: [facility];  Service: Cardiovascular;  Laterality: N/A;   ABDOMINAL AORTOGRAM W/LOWER EXTREMITY N/A 03/02/2021   Procedure: ABDOMINAL AORTOGRAM W/LOWER EXTREMITY;  Surgeon: Nada Libman, MD;  [facility]: [facility];  Service: Cardiovascular;  Laterality: N/A;   APPLICATION OF WOUND VAC Right 07/22/2021   Procedure: APPLICATION OF WOUND VAC;  Surgeon: Nada Libman, MD;  Location: [facility];  Service: Vascular;  Laterality: Right;   BIOPSY  10/20/2022   Procedure: BIOPSY;  Surgeon: Lanelle Bal, DO;  [facility];  Service: Endoscopy;;   COLONOSCOPY WITH PROPOFOL N/A 10/20/2022   Procedure: COLONOSCOPY WITH PROPOFOL;  Surgeon: Lanelle Bal, DO;  [facility];  Service: Endoscopy;  Laterality: N/A;   COLONOSCOPY WITH PROPOFOL N/A 12/09/2022   Procedure: COLONOSCOPY WITH PROPOFOL;  Surgeon: Lanelle Bal, DO;  [facility];  Service: Endoscopy;  Laterality: N/A;  10:15 AM, ASA 3   ENDARTERECTOMY Left 02/02/2022   Procedure: LEFT ENDARTERECTOMY CAROTID;  Surgeon:  Nada Libman, MD;  [facility]: [facility];  Service: Vascular;  Laterality: Left;   ESOPHAGOGASTRODUODENOSCOPY (EGD) WITH PROPOFOL N/A 10/20/2022   Procedure: ESOPHAGOGASTRODUODENOSCOPY (EGD) WITH PROPOFOL;  Surgeon: Lanelle Bal, DO;  [facility];  Service: Endoscopy;  Laterality: N/A;   Excision of gouty lesion     Left index finger   FEMORAL-POPLITEAL BYPASS GRAFT Right 03/12/2021   Procedure: RIGHT FEMORAL TO POPLITEAL ARTERY BYPASS GRAFTING USING THE PROPATEN GRAFT;  Surgeon: Nada Libman, MD;  [facility]: [facility];  Service: Vascular;  Laterality: Right;   GROIN DEBRIDEMENT Right 07/22/2021   Procedure: RIGHT GROIN DEBRIDEMENT;  Surgeon: Nada Libman, MD;  Location: [facility] OR;  Service: Vascular;  Laterality: Right;   INSERTION OF ILIAC STENT Right 03/12/2021   Procedure: INSERTION OF  EXTERNAL ILIAC STENT;  Surgeon: Nada Libman, MD;  [facility];  Service: Vascular;  Laterality: Right;   LOWER EXTREMITY ANGIOGRAM Right 03/12/2021   Procedure: LOWER EXTREMITY ANGIOGRAM;  Surgeon: Nada Libman, MD;  [facility];  Service: Vascular;  Laterality: Right;   LOWER EXTREMITY ANGIOGRAPHY Bilateral 03/08/2019   Procedure: Lower Extremity Angiography;  Surgeon: Sherren Kerns, MD;  Location: [facility] INVASIVE CV LAB;  Service: Cardiovascular;  Laterality: Bilateral;   LOWER EXTREMITY ANGIOGRAPHY N/A 03/15/2021   Procedure: LOWER EXTREMITY ANGIOGRAPHY;  Surgeon: Maeola Harman, MD;  Location: [facility] INVASIVE CV  LAB;  Service: Cardiovascular;  Laterality: N/A;   MASS EXCISION Right 07/07/2020   Procedure: EXCISION OF RIGHT FACIAL MASS;  Surgeon: Newman Pies, MD;  Location: [facility];  Service: ENT;  Laterality: Right;   PATCH ANGIOPLASTY Left 02/02/2022   Procedure: PATCH ANGIOPLASTY OF LEFT CAROTID USING XENOSURE BOVINE PERICARDIUM PATCH;  Surgeon: Nada Libman, MD;  [facility];  Service: Vascular;  Laterality: Left;   PERIPHERAL VASCULAR BALLOON  ANGIOPLASTY Right 03/02/2021   Procedure: PERIPHERAL VASCULAR BALLOON ANGIOPLASTY;  Surgeon: Nada Libman, MD;  [facility];  Service: Cardiovascular;  Laterality: Right;  external iliac   PERIPHERAL VASCULAR INTERVENTION Bilateral 03/08/2019   Procedure: PERIPHERAL VASCULAR INTERVENTION;  Surgeon: Sherren Kerns, MD;  [facility];  Service: Cardiovascular;  Laterality: Bilateral;  ext iliac artery stent   PERIPHERAL VASCULAR INTERVENTION Left 03/02/2021   Procedure: PERIPHERAL VASCULAR INTERVENTION;  Surgeon: Nada Libman, MD;  [facility];  Service: Cardiovascular;  Laterality: Left;  external iliac   PERIPHERAL VASCULAR INTERVENTION Right 03/15/2021   Procedure: PERIPHERAL VASCULAR INTERVENTION;  Surgeon: Maeola Harman, MD;  Location: [facility] INVASIVE CV LAB;  Service: Cardiovascular;  Laterality: Right;  external iliac   POLYPECTOMY  12/09/2022   Procedure: POLYPECTOMY INTESTINAL;  Surgeon: Lanelle Bal, DO;  [facility];  Service: Endoscopy;;   Right knee arthroscopy     SPINE SURGERY     Current Facility-Administered Medications  Medication Dose Route Frequency Provider Last Rate Last Admin   0.9 %  sodium chloride infusion   Intravenous Continuous Mansouraty, Netty Starring., MD       lactated ringers infusion   Intravenous Continuous Mansouraty, Netty Starring., MD 10 mL/hr at 02/07/23 1013 Continued from Pre-op at 02/07/23 1013    Current Facility-Administered Medications:    0.9 %  sodium chloride infusion, , Intravenous, Continuous, Mansouraty, Netty Starring., MD   lactated ringers infusion, , Intravenous, Continuous, Mansouraty, Netty Starring., MD, Last Rate: 10 mL/hr at 02/07/23 1013, Continued from Pre-op at 02/07/23 1013 No Known Allergies Family History  Problem Relation Age of Onset   Coronary artery disease Father    Coronary artery disease Sister        MI at age 49   Social History   Socioeconomic  History   Marital status: Married    Spouse name: Not on file   Number of children: 3   Years of education: Not on file   Highest education level: Not on file  Occupational History   Occupation: Retired    Associate Professor: [employer]  Tobacco Use   Smoking status: Every Day    Current packs/day: 0.50    Types: Cigarettes    Passive exposure: Current   Smokeless tobacco: Never   Tobacco comments:    0.25-0.5 packs per day  Vaping Use   Vaping status: Never Used  Substance and Sexual Activity   Alcohol use: Yes    Alcohol/week: 5.0 standard drinks of alcohol    Types: 5 Cans of beer per week    Comment: less than a 6 pack of beer per week   Drug use: No   Sexual activity: Not Currently  Other Topics Concern   Not on file  Social History Narrative   Not on file   Social Determinants of Health   Financial Resource Strain: Not on file  Food Insecurity: No Food Insecurity (10/17/2022)   Hunger Vital Sign  Worried About Programme researcher, broadcasting/film/video in the Last Year: Never true    Ran Out of Food in the Last Year: Never true  Transportation Needs: No Transportation Needs (10/17/2022)   PRAPARE - Administrator, Civil Service (Medical): No    Lack of Transportation (Non-Medical): No  Physical Activity: Not on file  Stress: Not on file  Social Connections: Not on file  Intimate Partner Violence: Not At Risk (10/17/2022)   Humiliation, Afraid, Rape, and Kick questionnaire    Fear of Current or Ex-Partner: No    Emotionally Abused: No    Physically Abused: No    Sexually Abused: No    Physical Exam: Today's Vitals   02/07/23 0900  BP: 122/86  Pulse: 84  Resp: 15  Temp: 97.6 F (36.4 C)  TempSrc: Temporal  SpO2: 97%  Weight: 59 kg  Height: 5\' 8"  (1.727 m)  PainSc: 0-No pain   Body mass index is 19.77 kg/m. GEN: NAD EYE: Sclerae anicteric ENT: MMM CV: Non-tachycardic GI: Soft, NT/ND NEURO:  Alert & Oriented x 3  Lab Results: No results for input(s):  "WBC", "HGB", "HCT", "PLT" in the last 72 hours. BMET No results for input(s): "NA", "K", "CL", "CO2", "GLUCOSE", "BUN", "CREATININE", "CALCIUM" in the last 72 hours. LFT No results for input(s): "PROT", "ALBUMIN", "AST", "ALT", "ALKPHOS", "BILITOT", "BILIDIR", "IBILI" in the last 72 hours. PT/INR No results for input(s): "LABPROT", "INR" in the last 72 hours.   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  The risks of an EUS including intestinal perforation, bleeding, infection, aspiration, and medication effects were discussed as was the possibility it may not give a definitive diagnosis if a biopsy is performed.  When a biopsy of the pancreas is done as part of the EUS, there is an additional risk of pancreatitis at the rate of about 1-2%.  It was explained that procedure related pancreatitis is typically mild, although it can be severe and even life threatening, which is why we do not perform random pancreatic biopsies and only biopsy a lesion/area we feel is concerning enough to warrant the risk.   The risks and benefits of endoscopic evaluation/treatment were discussed with the patient and/or family; these include but are not limited to the risk of perforation, infection, bleeding, missed lesions, lack of diagnosis, severe illness requiring hospitalization, as well as anesthesia and sedation related illnesses.  The patient's history has been reviewed, patient examined, no change in status, and deemed stable for procedure.  The patient and/or family is agreeable to proceed.    Corliss Parish, MD [facility] Advanced Endoscopy Office # [phone]

## 2023-02-13 ENCOUNTER — Encounter (HOSPITAL_COMMUNITY): Payer: Self-pay | Admitting: Gastroenterology

## 2023-02-13 IMAGING — US US CAROTID DUPLEX BILAT
1 series · 13 of 24 positions shown · non-contrast
Comparison: None Available.

CLINICAL DATA: Carotid bruit

Hypertension
Hyperlipidemia
Current tobacco user
EXAM:
BILATERAL CAROTID DUPLEX ULTRASOUND
TECHNIQUE: Gray scale imaging, color Doppler and duplex ultrasound were
performed of bilateral carotid and vertebral arteries in the neck.

[Series 1: us carotid bilateral · 13 of 73 slices shown]
[im 1/73]
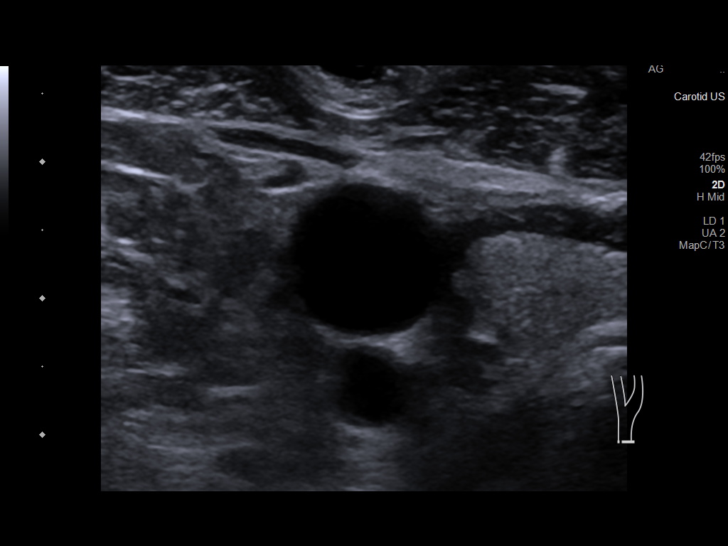
[im 7/73]
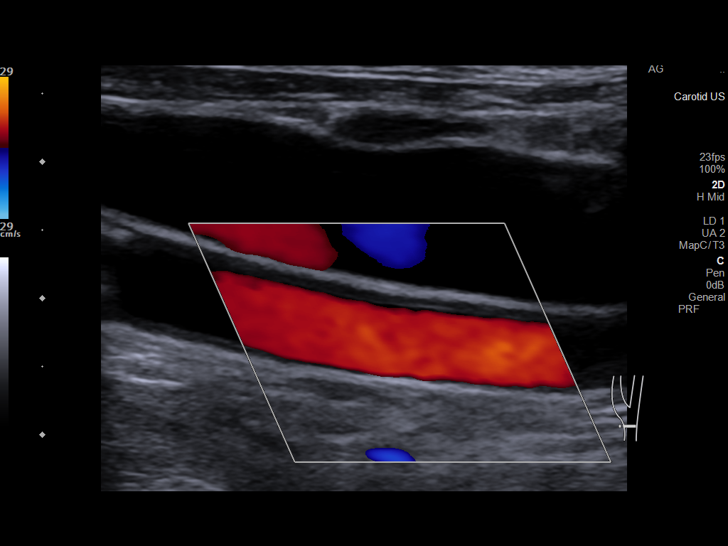
[im 13/73]
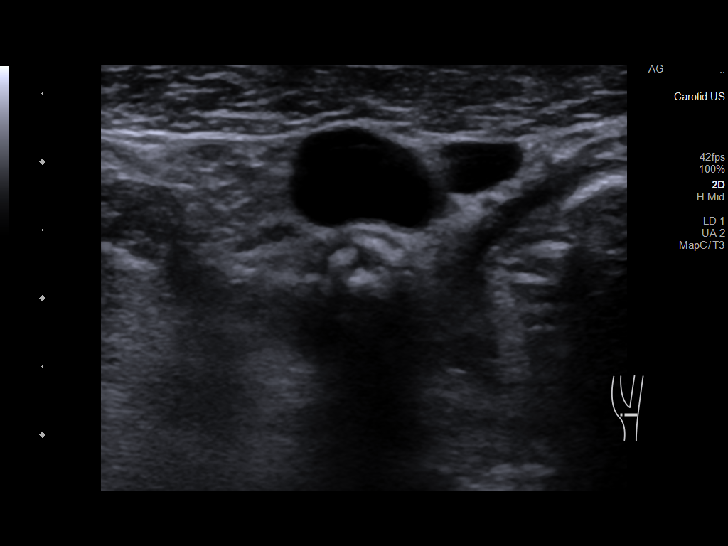
[im 19/73]
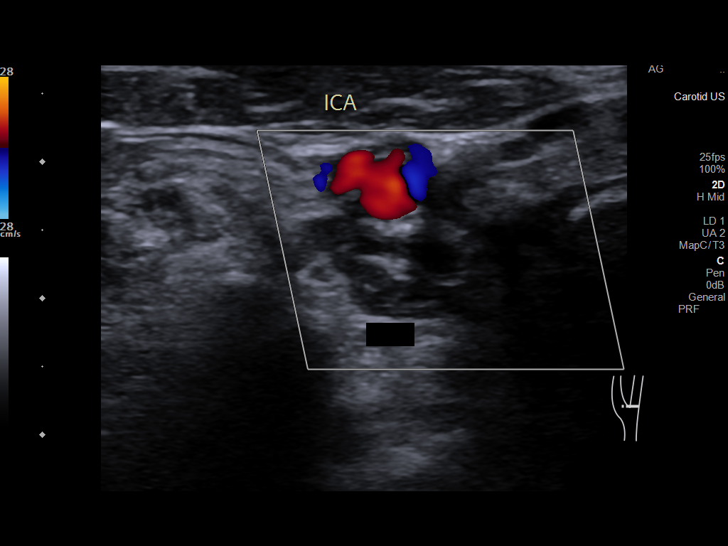
[im 26/73]
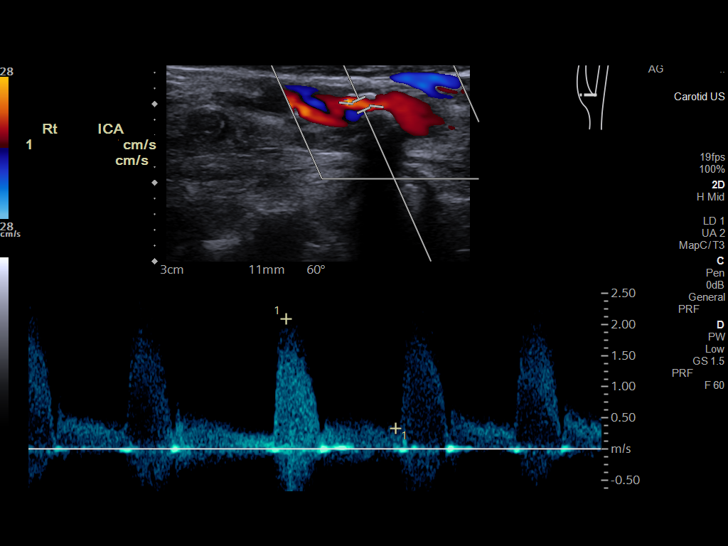
[im 32/73]
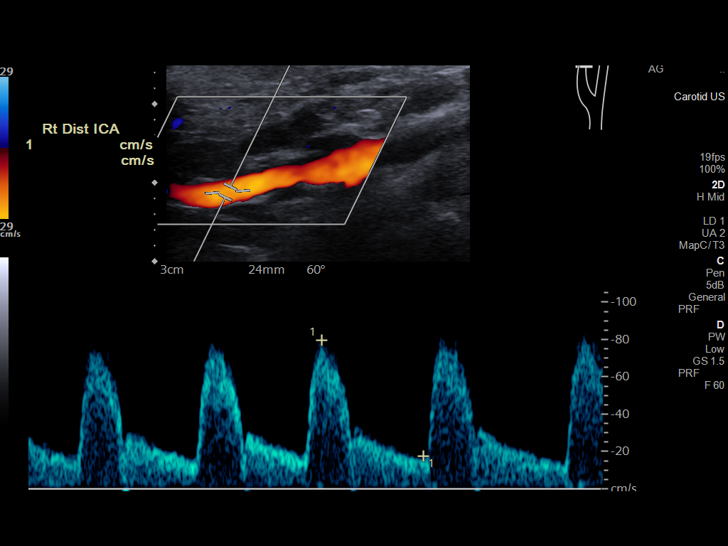
[im 38/73]
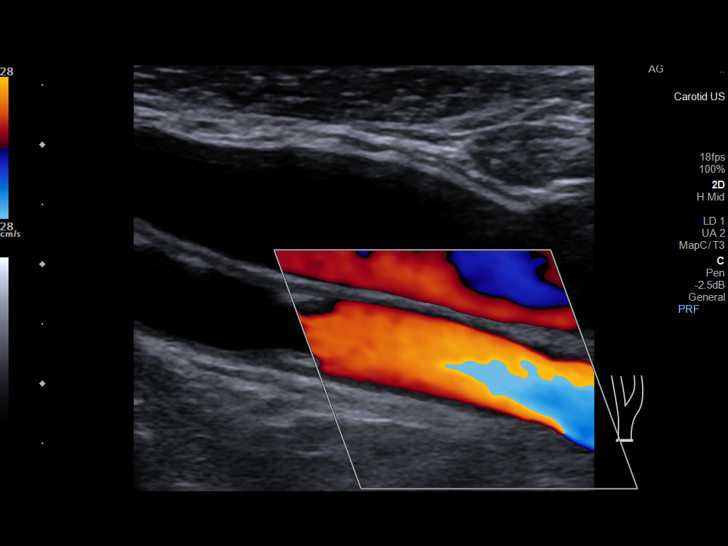
[im 41/73]
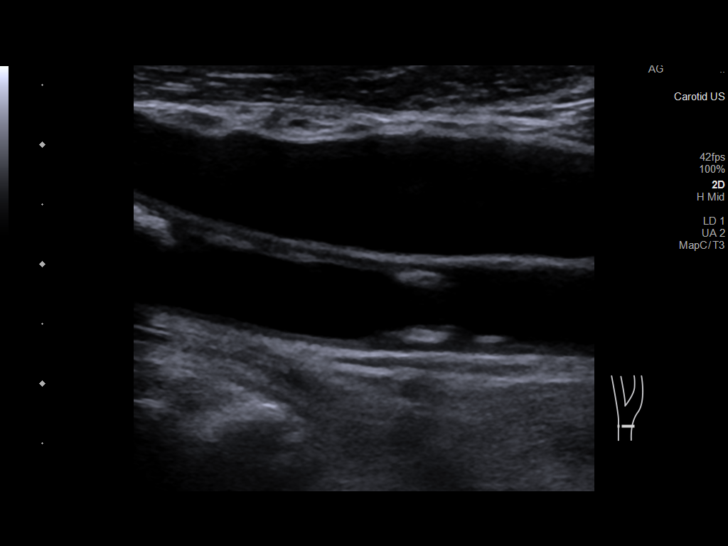
[im 47/73]
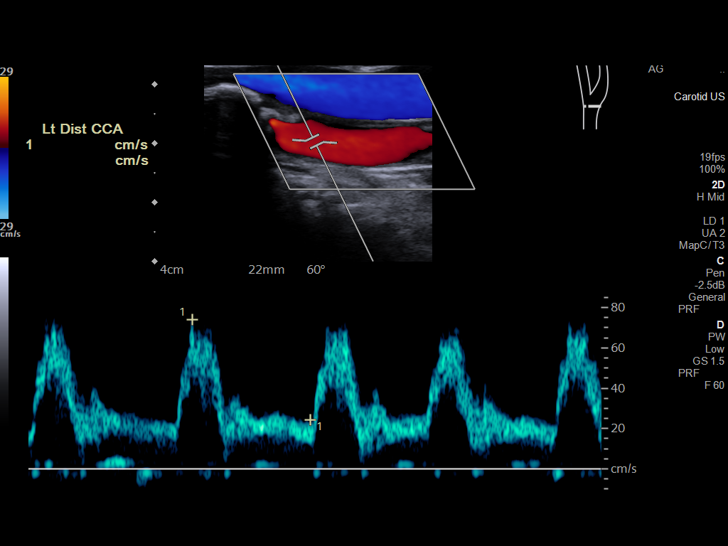
[im 54/73]
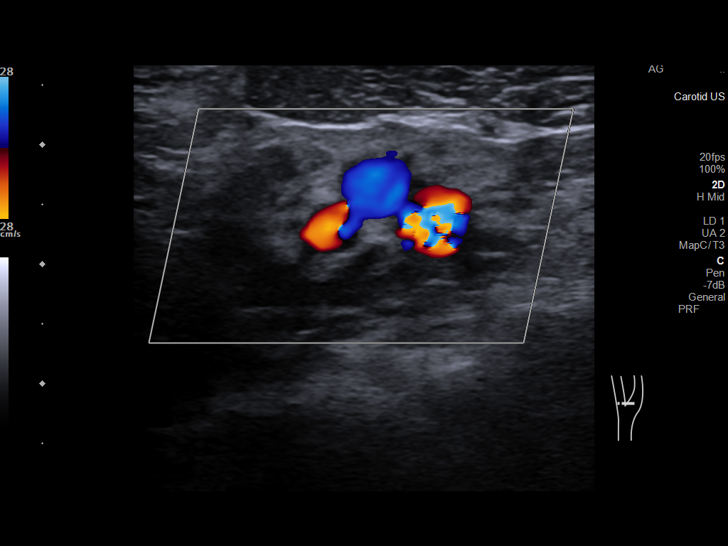
[im 60/73]
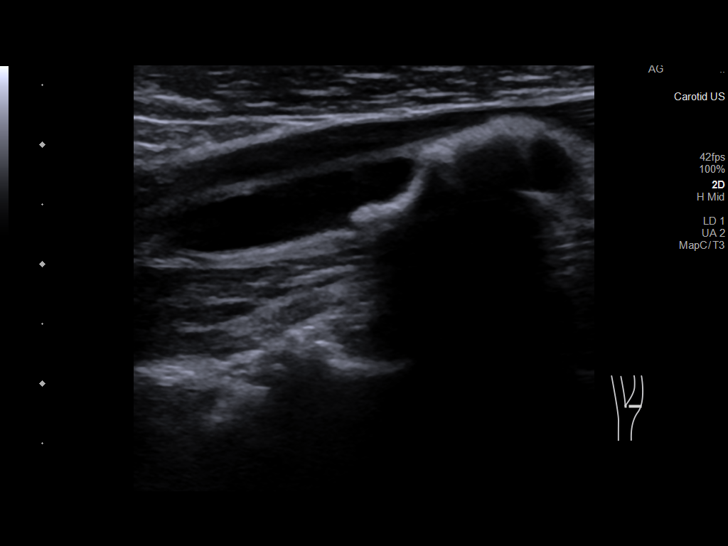
[im 66/73]
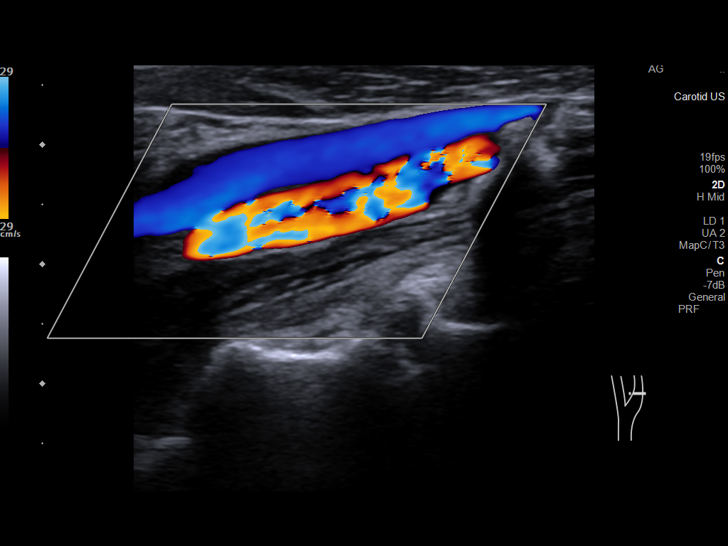
[im 73/73]
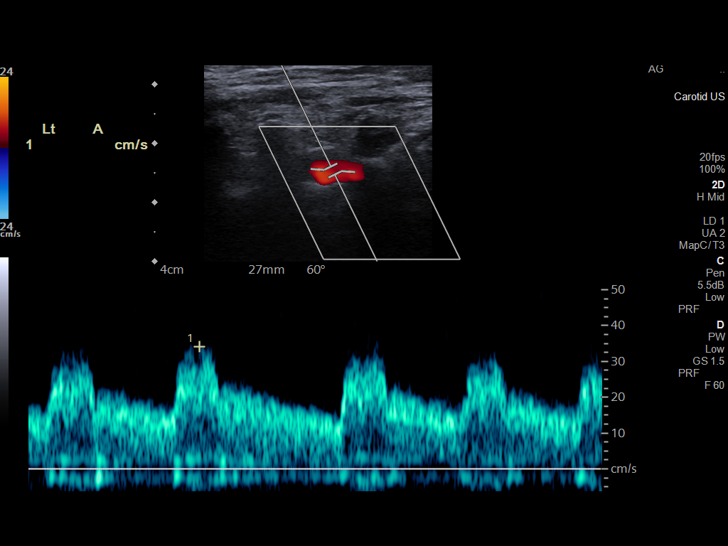

[13 of 24 positions shown; findings below may reference images not displayed]

FINDINGS: Criteria: Quantification of carotid stenosis is based on velocity
parameters that correlate the residual internal carotid diameter
with NASCET-based stenosis levels, using the diameter of the distal
internal carotid lumen as the denominator for stenosis measurement.

The following velocity measurements were obtained:

RIGHT

ICA: 223/18 cm/sec

CCA: 56/8 cm/sec

SYSTOLIC ICA/CCA RATIO:

ECA: 0 cm/sec

LEFT

ICA: 423/122 cm/sec

CCA: 74/25 cm/sec

SYSTOLIC ICA/CCA RATIO:

ECA: 328 cm/sec

RIGHT CAROTID ARTERY: Extensive heterogeneous atheromatous plaque
seen at the carotid bifurcation.

RIGHT VERTEBRAL ARTERY:  Antegrade flow.

LEFT CAROTID ARTERY: Extensive heterogeneous atheromatous plaque
present at the carotid bifurcation.

LEFT VERTEBRAL ARTERY:  Antegrade flow.
IMPRESSION: 1. Greater than 70% stenosis of the left internal carotid artery
with extensive heterogeneous plaque of the bifurcation.
2. 50-69% stenosis of the right internal carotid artery with
extensive heterogeneous plaque at the carotid bifurcation.
3. Occluded right external carotid artery.
4. Further evaluation with CT angiography of the neck should be
performed to better quantify degree of stenosis.

These results will be called to the ordering clinician or
representative by the Radiologist Assistant, and communication
documented in the PACS or [REDACTED].

## 2023-02-15 ENCOUNTER — Encounter: Payer: Self-pay | Admitting: Gastroenterology

## 2023-02-15 NOTE — Progress Notes (Addendum)
[facility] Pancreatic Fluid analysis  Cytology Abundant extracellular mucin without epithelial elements. 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  Specific KRAS and GNAS testing is still pending.  This is thus suggestive of a premalignant lesion.  I think consideration of surgical consultation is reasonable, but patient is hesitant about this.  He is more interested in active surveillance at this point.  We will have the patient added on for multidisciplinary conference discussion in the next 2 to 4 weeks. Tentatively we will consider surgical referral. We will tentatively consider an MRI/MRCP in 3 to 6 months but I want to wait for the pancreatic fluid analysis to come back and for discussion at [facility].  I will update the patient's primary gastroenterologist.  Corliss Parish, MD [facility] Advanced Endoscopy Office # [phone]

## 2023-02-16 ENCOUNTER — Telehealth: Payer: Self-pay | Admitting: Gastroenterology

## 2023-02-16 ENCOUNTER — Other Ambulatory Visit: Payer: Self-pay

## 2023-02-16 DIAGNOSIS — K8689 Other specified diseases of pancreas: Secondary | ICD-10-CM

## 2023-02-16 NOTE — Telephone Encounter (Signed)
My apologies Patty. [id number] to be obtained. GM

## 2023-02-16 NOTE — Telephone Encounter (Signed)
Inbound call from patient's wife regarding lab orders. Patient's wife requesting a call back. Please advise, thank you.

## 2023-02-16 NOTE — Telephone Encounter (Signed)
Inbound call from patient's wife providing the fax number of [phone].

## 2023-02-16 NOTE — Telephone Encounter (Signed)
I have faxed the order to the number provided by the pt wife.

## 2023-02-16 NOTE — Telephone Encounter (Signed)
The pt wife will call back with the fax number for the lab they would like to go too.  Order entered

## 2023-02-16 NOTE — Telephone Encounter (Signed)
Dr Meridee Score this pt wife called asking if lab orders had been entered. I do not have any information on what is needed. Please advise

## 2023-02-17 ENCOUNTER — Other Ambulatory Visit: Payer: Self-pay | Admitting: Gastroenterology

## 2023-02-17 DIAGNOSIS — K8689 Other specified diseases of pancreas: Secondary | ICD-10-CM | POA: Diagnosis not present

## 2023-02-18 LAB — CANCER ANTIGEN 19-9: CA 19-9: 101 U/mL — ABNORMAL HIGH (ref 0–35)

## 2023-02-21 NOTE — Progress Notes (Signed)
Thanks. GM 

## 2023-02-25 DIAGNOSIS — K860 Alcohol-induced chronic pancreatitis: Secondary | ICD-10-CM | POA: Diagnosis not present

## 2023-02-25 DIAGNOSIS — K862 Cyst of pancreas: Secondary | ICD-10-CM | POA: Diagnosis not present

## 2023-02-28 DIAGNOSIS — M2022 Hallux rigidus, left foot: Secondary | ICD-10-CM | POA: Diagnosis not present

## 2023-02-28 DIAGNOSIS — M79672 Pain in left foot: Secondary | ICD-10-CM | POA: Diagnosis not present

## 2023-03-01 ENCOUNTER — Other Ambulatory Visit: Payer: Self-pay

## 2023-03-01 NOTE — Progress Notes (Signed)
The proposed treatment discussed in conference is for discussion purpose only and is not a binding recommendation.  The patients have not been physically examined, or presented with their treatment options.  Therefore, final treatment plans cannot be decided.  

## 2023-03-04 ENCOUNTER — Encounter: Payer: Self-pay | Admitting: Gastroenterology

## 2023-03-04 NOTE — Progress Notes (Signed)
Patient's case was discussed at multidisciplinary conference this week. His imaging and his EUS findings and pancreatic fluid analysis was discussed. 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 Due to the premalignant status of this lesion and size, we discussed potential surgical referral even though patient had previously said he would want to do surveillance.  Here is the plan: 1) placed referral to Dr. Freida Busman or Dr. Donell Beers for discussion of potential surgical options 2) pending patient defers on surgical options, we will perform an MRI/MRCP in 4 to 6 months

## 2023-03-06 ENCOUNTER — Other Ambulatory Visit: Payer: Self-pay

## 2023-03-06 NOTE — Progress Notes (Signed)
The referral has been made to [facility] with records faxed

## 2023-03-07 NOTE — Progress Notes (Signed)
Thanks GM.  Mindy please place this patient on recall for MRI/MRCP in 4-6 months. Thank you

## 2023-03-07 NOTE — Progress Notes (Signed)
Added to recall

## 2023-03-08 ENCOUNTER — Encounter: Payer: Self-pay | Admitting: Internal Medicine

## 2023-03-08 ENCOUNTER — Ambulatory Visit (INDEPENDENT_AMBULATORY_CARE_PROVIDER_SITE_OTHER): Payer: PPO | Admitting: Internal Medicine

## 2023-03-08 VITALS — BP 93/63 | HR 91 | Temp 97.5°F | Ht 68.0 in | Wt 108.6 lb

## 2023-03-08 DIAGNOSIS — R531 Weakness: Secondary | ICD-10-CM | POA: Diagnosis not present

## 2023-03-08 DIAGNOSIS — E222 Syndrome of inappropriate secretion of antidiuretic hormone: Secondary | ICD-10-CM | POA: Insufficient documentation

## 2023-03-08 DIAGNOSIS — K8689 Other specified diseases of pancreas: Secondary | ICD-10-CM | POA: Diagnosis not present

## 2023-03-08 DIAGNOSIS — Z79899 Other long term (current) drug therapy: Secondary | ICD-10-CM

## 2023-03-08 DIAGNOSIS — Z5181 Encounter for therapeutic drug level monitoring: Secondary | ICD-10-CM

## 2023-03-08 DIAGNOSIS — R5383 Other fatigue: Secondary | ICD-10-CM | POA: Diagnosis not present

## 2023-03-08 NOTE — Progress Notes (Signed)
Referring Provider: Elfredia Nevins, MD Primary Care Physician:  Elfredia Nevins, MD Primary GI:  Dr. Marletta Lor  Chief Complaint  Patient presents with   Follow-up    Patient here today for a post procedural visit, and to follow up on pancreatic duct dilation. Patient to see [facility] surgery next week, regarding a premalignant place on his pancreas per patient wife.      HPI:   Todd [name] is a 72 y.o. male who presents to clinic today for follow-up visit.  Admitted to [facility] 10/20/22 for acute GI bleeding and acute blood loss anemia.  Upper endoscopy showed a pyloric ulcer, clean-based, did not require intervention.  Biopsies negative for H. pylori.  Ulcer likely NSAID use as patient notes using headache powders frequently.  Previously multiple times a day, now using once a day.  Discharged on pantoprazole daily.    Underwent colonoscopy during hospitalization unfortunately was poor prep.  Last colonoscopy 2009.  No family history of colorectal malignancy.  Patient states he is been doing well since getting out of the hospital.  No melena hematochezia.  Has recent blood work on his phone which showed a Laveda Norman sat of 82%.  He had a ferritin of over thousand in 2011 though I do not see a repeat.  CT angio abdomen pelvis 10/17/22 showed stenoses involving celiac trunk, SMA, IMA.  Patient seen by vascular who recommended continue monitoring given lack of symptoms related to chronic mesenteric ischemia.  CT also showed diffuse calcifications throughout the pancreas as well as dilatation of the main pancreatic duct possible cystic structure in the pancreatic head.  Patient notes significant history of alcohol abuse years ago.  Now drinks a 1-2 beers each Sunday during the [employer] race.  MRI/MRCP 12/01/22 showed large complex multiseptated cystic lesion of the head of the pancreas with marked dilatation of the pancreatic duct.  EGD/EUS 02/07/2023: Angiodysplasia in the  stomach treated with APC.  Scar in prepyloric region of the stomach from previous ulcer.  EUS with pancreatic atrophy in the head body and pancreatic tail.  Dilated pancreatic duct with intraductal stone.  Multicystic lesion in the pancreatic head with FNA.  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  Patient's case further discussed in multidisciplinary conference, see below:  Patient's case was discussed at multidisciplinary conference this week. His imaging and his EUS findings and pancreatic fluid analysis was discussed. 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 Due to the premalignant status of this lesion and size, we discussed potential surgical referral even though patient had previously said he would want to do surveillance.   Here is the plan: 1) placed referral to Dr. Freida Busman or Dr. Donell Beers for discussion of potential surgical options 2) pending patient defers on surgical options, we will perform an MRI/MRCP in 4 to 6 months  Today, continues to complain of poor appetite, weight loss, generalized weakness/fatigue.  Denies any diarrhea but does state that his stools have changed to light tan color, 2-3 bowel movements a day.  Previously was having 1 bowel movement per day on average.  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   History of GI bleed    Hypothyroidism    Internal hemorrhoids    Colonoscopy 11/09   Osteoarthritis    Peripheral vascular disease (HCC)    Schatzki's ring    EGD 11/09    Past Surgical History:  Procedure Laterality Date   ABDOMINAL AORTOGRAM N/A  03/08/2019   Procedure: ABDOMINAL AORTOGRAM;  Surgeon: Sherren Kerns, MD;  [facility]: [facility];  Service: Cardiovascular;  Laterality: N/A;   ABDOMINAL AORTOGRAM W/LOWER EXTREMITY N/A 03/02/2021   Procedure: ABDOMINAL AORTOGRAM W/LOWER EXTREMITY;  Surgeon: Nada Libman, MD;  [facility]: [facility];  Service: Cardiovascular;  Laterality: N/A;   APPLICATION OF WOUND VAC Right 07/22/2021   Procedure: APPLICATION OF WOUND VAC;  Surgeon: Nada Libman, MD;  Location: [facility] OR;  Service: Vascular;  Laterality: Right;   BIOPSY  10/20/2022   Procedure: BIOPSY;  Surgeon: Lanelle Bal, DO;  [facility]: [facility];  Service: Endoscopy;;   COLONOSCOPY WITH PROPOFOL N/A 10/20/2022   Procedure: COLONOSCOPY WITH PROPOFOL;  Surgeon: Lanelle Bal, DO;  [facility];  Service: Endoscopy;  Laterality: N/A;   COLONOSCOPY WITH PROPOFOL N/A 12/09/2022   Procedure: COLONOSCOPY WITH PROPOFOL;  Surgeon: Lanelle Bal, DO;  [facility]: [facility];  Service: Endoscopy;  Laterality: N/A;  10:15 AM, ASA 3   ENDARTERECTOMY Left 02/02/2022   Procedure: LEFT ENDARTERECTOMY CAROTID;  Surgeon: Nada Libman, MD;  [facility]: [facility];  Service: Vascular;  Laterality: Left;   ESOPHAGOGASTRODUODENOSCOPY (EGD) WITH PROPOFOL N/A 10/20/2022   Procedure: ESOPHAGOGASTRODUODENOSCOPY (EGD) WITH PROPOFOL;  Surgeon: Lanelle Bal, DO;  [facility];  Service: Endoscopy;  Laterality: N/A;   ESOPHAGOGASTRODUODENOSCOPY (EGD) WITH PROPOFOL N/A 02/07/2023   Procedure: ESOPHAGOGASTRODUODENOSCOPY (EGD) WITH PROPOFOL;  Surgeon: Meridee Score Netty Starring., MD;  Location: [facility];  Service: Gastroenterology;  Laterality: N/A;   EUS N/A 02/07/2023   Procedure: UPPER ENDOSCOPIC ULTRASOUND (EUS) RADIAL;  Surgeon: Lemar Lofty., MD;  Location: [facility];  Service: Gastroenterology;  Laterality: N/A;   Excision of gouty lesion     Left index finger   FEMORAL-POPLITEAL BYPASS GRAFT Right  03/12/2021   Procedure: RIGHT FEMORAL TO POPLITEAL ARTERY BYPASS GRAFTING USING THE PROPATEN GRAFT;  Surgeon: Nada Libman, MD;  [facility]: [facility];  Service: Vascular;  Laterality: Right;   FINE NEEDLE ASPIRATION N/A 02/07/2023   Procedure: FINE NEEDLE ASPIRATION (FNA) LINEAR;  Surgeon: Lemar Lofty., MD;  Location: [facility];  Service: Gastroenterology;  Laterality: N/A;   GROIN DEBRIDEMENT Right 07/22/2021   Procedure: RIGHT GROIN DEBRIDEMENT;  Surgeon: Nada Libman, MD;  Location: [facility] OR;  Service: Vascular;  Laterality: Right;   HOT HEMOSTASIS N/A 02/07/2023   Procedure: HOT HEMOSTASIS (ARGON PLASMA COAGULATION/BICAP);  Surgeon: Lemar Lofty., MD;  Location: [facility];  Service: Gastroenterology;  Laterality: N/A;   INSERTION OF ILIAC STENT Right 03/12/2021   Procedure: INSERTION OF  EXTERNAL ILIAC STENT;  Surgeon: Nada Libman, MD;  [facility];  Service: Vascular;  Laterality: Right;   LOWER EXTREMITY ANGIOGRAM Right 03/12/2021   Procedure: LOWER EXTREMITY ANGIOGRAM;  Surgeon: Nada Libman, MD;  [facility];  Service: Vascular;  Laterality: Right;   LOWER EXTREMITY ANGIOGRAPHY Bilateral 03/08/2019   Procedure: Lower Extremity Angiography;  Surgeon: Sherren Kerns, MD;  Location: [facility]  INVASIVE CV LAB;  Service: Cardiovascular;  Laterality: Bilateral;   LOWER EXTREMITY ANGIOGRAPHY N/A 03/15/2021   Procedure: LOWER EXTREMITY ANGIOGRAPHY;  Surgeon: Maeola Harman, MD;  Location: [facility] INVASIVE CV LAB;  Service: Cardiovascular;  Laterality: N/A;   MASS EXCISION Right 07/07/2020   Procedure: EXCISION OF RIGHT FACIAL MASS;  Surgeon: Newman Pies, MD;  Location: [facility];  Service: ENT;  Laterality: Right;   PATCH ANGIOPLASTY Left 02/02/2022   Procedure: PATCH ANGIOPLASTY OF LEFT CAROTID USING XENOSURE BOVINE PERICARDIUM PATCH;  Surgeon: Nada Libman, MD;  [facility];  Service: Vascular;  Laterality: Left;   PERIPHERAL VASCULAR  BALLOON ANGIOPLASTY Right 03/02/2021   Procedure: PERIPHERAL VASCULAR BALLOON ANGIOPLASTY;  Surgeon: Nada Libman, MD;  [facility];  Service: Cardiovascular;  Laterality: Right;  external iliac   PERIPHERAL VASCULAR INTERVENTION Bilateral 03/08/2019   Procedure: PERIPHERAL VASCULAR INTERVENTION;  Surgeon: Sherren Kerns, MD;  [facility]: [facility];  Service: Cardiovascular;  Laterality: Bilateral;  ext iliac artery stent   PERIPHERAL VASCULAR INTERVENTION Left 03/02/2021   Procedure: PERIPHERAL VASCULAR INTERVENTION;  Surgeon: Nada Libman, MD;  [facility]: [facility];  Service: Cardiovascular;  Laterality: Left;  external iliac   PERIPHERAL VASCULAR INTERVENTION Right 03/15/2021   Procedure: PERIPHERAL VASCULAR INTERVENTION;  Surgeon: Maeola Harman, MD;  Location: [facility] INVASIVE CV LAB;  Service: Cardiovascular;  Laterality: Right;  external iliac   POLYPECTOMY  12/09/2022   Procedure: POLYPECTOMY INTESTINAL;  Surgeon: Lanelle Bal, DO;  [facility];  Service: Endoscopy;;   Right knee arthroscopy     SPINE SURGERY      Current Outpatient Medications  Medication Sig Dispense Refill   aspirin EC 81 MG EC tablet Take 1 tablet (81 mg total) by mouth daily at 6 (six) AM. Swallow whole. 30 tablet 11   Cholecalciferol (VITAMIN D) 50 MCG (2000 UT) tablet Take 2,000 Units by mouth daily.     clopidogrel (PLAVIX) 75 MG tablet Take 1 tablet (75 mg total) by mouth daily. 30 tablet 0   levothyroxine (SYNTHROID) 25 MCG tablet Take 25 mcg by mouth daily before breakfast.     Nutritional Supplements (ENSURE PO) Take 1 Container by mouth daily.     pantoprazole (PROTONIX) 40 MG tablet Take 1 tablet (40 mg total) by mouth 2 (two) times daily. 60 tablet 11   rosuvastatin (CRESTOR) 10 MG tablet TAKE (1) TABLET BY MOUTH ONCE DAILY. 30 tablet 0   No current facility-administered medications for this visit.    Allergies as of 03/08/2023   (No Known  Allergies)    Family History  Problem Relation Age of Onset   Coronary artery disease Father    Coronary artery disease Sister        MI at age 30    Social History   Socioeconomic History   Marital status: Married    Spouse name: Not on file   Number of children: 3   Years of education: Not on file   Highest education level: Not on file  Occupational History   Occupation: Retired    Associate Professor: [employer]  Tobacco Use   Smoking status: Every Day    Current packs/day: 0.50    Types: Cigarettes    Passive exposure: Current   Smokeless tobacco: Never   Tobacco comments:    0.25-0.5 packs per day  Vaping Use   Vaping status: Never Used  Substance and Sexual Activity  Alcohol use: Yes    Alcohol/week: 5.0 standard drinks of alcohol    Types: 5 Cans of beer per week    Comment: less than a 6 pack of beer per week   Drug use: No   Sexual activity: Not Currently  Other Topics Concern   Not on file  Social History Narrative   Not on file   Social Determinants of Health   Financial Resource Strain: Not on file  Food Insecurity: No Food Insecurity (10/17/2022)   Hunger Vital Sign    Worried About Running Out of Food in the Last Year: Never true    Ran Out of Food in the Last Year: Never true  Transportation Needs: No Transportation Needs (10/17/2022)   PRAPARE - Administrator, Civil Service (Medical): No    Lack of Transportation (Non-Medical): No  Physical Activity: Not on file  Stress: Not on file  Social Connections: Not on file    Subjective: Review of Systems  Constitutional:  Positive for malaise/fatigue and weight loss. Negative for chills and fever.  HENT:  Negative for congestion and hearing loss.   Eyes:  Negative for blurred vision and double vision.  Respiratory:  Negative for cough and shortness of breath.   Cardiovascular:  Negative for chest pain and palpitations.  Gastrointestinal:  Negative for abdominal pain, blood in stool,  constipation, diarrhea, heartburn, melena and vomiting.  Genitourinary:  Negative for dysuria and urgency.  Musculoskeletal:  Negative for joint pain and myalgias.  Skin:  Negative for itching and rash.  Neurological:  Negative for dizziness and headaches.  Psychiatric/Behavioral:  Negative for depression. The patient is not nervous/anxious.      Objective: BP 93/63 (BP Location: Left Arm, Patient Position: Sitting, Cuff Size: Normal)   Pulse 91   Temp (!) 96.6 F (35.9 C) (Temporal)   Ht 5\' 8"  (1.727 m)   Wt 108 lb 9.6 oz (49.3 kg)   BMI 16.51 kg/m  Physical Exam Constitutional:      Appearance: Normal appearance.  HENT:     Head: Normocephalic and atraumatic.  Eyes:     Extraocular Movements: Extraocular movements intact.     Conjunctiva/sclera: Conjunctivae normal.  Cardiovascular:     Rate and Rhythm: Normal rate and regular rhythm.  Pulmonary:     Effort: Pulmonary effort is normal.     Breath sounds: Normal breath sounds.  Abdominal:     General: Bowel sounds are normal.     Palpations: Abdomen is soft.  Musculoskeletal:        General: Normal range of motion.     Cervical back: Normal range of motion and neck supple.  Skin:    General: Skin is warm.  Neurological:     General: No focal deficit present.     Mental Status: He is alert and oriented to person, place, and time.  Psychiatric:        Mood and Affect: Mood normal.        Behavior: Behavior normal.      Assessment/Plan:  1.  Pancreatic mass/pancreatic atrophy-see HPI for further info.  Seeing hepatobiliary surgery next week.  Tentatively plan on repeat MRI/MRCP in 4 months.  Samples of Creon given to patient today, call with update next week if symptoms improve will send in formal prescription.  2.  Weight loss, poor appetite-possible sequela from his pancreatic abnormalities.  Check blood work today.  Counseled on protein supplementation with Ensure twice daily.  Continue to monitor.  3.   Adenomatous colon polyps-colonoscopy recall 2029  Follow-up in 3 months or sooner if needed  03/08/2023 9:57 AM   Disclaimer: This note was dictated with voice recognition software. Similar sounding words can inadvertently be transcribed and may not be corrected upon review.

## 2023-03-08 NOTE — Patient Instructions (Signed)
I am going to order blood work today at [facility] in regard to your generalized weakness/fatigue, use of fluid pills, borderline low temperature today.  We will call with results.  I am going to give you samples of pancreatic enzymes today.  Take 2 capsules with each meal.  Let me know in a week or so if you think it is helping any of your symptoms and I will send in formal prescription.  Will plan on MRI/MRCP in 4 to 6 months.  I will await recommendations from the surgical team who you are seeing next week.  Follow-up with me in 3 months.  It was very nice seeing both you again today.  Dr. Marletta Lor

## 2023-03-09 LAB — COMPREHENSIVE METABOLIC PANEL
AG Ratio: 0.9 (calc) — ABNORMAL LOW (ref 1.0–2.5)
ALT: 14 U/L (ref 9–46)
AST: 22 U/L (ref 10–35)
Albumin: 3.1 g/dL — ABNORMAL LOW (ref 3.6–5.1)
Alkaline phosphatase (APISO): 67 U/L (ref 35–144)
BUN: 16 mg/dL (ref 7–25)
CO2: 32 mmol/L (ref 20–32)
Calcium: 8.4 mg/dL — ABNORMAL LOW (ref 8.6–10.3)
Chloride: 94 mmol/L — ABNORMAL LOW (ref 98–110)
Creat: 1.25 mg/dL (ref 0.70–1.28)
Globulin: 3.4 g/dL (ref 1.9–3.7)
Glucose, Bld: 119 mg/dL — ABNORMAL HIGH (ref 65–99)
Potassium: 3.4 mmol/L — ABNORMAL LOW (ref 3.5–5.3)
Sodium: 136 mmol/L (ref 135–146)
Total Bilirubin: 0.3 mg/dL (ref 0.2–1.2)
Total Protein: 6.5 g/dL (ref 6.1–8.1)

## 2023-03-09 LAB — CBC
HCT: 36 % — ABNORMAL LOW (ref 38.5–50.0)
Hemoglobin: 11.9 g/dL — ABNORMAL LOW (ref 13.2–17.1)
MCH: 29.8 pg (ref 27.0–33.0)
MCHC: 33.1 g/dL (ref 32.0–36.0)
MCV: 90.2 fL (ref 80.0–100.0)
MPV: 10.5 fL (ref 7.5–12.5)
Platelets: 222 10*3/uL (ref 140–400)
RBC: 3.99 10*6/uL — ABNORMAL LOW (ref 4.20–5.80)
RDW: 14.3 % (ref 11.0–15.0)
WBC: 7.8 10*3/uL (ref 3.8–10.8)

## 2023-03-09 LAB — TSH+FREE T4: TSH W/REFLEX TO FT4: 2.84 m[IU]/L (ref 0.40–4.50)

## 2023-03-15 ENCOUNTER — Other Ambulatory Visit: Payer: Self-pay | Admitting: Internal Medicine

## 2023-03-15 ENCOUNTER — Telehealth: Payer: Self-pay

## 2023-03-15 MED ORDER — PANCRELIPASE (LIP-PROT-AMYL) 36000-114000 UNITS PO CPEP: 72000.0000 [IU] | ORAL_CAPSULE | Freq: Three times a day (TID) | ORAL | 11 refills | Status: DC

## 2023-03-15 NOTE — Telephone Encounter (Signed)
Pt returned call regarding samples Creon that was given last week. Pt wants a formal Rx sent to [facility]. Please advise

## 2023-03-15 NOTE — Telephone Encounter (Signed)
Sent to pharmacy thank you

## 2023-03-17 DIAGNOSIS — D490 Neoplasm of unspecified behavior of digestive system: Secondary | ICD-10-CM | POA: Diagnosis not present

## 2023-03-17 DIAGNOSIS — K8681 Exocrine pancreatic insufficiency: Secondary | ICD-10-CM | POA: Diagnosis not present

## 2023-03-17 DIAGNOSIS — K861 Other chronic pancreatitis: Secondary | ICD-10-CM | POA: Diagnosis not present

## 2023-03-20 ENCOUNTER — Other Ambulatory Visit: Payer: Self-pay | Admitting: Surgery

## 2023-03-20 DIAGNOSIS — D490 Neoplasm of unspecified behavior of digestive system: Secondary | ICD-10-CM

## 2023-03-24 ENCOUNTER — Other Ambulatory Visit: Payer: Self-pay | Admitting: *Deleted

## 2023-03-24 DIAGNOSIS — I6523 Occlusion and stenosis of bilateral carotid arteries: Secondary | ICD-10-CM

## 2023-04-01 ENCOUNTER — Other Ambulatory Visit: Payer: Self-pay | Admitting: Gastroenterology

## 2023-04-02 NOTE — Progress Notes (Unsigned)
Vascular and Vein Specialist of [facility]  Patient name: Todd [name] MRN: [id number] DOB: [birth year] Sex: male   REASON FOR VISIT:    Follow-up  HISOTRY OF PRESENT ILLNESS:    Todd [name] is a 72 y.o. male who is a former patient of Dr. Arbie Cookey.  He has a history of a right femoral-popliteal bypass graft with Gore-Tex in September 2022.  He is status post left carotid endarterectomy for asymptomatic disease in August 2023.  He has a known right carotid occlusion.  He has a known history of a gastric and duodenal ulcer with GI bleed.  He was last seen by Dr. Arbie Cookey in May 2024.  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  This was not felt to be the etiology of his GI bleed.  He is back today for follow-up.  He continues to smoke a half a pack a day.  He is taking his aspirin, statin, and Plavix.  His biggest complaint is leg swelling.  Claudication symptoms are minimal.   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   History of GI bleed    Hypothyroidism    Internal hemorrhoids    Colonoscopy 11/09   Osteoarthritis    Peripheral vascular disease (HCC)    Schatzki's ring    EGD 11/09     FAMILY HISTORY:   Family History  Problem Relation Age of Onset   Coronary artery disease Father    Coronary artery disease Sister        MI at age 48    SOCIAL HISTORY:   Social History   Tobacco Use   Smoking status: Every Day    Current packs/day: 0.50    Types: Cigarettes    Passive exposure: Current   Smokeless tobacco:  Never   Tobacco comments:    0.25-0.5 packs per day  Substance Use Topics   Alcohol use: Yes    Alcohol/week: 5.0 standard drinks of alcohol    Types: 5 Cans of beer per week    Comment: less than a 6 pack of beer per week     ALLERGIES:   No Known Allergies   CURRENT MEDICATIONS:   Current Outpatient Medications  Medication Sig Dispense Refill   aspirin EC 81 MG EC tablet Take 1 tablet (81 mg total) by mouth daily at 6 (six) AM. Swallow whole. 30 tablet 11   Cholecalciferol (VITAMIN D) 50 MCG (2000 UT) tablet Take 2,000 Units by mouth daily.     clopidogrel (PLAVIX) 75 MG tablet Take 1 tablet (75 mg total) by mouth daily. 30 tablet 0   diclofenac Sodium (VOLTAREN) 1 % GEL SMARTSIG:4 Gram(s) Topical 4 Times Daily PRN     furosemide (LASIX) 20 MG tablet Take 20-60 mg by mouth daily as needed.     levothyroxine (SYNTHROID) 25 MCG tablet Take 25 mcg by mouth daily before breakfast.     lipase/protease/amylase (CREON) 36000 UNITS CPEP capsule Take  2 capsules (72,000 Units total) by mouth 3 (three) times daily before meals. And 1 capsule with snacks 240 capsule 11   Nutritional Supplements (ENSURE PO) Take 1 Container by mouth daily.     pantoprazole (PROTONIX) 40 MG tablet Take 1 tablet (40 mg total) by mouth 2 (two) times daily. 60 tablet 11   rosuvastatin (CRESTOR) 10 MG tablet TAKE (1) TABLET BY MOUTH ONCE DAILY. 30 tablet 0   No current facility-administered medications for this visit.    REVIEW OF SYSTEMS:   [X]  denotes positive finding, [ ]  denotes negative finding Cardiac  Comments:  Chest pain or chest pressure:    Shortness of breath upon exertion:    Short of breath when lying flat:    Irregular heart rhythm:        Vascular    Pain in calf, thigh, or hip brought on by ambulation:    Pain in feet at night that wakes you up from your sleep:     Blood clot in your veins:    Leg swelling:  x       Pulmonary    Oxygen at home:    Productive cough:     Wheezing:          Neurologic    Sudden weakness in arms or legs:     Sudden numbness in arms or legs:     Sudden onset of difficulty speaking or slurred speech:    Temporary loss of vision in one eye:     Problems with dizziness:         Gastrointestinal    Blood in stool:     Vomited blood:         Genitourinary    Burning when urinating:     Blood in urine:        Psychiatric    Major depression:         Hematologic    Bleeding problems:    Problems with blood clotting too easily:        Skin    Rashes or ulcers:        Constitutional    Fever or chills:      PHYSICAL EXAM:   Vitals:   04/03/23 0819 04/03/23 0822  BP: 120/78 129/82  Pulse: 93   Resp: 20   Temp: 98.3 F (36.8 C)   SpO2: 94%   Weight: 127 lb 6.4 oz (57.8 kg)   Height: 5\' 8"  (1.727 m)     GENERAL: The patient is a well-nourished male, in no acute distress. The vital signs are documented above. CARDIAC: There is a regular rate and rhythm.  VASCULAR: 2+ bilateral leg edema PULMONARY: Non-labored respirations ABDOMEN: Soft and non-tender with normal pitched bowel sounds.  MUSCULOSKELETAL: There are no major deformities or cyanosis. NEUROLOGIC: No focal weakness or paresthesias are detected. SKIN: There are no ulcers or rashes noted. PSYCHIATRIC: The patient has a normal affect.  STUDIES:   I have reviewed the following duplex:  Right Carotid: Evidence consistent with a total occlusion of the right  ICA. The                 ECA appears >50% stenosed.   Left Carotid: Patent CEA with not stenosis.   Vertebrals:  Bilateral vertebral arteries demonstrate antegrade flow.  Subclavians: Normal flow hemodynamics were seen in bilateral subclavian               arteries.   MEDICAL ISSUES:  Carotid: Known right sided occlusion.  The left side is widely patent.  He is asymptomatic.  He will need repeat imaging in October 2025  PAD: Bypass graft duplex 6 months ago shows his graft to be widely patent.  I  will get a repeat duplex in 6 months  Edema: He will be fitted for 15-20 knee-high compression socks.  He is encouraged to keep his legs elevated.     Charlena Cross, MD, FACS [facility] [phone] Pager [phone]

## 2023-04-03 ENCOUNTER — Ambulatory Visit (HOSPITAL_COMMUNITY)
Admission: RE | Admit: 2023-04-03 | Discharge: 2023-04-03 | Disposition: A | Discharge status: Home / Self Care | Payer: PPO | Source: Ambulatory Visit | Attending: Surgery | Admitting: Surgery

## 2023-04-03 ENCOUNTER — Ambulatory Visit: Payer: PPO | Admitting: Surgery

## 2023-04-03 ENCOUNTER — Encounter: Payer: Self-pay | Admitting: Surgery

## 2023-04-03 VITALS — BP 129/82 | HR 93 | Temp 98.3°F | Resp 20 | Ht 68.0 in | Wt 127.4 lb

## 2023-04-03 DIAGNOSIS — I739 Peripheral vascular disease, unspecified: Secondary | ICD-10-CM

## 2023-04-03 DIAGNOSIS — I6523 Occlusion and stenosis of bilateral carotid arteries: Secondary | ICD-10-CM | POA: Insufficient documentation

## 2023-04-03 DIAGNOSIS — Z95828 Presence of other vascular implants and grafts: Secondary | ICD-10-CM | POA: Diagnosis not present

## 2023-04-03 DIAGNOSIS — M7989 Other specified soft tissue disorders: Secondary | ICD-10-CM

## 2023-04-26 ENCOUNTER — Encounter: Payer: Self-pay | Admitting: Cardiology

## 2023-04-26 ENCOUNTER — Ambulatory Visit: Payer: PPO | Attending: Cardiology | Admitting: Cardiology

## 2023-04-26 VITALS — BP 120/80 | HR 52 | Ht 68.0 in | Wt 133.6 lb

## 2023-04-26 DIAGNOSIS — I739 Peripheral vascular disease, unspecified: Secondary | ICD-10-CM

## 2023-04-26 DIAGNOSIS — I4821 Permanent atrial fibrillation: Secondary | ICD-10-CM

## 2023-04-26 DIAGNOSIS — R600 Localized edema: Secondary | ICD-10-CM

## 2023-04-26 DIAGNOSIS — Z720 Tobacco use: Secondary | ICD-10-CM | POA: Diagnosis not present

## 2023-04-26 DIAGNOSIS — I4891 Unspecified atrial fibrillation: Secondary | ICD-10-CM | POA: Diagnosis not present

## 2023-04-26 NOTE — Patient Instructions (Signed)
Medication Instructions:  Your physician recommends that you continue on your current medications as directed. Please refer to the Current Medication list given to you today.   Labwork: None today   Testing/Procedures: Your physician has requested that you have an echocardiogram. Echocardiography is a painless test that uses sound waves to create images of your heart. It provides your doctor with information about the size and shape of your heart and how well your heart's chambers and valves are working. This procedure takes approximately one hour. There are no restrictions for this procedure. Please do NOT wear cologne, perfume, aftershave, or lotions (deodorant is allowed). Please arrive 15 minutes prior to your appointment time.   Follow-Up: 6 months  Any Other Special Instructions Will Be Listed Below (If Applicable).  If you need a refill on your cardiac medications before your next appointment, please call your pharmacy.  

## 2023-04-26 NOTE — Progress Notes (Signed)
Cardiology Office Note  Date: 04/26/2023   ID: COLESTON, SARFF 05-05-1951, MRN [id number]  History of Present Illness: Todd [name] is a 72 y.o. male last seen in February by Ms. Ingold NP, I reviewed the note (our last visit was in 2021).  He does not report any exertional chest pain or sense of palpitations.    Most recently he has been undergoing evaluation at [facility] due to weight loss and pancreatic cyst, pancreatic insufficiency, and PD dilatation as well as elevated CA 19-9.  He had an obstructing stone in his pancreatic head as well.  Currently has improved clinically on Creon, gaining weight back and feels better.  Follow-up MRI is planned in December.  At this point no surgery is planned.  I reviewed his medications.  Current regimen includes aspirin, Plavix, Lasix, and Crestor.  He continues to follow with [facility].  I reviewed his recent carotid Dopplers.  He also complains of bilateral leg edema, no orthopnea or PND.  He has been using low-dose Lasix as well as compression stockings below the knees.  No recent echocardiogram.  Physical Exam: VS:  BP 120/80 (BP Location: Left Arm, Patient Position: Sitting, Cuff Size: Normal)   Pulse (!) 52   Ht 5\' 8"  (1.727 m)   Wt 133 lb 9.6 oz (60.6 kg)   SpO2 95%   BMI 20.31 kg/m , BMI Body mass index is 20.31 kg/m.  Wt Readings from Last 3 Encounters:  04/26/23 133 lb 9.6 oz (60.6 kg)  04/03/23 127 lb 6.4 oz (57.8 kg)  03/08/23 108 lb 9.6 oz (49.3 kg)    General: Patient appears comfortable at rest. HEENT: Conjunctiva and lids normal. Neck: Supple, no elevated JVP or carotid bruits. Lungs: Decreased breath sounds, nonlabored breathing at rest. Cardiac: Irregularly irregular without gallop. Extremities: Compression stockings in place bilaterally below the knees.  ECG:  An ECG dated 10/17/2022 was personally reviewed today and demonstrated:  Rate controlled atrial fibrillation with poor R wave progression and low  voltage.  Labwork: 10/19/2022: Magnesium 1.8 03/08/2023: ALT 14; AST 22; BUN 16; Creat 1.25; Hemoglobin 11.9; Platelets 222; Potassium 3.4; Sodium 136  June 2024: Cholesterol 118, triglycerides 55, HDL 68, LDL 37  Other Studies Reviewed Today:  No interval cardiac testing for review today.  Assessment and Plan:  1.  Permanent atrial fibrillation with CHA2DS2-VASc score of 3.  He was taken off anticoagulation in the past given GI bleeding.  Asymptomatic in terms of palpitations and heart rate is controlled in the absence of AV nodal blockers.  We did discuss the possibility of Watchman evaluation, although unlikely to be an optimal candidate and he is not interested in this procedure at this point.  He is currently on aspirin and Plavix in light of PAD.  2.  Mixed hyperlipidemia.  LDL 37 in June.  Continue Crestor.  3.  PAD status post bilateral external iliac artery stenting with follow-up by [facility].  Also history of left CEA.  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  He is on aspirin and Plavix.  4.  Primary hypertension.  Blood pressure is normal today.  He is currently not on any antihypertensive therapy.  5.  Long-term tobacco use.  6.  Bilateral leg edema.  Currently using lower extremity compression stockings and low-dose Lasix.  We will obtain echocardiogram to ensure normal LVEF.  Disposition:  Follow up  6 months.  Signed, Jonelle Sidle, M.D.,  F.A.C.C. [facility]

## 2023-04-28 ENCOUNTER — Other Ambulatory Visit: Payer: Self-pay

## 2023-04-28 DIAGNOSIS — I739 Peripheral vascular disease, unspecified: Secondary | ICD-10-CM

## 2023-05-03 ENCOUNTER — Ambulatory Visit (HOSPITAL_COMMUNITY)
Admission: RE | Admit: 2023-05-03 | Discharge: 2023-05-03 | Disposition: A | Discharge status: Home / Self Care | Payer: PPO | Source: Ambulatory Visit | Attending: Cardiology | Admitting: Cardiology

## 2023-05-03 DIAGNOSIS — I498 Other specified cardiac arrhythmias: Secondary | ICD-10-CM

## 2023-05-03 DIAGNOSIS — I4891 Unspecified atrial fibrillation: Secondary | ICD-10-CM | POA: Insufficient documentation

## 2023-05-03 LAB — ECHOCARDIOGRAM COMPLETE
AR max vel: 1.81 cm2
AV Area VTI: 1.67 cm2
AV Area mean vel: 1.63 cm2
AV Mean grad: 2 mm[Hg]
AV Peak grad: 3.5 mm[Hg]
Ao pk vel: 0.93 m/s
Area-P 1/2: 4.17 cm2
S' Lateral: 2.3 cm

## 2023-05-05 ENCOUNTER — Ambulatory Visit
Admission: RE | Admit: 2023-05-05 | Discharge: 2023-05-05 | Disposition: A | Discharge status: Home / Self Care | Payer: PPO | Source: Ambulatory Visit | Attending: Surgery | Admitting: Surgery

## 2023-05-05 ENCOUNTER — Other Ambulatory Visit: Payer: Self-pay | Admitting: Cardiology

## 2023-05-05 DIAGNOSIS — D490 Neoplasm of unspecified behavior of digestive system: Secondary | ICD-10-CM

## 2023-05-05 DIAGNOSIS — K862 Cyst of pancreas: Secondary | ICD-10-CM | POA: Diagnosis not present

## 2023-05-05 MED ORDER — GADOPICLENOL 0.5 MMOL/ML IV SOLN
6.0000 mL | Freq: Once | INTRAVENOUS | Status: AC | PRN
  Administered 2023-05-05: 6 mL via INTRAVENOUS

## 2023-05-09 ENCOUNTER — Telehealth: Payer: Self-pay

## 2023-05-09 DIAGNOSIS — R931 Abnormal findings on diagnostic imaging of heart and coronary circulation: Secondary | ICD-10-CM

## 2023-05-09 NOTE — Telephone Encounter (Signed)
Echo results discussed with patient and he is agreeable to go to Advanced heart failure clinic,referral placed,pcp copied

## 2023-05-09 NOTE — Telephone Encounter (Signed)
-----   Message from Nona Dell sent at 05/05/2023 11:39 AM EST ----- Results reviewed.  Follow-up echocardiogram shows vigorous LVEF at 70 to 75%.  On my review RV appears dilated with mild dysfunction and he has evidence of severe pulmonary hypertension with RVSP estimated 77 mmHg.  Small pericardial effusion and biatrial enlargement also noted.  Lots of possibilities to consider.  He is not anticoagulated for atrial fibrillation in light of prior GI bleeding, chronic thromboembolic disease could be considered.  Uncertain about baseline chronic pulmonary disease history, he does have long-term tobacco use.  Would see if he would be willing to have consultation in the heart failure clinic for workup of pulmonary hypertension, possibly right heart catheterization and additional studies.  If he were to need further procedures for evaluation of his pancreatic status, this workup and information could be useful going forward.

## 2023-05-29 NOTE — H&P (View-Only) (Signed)
 PULMONARY HYPERTENSION NEW PATIENT CLINIC NOTE  Referring Physician: Elfredia Nevins, MD  Primary Care: Elfredia Nevins, MD Primary Cardiologist:  HPI: Todd Todd [name] is a 72 y.o. male with a PMH of PAD s/p right fem-pop bypass, L CEA, GI bleeding, bilateral renal artery stenosis, permanent afib who presents for initial visit for further evaluation and treatment of heart failure/cardiomyopathy.      Patient has an extensive history of peripheral arterial disease including right femoral to popliteal bypass for critical ischemia in 11/22.  He also had a left carotid endarterectomy with bovine patch angioplasty in 2023.  Since his procedure he has had continued right lower extremity swelling, though notes that it has gotten worse in the past few months.  He was hospitalized in April due to issues related to his pancreas.  His wife reports at that time he had extensive lower extremity swelling that responded slowly to daily diuretics.  Given his lower extremity swelling he underwent an echocardiogram on 11/24 that showed normal ejection fraction with elevated PA systolic pressures and so was sent for further evaluation.     SUBJECTIVE: Patient reports that he is having ongoing symptoms of shortness of breath with exertion, noting that he can walk about 3-4 stairs before having to stop.  He reports that this is often multifactorial given that he has claudication in his lower extremities as well.  He follows with vascular surgery for this.  He denies any chest pain, chest pressure, or burning with these episodes.  He does not have any orthopnea or PND.  He has a long smoking history though has never been diagnosed with COPD.  No history of autoimmune diseases.  His father did pass away from heart failure.  PMH, current medications, allergies, social history, and family history reviewed in epic.  PHYSICAL EXAM: Vitals:   05/30/23 0930  BP: 112/78   GENERAL: Chronically ill-appearing.   HEENT: The mucous membranes are pink and moist.   PULM: Normal work of breathing, no significant end expiratory wheezing, diminished breath sounds CARDIAC:  JVP: Not elevated         Irregular rate and rhythm, 1+ lower extremity edema ABDOMEN: Soft, non-tender, non-distended. NEUROLOGIC: Patient is oriented x3 with no focal or lateralizing neurologic deficits.  PSYCH: Patients affect is appropriate, there is no evidence of anxiety or depression.  SKIN: Warm and dry; no lesions or wounds.   Etiology of PH: Presumed group 2/3 WHO Functional class: Todd [name] : Not yet done Volume status:  Mild volume up Pulmonary vasodilators: None Advanced therapies: Not a candidate given PAD  Echo:  11/24: Normal LVEF, LV hypertrophy, severely elevated pulmonary artery systolic pressure.  Severe biatrial enlargement, moderate TR, moderate MR    ASSESSMENT & PLAN:  Presumed pulmonary hypertension: Noted on echocardiogram for lower extremity swelling, patient with severe biatrial enlargement suggesting some element of left heart disease.  Has not been diagnosed with obstructive lung disease but longtime smoker.  Will proceed with right heart cath for evaluation as well as PFTs, if he does not have significant postcapillary pulmonary hypertension will obtain further workup. -Potential level study candidate -Start Farxiga 10 mg daily -Continue daily furosemide 20 mg -Right heart cath -PFTs -Further management based on numbers  Permanent atrial fibrillation: Failed previously attempts at cardioversion, unlikely to achieve sinus rhythm given severe biatrial enlargement.  Not on anticoagulation given not on anticoagulation given history of GI bleeds. - Continue aspirin, Plavix  -Not on BB  PAD: Severe, with ongoing claudication symptoms  bilaterally. 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

## 2023-05-29 NOTE — Progress Notes (Signed)
PULMONARY HYPERTENSION NEW PATIENT CLINIC NOTE  Referring Physician: Elfredia Nevins, MD  Primary Care: Elfredia Nevins, MD Primary Cardiologist:  HPI: Todd [name] is a 72 y.o. male with a PMH of PAD s/p right fem-pop bypass, L CEA, GI bleeding, bilateral renal artery stenosis, permanent afib who presents for initial visit for further evaluation and treatment of heart failure/cardiomyopathy.      Patient has an extensive history of peripheral arterial disease including right femoral to popliteal bypass for critical ischemia in 11/22.  He also had a left carotid endarterectomy with bovine patch angioplasty in 2023.  Since his procedure he has had continued right lower extremity swelling, though notes that it has gotten worse in the past few months.  He was hospitalized in April due to issues related to his pancreas.  His wife reports at that time he had extensive lower extremity swelling that responded slowly to daily diuretics.  Given his lower extremity swelling he underwent an echocardiogram on 11/24 that showed normal ejection fraction with elevated PA systolic pressures and so was sent for further evaluation.     SUBJECTIVE: Patient reports that he is having ongoing symptoms of shortness of breath with exertion, noting that he can walk about 3-4 stairs before having to stop.  He reports that this is often multifactorial given that he has claudication in his lower extremities as well.  He follows with vascular surgery for this.  He denies any chest pain, chest pressure, or burning with these episodes.  He does not have any orthopnea or PND.  He has a long smoking history though has never been diagnosed with COPD.  No history of autoimmune diseases.  His father did pass away from heart failure.  PMH, current medications, allergies, social history, and family history reviewed in epic.  PHYSICAL EXAM: Vitals:   05/30/23 0930  BP: 112/78   GENERAL: Chronically ill-appearing.   HEENT: The mucous membranes are pink and moist.   PULM: Normal work of breathing, no significant end expiratory wheezing, diminished breath sounds CARDIAC:  JVP: Not elevated         Irregular rate and rhythm, 1+ lower extremity edema ABDOMEN: Soft, non-tender, non-distended. NEUROLOGIC: Patient is oriented x3 with no focal or lateralizing neurologic deficits.  PSYCH: Patients affect is appropriate, there is no evidence of anxiety or depression.  SKIN: Warm and dry; no lesions or wounds.   Etiology of PH: Presumed group 2/3 WHO Functional class: [name] : Not yet done Volume status:  Mild volume up Pulmonary vasodilators: None Advanced therapies: Not a candidate given PAD  Echo:  11/24: Normal LVEF, LV hypertrophy, severely elevated pulmonary artery systolic pressure.  Severe biatrial enlargement, moderate TR, moderate MR    ASSESSMENT & PLAN:  Presumed pulmonary hypertension: Noted on echocardiogram for lower extremity swelling, patient with severe biatrial enlargement suggesting some element of left heart disease.  Has not been diagnosed with obstructive lung disease but longtime smoker.  Will proceed with right heart cath for evaluation as well as PFTs, if he does not have significant postcapillary pulmonary hypertension will obtain further workup. -Potential level study candidate -Start Farxiga 10 mg daily -Continue daily furosemide 20 mg -Right heart cath -PFTs -Further management based on numbers  Permanent atrial fibrillation: Failed previously attempts at cardioversion, unlikely to achieve sinus rhythm given severe biatrial enlargement.  Not on anticoagulation given not on anticoagulation given history of GI bleeds. - Continue aspirin, Plavix  -Not on BB  PAD: Severe, with ongoing claudication symptoms  bilaterally. - Follow up with vascular surgery  Tobacco abuse: - Not interested in quitting  Pancreatic duct dilation: - Noted after weight loss, IPMN, follows with  GI  [name], MD Advanced Heart Failure Mechanical Circulatory Support 05/30/23

## 2023-05-30 ENCOUNTER — Other Ambulatory Visit (HOSPITAL_COMMUNITY): Payer: Self-pay

## 2023-05-30 ENCOUNTER — Ambulatory Visit (HOSPITAL_COMMUNITY)
Admission: RE | Admit: 2023-05-30 | Discharge: 2023-05-30 | Disposition: A | Discharge status: Home / Self Care | Payer: PPO | Source: Ambulatory Visit | Attending: Cardiology | Admitting: Cardiology

## 2023-05-30 ENCOUNTER — Encounter (HOSPITAL_COMMUNITY): Payer: Self-pay | Admitting: Cardiology

## 2023-05-30 VITALS — BP 112/78 | Wt 136.0 lb

## 2023-05-30 DIAGNOSIS — I701 Atherosclerosis of renal artery: Secondary | ICD-10-CM | POA: Diagnosis not present

## 2023-05-30 DIAGNOSIS — Z8249 Family history of ischemic heart disease and other diseases of the circulatory system: Secondary | ICD-10-CM | POA: Diagnosis not present

## 2023-05-30 DIAGNOSIS — Z79899 Other long term (current) drug therapy: Secondary | ICD-10-CM | POA: Insufficient documentation

## 2023-05-30 DIAGNOSIS — Z7902 Long term (current) use of antithrombotics/antiplatelets: Secondary | ICD-10-CM | POA: Diagnosis not present

## 2023-05-30 DIAGNOSIS — I739 Peripheral vascular disease, unspecified: Secondary | ICD-10-CM | POA: Diagnosis not present

## 2023-05-30 DIAGNOSIS — F17200 Nicotine dependence, unspecified, uncomplicated: Secondary | ICD-10-CM

## 2023-05-30 DIAGNOSIS — K8689 Other specified diseases of pancreas: Secondary | ICD-10-CM | POA: Insufficient documentation

## 2023-05-30 DIAGNOSIS — I4891 Unspecified atrial fibrillation: Secondary | ICD-10-CM

## 2023-05-30 DIAGNOSIS — I4811 Longstanding persistent atrial fibrillation: Secondary | ICD-10-CM | POA: Diagnosis not present

## 2023-05-30 DIAGNOSIS — I429 Cardiomyopathy, unspecified: Secondary | ICD-10-CM | POA: Diagnosis not present

## 2023-05-30 DIAGNOSIS — I5032 Chronic diastolic (congestive) heart failure: Secondary | ICD-10-CM

## 2023-05-30 LAB — BASIC METABOLIC PANEL
Anion gap: 15 (ref 5–15)
BUN: 18 mg/dL (ref 8–23)
CO2: 24 mmol/L (ref 22–32)
Calcium: 8.7 mg/dL — ABNORMAL LOW (ref 8.9–10.3)
Chloride: 102 mmol/L (ref 98–111)
Creatinine, Ser: 1.04 mg/dL (ref 0.61–1.24)
GFR, Estimated: >60 mL/min (ref >60)
Glucose, Bld: 93 mg/dL (ref 70–99)
Potassium: 3.6 mmol/L (ref 3.5–5.1)
Sodium: 141 mmol/L (ref 135–145)

## 2023-05-30 LAB — BRAIN NATRIURETIC PEPTIDE: B Natriuretic Peptide: 899.8 pg/mL — ABNORMAL HIGH (ref 0.0–100.0)

## 2023-05-30 MED ORDER — FARXIGA 10 MG PO TABS: 10.0000 mg | ORAL_TABLET | Freq: Every day | ORAL | 3 refills | Status: DC

## 2023-05-30 NOTE — Patient Instructions (Signed)
START Farxiga 10 mg daily.  Your provider has ordered a lung function test. You will be called to have the test arranged.  You are scheduled for a Cardiac Catheterization on Monday, December 16 with Dr.  Elwyn Lade .  1. Please arrive at the [facility] (Main Entrance A) at [facility]: [address] [place], [place] [address] at 8:30 AM (This time is 2 hour(s) before your procedure to ensure your preparation).   Free valet parking service is available. You will check in at ADMITTING. The support person will be asked to wait in the waiting room.  It is OK to have someone drop you off and come back when you are ready to be discharged.    Special note: Every effort is made to have your procedure done on time. Please understand that emergencies sometimes delay scheduled procedures.  2. Diet: Do not eat solid foods after midnight.  The patient may have clear liquids until 5am upon the day of the procedure.  3. Medication instructions in preparation for your procedure:   Contrast Allergy: No  HOLD YOUR FARXIGA AND LASIX THE MORNING OF YOUR PROCEDURE.  On the morning of your procedure, take any morning medicines NOT listed above.  You may use sips of water.  5. Plan to go home the same day, you will only stay overnight if medically necessary. 6. Bring a current list of your medications and current insurance cards. 7. You MUST have a responsible person to drive you home. 8. Someone MUST be with you the first 24 hours after you arrive home or your discharge will be delayed. 9. Please wear clothes that are easy to get on and off and wear slip-on shoes.  Your physician recommends that you schedule a follow-up appointment in: 2 MONTHS (February 2025) ** PLEASE CALL THE OFFICE IN [place] TO ARRANGE YOUR FOLLOW UP APPOINTMENT. *  If you have any questions or concerns before your next appointment please send [place] a message through [place] or call our office at [phone].    TO LEAVE A MESSAGE  FOR THE NURSE SELECT OPTION 2, PLEASE LEAVE A MESSAGE INCLUDING: YOUR NAME DATE OF BIRTH CALL BACK NUMBER REASON FOR CALL**this is important as we prioritize the call backs  YOU WILL RECEIVE A CALL BACK THE SAME DAY AS LONG AS YOU CALL BEFORE 4:00 PM  At the Advanced Heart Failure Clinic, you and your health needs are our priority. As part of our continuing mission to provide you with exceptional heart care, we have created designated Provider Care Teams. These Care Teams include your primary Cardiologist (physician) and Advanced Practice Providers (APPs- Physician Assistants and Nurse Practitioners) who all work together to provide you with the care you need, when you need it.   You may see any of the following providers on your designated Care Team at your next follow up: Dr Arvilla Meres Dr Marca Ancona Dr. Dorthula Nettles Dr. [name] Amy Filbert Schilder, NP Robbie Lis, [place] [facility] [place], [place] Brynda Peon, NP [place] Lee, NP Karle Plumber, PharmD   Please be sure to bring in all your medications bottles to every appointment.    Thank you for choosing [facility]

## 2023-06-08 ENCOUNTER — Encounter: Payer: Self-pay | Admitting: Gastroenterology

## 2023-06-08 ENCOUNTER — Ambulatory Visit (INDEPENDENT_AMBULATORY_CARE_PROVIDER_SITE_OTHER): Payer: PPO | Admitting: Gastroenterology

## 2023-06-08 VITALS — BP 110/71 | HR 85 | Temp 97.1°F | Ht 68.0 in | Wt 143.2 lb

## 2023-06-08 DIAGNOSIS — K8689 Other specified diseases of pancreas: Secondary | ICD-10-CM

## 2023-06-08 DIAGNOSIS — Z8711 Personal history of peptic ulcer disease: Secondary | ICD-10-CM | POA: Diagnosis not present

## 2023-06-08 NOTE — Patient Instructions (Signed)
Continue Creon with your meals! 2 with meals and 1 with snacks.  We will see you in 3 months!  Have a wonderful holiday season!  It was a pleasure to see you today. I want to create trusting relationships with patients and provide genuine, compassionate, and quality care. I truly value your feedback, so please be on the lookout for a survey regarding your visit with me today. I appreciate your time in completing this!         Gelene Mink, PhD, ANP-BC [facility]

## 2023-06-08 NOTE — Progress Notes (Unsigned)
Gastroenterology Office Note     Primary Care Physician:  Elfredia Nevins, MD  Primary Gastroenterologist: Dr. Marletta Lor   Chief Complaint   Chief Complaint  Patient presents with   Follow-up    Patient here today for a three month follow up. Patient denies any current gi issues. Patient says his creon has helped him a lot. He is taking Creon 36,000 two capsules with meals and one with snacks. He will need refills on this if he is going to stay on it.      History of Present Illness   Todd [name] is a 72 y.o. male presenting today with a history of PUD due to NSAIDs,     CT angio abdomen pelvis 10/17/22 showed stenoses involving celiac trunk, SMA, IMA.  Patient seen by vascular who recommended continue monitoring given lack of symptoms related to chronic mesenteric ischemia.   CT also showed diffuse calcifications throughout the pancreas as well as dilatation of the main pancreatic duct possible cystic structure in the pancreatic head.  Patient notes significant history of alcohol abuse years ago.  Now drinks a 1-2 beers each Sunday during the [employer] race.   MRI/MRCP 12/01/22 showed large complex multiseptated cystic lesion of the head of the pancreas with marked dilatation of the pancreatic duct.   EGD/EUS 02/07/2023: Angiodysplasia in the stomach treated with APC.  Scar in prepyloric region of the stomach from previous ulcer.  EUS with pancreatic atrophy in the head body and pancreatic tail.  Dilated pancreatic duct with intraductal stone.  Multicystic lesion in the pancreatic head with FNA.   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  Patient's case further discussed in multidisciplinary conference, see below:   Patient's case was discussed at multidisciplinary conference this week. His imaging and his EUS findings and pancreatic fluid analysis was discussed. 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 Due to the premalignant status of this lesion and size, we discussed potential surgical referral even though patient had previously said he would want to do surveillance.    Patient saw Dr. Sophronia Simas Sept 20, 2024 with plans for monitoring closely. MRI in Nov 2024 without significant change.     Last colonoscopy June 2024: three 4-7 mm polyps, repeat in 5 years. Tubular adenomas  Past Medical History:  Diagnosis Date   Alcoholism [facility])    History of withdrawal and seizures   Anemia    Atrial fibrillation (HCC)    Taken off of Coumadin in 2009 in the setting of GI bleed   Carotid artery occlusion    Chronic back pain    Colitis    4/11   Dysrhythmia    Essential hypertension    Gout    Heart murmur    born with no issues   History of GI bleed    Hypothyroidism    Internal hemorrhoids    Colonoscopy 11/09   Osteoarthritis    Peripheral vascular disease (HCC)    Schatzki's ring    EGD 11/09    Past Surgical History:  Procedure Laterality Date   ABDOMINAL AORTOGRAM N/A 03/08/2019   Procedure: ABDOMINAL AORTOGRAM;  Surgeon: Sherren Kerns, MD;  [facility]: [facility] INVAS[facility];  Service: Cardiovascular;  Laterality: N/A;   ABDOMINAL AORTOGRAM W/LOWER EXTREMITY N/A 03/02/2021   Procedure: ABDOMINAL AORTOGRAM W/LOWER EXTREMITY;  Surgeon: Nada Libman, MD;  Location: [facility] INVASIVE  CV LAB;  Service: Cardiovascular;  Laterality: N/A;   APPLICATION OF WOUND VAC Right 07/22/2021   Procedure: APPLICATION OF WOUND VAC;  Surgeon: Nada Libman, MD;  Location: [facility] OR;  Service: Vascular;  Laterality: Right;   BIOPSY  10/20/2022   Procedure: BIOPSY;  Surgeon: Lanelle Bal, DO;  [facility]: [facility];  Service: Endoscopy;;   COLONOSCOPY WITH PROPOFOL N/A 10/20/2022   Procedure: COLONOSCOPY WITH PROPOFOL;  Surgeon: Lanelle Bal, DO;  [facility];  Service: Endoscopy;  Laterality: N/A;   COLONOSCOPY WITH PROPOFOL N/A  12/09/2022   Procedure: COLONOSCOPY WITH PROPOFOL;  Surgeon: Lanelle Bal, DO;  [facility]: [facility];  Service: Endoscopy;  Laterality: N/A;  10:15 AM, ASA 3   ENDARTERECTOMY Left 02/02/2022   Procedure: LEFT ENDARTERECTOMY CAROTID;  Surgeon: Nada Libman, MD;  Location: [facility] OR;  Service: Vascular;  Laterality: Left;   ESOPHAGOGASTRODUODENOSCOPY (EGD) WITH PROPOFOL N/A 10/20/2022   Procedure: ESOPHAGOGASTRODUODENOSCOPY (EGD) WITH PROPOFOL;  Surgeon: Lanelle Bal, DO;  [facility];  Service: Endoscopy;  Laterality: N/A;   ESOPHAGOGASTRODUODENOSCOPY (EGD) WITH PROPOFOL N/A 02/07/2023   Procedure: ESOPHAGOGASTRODUODENOSCOPY (EGD) WITH PROPOFOL;  Surgeon: Meridee Score Netty Starring., MD;  Location: [facility] ENDOSCOPY;  Service: Gastroenterology;  Laterality: N/A;   EUS N/A 02/07/2023   Procedure: UPPER ENDOSCOPIC ULTRASOUND (EUS) RADIAL;  Surgeon: Lemar Lofty., MD;  Location: [facility] ENDOSCOPY;  Service: Gastroenterology;  Laterality: N/A;   Excision of gouty lesion     Left index finger   FEMORAL-POPLITEAL BYPASS GRAFT Right 03/12/2021   Procedure: RIGHT FEMORAL TO POPLITEAL ARTERY BYPASS GRAFTING USING THE PROPATEN GRAFT;  Surgeon: Nada Libman, MD;  Location: [facility] OR;  Service: Vascular;  Laterality: Right;   FINE NEEDLE ASPIRATION N/A 02/07/2023   Procedure: FINE NEEDLE ASPIRATION (FNA) LINEAR;  Surgeon: Lemar Lofty., MD;  Location: [facility];  Service: Gastroenterology;  Laterality: N/A;   GROIN DEBRIDEMENT Right 07/22/2021   Procedure: RIGHT GROIN DEBRIDEMENT;  Surgeon: Nada Libman, MD;  Location: [facility] OR;  Service: Vascular;  Laterality: Right;   HOT HEMOSTASIS N/A 02/07/2023   Procedure: HOT HEMOSTASIS (ARGON PLASMA COAGULATION/BICAP);  Surgeon: Lemar Lofty., MD;  Location: [facility];  Service: Gastroenterology;  Laterality: N/A;   INSERTION OF ILIAC STENT Right 03/12/2021   Procedure: INSERTION OF  EXTERNAL ILIAC STENT;  Surgeon: Nada Libman, MD;  [facility];  Service: Vascular;  Laterality: Right;   LOWER EXTREMITY ANGIOGRAM Right 03/12/2021   Procedure: LOWER EXTREMITY ANGIOGRAM;  Surgeon: Nada Libman, MD;  [facility]: [facility];  Service: Vascular;  Laterality: Right;   LOWER EXTREMITY ANGIOGRAPHY Bilateral 03/08/2019   Procedure: Lower Extremity Angiography;  Surgeon: Sherren Kerns, MD;  Location: [facility] INVASIVE CV LAB;  Service: Cardiovascular;  Laterality: Bilateral;   LOWER EXTREMITY ANGIOGRAPHY N/A 03/15/2021   Procedure: LOWER EXTREMITY ANGIOGRAPHY;  Surgeon: Maeola Harman, MD;  Location: [facility] INVASIVE CV LAB;  Service: Cardiovascular;  Laterality: N/A;   MASS EXCISION Right 07/07/2020   Procedure: EXCISION OF RIGHT FACIAL MASS;  Surgeon: Newman Pies, MD;  Location: [facility];  Service: ENT;  Laterality: Right;   PATCH ANGIOPLASTY Left 02/02/2022   Procedure: PATCH ANGIOPLASTY OF LEFT CAROTID USING XENOSURE BOVINE PERICARDIUM PATCH;  Surgeon: Nada Libman, MD;  Location: [facility] OR;  Service: Vascular;  Laterality: Left;   PERIPHERAL VASCULAR BALLOON ANGIOPLASTY Right 03/02/2021   Procedure: PERIPHERAL VASCULAR BALLOON ANGIOPLASTY;  Surgeon: Nada Libman, MD;  [facility];  Service: Cardiovascular;  Laterality: Right;  external iliac   PERIPHERAL VASCULAR INTERVENTION Bilateral 03/08/2019   Procedure: PERIPHERAL VASCULAR INTERVENTION;  Surgeon: Sherren Kerns, MD;  Location: [facility];  Service: Cardiovascular;  Laterality: Bilateral;  ext iliac artery stent   PERIPHERAL VASCULAR INTERVENTION Left 03/02/2021   Procedure: PERIPHERAL VASCULAR INTERVENTION;  Surgeon: Nada Libman, MD;  [facility]: [facility];  Service: Cardiovascular;  Laterality: Left;  external iliac   PERIPHERAL VASCULAR INTERVENTION Right 03/15/2021   Procedure: PERIPHERAL VASCULAR INTERVENTION;  Surgeon: Maeola Harman, MD;  Location[facility] INVASIVE CV LAB;  Service: Cardiovascular;   Laterality: Right;  external iliac   POLYPECTOMY  12/09/2022   Procedure: POLYPECTOMY INTESTINAL;  Surgeon: Lanelle Bal, DO;  [facility];  Service: Endoscopy;;   Right knee arthroscopy     SPINE SURGERY      Current Outpatient Medications  Medication Sig Dispense Refill   aspirin EC 81 MG EC tablet Take 1 tablet (81 mg total) by mouth daily at 6 (six) AM. Swallow whole. 30 tablet 11   cholecalciferol (VITAMIN D3) 25 MCG (1000 UNIT) tablet Take 1,000 Units by mouth daily.     clopidogrel (PLAVIX) 75 MG tablet TAKE ONE TABLET BY MOUTH ONCE DAILY. 90 tablet 1   diclofenac Sodium (VOLTAREN) 1 % GEL Apply 4 g topically 4 (four) times daily as needed (pain).     Ensure (ENSURE) Take 1 Can by mouth daily.     FARXIGA 10 MG TABS tablet Take 1 tablet (10 mg total) by mouth daily before breakfast. 90 tablet 3   furosemide (LASIX) 20 MG tablet Take 20-60 mg by mouth daily as needed for edema.     levothyroxine (SYNTHROID) 25 MCG tablet Take 25 mcg by mouth daily before breakfast.     lipase/protease/amylase (CREON) 36000 UNITS CPEP capsule Take 2 capsules (72,000 Units total) by mouth 3 (three) times daily before meals. And 1 capsule with snacks 240 capsule 11   pantoprazole (PROTONIX) 40 MG tablet Take 1 tablet (40 mg total) by mouth 2 (two) times daily. 60 tablet 11   rosuvastatin (CRESTOR) 10 MG tablet TAKE (1) TABLET BY MOUTH ONCE DAILY. 30 tablet 0   No current facility-administered medications for this visit.    Allergies as of 06/08/2023   (No Known Allergies)    Family History  Problem Relation Age of Onset   Coronary artery disease Father    Coronary artery disease Sister        MI at age 73    Social History   Socioeconomic History   Marital status: Married    Spouse name: Not on file   Number of children: 3   Years of education: Not on file   Highest education level: Not on file  Occupational History   Occupation: Retired    Associate Professor: [employer]   Tobacco Use   Smoking status: Every Day    Current packs/day: 0.50    Types: Cigarettes    Passive exposure: Current   Smokeless tobacco: Never   Tobacco comments:    0.25-0.5 packs per day  Vaping Use   Vaping status: Never Used  Substance and Sexual Activity   Alcohol use: Yes    Alcohol/week: 5.0 standard drinks of alcohol    Types: 5 Cans of beer per week    Comment: less than a 6 pack of beer per week   Drug use: No  Sexual activity: Not Currently  Other Topics Concern   Not on file  Social History Narrative   Not on file   Social Drivers of Health   Financial Resource Strain: Not on file  Food Insecurity: No Food Insecurity (10/17/2022)   Hunger Vital Sign    Worried About Running Out of Food in the Last Year: Never true    Ran Out of Food in the Last Year: Never true  Transportation Needs: No Transportation Needs (10/17/2022)   PRAPARE - Administrator, Civil Service (Medical): No    Lack of Transportation (Non-Medical): No  Physical Activity: Not on file  Stress: Not on file  Social Connections: Not on file  Intimate Partner Violence: Not At Risk (10/17/2022)   Humiliation, Afraid, Rape, and Kick questionnaire    Fear of Current or Ex-Partner: No    Emotionally Abused: No    Physically Abused: No    Sexually Abused: No     Review of Systems   Gen: Denies any fever, chills, fatigue, weight loss, lack of appetite.  CV: Denies chest pain, heart palpitations, peripheral edema, syncope.  Resp: Denies shortness of breath at rest or with exertion. Denies wheezing or cough.  GI: Denies dysphagia or odynophagia. Denies jaundice, hematemesis, fecal incontinence. GU : Denies urinary burning, urinary frequency, urinary hesitancy MS: Denies joint pain, muscle weakness, cramps, or limitation of movement.  Derm: Denies rash, itching, dry skin Psych: Denies depression, anxiety, memory loss, and confusion Heme: Denies bruising, bleeding, and enlarged lymph  nodes.   Physical Exam   BP 110/71 (BP Location: Left Arm, Patient Position: Sitting, Cuff Size: Normal)   Pulse 85   Temp (!) 97.1 F (36.2 C) (Temporal)   Ht 5\' 8"  (1.727 m)   Wt 143 lb 3.2 oz (65 kg)   BMI 21.77 kg/m  General:   Alert and oriented. Pleasant and cooperative. Well-nourished and well-developed.  Head:  Normocephalic and atraumatic. Eyes:  Without icterus Abdomen:  +BS, soft, non-tender and non-distended. No HSM noted. No guarding or rebound. No masses appreciated.  Rectal:  Deferred  Msk:  Symmetrical without gross deformities. Normal posture. Extremities:  Without edema. Neurologic:  Alert and  oriented x4;  grossly normal neurologically. Skin:  Intact without significant lesions or rashes. Psych:  Alert and cooperative. Normal mood and affect.   Assessment   Todd [name] is a 72 y.o. male presenting today with a history of    PLAN    Return in 3 months Recommend Vit D when patient returns:    Gelene Mink, PhD, ANP-BC [facility] Gastroenterology

## 2023-06-12 ENCOUNTER — Encounter (HOSPITAL_COMMUNITY)
Admission: RE | Disposition: A | Discharge status: Home / Self Care | Payer: Self-pay | Source: Ambulatory Visit | Attending: Cardiology

## 2023-06-12 ENCOUNTER — Other Ambulatory Visit: Payer: Self-pay

## 2023-06-12 ENCOUNTER — Ambulatory Visit (HOSPITAL_COMMUNITY)
Admission: RE | Admit: 2023-06-12 | Discharge: 2023-06-12 | Disposition: A | Discharge status: Home / Self Care | Payer: PPO | Source: Ambulatory Visit | Attending: Cardiology | Admitting: Cardiology

## 2023-06-12 DIAGNOSIS — I739 Peripheral vascular disease, unspecified: Secondary | ICD-10-CM | POA: Diagnosis not present

## 2023-06-12 DIAGNOSIS — K8689 Other specified diseases of pancreas: Secondary | ICD-10-CM | POA: Insufficient documentation

## 2023-06-12 DIAGNOSIS — I701 Atherosclerosis of renal artery: Secondary | ICD-10-CM | POA: Insufficient documentation

## 2023-06-12 DIAGNOSIS — I4821 Permanent atrial fibrillation: Secondary | ICD-10-CM | POA: Diagnosis not present

## 2023-06-12 DIAGNOSIS — I272 Pulmonary hypertension, unspecified: Secondary | ICD-10-CM | POA: Insufficient documentation

## 2023-06-12 DIAGNOSIS — Z7982 Long term (current) use of aspirin: Secondary | ICD-10-CM | POA: Insufficient documentation

## 2023-06-12 DIAGNOSIS — Z7902 Long term (current) use of antithrombotics/antiplatelets: Secondary | ICD-10-CM | POA: Diagnosis not present

## 2023-06-12 DIAGNOSIS — R0602 Shortness of breath: Secondary | ICD-10-CM | POA: Insufficient documentation

## 2023-06-12 DIAGNOSIS — Z87891 Personal history of nicotine dependence: Secondary | ICD-10-CM | POA: Insufficient documentation

## 2023-06-12 DIAGNOSIS — Z8249 Family history of ischemic heart disease and other diseases of the circulatory system: Secondary | ICD-10-CM | POA: Insufficient documentation

## 2023-06-12 DIAGNOSIS — I509 Heart failure, unspecified: Secondary | ICD-10-CM | POA: Insufficient documentation

## 2023-06-12 DIAGNOSIS — Z79899 Other long term (current) drug therapy: Secondary | ICD-10-CM | POA: Diagnosis not present

## 2023-06-12 DIAGNOSIS — I429 Cardiomyopathy, unspecified: Secondary | ICD-10-CM | POA: Diagnosis not present

## 2023-06-12 DIAGNOSIS — I5032 Chronic diastolic (congestive) heart failure: Secondary | ICD-10-CM

## 2023-06-12 HISTORY — PX: RIGHT HEART CATH: CATH118263

## 2023-06-12 LAB — POCT I-STAT EG7
Acid-Base Excess: 4 mmol/L — ABNORMAL HIGH (ref 0.0–2.0)
Acid-Base Excess: 5 mmol/L — ABNORMAL HIGH (ref 0.0–2.0)
Bicarbonate: 28.6 mmol/L — ABNORMAL HIGH (ref 20.0–28.0)
Bicarbonate: 30.2 mmol/L — ABNORMAL HIGH (ref 20.0–28.0)
Calcium, Ion: 1.05 mmol/L — ABNORMAL LOW (ref 1.15–1.40)
Calcium, Ion: 1.08 mmol/L — ABNORMAL LOW (ref 1.15–1.40)
HCT: 32 % — ABNORMAL LOW (ref 39.0–52.0)
HCT: 34 % — ABNORMAL LOW (ref 39.0–52.0)
Hemoglobin: 10.9 g/dL — ABNORMAL LOW (ref 13.0–17.0)
Hemoglobin: 11.6 g/dL — ABNORMAL LOW (ref 13.0–17.0)
O2 Saturation: 54 %
O2 Saturation: 58 %
Potassium: 3.2 mmol/L — ABNORMAL LOW (ref 3.5–5.1)
Potassium: 3.3 mmol/L — ABNORMAL LOW (ref 3.5–5.1)
Sodium: 144 mmol/L (ref 135–145)
Sodium: 142 mmol/L (ref 135–145)
TCO2: 30 mmol/L (ref 22–32)
TCO2: 32 mmol/L (ref 22–32)
pCO2, Ven: 43.8 mm[Hg] — ABNORMAL LOW (ref 44–60)
pCO2, Ven: 44.3 mm[Hg] (ref 44–60)
pH, Ven: 7.424 (ref 7.25–7.43)
pH, Ven: 7.441 — ABNORMAL HIGH (ref 7.25–7.43)
pO2, Ven: 28 mm[Hg] — CL (ref 32–45)
pO2, Ven: 29 mm[Hg] — CL (ref 32–45)

## 2023-06-12 LAB — CBC
HCT: 37.1 % — ABNORMAL LOW (ref 39.0–52.0)
Hemoglobin: 11 g/dL — ABNORMAL LOW (ref 13.0–17.0)
MCH: 28.1 pg (ref 26.0–34.0)
MCHC: 29.6 g/dL — ABNORMAL LOW (ref 30.0–36.0)
MCV: 94.6 fL (ref 80.0–100.0)
Platelets: 190 10*3/uL (ref 150–400)
RBC: 3.92 MIL/uL — ABNORMAL LOW (ref 4.22–5.81)
RDW: 14.9 % (ref 11.5–15.5)
WBC: 6.5 10*3/uL (ref 4.0–10.5)
nRBC: 0 % (ref 0.0–0.2)

## 2023-06-12 SURGERY — RIGHT HEART CATH
Anesthesia: LOCAL

## 2023-06-12 MED ORDER — HEPARIN SODIUM (PORCINE) 1000 UNIT/ML IJ SOLN
INTRAMUSCULAR | Status: AC
  Filled 2023-06-12: qty 10

## 2023-06-12 MED ORDER — FUROSEMIDE 20 MG PO TABS: 20.0000 mg | ORAL_TABLET | Freq: Every day | ORAL | 11 refills | Status: DC

## 2023-06-12 MED ORDER — HEPARIN (PORCINE) IN NACL 1000-0.9 UT/500ML-% IV SOLN
INTRAVENOUS | Status: DC | PRN
  Administered 2023-06-12: 500 mL

## 2023-06-12 MED ORDER — LIDOCAINE HCL (PF) 1 % IJ SOLN
INTRAMUSCULAR | Status: DC | PRN
  Administered 2023-06-12: 2 mL

## 2023-06-12 MED ORDER — SODIUM CHLORIDE 0.9 % IV SOLN: INTRAVENOUS | Status: DC

## 2023-06-12 MED ORDER — LIDOCAINE HCL (PF) 1 % IJ SOLN
INTRAMUSCULAR | Status: AC
  Filled 2023-06-12: qty 30

## 2023-06-12 SURGICAL SUPPLY — 5 items
CATH BALLN WEDGE 5F 110CM (CATHETERS) IMPLANT
PACK CARDIAC CATHETERIZATION (CUSTOM PROCEDURE TRAY) ×1 IMPLANT
SHEATH GLIDE SLENDER 4/5FR (SHEATH) IMPLANT
TRANSDUCER W/STOPCOCK (MISCELLANEOUS) IMPLANT
TUBING ART PRESS 72 MALE/FEM (TUBING) IMPLANT

## 2023-06-12 NOTE — Discharge Instructions (Signed)

## 2023-06-12 NOTE — Interval H&P Note (Signed)
History and Physical Interval Note:  06/12/2023 10:47 AM  Todd [name]  has presented today for surgery, with the diagnosis of heart failure - hp.  The various methods of treatment have been discussed with the patient and family. After consideration of risks, benefits and other options for treatment, the patient has consented to  Procedure(s): RIGHT HEART CATH (N/A) as a surgical intervention.  The patient's history has been reviewed, patient examined, no change in status, stable for surgery.  I have reviewed the patient's chart and labs.  Questions were answered to the patient's satisfaction.     Romie Minus

## 2023-06-13 ENCOUNTER — Encounter (HOSPITAL_COMMUNITY): Payer: Self-pay | Admitting: Cardiology

## 2023-06-22 DIAGNOSIS — D490 Neoplasm of unspecified behavior of digestive system: Secondary | ICD-10-CM | POA: Diagnosis not present

## 2023-06-22 DIAGNOSIS — K861 Other chronic pancreatitis: Secondary | ICD-10-CM | POA: Diagnosis not present

## 2023-06-22 DIAGNOSIS — K8681 Exocrine pancreatic insufficiency: Secondary | ICD-10-CM | POA: Diagnosis not present

## 2023-06-23 ENCOUNTER — Encounter (INDEPENDENT_AMBULATORY_CARE_PROVIDER_SITE_OTHER): Payer: Self-pay

## 2023-06-23 ENCOUNTER — Ambulatory Visit (INDEPENDENT_AMBULATORY_CARE_PROVIDER_SITE_OTHER): Payer: PPO | Admitting: Otolaryngology

## 2023-06-23 VITALS — BP 95/65 | HR 102 | Ht 68.0 in | Wt 144.0 lb

## 2023-06-23 DIAGNOSIS — H903 Sensorineural hearing loss, bilateral: Secondary | ICD-10-CM | POA: Diagnosis not present

## 2023-06-23 NOTE — Progress Notes (Signed)
Patient ID: Todd [name], male   DOB: [birth year], 72 y.o.   MRN: [id number]  Follow-up: Hearing loss  HPI: The patient is a 72 year old male who returns today for his follow-up evaluation.  He was previously seen for bilateral high-frequency sensorineural hearing loss, significantly worse on the right side.  His MRI scan was negative for retrocochlear lesion.  He was fitted with bilateral hearing aids.  The patient returns today reporting improvement in his hearing with her hearing aids.  He denies any otalgia, otorrhea, vertigo, or recent change in his hearing.  Exam: General: Communicates without difficulty, well nourished, no acute distress. Head: Normocephalic, no evidence injury, no tenderness, facial buttresses intact without stepoff. Face/sinus: No tenderness to palpation and percussion. Facial movement is normal and symmetric. Eyes: PERRL, EOMI. No scleral icterus, conjunctivae clear. Neuro: CN II exam reveals vision grossly intact.  No nystagmus at any point of gaze. Ears: Auricles well formed without lesions.  Ear canals are intact without mass or lesion.  No erythema or edema is appreciated.  The TMs are intact without fluid. Nose: External evaluation reveals normal support and skin without lesions.  Dorsum is intact.  Anterior rhinoscopy reveals congested mucosa over anterior aspect of inferior turbinates and intact septum.  No purulence noted. Oral:  Oral cavity and oropharynx are intact, symmetric, without erythema or edema.  Mucosa is moist without lesions. Neck: Full range of motion without pain.  There is no significant lymphadenopathy.  No masses palpable.  Thyroid bed within normal limits to palpation.  Parotid glands and submandibular glands equal bilaterally without mass.  Trachea is midline. Neuro:  CN 2-12 grossly intact.   Assessment: 1.  Subjectively stable bilateral high-frequency sensorineural hearing loss, worse on the right side. 2.  His previous MRI scan was negative  for retrocochlear lesion. 3.  His ear canals, tympanic membranes, and middle ear spaces are all normal.  Plan: 1.  The physical exam findings are reviewed with the patient. 2.  Continue the use of his hearing aids. 3.  The patient will return for reevaluation in 1 year, sooner if needed.

## 2023-07-20 ENCOUNTER — Ambulatory Visit (HOSPITAL_COMMUNITY)
Admission: RE | Admit: 2023-07-20 | Discharge: 2023-07-20 | Disposition: A | Discharge status: Home / Self Care | Payer: PPO | Source: Ambulatory Visit | Attending: Surgery | Admitting: Surgery

## 2023-07-20 ENCOUNTER — Ambulatory Visit (INDEPENDENT_AMBULATORY_CARE_PROVIDER_SITE_OTHER)
Admission: RE | Admit: 2023-07-20 | Discharge: 2023-07-20 | Disposition: A | Discharge status: Home / Self Care | Payer: PPO | Source: Ambulatory Visit | Attending: Surgery | Admitting: Surgery

## 2023-07-20 ENCOUNTER — Ambulatory Visit: Payer: PPO | Admitting: Physician Assistant

## 2023-07-20 ENCOUNTER — Telehealth: Payer: Self-pay

## 2023-07-20 VITALS — BP 99/70 | HR 94 | Temp 97.6°F | Resp 20 | Ht 68.0 in | Wt 149.0 lb

## 2023-07-20 DIAGNOSIS — I739 Peripheral vascular disease, unspecified: Secondary | ICD-10-CM

## 2023-07-20 DIAGNOSIS — R600 Localized edema: Secondary | ICD-10-CM | POA: Diagnosis not present

## 2023-07-20 LAB — VAS US ABI WITH/WO TBI: Right ABI: 1.38

## 2023-07-20 NOTE — Telephone Encounter (Signed)
Triage Call:   Patient's spouse called concerned about swelling and blisters on his LL leg and does not think he should wait until April with he is follow-up is scheduled for.  Spoke to patient and he explains his swelling has been getting worse and now starts at his hips.   Patient available to come in this week or next. Appointment booked

## 2023-07-21 NOTE — Progress Notes (Unsigned)
Office Note     CC:  follow up Requesting Provider:  Elfredia Nevins, MD  HPI: Todd [name] is a 74 y.o. (1950-12-03) male who presents to clinic as an urgent add-on due to shortness of breath, bilateral lower extremity edema.  Surgical history significant for right femoral to popliteal bypass graft with PTFE in September 2022 by Dr. Myra Gianotti.  He has also had a left carotid endarterectomy for asymptomatic stenosis in August 2023.  He has a known right ICA occlusion.  Patient is complaining of shortness of breath especially when laying down.  He has blistering on bilateral lower extremities with "fluid" involving both legs all the way up to his abdomen.  He is a patient of Dr. Eliot Ford due to a large pancreatic cyst as well as chronic pancreatitis, pancreatic duct dilation, and a stone in the head of the pancreas.  Given his complex past medical history he was deemed not a candidate for [facility].  He is also being worked up by cardiology.  Dr. Elwyn Lade recently performed a right heart catheterization as part of a pulmonary hypertension workup.  He has not yet followed up in office postoperatively.  Patient states he has been taking Lasix on a daily basis however this does not improve edema affecting both legs and abdomen.  This also does not improve his shortness of breath and cough.  He denies claudication or rest pain in his feet.  He is on aspirin, Plavix, statin daily.  He is a daily tobacco user.   Past Medical History:  Diagnosis Date   Alcoholism [facility])    History of withdrawal and seizures   Anemia    Atrial fibrillation (HCC)    Taken off of Coumadin in 2009 in the setting of GI bleed   Carotid artery occlusion    Chronic back pain    Colitis    4/11   Dysrhythmia    Essential hypertension    Gout    Heart murmur    born with no issues   History of GI bleed    Hypothyroidism    Internal hemorrhoids    Colonoscopy 11/09   Osteoarthritis    Peripheral vascular disease (HCC)     Schatzki's ring    EGD 11/09    Past Surgical History:  Procedure Laterality Date   ABDOMINAL AORTOGRAM N/A 03/08/2019   Procedure: ABDOMINAL AORTOGRAM;  Surgeon: Sherren Kerns, MD;  [facility]: [facility];  Service: Cardiovascular;  Laterality: N/A;   ABDOMINAL AORTOGRAM W/LOWER EXTREMITY N/A 03/02/2021   Procedure: ABDOMINAL AORTOGRAM W/LOWER EXTREMITY;  Surgeon: Nada Libman, MD;  Location: [facility];  Service: Cardiovascular;  Laterality: N/A;   APPLICATION OF WOUND VAC Right 07/22/2021   Procedure: APPLICATION OF WOUND VAC;  Surgeon: Nada Libman, MD;  Location: [facility];  Service: Vascular;  Laterality: Right;   BIOPSY  10/20/2022   Procedure: BIOPSY;  Surgeon: Lanelle Bal, DO;  [facility]: [facility];  Service: Endoscopy;;   COLONOSCOPY WITH PROPOFOL N/A 10/20/2022   Procedure: COLONOSCOPY WITH PROPOFOL;  Surgeon: Lanelle Bal, DO;  [facility];  Service: Endoscopy;  Laterality: N/A;   COLONOSCOPY WITH PROPOFOL N/A 12/09/2022   Procedure: COLONOSCOPY WITH PROPOFOL;  Surgeon: Lanelle Bal, DO;  [facility]: [facility];  Service: Endoscopy;  Laterality: N/A;  10:15 AM, ASA 3   ENDARTERECTOMY Left 02/02/2022   Procedure: LEFT ENDARTERECTOMY CAROTID;  Surgeon: Nada Libman, MD;  Location: [facility];  Service: Vascular;  Laterality: Left;   ESOPHAGOGASTRODUODENOSCOPY (EGD) WITH PROPOFOL N/A 10/20/2022   Procedure: ESOPHAGOGASTRODUODENOSCOPY (EGD) WITH PROPOFOL;  Surgeon: Lanelle Bal, DO;  [facility];  Service: Endoscopy;  Laterality: N/A;   ESOPHAGOGASTRODUODENOSCOPY (EGD) WITH PROPOFOL N/A 02/07/2023   Procedure: ESOPHAGOGASTRODUODENOSCOPY (EGD) WITH PROPOFOL;  Surgeon: Meridee Score Netty Starring., MD;  Location: [facility];  Service: Gastroenterology;  Laterality: N/A;   EUS N/A 02/07/2023   Procedure: UPPER ENDOSCOPIC ULTRASOUND (EUS) RADIAL;  Surgeon: Lemar Lofty., MD;  Location: [facility];  Service:  Gastroenterology;  Laterality: N/A;   Excision of gouty lesion     Left index finger   FEMORAL-POPLITEAL BYPASS GRAFT Right 03/12/2021   Procedure: RIGHT FEMORAL TO POPLITEAL ARTERY BYPASS GRAFTING USING THE PROPATEN GRAFT;  Surgeon: Nada Libman, MD;  [facility]: [facility];  Service: Vascular;  Laterality: Right;   FINE NEEDLE ASPIRATION N/A 02/07/2023   Procedure: FINE NEEDLE ASPIRATION (FNA) LINEAR;  Surgeon: Lemar Lofty., MD;  Location: [facility];  Service: Gastroenterology;  Laterality: N/A;   GROIN DEBRIDEMENT Right 07/22/2021   Procedure: RIGHT GROIN DEBRIDEMENT;  Surgeon: Nada Libman, MD;  Location: [facility] OR;  Service: Vascular;  Laterality: Right;   HOT HEMOSTASIS N/A 02/07/2023   Procedure: HOT HEMOSTASIS (ARGON PLASMA COAGULATION/BICAP);  Surgeon: Lemar Lofty., MD;  Location: [facility];  Service: Gastroenterology;  Laterality: N/A;   INSERTION OF ILIAC STENT Right 03/12/2021   Procedure: INSERTION OF  EXTERNAL ILIAC STENT;  Surgeon: Nada Libman, MD;  [facility];  Service: Vascular;  Laterality: Right;   LOWER EXTREMITY ANGIOGRAM Right 03/12/2021   Procedure: LOWER EXTREMITY ANGIOGRAM;  Surgeon: Nada Libman, MD;  [facility];  Service: Vascular;  Laterality: Right;   LOWER EXTREMITY ANGIOGRAPHY Bilateral 03/08/2019   Procedure: Lower Extremity Angiography;  Surgeon: Sherren Kerns, MD;  Location: [facility] INVASIVE CV LAB;  Service: Cardiovascular;  Laterality: Bilateral;   LOWER EXTREMITY ANGIOGRAPHY N/A 03/15/2021   Procedure: LOWER EXTREMITY ANGIOGRAPHY;  Surgeon: Maeola Harman, MD;  Location: [facility] INVASIVE CV LAB;  Service: Cardiovascular;  Laterality: N/A;   MASS EXCISION Right 07/07/2020   Procedure: EXCISION OF RIGHT FACIAL MASS;  Surgeon: Newman Pies, MD;  Location: [facility];  Service: ENT;  Laterality: Right;   PATCH ANGIOPLASTY Left 02/02/2022   Procedure: PATCH ANGIOPLASTY OF LEFT CAROTID USING XENOSURE BOVINE  PERICARDIUM PATCH;  Surgeon: Nada Libman, MD;  [facility];  Service: Vascular;  Laterality: Left;   PERIPHERAL VASCULAR BALLOON ANGIOPLASTY Right 03/02/2021   Procedure: PERIPHERAL VASCULAR BALLOON ANGIOPLASTY;  Surgeon: Nada Libman, MD;  [facility];  Service: Cardiovascular;  Laterality: Right;  external iliac   PERIPHERAL VASCULAR INTERVENTION Bilateral 03/08/2019   Procedure: PERIPHERAL VASCULAR INTERVENTION;  Surgeon: Sherren Kerns, MD;  [facility];  Service: Cardiovascular;  Laterality: Bilateral;  ext iliac artery stent   PERIPHERAL VASCULAR INTERVENTION Left 03/02/2021   Procedure: PERIPHERAL VASCULAR INTERVENTION;  Surgeon: Nada Libman, MD;  [facility];  Service: Cardiovascular;  Laterality: Left;  external iliac   PERIPHERAL VASCULAR INTERVENTION Right 03/15/2021   Procedure: PERIPHERAL VASCULAR INTERVENTION;  Surgeon: Maeola Harman, MD;  Location: [facility] INVASIVE CV LAB;  Service: Cardiovascular;  Laterality: Right;  external iliac   POLYPECTOMY  12/09/2022   Procedure: POLYPECTOMY INTESTINAL;  Surgeon: Lanelle Bal, DO;  [facility];  Service: Endoscopy;;   RIGHT HEART CATH N/A 06/12/2023  Procedure: RIGHT HEART CATH;  Surgeon: Romie Minus, MD;  Location: [facility] INVASIVE CV LAB;  Service: Cardiovascular;  Laterality: N/A;   Right knee arthroscopy     SPINE SURGERY      Social History   Socioeconomic History   Marital status: Married    Spouse name: Not on file   Number of children: 3   Years of education: Not on file   Highest education level: Not on file  Occupational History   Occupation: Retired    Associate Professor: [employer]  Tobacco Use   Smoking status: Every Day    Current packs/day: 0.50    Types: Cigarettes    Passive exposure: Current   Smokeless tobacco: Never   Tobacco comments:    0.25-0.5 packs per day  Vaping Use   Vaping status: Never Used  Substance  and Sexual Activity   Alcohol use: Yes    Alcohol/week: 5.0 standard drinks of alcohol    Types: 5 Cans of beer per week    Comment: less than a 6 pack of beer per week   Drug use: No   Sexual activity: Not Currently  Other Topics Concern   Not on file  Social History Narrative   Not on file   Social Drivers of Health   Financial Resource Strain: Not on file  Food Insecurity: No Food Insecurity (10/17/2022)   Hunger Vital Sign    Worried About Running Out of Food in the Last Year: Never true    Ran Out of Food in the Last Year: Never true  Transportation Needs: No Transportation Needs (10/17/2022)   PRAPARE - Administrator, Civil Service (Medical): No    Lack of Transportation (Non-Medical): No  Physical Activity: Not on file  Stress: Not on file  Social Connections: Not on file  Intimate Partner Violence: Not At Risk (10/17/2022)   Humiliation, Afraid, Rape, and Kick questionnaire    Fear of Current or Ex-Partner: No    Emotionally Abused: No    Physically Abused: No    Sexually Abused: No    Family History  Problem Relation Age of Onset   Coronary artery disease Father    Coronary artery disease Sister        MI at age 28    Current Outpatient Medications  Medication Sig Dispense Refill   aspirin EC 81 MG EC tablet Take 1 tablet (81 mg total) by mouth daily at 6 (six) AM. Swallow whole. 30 tablet 11   cholecalciferol (VITAMIN D3) 25 MCG (1000 UNIT) tablet Take 1,000 Units by mouth daily.     clopidogrel (PLAVIX) 75 MG tablet TAKE ONE TABLET BY MOUTH ONCE DAILY. 90 tablet 1   diclofenac Sodium (VOLTAREN) 1 % GEL Apply 4 g topically 4 (four) times daily as needed (pain).     Ensure (ENSURE) Take 1 Can by mouth daily.     FARXIGA 10 MG TABS tablet Take 1 tablet (10 mg total) by mouth daily before breakfast. 90 tablet 3   furosemide (LASIX) 20 MG tablet Take 1 tablet (20 mg total) by mouth daily. 30 tablet 11   levothyroxine (SYNTHROID) 25 MCG tablet Take 25  mcg by mouth daily before breakfast.     lipase/protease/amylase (CREON) 36000 UNITS CPEP capsule Take 2 capsules (72,000 Units total) by mouth 3 (three) times daily before meals. And 1 capsule with snacks 240 capsule 11   pantoprazole (PROTONIX) 40 MG tablet Take 1 tablet (40 mg total) by mouth  2 (two) times daily. 60 tablet 11   rosuvastatin (CRESTOR) 10 MG tablet TAKE (1) TABLET BY MOUTH ONCE DAILY. 30 tablet 0   No current facility-administered medications for this visit.    No Known Allergies   REVIEW OF SYSTEMS:   [X]  denotes positive finding, [ ]  denotes negative finding Cardiac  Comments:  Chest pain or chest pressure:    Shortness of breath upon exertion:    Short of breath when lying flat:    Irregular heart rhythm:        Vascular    Pain in calf, thigh, or hip brought on by ambulation:    Pain in feet at night that wakes you up from your sleep:     Blood clot in your veins:    Leg swelling:         Pulmonary    Oxygen at home:    Productive cough:     Wheezing:         Neurologic    Sudden weakness in arms or legs:     Sudden numbness in arms or legs:     Sudden onset of difficulty speaking or slurred speech:    Temporary loss of vision in one eye:     Problems with dizziness:         Gastrointestinal    Blood in stool:     Vomited blood:         Genitourinary    Burning when urinating:     Blood in urine:        Psychiatric    Major depression:         Hematologic    Bleeding problems:    Problems with blood clotting too easily:        Skin    Rashes or ulcers:        Constitutional    Fever or chills:      PHYSICAL EXAMINATION:  Vitals:   07/20/23 1451  BP: 99/70  Pulse: 94  Resp: 20  Temp: 97.6 F (36.4 C)  SpO2: (!) 87%  Weight: 149 lb (67.6 kg)  Height: 5\' 8"  (1.727 m)    General:  labored tripod breathing, mild jaundice Gait: Not observed HENT: WNL, normocephalic Pulmonary: Labored breathing especially when speaking;  tripod position; shortness of breath with minimal exertion; expiratory wheeze when laying down Cardiac: regular HR Abdomen: Pitting edema lower quadrants Vascular Exam/Pulses: Brisk DP and PT signals by Doppler bilateral lower extremities Extremities: Fluid-filled blisters both ankles and shins Musculoskeletal: no muscle wasting or atrophy  Neurologic: A&O X 3 Psychiatric:  The pt has Normal affect.   Non-Invasive Vascular Imaging:   Right leg bypass duplex demonstrates a patent bypass without any hemodynamically significant stenosis  ABI/TBIToday's ABIToday's TBI Previous ABIPrevious TBI  +-------+-----------+------------+------------+------------+  Right 1.38       not obtained1.01        0.55          +-------+-----------+------------+------------+------------+  Left  [place]         not obtained0.82        0.42          +-------+-----------+------------+------------+------------+   IVC is patent.  Portal veins dilated with pulsatile flow   ASSESSMENT/PLAN:: 73 y.o. male who was added on as an urgent triage patient due to bilateral lower extremity edema with history of right leg bypass  Todd [name] is a 73 year old male who is ill-appearing on exam and short of breath with labored breathing  and tripod position during exam.  He is currently being followed by general surgeon Dr. Freida Busman for large pancreatic cyst, PD dilation, chronic pancreatitis, and pancreatic stone.  He is also being worked up for pulmonary hypertension with cardiology with recent right heart cath.  He was added onto clinic schedule to evaluate circulation however I do not believe this is a current problem of his.  The right leg bypass is widely patent.  Given his pitting edema extending up both legs and affecting his lower abdomen we also checked to see if his IVC was patent by duplex.  IVC appears patent.  Furthermore his portal veins appeared dilated and had pulsatility by Doppler.  Given his  clinical appearance and past medical history I recommended he present to the emergency department for treatment.  I had a long discussion with his wife and discussed the poor prognosis given his current clinical condition.  She should at least begin discussions for palliative care if medical treatment is not desired by her husband.  I again strongly encouraged presentation to the emergency department and for now he will follow-up on an as-needed basis.   Emilie Rutter, PA-C [facility] [phone]  Clinic MD:   Chest[facility] Spore on call

## 2023-07-25 ENCOUNTER — Telehealth: Payer: Self-pay

## 2023-07-25 NOTE — Telephone Encounter (Signed)
Triage Call: Wife states that their daughter called the wrong doctor to talk about his fluid medicine but they know now.

## 2023-07-27 ENCOUNTER — Emergency Department (HOSPITAL_COMMUNITY): Payer: PPO

## 2023-07-27 ENCOUNTER — Encounter (HOSPITAL_COMMUNITY): Payer: Self-pay | Admitting: Internal Medicine

## 2023-07-27 ENCOUNTER — Ambulatory Visit (HOSPITAL_COMMUNITY)
Admission: RE | Admit: 2023-07-27 | Discharge: 2023-07-27 | Disposition: A | Discharge status: Home / Self Care | Payer: PPO | Source: Ambulatory Visit | Attending: Cardiology | Admitting: Cardiology

## 2023-07-27 ENCOUNTER — Inpatient Hospital Stay (HOSPITAL_COMMUNITY)
Admission: EM | Admit: 2023-07-27 | Discharge: 2023-08-02 | DRG: 253 | Disposition: A | Discharge status: Home / Self Care | Payer: PPO | Attending: Family Medicine | Admitting: Family Medicine

## 2023-07-27 ENCOUNTER — Other Ambulatory Visit: Payer: Self-pay

## 2023-07-27 DIAGNOSIS — Z79899 Other long term (current) drug therapy: Secondary | ICD-10-CM | POA: Diagnosis not present

## 2023-07-27 DIAGNOSIS — I739 Peripheral vascular disease, unspecified: Secondary | ICD-10-CM | POA: Diagnosis not present

## 2023-07-27 DIAGNOSIS — Z8249 Family history of ischemic heart disease and other diseases of the circulatory system: Secondary | ICD-10-CM | POA: Diagnosis not present

## 2023-07-27 DIAGNOSIS — Z95820 Peripheral vascular angioplasty status with implants and grafts: Secondary | ICD-10-CM

## 2023-07-27 DIAGNOSIS — T502X5A Adverse effect of carbonic-anhydrase inhibitors, benzothiadiazides and other diuretics, initial encounter: Secondary | ICD-10-CM | POA: Diagnosis not present

## 2023-07-27 DIAGNOSIS — K219 Gastro-esophageal reflux disease without esophagitis: Secondary | ICD-10-CM | POA: Diagnosis present

## 2023-07-27 DIAGNOSIS — I70245 Atherosclerosis of native arteries of left leg with ulceration of other part of foot: Principal | ICD-10-CM | POA: Diagnosis present

## 2023-07-27 DIAGNOSIS — E8770 Fluid overload, unspecified: Secondary | ICD-10-CM | POA: Diagnosis present

## 2023-07-27 DIAGNOSIS — L97529 Non-pressure chronic ulcer of other part of left foot with unspecified severity: Secondary | ICD-10-CM | POA: Diagnosis present

## 2023-07-27 DIAGNOSIS — K7460 Unspecified cirrhosis of liver: Secondary | ICD-10-CM | POA: Diagnosis present

## 2023-07-27 DIAGNOSIS — Z7902 Long term (current) use of antithrombotics/antiplatelets: Secondary | ICD-10-CM

## 2023-07-27 DIAGNOSIS — T424X5A Adverse effect of benzodiazepines, initial encounter: Secondary | ICD-10-CM | POA: Diagnosis not present

## 2023-07-27 DIAGNOSIS — S90822A Blister (nonthermal), left foot, initial encounter: Secondary | ICD-10-CM | POA: Diagnosis not present

## 2023-07-27 DIAGNOSIS — R601 Generalized edema: Secondary | ICD-10-CM | POA: Diagnosis present

## 2023-07-27 DIAGNOSIS — I7090 Unspecified atherosclerosis: Secondary | ICD-10-CM | POA: Diagnosis not present

## 2023-07-27 DIAGNOSIS — R188 Other ascites: Secondary | ICD-10-CM | POA: Diagnosis not present

## 2023-07-27 DIAGNOSIS — I509 Heart failure, unspecified: Principal | ICD-10-CM

## 2023-07-27 DIAGNOSIS — E785 Hyperlipidemia, unspecified: Secondary | ICD-10-CM | POA: Diagnosis not present

## 2023-07-27 DIAGNOSIS — Y92239 Unspecified place in hospital as the place of occurrence of the external cause: Secondary | ICD-10-CM | POA: Diagnosis not present

## 2023-07-27 DIAGNOSIS — F17200 Nicotine dependence, unspecified, uncomplicated: Secondary | ICD-10-CM | POA: Insufficient documentation

## 2023-07-27 DIAGNOSIS — N179 Acute kidney failure, unspecified: Secondary | ICD-10-CM | POA: Diagnosis not present

## 2023-07-27 DIAGNOSIS — Z7982 Long term (current) use of aspirin: Secondary | ICD-10-CM | POA: Diagnosis not present

## 2023-07-27 DIAGNOSIS — E876 Hypokalemia: Secondary | ICD-10-CM | POA: Diagnosis not present

## 2023-07-27 DIAGNOSIS — R0602 Shortness of breath: Secondary | ICD-10-CM | POA: Diagnosis not present

## 2023-07-27 DIAGNOSIS — I9589 Other hypotension: Secondary | ICD-10-CM | POA: Diagnosis not present

## 2023-07-27 DIAGNOSIS — I2721 Secondary pulmonary arterial hypertension: Secondary | ICD-10-CM | POA: Diagnosis not present

## 2023-07-27 DIAGNOSIS — M7989 Other specified soft tissue disorders: Secondary | ICD-10-CM | POA: Diagnosis not present

## 2023-07-27 DIAGNOSIS — I4891 Unspecified atrial fibrillation: Secondary | ICD-10-CM | POA: Diagnosis present

## 2023-07-27 DIAGNOSIS — Z720 Tobacco use: Secondary | ICD-10-CM

## 2023-07-27 DIAGNOSIS — K861 Other chronic pancreatitis: Secondary | ICD-10-CM | POA: Diagnosis not present

## 2023-07-27 DIAGNOSIS — F17210 Nicotine dependence, cigarettes, uncomplicated: Secondary | ICD-10-CM | POA: Diagnosis not present

## 2023-07-27 DIAGNOSIS — J90 Pleural effusion, not elsewhere classified: Secondary | ICD-10-CM | POA: Diagnosis not present

## 2023-07-27 DIAGNOSIS — Z7901 Long term (current) use of anticoagulants: Secondary | ICD-10-CM

## 2023-07-27 DIAGNOSIS — Z79890 Hormone replacement therapy (postmenopausal): Secondary | ICD-10-CM

## 2023-07-27 DIAGNOSIS — I4821 Permanent atrial fibrillation: Secondary | ICD-10-CM | POA: Diagnosis present

## 2023-07-27 DIAGNOSIS — E039 Hypothyroidism, unspecified: Secondary | ICD-10-CM | POA: Diagnosis present

## 2023-07-27 DIAGNOSIS — R0989 Other specified symptoms and signs involving the circulatory and respiratory systems: Secondary | ICD-10-CM | POA: Diagnosis not present

## 2023-07-27 DIAGNOSIS — R791 Abnormal coagulation profile: Secondary | ICD-10-CM | POA: Diagnosis not present

## 2023-07-27 DIAGNOSIS — K7689 Other specified diseases of liver: Secondary | ICD-10-CM | POA: Diagnosis not present

## 2023-07-27 LAB — PULMONARY FUNCTION TEST
DL/VA % pred: 51 %
DL/VA: 2.07 ml/min/mmHg/L
DLCO unc % pred: 17 %
DLCO unc: 4.15 ml/min/mmHg
FEF 25-75 Post: 1.2 L/s
FEF 25-75 Pre: 0.97 L/s
FEF2575-%Change-Post: 23 %
FEF2575-%Pred-Post: 55 %
FEF2575-%Pred-Pre: 45 %
FEV1-%Change-Post: 4 %
FEV1-%Pred-Post: 37 %
FEV1-%Pred-Pre: 36 %
FEV1-Post: 1.1 L
FEV1-Pre: 1.05 L
FEV1FVC-%Change-Post: 1 %
FEV1FVC-%Pred-Pre: 112 %
FEV6-%Change-Post: 3 %
FEV6-%Pred-Post: 35 %
FEV6-%Pred-Pre: 34 %
FEV6-Post: 1.32 L
FEV6-Pre: 1.28 L
FEV6FVC-%Pred-Post: 106 %
FEV6FVC-%Pred-Pre: 106 %
FVC-%Change-Post: 3 %
FVC-%Pred-Post: 33 %
FVC-%Pred-Pre: 32 %
FVC-Post: 1.32 L
FVC-Pre: 1.28 L
Post FEV1/FVC ratio: 83 %
Post FEV6/FVC ratio: 100 %
Pre FEV1/FVC ratio: 82 %
Pre FEV6/FVC Ratio: 100 %

## 2023-07-27 LAB — CBC
HCT: 38.4 % — ABNORMAL LOW (ref 39.0–52.0)
HCT: 37.7 % — ABNORMAL LOW (ref 39.0–52.0)
Hemoglobin: 11.5 g/dL — ABNORMAL LOW (ref 13.0–17.0)
Hemoglobin: 11.4 g/dL — ABNORMAL LOW (ref 13.0–17.0)
MCH: 27.8 pg (ref 26.0–34.0)
MCH: 27.9 pg (ref 26.0–34.0)
MCHC: 29.9 g/dL — ABNORMAL LOW (ref 30.0–36.0)
MCHC: 30.2 g/dL (ref 30.0–36.0)
MCV: 92.8 fL (ref 80.0–100.0)
MCV: 92.2 fL (ref 80.0–100.0)
Platelets: 174 10*3/uL (ref 150–400)
Platelets: 176 10*3/uL (ref 150–400)
RBC: 4.14 MIL/uL — ABNORMAL LOW (ref 4.22–5.81)
RBC: 4.09 MIL/uL — ABNORMAL LOW (ref 4.22–5.81)
RDW: 18.9 % — ABNORMAL HIGH (ref 11.5–15.5)
RDW: 18.9 % — ABNORMAL HIGH (ref 11.5–15.5)
WBC: 5.8 10*3/uL (ref 4.0–10.5)
WBC: 4.9 10*3/uL (ref 4.0–10.5)
nRBC: 0 % (ref 0.0–0.2)
nRBC: 0 % (ref 0.0–0.2)

## 2023-07-27 LAB — COMPREHENSIVE METABOLIC PANEL
ALT: 11 U/L (ref 0–44)
AST: 20 U/L (ref 15–41)
Albumin: 3.1 g/dL — ABNORMAL LOW (ref 3.5–5.0)
Alkaline Phosphatase: 61 U/L (ref 38–126)
Anion gap: 14 (ref 5–15)
BUN: 16 mg/dL (ref 8–23)
CO2: 26 mmol/L (ref 22–32)
Calcium: 8.5 mg/dL — ABNORMAL LOW (ref 8.9–10.3)
Chloride: 100 mmol/L (ref 98–111)
Creatinine, Ser: 1.39 mg/dL — ABNORMAL HIGH (ref 0.61–1.24)
GFR, Estimated: 54 mL/min — ABNORMAL LOW (ref >60)
Glucose, Bld: 89 mg/dL (ref 70–99)
Potassium: 3.5 mmol/L (ref 3.5–5.1)
Sodium: 140 mmol/L (ref 135–145)
Total Bilirubin: 0.6 mg/dL (ref 0.0–1.2)
Total Protein: 6.6 g/dL (ref 6.5–8.1)

## 2023-07-27 LAB — CREATININE, SERUM
Creatinine, Ser: 1.45 mg/dL — ABNORMAL HIGH (ref 0.61–1.24)
GFR, Estimated: 51 mL/min — ABNORMAL LOW (ref >60)

## 2023-07-27 LAB — BRAIN NATRIURETIC PEPTIDE: B Natriuretic Peptide: 1769.5 pg/mL — ABNORMAL HIGH (ref 0.0–100.0)

## 2023-07-27 LAB — TROPONIN I (HIGH SENSITIVITY): Troponin I (High Sensitivity): 41 ng/L — ABNORMAL HIGH (ref <18)

## 2023-07-27 MED ORDER — ENSURE MAX PROTEIN PO LIQD
1.0000 | Freq: Every day | ORAL | Status: DC
  Administered 2023-07-28 – 2023-08-01 (×3): 330 mL via ORAL
  Filled 2023-07-27 (×6): qty 330

## 2023-07-27 MED ORDER — ACETAMINOPHEN 650 MG RE SUPP: 650.0000 mg | Freq: Four times a day (QID) | RECTAL | Status: DC | PRN

## 2023-07-27 MED ORDER — ASPIRIN 81 MG PO TBEC
81.0000 mg | DELAYED_RELEASE_TABLET | Freq: Every day | ORAL | Status: DC
  Administered 2023-07-28 – 2023-08-02 (×6): 81 mg via ORAL
  Filled 2023-07-27 (×6): qty 1

## 2023-07-27 MED ORDER — DAPAGLIFLOZIN PROPANEDIOL 10 MG PO TABS
10.0000 mg | ORAL_TABLET | Freq: Every day | ORAL | Status: DC
  Administered 2023-07-28 – 2023-07-30 (×3): 10 mg via ORAL
  Filled 2023-07-27 (×4): qty 1

## 2023-07-27 MED ORDER — ENOXAPARIN SODIUM 40 MG/0.4ML IJ SOSY
40.0000 mg | PREFILLED_SYRINGE | INTRAMUSCULAR | Status: DC
  Administered 2023-07-28 – 2023-07-30 (×3): 40 mg via SUBCUTANEOUS
  Filled 2023-07-27 (×3): qty 0.4

## 2023-07-27 MED ORDER — PANCRELIPASE (LIP-PROT-AMYL) 36000-114000 UNITS PO CPEP
72000.0000 [IU] | ORAL_CAPSULE | Freq: Three times a day (TID) | ORAL | Status: DC
  Administered 2023-07-28 – 2023-08-02 (×10): 72000 [IU] via ORAL
  Filled 2023-07-27 (×14): qty 2

## 2023-07-27 MED ORDER — ALBUTEROL SULFATE (2.5 MG/3ML) 0.083% IN NEBU
2.5000 mg | INHALATION_SOLUTION | Freq: Once | RESPIRATORY_TRACT | Status: AC
  Administered 2023-07-27: 2.5 mg via RESPIRATORY_TRACT

## 2023-07-27 MED ORDER — ROSUVASTATIN CALCIUM 5 MG PO TABS
10.0000 mg | ORAL_TABLET | Freq: Every day | ORAL | Status: DC
  Administered 2023-07-28 – 2023-08-02 (×5): 10 mg via ORAL
  Filled 2023-07-27 (×5): qty 2

## 2023-07-27 MED ORDER — VITAMIN D 25 MCG (1000 UNIT) PO TABS
1000.0000 [IU] | ORAL_TABLET | Freq: Every day | ORAL | Status: DC
Start: 2023-07-28 — End: 2023-08-02
  Administered 2023-07-28 – 2023-08-02 (×5): 1000 [IU] via ORAL
  Filled 2023-07-27 (×5): qty 1

## 2023-07-27 MED ORDER — FUROSEMIDE 10 MG/ML IJ SOLN
60.0000 mg | Freq: Once | INTRAMUSCULAR | Status: AC
  Administered 2023-07-27: 60 mg via INTRAVENOUS
  Filled 2023-07-27: qty 6

## 2023-07-27 MED ORDER — POTASSIUM CHLORIDE CRYS ER 20 MEQ PO TBCR
40.0000 meq | EXTENDED_RELEASE_TABLET | Freq: Once | ORAL | Status: AC
  Administered 2023-07-27: 40 meq via ORAL
  Filled 2023-07-27: qty 2

## 2023-07-27 MED ORDER — PANTOPRAZOLE SODIUM 40 MG PO TBEC
40.0000 mg | DELAYED_RELEASE_TABLET | Freq: Two times a day (BID) | ORAL | Status: DC
  Administered 2023-07-28 – 2023-08-02 (×11): 40 mg via ORAL
  Filled 2023-07-27 (×11): qty 1

## 2023-07-27 MED ORDER — SODIUM CHLORIDE 0.9% FLUSH
3.0000 mL | Freq: Two times a day (BID) | INTRAVENOUS | Status: DC
  Administered 2023-07-27 – 2023-08-01 (×10): 3 mL via INTRAVENOUS

## 2023-07-27 MED ORDER — NICOTINE POLACRILEX 2 MG MT GUM: 2.0000 mg | CHEWING_GUM | OROMUCOSAL | Status: DC | PRN

## 2023-07-27 MED ORDER — POLYETHYLENE GLYCOL 3350 17 G PO PACK
17.0000 g | PACK | Freq: Every day | ORAL | Status: DC | PRN
Start: 2023-07-27 — End: 2023-08-02

## 2023-07-27 MED ORDER — CLOPIDOGREL BISULFATE 75 MG PO TABS
75.0000 mg | ORAL_TABLET | Freq: Every day | ORAL | Status: DC
  Administered 2023-07-28 – 2023-08-02 (×5): 75 mg via ORAL
  Filled 2023-07-27 (×5): qty 1

## 2023-07-27 MED ORDER — LEVOTHYROXINE SODIUM 25 MCG PO TABS
25.0000 ug | ORAL_TABLET | Freq: Every day | ORAL | Status: DC
  Administered 2023-07-28 – 2023-08-02 (×6): 25 ug via ORAL
  Filled 2023-07-27 (×6): qty 1

## 2023-07-27 MED ORDER — ACETAMINOPHEN 325 MG PO TABS
650.0000 mg | ORAL_TABLET | Freq: Four times a day (QID) | ORAL | Status: DC | PRN
  Administered 2023-07-28: 650 mg via ORAL
  Filled 2023-07-27: qty 2

## 2023-07-27 MED ORDER — DEXTROSE 5 % IV SOLN
10.0000 mg/h | INTRAVENOUS | Status: DC
  Administered 2023-07-28: 10 mg/h via INTRAVENOUS
  Filled 2023-07-27: qty 20

## 2023-07-27 NOTE — Assessment & Plan Note (Signed)
Diagnosis based on echo November 2024 - this will need further work up routinely. Currently needs to be diuresed first.

## 2023-07-27 NOTE — Assessment & Plan Note (Signed)
Nicotine replacement therapy ordered.  Advised to quit.

## 2023-07-27 NOTE — Assessment & Plan Note (Addendum)
Patient has had chronic lower extremity edema.  However noted to have worsening edema today complicated by weeping as well as right-sided pleural effusion on chest x-ray.  No fever no white count.  Troponin is not actionable at this time.  Will trend the value.  Urine protein testing pending.  No marked LFT abnormality thus far.  Pending INR and PTT in the morning.  Recent echo on file from November 2024 with ejection fraction 70 to 75%.  Left ventricular hypertrophy of basal and septal segments.  Patient does seem to have chronic severely elevated pulmonary artery systolic pressure of 77.4 mmHg as well as left and right atrial dilatation.  My suspicion is that the patient's fluid overload has been precipitated by nonadherence to prescribed diuretic regimen.  Patient s/p 60 mg of Lasix in the ER.  However given the degree of patient's fluid overload, I believe patient needs to be started on 10 mg/h of Lasix infusion.  Will monitor clinical response, monitor magnesium level.

## 2023-07-27 NOTE — Consult Note (Signed)
Hospital Consult    Reason for Consult:  No DP doppler signal left foot  Referring Physician:  ED MRN #:  [id number]  History of Present Illness: This is a 73 y.o. male with history of atrial fibrillation, carotid artery disease, PAD, CHF with pulmonary hypertension that vascular surgery has been consulted for no DP doppler signal in the left foot.  Patient tells me he is here due to bilateral lower extremity edema with leg swelling and blistering as well as shortness of breath.  He was just seen in the office last week by our PA who recommended going to the ED due to his shortness of breath and lower extremity edema.  Patient has history of bilateral external iliac artery stenting in 2020 by Dr. Darrick Penna.  He underwent additional left external iliac stenting in 2022 by Dr. Myra Gianotti.  He then had right femoral endarterectomy with right femoral to above-knee popliteal bypass with PTFE with right iliac stenting on 03/12/2021 by Dr. Myra Gianotti.  Has had a left carotid endarterectomy in 2023 with Dr. Myra Gianotti.  Seen in the office last week and his right leg bypass was widely patent.  His ABI's last week were 1.38 on the right biphasic and noncompressible on the left.  Past Medical History:  Diagnosis Date   Alcoholism [facility])    History of withdrawal and seizures   Anemia    Atrial fibrillation (HCC)    Taken off of Coumadin in 2009 in the setting of GI bleed   Carotid artery occlusion    Chronic back pain    Colitis    4/11   Dysrhythmia    Essential hypertension    Gout    Heart murmur    born with no issues   History of GI bleed    Hypothyroidism    Internal hemorrhoids    Colonoscopy 11/09   Osteoarthritis    Peripheral vascular disease (HCC)    Schatzki's ring    EGD 11/09    Past Surgical History:  Procedure Laterality Date   ABDOMINAL AORTOGRAM N/A 03/08/2019   Procedure: ABDOMINAL AORTOGRAM;  Surgeon: Sherren Kerns, MD;  [facility]: [facility];  Service: Cardiovascular;   Laterality: N/A;   ABDOMINAL AORTOGRAM W/LOWER EXTREMITY N/A 03/02/2021   Procedure: ABDOMINAL AORTOGRAM W/LOWER EXTREMITY;  Surgeon: Nada Libman, MD;  [facility]: [facility];  Service: Cardiovascular;  Laterality: N/A;   APPLICATION OF WOUND VAC Right 07/22/2021   Procedure: APPLICATION OF WOUND VAC;  Surgeon: Nada Libman, MD;  Location: [facility];  Service: Vascular;  Laterality: Right;   BIOPSY  10/20/2022   Procedure: BIOPSY;  Surgeon: Lanelle Bal, DO;  [facility]: [facility];  Service: Endoscopy;;   COLONOSCOPY WITH PROPOFOL N/A 10/20/2022   Procedure: COLONOSCOPY WITH PROPOFOL;  Surgeon: Lanelle Bal, DO;  [facility];  Service: Endoscopy;  Laterality: N/A;   COLONOSCOPY WITH PROPOFOL N/A 12/09/2022   Procedure: COLONOSCOPY WITH PROPOFOL;  Surgeon: Lanelle Bal, DO;  [facility];  Service: Endoscopy;  Laterality: N/A;  10:15 AM, ASA 3   ENDARTERECTOMY Left 02/02/2022   Procedure: LEFT ENDARTERECTOMY CAROTID;  Surgeon: Nada Libman, MD;  [facility]: [facility];  Service: Vascular;  Laterality: Left;   ESOPHAGOGASTRODUODENOSCOPY (EGD) WITH PROPOFOL N/A 10/20/2022   Procedure: ESOPHAGOGASTRODUODENOSCOPY (EGD) WITH PROPOFOL;  Surgeon: Lanelle Bal, DO;  [facility];  Service: Endoscopy;  Laterality: N/A;   ESOPHAGOGASTRODUODENOSCOPY (EGD) WITH PROPOFOL N/A 02/07/2023   Procedure:  ESOPHAGOGASTRODUODENOSCOPY (EGD) WITH PROPOFOL;  Surgeon: Mansouraty, Netty Starring., MD;  Location: [facility];  Service: Gastroenterology;  Laterality: N/A;   EUS N/A 02/07/2023   Procedure: UPPER ENDOSCOPIC ULTRASOUND (EUS) RADIAL;  Surgeon: Lemar Lofty., MD;  Location: [facility];  Service: Gastroenterology;  Laterality: N/A;   Excision of gouty lesion     Left index finger   FEMORAL-POPLITEAL BYPASS GRAFT Right 03/12/2021   Procedure: RIGHT FEMORAL TO POPLITEAL ARTERY BYPASS GRAFTING USING THE PROPATEN GRAFT;  Surgeon: Nada Libman, MD;   [facility];  Service: Vascular;  Laterality: Right;   FINE NEEDLE ASPIRATION N/A 02/07/2023   Procedure: FINE NEEDLE ASPIRATION (FNA) LINEAR;  Surgeon: Lemar Lofty., MD;  Location: [facility];  Service: Gastroenterology;  Laterality: N/A;   GROIN DEBRIDEMENT Right 07/22/2021   Procedure: RIGHT GROIN DEBRIDEMENT;  Surgeon: Nada Libman, MD;  [facility];  Service: Vascular;  Laterality: Right;   HOT HEMOSTASIS N/A 02/07/2023   Procedure: HOT HEMOSTASIS (ARGON PLASMA COAGULATION/BICAP);  Surgeon: Lemar Lofty., MD;  Location: [facility];  Service: Gastroenterology;  Laterality: N/A;   INSERTION OF ILIAC STENT Right 03/12/2021   Procedure: INSERTION OF  EXTERNAL ILIAC STENT;  Surgeon: Nada Libman, MD;  [facility];  Service: Vascular;  Laterality: Right;   LOWER EXTREMITY ANGIOGRAM Right 03/12/2021   Procedure: LOWER EXTREMITY ANGIOGRAM;  Surgeon: Nada Libman, MD;  [facility];  Service: Vascular;  Laterality: Right;   LOWER EXTREMITY ANGIOGRAPHY Bilateral 03/08/2019   Procedure: Lower Extremity Angiography;  Surgeon: Sherren Kerns, MD;  Location: [facility] INVASIVE CV LAB;  Service: Cardiovascular;  Laterality: Bilateral;   LOWER EXTREMITY ANGIOGRAPHY N/A 03/15/2021   Procedure: LOWER EXTREMITY ANGIOGRAPHY;  Surgeon: Maeola Harman, MD;  Location: [facility] INVASIVE CV LAB;  Service: Cardiovascular;  Laterality: N/A;   MASS EXCISION Right 07/07/2020   Procedure: EXCISION OF RIGHT FACIAL MASS;  Surgeon: Newman Pies, MD;  Location: [facility];  Service: ENT;  Laterality: Right;   PATCH ANGIOPLASTY Left 02/02/2022   Procedure: PATCH ANGIOPLASTY OF LEFT CAROTID USING XENOSURE BOVINE PERICARDIUM PATCH;  Surgeon: Nada Libman, MD;  [facility];  Service: Vascular;  Laterality: Left;   PERIPHERAL VASCULAR BALLOON ANGIOPLASTY Right 03/02/2021   Procedure: PERIPHERAL VASCULAR BALLOON ANGIOPLASTY;  Surgeon: Nada Libman, MD;  [facility];  Service: Cardiovascular;  Laterality: Right;  external iliac   PERIPHERAL VASCULAR INTERVENTION Bilateral 03/08/2019   Procedure: PERIPHERAL VASCULAR INTERVENTION;  Surgeon: Sherren Kerns, MD;  [facility];  Service: Cardiovascular;  Laterality: Bilateral;  ext iliac artery stent   PERIPHERAL VASCULAR INTERVENTION Left 03/02/2021   Procedure: PERIPHERAL VASCULAR INTERVENTION;  Surgeon: Nada Libman, MD;  [facility];  Service: Cardiovascular;  Laterality: Left;  external iliac   PERIPHERAL VASCULAR INTERVENTION Right 03/15/2021   Procedure: PERIPHERAL VASCULAR INTERVENTION;  Surgeon: Maeola Harman, MD;  Location: [facility] INVASIVE CV LAB;  Service: Cardiovascular;  Laterality: Right;  external iliac   POLYPECTOMY  12/09/2022   Procedure: POLYPECTOMY INTESTINAL;  Surgeon: Lanelle Bal, DO;  [facility];  Service: Endoscopy;;   RIGHT HEART CATH N/A 06/12/2023   Procedure: RIGHT HEART CATH;  Surgeon: Romie Minus, MD;  Location: [facility] INVASIVE CV LAB;  Service: Cardiovascular;  Laterality: N/A;   Right knee arthroscopy     SPINE SURGERY      No Known Allergies  Prior to Admission medications  Medication Sig Start Date End Date Taking? Authorizing Provider  aspirin EC 81 MG EC tablet Take 1 tablet (81 mg total) by mouth daily at 6 (six) AM. Swallow whole. 03/17/21   Setzer, Lynnell Jude, PA-C  cholecalciferol (VITAMIN D3) 25 MCG (1000 UNIT) tablet Take 1,000 Units by mouth daily.    [provider]  clopidogrel (PLAVIX) 75 MG tablet TAKE ONE TABLET BY MOUTH ONCE DAILY. 05/05/23   Jonelle Sidle, MD  diclofenac Sodium (VOLTAREN) 1 % GEL Apply 4 g topically 4 (four) times daily as needed (pain). 01/31/23   [provider]  Ensure (ENSURE) Take 1 Can by mouth daily.    [provider]  FARXIGA 10 MG TABS tablet Take 1 tablet (10 mg total) by mouth daily before breakfast. 05/30/23   Romie Minus, MD  furosemide (LASIX) 20 MG tablet Take 1 tablet (20 mg total) by mouth daily. 06/12/23   Romie Minus, MD  levothyroxine (SYNTHROID) 25 MCG tablet Take 25 mcg by mouth daily before breakfast.    [provider]  lipase/protease/amylase (CREON) 36000 UNITS CPEP capsule Take 2 capsules (72,000 Units total) by mouth 3 (three) times daily before meals. And 1 capsule with snacks 03/15/23 03/14/24  Lanelle Bal, DO  pantoprazole (PROTONIX) 40 MG tablet Take 1 tablet (40 mg total) by mouth 2 (two) times daily. 11/03/22 11/03/23  Lanelle Bal, DO  rosuvastatin (CRESTOR) 10 MG tablet TAKE (1) TABLET BY MOUTH ONCE DAILY. 09/09/20   Maeola Harman, MD    Social History   Socioeconomic History   Marital status: Married    Spouse name: Not on file   Number of children: 3   Years of education: Not on file   Highest education level: Not on file  Occupational History   Occupation: Retired    Employer: [employer]  Tobacco Use   Smoking status: Every Day    Current packs/day: 0.50    Types: Cigarettes    Passive exposure: Current   Smokeless tobacco: Never   Tobacco comments:    0.25-0.5 packs per day  Vaping Use   Vaping status: Never Used  Substance and Sexual Activity   Alcohol use: Yes    Alcohol/week: 5.0 standard drinks of alcohol    Types: 5 Cans of beer per week    Comment: less than a 6 pack of beer per week   Drug use: No   Sexual activity: Not Currently  Other Topics Concern   Not on file  Social History Narrative   Not on file   Social Drivers of Health   Financial Resource Strain: Not on file  Food Insecurity: No Food Insecurity (10/17/2022)   Hunger Vital Sign    Worried About Running Out of Food in the Last Year: Never true    Ran Out of Food in the Last Year: Never true  Transportation Needs: No Transportation Needs (10/17/2022)   PRAPARE - Administrator, Civil Service (Medical): No    Lack of  Transportation (Non-Medical): No  Physical Activity: Not on file  Stress: Not on file  Social Connections: Not on file  Intimate Partner Violence: Not At Risk (10/17/2022)   Humiliation, Afraid, Rape, and Kick questionnaire    Fear of Current or Ex-Partner: No    Emotionally Abused: No    Physically Abused: No    Sexually Abused: No     Family History  Problem Relation Age of Onset   Coronary  artery disease Father    Coronary artery disease Sister        MI at age 14    ROS: [x]  Positive   [ ]  Negative   [ ]  All sytems reviewed and are negative  Cardiovascular: []  chest pain/pressure []  palpitations [x]  SOB lying flat []  DOE []  pain in legs while walking []  pain in legs at rest []  pain in legs at night []  non-healing ulcers []  hx of DVT [x]  swelling in legs  Pulmonary: []  productive cough []  asthma/wheezing []  home O2  Neurologic: []  weakness in []  arms []  legs []  numbness in []  arms []  legs []  hx of CVA []  mini stroke [] difficulty speaking or slurred speech []  temporary loss of vision in one eye []  dizziness  Hematologic: []  hx of cancer []  bleeding problems []  problems with blood clotting easily  Endocrine:   []  diabetes []  thyroid disease  GI []  vomiting blood []  blood in stool  GU: []  CKD/renal failure []  HD--[]  M/W/F or []  T/T/S []  burning with urination []  blood in urine  Psychiatric: []  anxiety []  depression  Musculoskeletal: []  arthritis []  joint pain  Integumentary: []  rashes []  ulcers  Constitutional: []  fever []  chills   Physical Examination  Vitals:   07/27/23 1252 07/27/23 1628  BP: (!) 117/102 119/74  Pulse: 87 82  Resp: 16 (!) 22  Temp: 97.8 F (36.6 C) (!) 97.4 F (36.3 C)  SpO2: 100% 98%   There is no height or weight on file to calculate BMI.  General:  NAD Gait: Not observed HENT: WNL, normocephalic Pulmonary: increased WOB at baseline Cardiac: regular, without  Murmurs, rubs or gallops Abdomen:  soft,  NT/ND Vascular Exam/Pulses: Bilateral femoral pulses palpable Right PT brisk and multiphasic Left PT by Doppler Left capillary refill in the toe normal Bilateral lower extremity blistering Wounds on the left first and second toe Musculoskeletal: no muscle wasting or atrophy  Neurologic: A&O X 3; Appropriate Affect ; SENSATION: normal; MOTOR FUNCTION:  moving all extremities equally. Speech is fluent/normal    CBC    Component Value Date/Time   WBC 5.8 07/27/2023 1315   RBC 4.14 (L) 07/27/2023 1315   HGB 11.5 (L) 07/27/2023 1315   HCT 38.4 (L) 07/27/2023 1315   PLT 174 07/27/2023 1315   MCV 92.8 07/27/2023 1315   MCH 27.8 07/27/2023 1315   MCHC 29.9 (L) 07/27/2023 1315   RDW 18.9 (H) 07/27/2023 1315   LYMPHSABS 1.6 10/17/2022 1202   MONOABS 0.7 10/17/2022 1202   EOSABS 0.0 10/17/2022 1202   BASOSABS 0.1 10/17/2022 1202    BMET    Component Value Date/Time   NA 140 07/27/2023 1315   K 3.5 07/27/2023 1315   CL 100 07/27/2023 1315   CO2 26 07/27/2023 1315   GLUCOSE 89 07/27/2023 1315   BUN 16 07/27/2023 1315   CREATININE 1.39 (H) 07/27/2023 1315   CREATININE 1.25 03/08/2023 1041   CALCIUM 8.5 (L) 07/27/2023 1315   GFRNONAA 54 (L) 07/27/2023 1315   GFRAA  10/06/2010 1259    >60        The eGFR has been calculated using the MDRD equation. This calculation has not been validated in all clinical situations. eGFR's persistently <60 mL/min signify possible Chronic Kidney Disease.    COAGS: Lab Results  Component Value Date   INR 1.2 10/17/2022   INR 1.1 01/25/2022   INR 1.0 03/11/2021     Non-Invasive Vascular Imaging:      ASSESSMENT/PLAN:  This is a 73 y.o. male with history of atrial fibrillation, carotid artery disease, PAD, CHF with pulmonary hypertension that vascular surgery has been consulted for no DP doppler signal in the left foot.  On exam he has a pretty brisk PT Doppler signal in the left foot and it is motor intact with good capillary refill.   ABI were non-compressible in the left leg last week. I think his bigger issue is shortness of breath and bilateral lower extremity edema with blistering with evidence of heart failure and this is the main reason for his presentation.  Wife states his diuretic therapy was recently adjusted and decreased by his cardiologist.  Right bypass graft patent by duplex in the office last week and has a good Doppler signal in the right foot.  Can order left leg arterial duplex and ensure that he has adequate inflow to heal his left first and second toe wounds as pictured above.  If not may require angiogram next week when more optimized from a medical standpoint.  All of this is a chronic presentation with no indication for urgent revascularization.  Cephus Shelling, MD [facility] Office: [phone]  Cephus Shelling

## 2023-07-27 NOTE — Assessment & Plan Note (Signed)
Baseline creatinine around 1, current creatinine 1.39.  Patient appears to be fluid overloaded on account of lower extremity edema.  Will check bladder scan urinalysis sodium creatinine intake output and daily weights.

## 2023-07-27 NOTE — Assessment & Plan Note (Signed)
This is chronic, with remote history of right-sided bypass done.  Patient noted to have cool extremities today.  But no resting pain is reported no acute skin changes except for blister formation.  Patient had feeble pulses on the dorsalis pedis on the left side.  See vascular surgery evaluation in the chart.  Appreciate consult.  Patient is pending arterial duplex.  Given lower extremity swelling I will also perform venous duplex at the same time.

## 2023-07-27 NOTE — ED Triage Notes (Addendum)
Pt/wife stated Ive had sores and blisters for years from body fluid over load. They are not able to get rid of the fluid.Marland Kitchen He has fluid pills from 3 per day and as needed. Now he is on 20mg  Lasix. A day. It makes him hold more fluid.  He is SOB all the time due to the fluid

## 2023-07-27 NOTE — ED Provider Triage Note (Signed)
Emergency Medicine Provider Triage Evaluation Note  Nunzio Cory III , a 73 y.o. male  was evaluated in triage.  Pt complains of left foot blister that has been worsening over past week and "bloated".  Decreased lasix from TID to every day in December. Sob at rest. Current tobacco use  Review of Systems  Positive: Pedal edema Negative: fevers  Physical Exam  BP (!) 117/102 (BP Location: Right Arm)   Pulse 87   Temp 97.8 F (36.6 C) (Oral)   Resp 16   SpO2 100%  Gen:   Awake, no distress   Resp:  Normal effort  MSK:   Moves extremities without difficulty  Other:  Bilat 2+ pitting edema. One large blister with serosanguinous fluid on dorsal aspect of left foot  Medical Decision Making  Medically screening exam initiated at 12:58 PM.  Appropriate orders placed.  Nunzio Cory III was informed that the remainder of the evaluation will be completed by another provider, this initial triage assessment does not replace that evaluation, and the importance of remaining in the ED until their evaluation is complete.  Workup started   Judithann Sheen, [place] 07/27/23 [phone]

## 2023-07-27 NOTE — Assessment & Plan Note (Signed)
This is chronic, due to remote GI bleed, patient has been kept off of anticoagulation.

## 2023-07-27 NOTE — H&P (Signed)
History and Physical    Patient: Todd [name] DOB: 03-Jan-1951 DOA: 07/27/2023 DOS: the patient was seen and examined on 07/27/2023 PCP: Elfredia Nevins, MD  Patient coming from: Home  Chief Complaint:  Chief Complaint  Patient presents with   Foot Pain   Shortness of Breath   HPI: Todd [name] is a 73 y.o. male with medical history significant hypertension, hyperlipidemia, alcohol abuse, tobacco use, PAD , remote peripheral arterial disease repaired by surgery.  Patient also seems to have a diagnosis of remote upper GI bleed s/p endoscopy as well as pancreatic cyst, pancreatic insufficiency, and PD dilatation as well as elevated CA 19-9. He had an obstructing stone in his pancreatic head as well. (Followed at [facility]) he has permanent atrial fibrillation.  However he is not on anticoagulation given past GI bleeding.  He has chronic lower extremity edema, prior ejection fractions have been normal.  Patient seems to be an extremely limited historian, unfortunately gives unrelated answers most of the time.  My understanding is that the patient has not been taking his Lasix as prescribed, instead has been taking it on an as needed basis.  For the last couple of weeks he has developed progressive lower extremity swelling to the extent that there is weeping on the left leg now as well as blister formation.  See media section of the chart for photographic documentation.  Patient denies any fever rigors chest pain palpitation or shortness of breath.  Patient came to the ER for further evaluation of blistering of the legs.  He does not report marked pain or any other skin changes. Review of Systems: As mentioned in the history of present illness. All other systems reviewed and are negative. Past Medical History:  Diagnosis Date   Alcoholism [facility])    History of withdrawal and seizures   Anemia    Atrial fibrillation (HCC)    Taken off of Coumadin in 2009 in the setting of GI  bleed   Carotid artery occlusion    Chronic back pain    Colitis    4/11   Dysrhythmia    Essential hypertension    Gout    Heart murmur    born with no issues   History of GI bleed    Hypothyroidism    Internal hemorrhoids    Colonoscopy 11/09   Osteoarthritis    Peripheral vascular disease (HCC)    Schatzki's ring    EGD 11/09   Past Surgical History:  Procedure Laterality Date   ABDOMINAL AORTOGRAM N/A 03/08/2019   Procedure: ABDOMINAL AORTOGRAM;  Surgeon: Sherren Kerns, MD;  [facility];  Service: Cardiovascular;  Laterality: N/A;   ABDOMINAL AORTOGRAM W/LOWER EXTREMITY N/A 03/02/2021   Procedure: ABDOMINAL AORTOGRAM W/LOWER EXTREMITY;  Surgeon: Nada Libman, MD;  [facility]: [facility];  Service: Cardiovascular;  Laterality: N/A;   APPLICATION OF WOUND VAC Right 07/22/2021   Procedure: APPLICATION OF WOUND VAC;  Surgeon: Nada Libman, MD;  Location: [facility] OR;  Service: Vascular;  Laterality: Right;   BIOPSY  10/20/2022   Procedure: BIOPSY;  Surgeon: Lanelle Bal, DO;  [facility]: [facility];  Service: Endoscopy;;   COLONOSCOPY WITH PROPOFOL N/A 10/20/2022   Procedure: COLONOSCOPY WITH PROPOFOL;  Surgeon: Lanelle Bal, DO;  [facility];  Service: Endoscopy;  Laterality: N/A;   COLONOSCOPY WITH PROPOFOL N/A 12/09/2022   Procedure: COLONOSCOPY WITH PROPOFOL;  Surgeon: Lanelle Bal, DO;  [facility];  Service: Endoscopy;  Laterality: N/A;  10:15 AM, ASA 3   ENDARTERECTOMY Left 02/02/2022   Procedure: LEFT ENDARTERECTOMY CAROTID;  Surgeon: Nada Libman, MD;  [facility];  Service: Vascular;  Laterality: Left;   ESOPHAGOGASTRODUODENOSCOPY (EGD) WITH PROPOFOL N/A 10/20/2022   Procedure: ESOPHAGOGASTRODUODENOSCOPY (EGD) WITH PROPOFOL;  Surgeon: Lanelle Bal, DO;  [facility];  Service: Endoscopy;  Laterality: N/A;   ESOPHAGOGASTRODUODENOSCOPY (EGD) WITH PROPOFOL N/A 02/07/2023   Procedure:  ESOPHAGOGASTRODUODENOSCOPY (EGD) WITH PROPOFOL;  Surgeon: Meridee Score Netty Starring., MD;  Location: [facility];  Service: Gastroenterology;  Laterality: N/A;   EUS N/A 02/07/2023   Procedure: UPPER ENDOSCOPIC ULTRASOUND (EUS) RADIAL;  Surgeon: Lemar Lofty., MD;  Location: [facility];  Service: Gastroenterology;  Laterality: N/A;   Excision of gouty lesion     Left index finger   FEMORAL-POPLITEAL BYPASS GRAFT Right 03/12/2021   Procedure: RIGHT FEMORAL TO POPLITEAL ARTERY BYPASS GRAFTING USING THE PROPATEN GRAFT;  Surgeon: Nada Libman, MD;  [facility];  Service: Vascular;  Laterality: Right;   FINE NEEDLE ASPIRATION N/A 02/07/2023   Procedure: FINE NEEDLE ASPIRATION (FNA) LINEAR;  Surgeon: Lemar Lofty., MD;  Location: [facility];  Service: Gastroenterology;  Laterality: N/A;   GROIN DEBRIDEMENT Right 07/22/2021   Procedure: RIGHT GROIN DEBRIDEMENT;  Surgeon: Nada Libman, MD;  Location[facility] OR;  Service: Vascular;  Laterality: Right;   HOT HEMOSTASIS N/A 02/07/2023   Procedure: HOT HEMOSTASIS (ARGON PLASMA COAGULATION/BICAP);  Surgeon: Lemar Lofty., MD;  Location: [facility];  Service: Gastroenterology;  Laterality: N/A;   INSERTION OF ILIAC STENT Right 03/12/2021   Procedure: INSERTION OF  EXTERNAL ILIAC STENT;  Surgeon: Nada Libman, MD;  [facility];  Service: Vascular;  Laterality: Right;   LOWER EXTREMITY ANGIOGRAM Right 03/12/2021   Procedure: LOWER EXTREMITY ANGIOGRAM;  Surgeon: Nada Libman, MD;  [facility];  Service: Vascular;  Laterality: Right;   LOWER EXTREMITY ANGIOGRAPHY Bilateral 03/08/2019   Procedure: Lower Extremity Angiography;  Surgeon: Sherren Kerns, MD;  Location: [facility] INVASIVE CV LAB;  Service: Cardiovascular;  Laterality: Bilateral;   LOWER EXTREMITY ANGIOGRAPHY N/A 03/15/2021   Procedure: LOWER EXTREMITY ANGIOGRAPHY;  Surgeon: Maeola Harman, MD;  Location: [facility] INVASIVE CV LAB;  Service:  Cardiovascular;  Laterality: N/A;   MASS EXCISION Right 07/07/2020   Procedure: EXCISION OF RIGHT FACIAL MASS;  Surgeon: Newman Pies, MD;  Location: [facility];  Service: ENT;  Laterality: Right;   PATCH ANGIOPLASTY Left 02/02/2022   Procedure: PATCH ANGIOPLASTY OF LEFT CAROTID USING XENOSURE BOVINE PERICARDIUM PATCH;  Surgeon: Nada Libman, MD;  [facility];  Service: Vascular;  Laterality: Left;   PERIPHERAL VASCULAR BALLOON ANGIOPLASTY Right 03/02/2021   Procedure: PERIPHERAL VASCULAR BALLOON ANGIOPLASTY;  Surgeon: Nada Libman, MD;  [facility];  Service: Cardiovascular;  Laterality: Right;  external iliac   PERIPHERAL VASCULAR INTERVENTION Bilateral 03/08/2019   Procedure: PERIPHERAL VASCULAR INTERVENTION;  Surgeon: Sherren Kerns, MD;  [facility];  Service: Cardiovascular;  Laterality: Bilateral;  ext iliac artery stent   PERIPHERAL VASCULAR INTERVENTION Left 03/02/2021   Procedure: PERIPHERAL VASCULAR INTERVENTION;  Surgeon: Nada Libman, MD;  [facility];  Service: Cardiovascular;  Laterality: Left;  external iliac   PERIPHERAL VASCULAR INTERVENTION Right 03/15/2021   Procedure: PERIPHERAL VASCULAR INTERVENTION;  Surgeon: Maeola Harman, MD;  Location: [facility] INVASIVE CV LAB;  Service: Cardiovascular;  Laterality: Right;  external iliac   POLYPECTOMY  12/09/2022   Procedure: POLYPECTOMY INTESTINAL;  Surgeon: Lanelle Bal, DO;  [facility];  Service: Endoscopy;;   RIGHT HEART CATH N/A 06/12/2023   Procedure: RIGHT HEART CATH;  Surgeon: Romie Minus, MD;  Location: [facility] INVASIVE CV LAB;  Service: Cardiovascular;  Laterality: N/A;   Right knee arthroscopy     SPINE SURGERY     Social History:  reports that he has been smoking cigarettes. He has been exposed to tobacco smoke. He has never used smokeless tobacco. He reports current alcohol use of about 5.0 standard drinks of alcohol per week.  He reports that he does not use drugs.  No Known Allergies  Family History  Problem Relation Age of Onset   Coronary artery disease Father    Coronary artery disease Sister        MI at age 30    Prior to Admission medications   Medication Sig Start Date End Date Taking? Authorizing Provider  aspirin EC 81 MG EC tablet Take 1 tablet (81 mg total) by mouth daily at 6 (six) AM. Swallow whole. 03/17/21  Yes Setzer, Lynnell Jude, PA-C  cholecalciferol (VITAMIN D3) 25 MCG (1000 UNIT) tablet Take 1,000 Units by mouth daily.   Yes [provider]  clopidogrel (PLAVIX) 75 MG tablet TAKE ONE TABLET BY MOUTH ONCE DAILY. 05/05/23  Yes Jonelle Sidle, MD  diclofenac Sodium (VOLTAREN) 1 % GEL Apply 4 g topically 4 (four) times daily as needed (pain). 01/31/23  Yes [provider]  Ensure (ENSURE) Take 1 Can by mouth daily.   Yes [provider]  FARXIGA 10 MG TABS tablet Take 1 tablet (10 mg total) by mouth daily before breakfast. 05/30/23  Yes Romie Minus, MD  furosemide (LASIX) 20 MG tablet Take 1 tablet (20 mg total) by mouth daily. 06/12/23  Yes Romie Minus, MD  levothyroxine (SYNTHROID) 25 MCG tablet Take 25 mcg by mouth daily before breakfast.   Yes [provider]  lipase/protease/amylase (CREON) 36000 UNITS CPEP capsule Take 2 capsules (72,000 Units total) by mouth 3 (three) times daily before meals. And 1 capsule with snacks 03/15/23 03/14/24 Yes Carver, Kijuan K, DO  pantoprazole (PROTONIX) 40 MG tablet Take 1 tablet (40 mg total) by mouth 2 (two) times daily. 11/03/22 11/03/23 Yes Carver, Greogory K, DO  rosuvastatin (CRESTOR) 10 MG tablet TAKE (1) TABLET BY MOUTH ONCE DAILY. 09/09/20  Yes Maeola Harman, MD    Physical Exam: Vitals:   07/27/23 1745 07/27/23 1800 07/27/23 1848 07/27/23 1849  BP: 100/70 105/73    Pulse: (!) 110 84    Resp: 20 15    Temp:   98.2 F (36.8 C)   TempSrc:   Oral   SpO2: (!) 83% 92%  91%   General: Patient  is alert and awake, appears to be in no distress.  Gives a coherent account of things that he wants to talk about.  Patient is maintaining sats over 90% on room air Respiratory exam: Bilateral intravesicular Cardiovascular exam S1-S2 normal No jugular venous distention abdomen is soft nontender Extremities cold/cool extremities bilateral lower extremity up to at least the mid shin area.  Edema 1+ up to the pelvic brim. There is a weeping ulceration of the left anterior shin.  As well as marked blister formation of the left dorsum of the foot.  See photodocumentation.   Media Information  Document Information     Data Reviewed:  Labs on Admission:  Results for orders placed or performed during the hospital encounter of 07/27/23 (from the past 24 hours)  Comprehensive metabolic panel     Status: Abnormal   Collection Time: 07/27/23  1:15 PM  Result Value Ref Range   Sodium 140 135 - 145 mmol/L   Potassium 3.5 3.5 - 5.1 mmol/L   Chloride 100 98 - 111 mmol/L   CO2 26 22 - 32 mmol/L   Glucose, Bld 89 70 - 99 mg/dL   BUN 16 8 - 23 mg/dL   Creatinine, Ser 1.61 (H) 0.61 - 1.24 mg/dL   Calcium 8.5 (L) 8.9 - 10.3 mg/dL   Total Protein 6.6 6.5 - 8.1 g/dL   Albumin 3.1 (L) 3.5 - 5.0 g/dL   AST 20 15 - 41 U/L   ALT 11 0 - 44 U/L   Alkaline Phosphatase 61 38 - 126 U/L   Total Bilirubin 0.6 0.0 - 1.2 mg/dL   GFR, Estimated 54 (L) >60 mL/min   Anion gap 14 5 - 15  CBC     Status: Abnormal   Collection Time: 07/27/23  1:15 PM  Result Value Ref Range   WBC 5.8 4.0 - 10.5 K/uL   RBC 4.14 (L) 4.22 - 5.81 MIL/uL   Hemoglobin 11.5 (L) 13.0 - 17.0 g/dL   HCT 09.6 (L) 04.5 - 40.9 %   MCV 92.8 80.0 - 100.0 fL   MCH 27.8 26.0 - 34.0 pg   MCHC 29.9 (L) 30.0 - 36.0 g/dL   RDW 81.1 (H) 91.4 - 78.2 %   Platelets 174 150 - 400 K/uL   nRBC 0.0 0.0 - 0.2 %  Brain natriuretic peptide     Status: Abnormal   Collection Time: 07/27/23  1:15 PM  Result Value Ref Range   B Natriuretic Peptide 1,769.5  (H) 0.0 - 100.0 pg/mL  CBC     Status: Abnormal   Collection Time: 07/27/23  7:51 PM  Result Value Ref Range   WBC 4.9 4.0 - 10.5 K/uL   RBC 4.09 (L) 4.22 - 5.81 MIL/uL   Hemoglobin 11.4 (L) 13.0 - 17.0 g/dL   HCT 95.6 (L) 21.3 - 08.6 %   MCV 92.2 80.0 - 100.0 fL   MCH 27.9 26.0 - 34.0 pg   MCHC 30.2 30.0 - 36.0 g/dL   RDW 57.8 (H) 46.9 - 62.9 %   Platelets 176 150 - 400 K/uL   nRBC 0.0 0.0 - 0.2 %  Creatinine, serum     Status: Abnormal   Collection Time: 07/27/23  7:51 PM  Result Value Ref Range   Creatinine, Ser 1.45 (H) 0.61 - 1.24 mg/dL   GFR, Estimated 51 (L) >60 mL/min  Troponin I (High Sensitivity)     Status: Abnormal   Collection Time: 07/27/23  7:51 PM  Result Value Ref Range   Troponin I (High Sensitivity) 41 (H) <18 ng/L   Basic Metabolic Panel: Recent Labs  Lab 07/27/23 1315 07/27/23 1951  NA 140  --   K 3.5  --   CL 100  --   CO2 26  --   GLUCOSE 89  --   BUN 16  --   CREATININE 1.39* 1.45*  CALCIUM 8.5*  --    Liver Function Tests: Recent Labs  Lab 07/27/23 1315  AST 20  ALT 11  ALKPHOS 61  BILITOT 0.6  PROT 6.6  ALBUMIN 3.1*   No results for input(s): "LIPASE", "AMYLASE" in the last 168  hours. No results for input(s): "AMMONIA" in the last 168 hours. CBC: Recent Labs  Lab 07/27/23 1315 07/27/23 1951  WBC 5.8 4.9  HGB 11.5* 11.4*  HCT 38.4* 37.7*  MCV 92.8 92.2  PLT 174 176   Cardiac Enzymes: Recent Labs  Lab 07/27/23 1951  TROPONINIHS 41*    BNP (last 3 results) No results for input(s): "PROBNP" in the last 8760 hours. CBG: No results for input(s): "GLUCAP" in the last 168 hours.  Radiological Exams on Admission:  DG Chest 1 View Result Date: 07/27/2023 CLINICAL DATA:  Shortness of breath. EXAM: CHEST  1 VIEW COMPARISON:  10/17/2022. FINDINGS: Low lung volume. There is mild-to-moderate diffuse pulmonary vascular congestion and small left and moderate right pleural effusion. No pneumothorax. Mildly enlarged  cardio-mediastinal silhouette. No acute osseous abnormalities. The soft tissues are within normal limits. IMPRESSION: Findings favor congestive heart failure/pulmonary edema, as described above. Electronically Signed   By: Jules Schick M.D.   On: 07/27/2023 15:45    chest X-ray  EKG: Independently reviewed. afib     Assessment and Plan: * Anasarca Patient has had chronic lower extremity edema.  However noted to have worsening edema today complicated by weeping as well as right-sided pleural effusion on chest x-ray.  No fever no white count.  Troponin is not actionable at this time.  Will trend the value.  Urine protein testing pending.  No marked LFT abnormality thus far.  Pending INR and PTT in the morning.  Recent echo on file from November 2024 with ejection fraction 70 to 75%.  Left ventricular hypertrophy of basal and septal segments.  Patient does seem to have chronic severely elevated pulmonary artery systolic pressure of 77.4 mmHg as well as left and right atrial dilatation.  My suspicion is that the patient's fluid overload has been precipitated by nonadherence to prescribed diuretic regimen.  Patient s/p 60 mg of Lasix in the ER.  However given the degree of patient's fluid overload, I believe patient needs to be started on 10 mg/h of Lasix infusion.  Will monitor clinical response, monitor magnesium level.  Pulmonary artery hypertension (HCC) Diagnosis based on echo November 2024 - this will need further work up routinely. Currently needs to be diuresed first.  AKI (acute kidney injury) (HCC) Baseline creatinine around 1, current creatinine 1.39.  Patient appears to be fluid overloaded on account of lower extremity edema.  Will check bladder scan urinalysis sodium creatinine intake output and daily weights.  Nicotine abuse Nicotine replacement therapy ordered.  Advised to quit.  PAD (peripheral artery disease) (HCC) This is chronic, with remote history of right-sided bypass done.   Patient noted to have cool extremities today.  But no resting pain is reported no acute skin changes except for blister formation.  Patient had feeble pulses on the dorsalis pedis on the left side.  See vascular surgery evaluation in the chart.  Appreciate consult.  Patient is pending arterial duplex.  Given lower extremity swelling I will also perform venous duplex at the same time.  Atrial fibrillation (HCC) This is chronic, due to remote GI bleed, patient has been kept off of anticoagulation.   Med rec done as folows: Continue with aspirin Plavix and statin for peripheral arterial disease Continue with Marcelline Deist for CHF with pEF Continue with Ensure 1 can daily Continue with Creon for chronic pancreatic insufficiency that is followed up at [facility] Continue with Synthroid 25 mcg daily for chronic hypothyroidism Continue with Protonix 40 twice daily for remote GI bleed.  Continue with vitamin D3 1000 units daily  DVT pp xordered with lovenox.   Advance Care Planning:   Code Status: Full Code   Consults: vascular.   Family Communication: per patient.  Severity of Illness: The appropriate patient status for this patient is INPATIENT. Inpatient status is judged to be reasonable and necessary in order to provide the required intensity of service to ensure the patient's safety. The patient's presenting symptoms, physical exam findings, and initial radiographic and laboratory data in the context of their chronic comorbidities is felt to place them at high risk for further clinical deterioration. Furthermore, it is not anticipated that the patient will be medically stable for discharge from the hospital within 2 midnights of admission.   * I certify that at the point of admission it is my clinical judgment that the patient will require inpatient hospital care spanning beyond 2 midnights from the point of admission due to high intensity of service, high risk for further deterioration and high frequency  of surveillance required.*  Author: Nolberto Hanlon, MD 07/27/2023 8:35 PM  For on call review [email or web address].

## 2023-07-27 NOTE — ED Provider Notes (Cosign Needed)
[place] EMERGENCY DEPARTMENT AT [facility] Provider Note   CSN: [id number] Arrival date & time: 07/27/23  1148     History  Chief Complaint  Patient presents with   Foot Pain   Shortness of Breath    Todd [name] is a 73 y.o. male with medical history of alcoholism, atrial fibrillation on anticoagulation, chronic back pain, hypertension, heart murmur, hypothyroidism, peripheral vascular disease.  The patient presents to the ED for evaluation of leg swelling, leg pain.  Reports that over the last 1 week he has had bilateral leg swelling left worse than right.  States that he has been taking 20 mg of Lasix for the last 1 week, has been told in the past he should take 60 mg a day but has not been doing this.  He endorses shortness of breath but reports he is always short of breath, denies any changes to his shortness of breath today.  Denies chest pain, lightheadedness, dizziness but is endorsing weakness.  Denies fevers, nausea, vomiting, abdominal pain.  Reports he last saw a vascular doctor on January 23.   Foot Pain Associated symptoms include shortness of breath. Pertinent negatives include no chest pain.  Shortness of Breath Associated symptoms: no chest pain and no fever        Home Medications Prior to Admission medications   Medication Sig Start Date End Date Taking? Authorizing Provider  aspirin EC 81 MG EC tablet Take 1 tablet (81 mg total) by mouth daily at 6 (six) AM. Swallow whole. 03/17/21  Yes Setzer, Lynnell Jude, PA-C  cholecalciferol (VITAMIN D3) 25 MCG (1000 UNIT) tablet Take 1,000 Units by mouth daily.   Yes [provider]  clopidogrel (PLAVIX) 75 MG tablet TAKE ONE TABLET BY MOUTH ONCE DAILY. 05/05/23  Yes Jonelle Sidle, MD  diclofenac Sodium (VOLTAREN) 1 % GEL Apply 4 g topically 4 (four) times daily as needed (pain). 01/31/23  Yes [provider]  Ensure (ENSURE) Take 1 Can by mouth daily.   Yes [provider]   FARXIGA 10 MG TABS tablet Take 1 tablet (10 mg total) by mouth daily before breakfast. 05/30/23  Yes Romie Minus, MD  furosemide (LASIX) 20 MG tablet Take 1 tablet (20 mg total) by mouth daily. 06/12/23  Yes Romie Minus, MD  levothyroxine (SYNTHROID) 25 MCG tablet Take 25 mcg by mouth daily before breakfast.   Yes [provider]  lipase/protease/amylase (CREON) 36000 UNITS CPEP capsule Take 2 capsules (72,000 Units total) by mouth 3 (three) times daily before meals. And 1 capsule with snacks 03/15/23 03/14/24 Yes Carver, Keaundre K, DO  pantoprazole (PROTONIX) 40 MG tablet Take 1 tablet (40 mg total) by mouth 2 (two) times daily. 11/03/22 11/03/23 Yes Carver, Delores K, DO  rosuvastatin (CRESTOR) 10 MG tablet TAKE (1) TABLET BY MOUTH ONCE DAILY. 09/09/20  Yes Maeola Harman, MD      Allergies    Patient has no known allergies.    Review of Systems   Review of Systems  Constitutional:  Negative for fever.  Respiratory:  Positive for shortness of breath.   Cardiovascular:  Positive for leg swelling. Negative for chest pain.  Neurological:  Positive for weakness. Negative for dizziness and light-headedness.  All other systems reviewed and are negative.   Physical Exam Updated Vital Signs BP 103/81   Pulse 83   Temp (!) 97.5 F (36.4 C) (Oral)   Resp (!) 23   SpO2 100%  Physical Exam  Vitals and nursing note reviewed.  Constitutional:      General: He is not in acute distress.    Appearance: Normal appearance. He is not ill-appearing, toxic-appearing or diaphoretic.  HENT:     Head: Normocephalic and atraumatic.     Nose: Nose normal.     Mouth/Throat:     Mouth: Mucous membranes are moist.     Pharynx: Oropharynx is clear.  Eyes:     Extraocular Movements: Extraocular movements intact.     Conjunctiva/sclera: Conjunctivae normal.     Pupils: Pupils are equal, round, and reactive to light.  Cardiovascular:     Rate and Rhythm: Normal rate and regular  rhythm.     Pulses:          Posterior tibial pulses are detected w/ Doppler on the left side.  Pulmonary:     Effort: Pulmonary effort is normal.     Breath sounds: Normal breath sounds.  Abdominal:     General: Abdomen is flat. Bowel sounds are normal.     Palpations: Abdomen is soft.     Tenderness: There is no abdominal tenderness.  Musculoskeletal:     Cervical back: Normal range of motion and neck supple. No tenderness.  Skin:    General: Skin is warm and dry.     Capillary Refill: Capillary refill takes less than 2 seconds.  Neurological:     Mental Status: He is alert and oriented to person, place, and time.     ED Results / Procedures / Treatments   Labs (all labs ordered are listed, but only abnormal results are displayed) Labs Reviewed  COMPREHENSIVE METABOLIC PANEL - Abnormal; Notable for the following components:      Result Value   Creatinine, Ser 1.39 (*)    Calcium 8.5 (*)    Albumin 3.1 (*)    GFR, Estimated 54 (*)    All other components within normal limits  CBC - Abnormal; Notable for the following components:   RBC 4.14 (*)    Hemoglobin 11.5 (*)    HCT 38.4 (*)    MCHC 29.9 (*)    RDW 18.9 (*)    All other components within normal limits  BRAIN NATRIURETIC PEPTIDE - Abnormal; Notable for the following components:   B Natriuretic Peptide 1,769.5 (*)    All other components within normal limits  CBC - Abnormal; Notable for the following components:   RBC 4.09 (*)    Hemoglobin 11.4 (*)    HCT 37.7 (*)    RDW 18.9 (*)    All other components within normal limits  CREATININE, SERUM - Abnormal; Notable for the following components:   Creatinine, Ser 1.45 (*)    GFR, Estimated 51 (*)    All other components within normal limits  TROPONIN I (HIGH SENSITIVITY) - Abnormal; Notable for the following components:   Troponin I (High Sensitivity) 41 (*)    All other components within normal limits  APTT  PROTIME-INR  BASIC METABOLIC PANEL  CBC   CREATININE, URINE, RANDOM  SODIUM, URINE, RANDOM  URINALYSIS, COMPLETE (UACMP) WITH MICROSCOPIC  MAGNESIUM    EKG EKG Interpretation Date/Time:  Thursday July 27 2023 13:36:13 EST Ventricular Rate:  88 PR Interval:    QRS Duration:  90 QT Interval:  236 QTC Calculation: 285 R Axis:   138  Text Interpretation: Atrial fibrillation Right ventricular hypertrophy Septal infarct , age undetermined Lateral infarct , age undetermined Abnormal ECG When compared with ECG of 12-Jun-2023 09:07,  PREVIOUS ECG IS PRESENT Confirmed by Richardean Canal ([id number]) on 07/27/2023 4:52:01 PM  Radiology DG Chest 1 View Result Date: 07/27/2023 CLINICAL DATA:  Shortness of breath. EXAM: CHEST  1 VIEW COMPARISON:  10/17/2022. FINDINGS: Low lung volume. There is mild-to-moderate diffuse pulmonary vascular congestion and small left and moderate right pleural effusion. No pneumothorax. Mildly enlarged cardio-mediastinal silhouette. No acute osseous abnormalities. The soft tissues are within normal limits. IMPRESSION: Findings favor congestive heart failure/pulmonary edema, as described above. Electronically Signed   By: Jules Schick M.D.   On: 07/27/2023 15:45    Procedures Procedures    Medications Ordered in ED Medications  enoxaparin (LOVENOX) injection 40 mg (has no administration in time range)  acetaminophen (TYLENOL) tablet 650 mg (has no administration in time range)    Or  acetaminophen (TYLENOL) suppository 650 mg (has no administration in time range)  polyethylene glycol (MIRALAX / GLYCOLAX) packet 17 g (has no administration in time range)  sodium chloride flush (NS) 0.9 % injection 3 mL (has no administration in time range)  nicotine polacrilex (NICORETTE) gum 2 mg (has no administration in time range)  furosemide (LASIX) 200 mg in dextrose 5 % 100 mL (2 mg/mL) infusion (has no administration in time range)  aspirin EC tablet 81 mg (has no administration in time range)  dapagliflozin  propanediol (FARXIGA) tablet 10 mg (has no administration in time range)  rosuvastatin (CRESTOR) tablet 10 mg (has no administration in time range)  lipase/protease/amylase (CREON) capsule 72,000 Units (has no administration in time range)  Ensure liquid 1 Can (has no administration in time range)  clopidogrel (PLAVIX) tablet 75 mg (has no administration in time range)  levothyroxine (SYNTHROID) tablet 25 mcg (has no administration in time range)  pantoprazole (PROTONIX) EC tablet 40 mg (has no administration in time range)  cholecalciferol (VITAMIN D3) 25 MCG (1000 UNIT) tablet 1,000 Units (has no administration in time range)  furosemide (LASIX) injection 60 mg (60 mg Intravenous Given 07/27/23 1840)  potassium chloride SA (KLOR-CON M) CR tablet 40 mEq (40 mEq Oral Given 07/27/23 1841)    ED Course/ Medical Decision Making/ A&P Clinical Course as of 07/27/23 2225  Thu Jul 27, 2023  1703 Faint PT on doppler, unable to find DP on doppler. Cold foot. [CG]  1757 Consulted with Dr. Chestine Spore who stated they will follow along on patient while admitted, no need for acute CT bifemoral with run off. Has PT pulse. Diurese and admit for volume overload [CG]  1825 CHF exacerbation, AKI [CG]    Clinical Course User Index [CG] Al Decant, PA-C    Medical Decision Making Risk Prescription drug management. Decision regarding hospitalization.   73 year old male presents for evaluation.  Please see HPI for further details.  On exam patient is afebrile, nontachycardic.  Lung sounds clear bilaterally, not hypoxic.  Abdomen soft and compressible.  Neurological examinations at baseline.  Patient has 2+ pitting edema in bilateral lower extremities.  Has large fluid-filled blister on the dorsum of his left foot.  He has very faint posterior tibialis pulse appreciated with pencil Doppler on exam.  Have consulted Dr. Chestine Spore of vascular surgery for this.  Will collect CBC, CMP, BNP, troponin.  Will also  collect chest x-ray.  Patient CBC without leukocytosis or anemia.  Metabolic panel shows increased creatinine 1.39.  1 month ago patient creatinine 1.04.  Patient troponin 41.  Patient BNP 1769.  Diuresed with 60 mg of Lasix and provided with 40 mEq oral potassium.  Chest x-ray shows pulmonary edema.  Discussed patient diminished PT pulse with Dr. Chestine Spore.  Dr. Chestine Spore states that they will follow along with the patient while he is admitted for diuresis.  No indications for heparin at this time or CT bifemoral runoff.  Discussed patient with Dr. Maryjean Ka of [facility] who has agreed to admit the patient for further management.   Final Clinical Impression(s) / ED Diagnoses Final diagnoses:  Acute on chronic congestive heart failure, unspecified heart failure type (HCC)  AKI (acute kidney injury) [facility])    Rx / DC Orders ED Discharge Orders     None         Al Decant, PA-C 07/27/23 2226

## 2023-07-28 ENCOUNTER — Inpatient Hospital Stay (HOSPITAL_COMMUNITY): Payer: PPO

## 2023-07-28 DIAGNOSIS — N179 Acute kidney failure, unspecified: Secondary | ICD-10-CM | POA: Diagnosis not present

## 2023-07-28 DIAGNOSIS — M7989 Other specified soft tissue disorders: Secondary | ICD-10-CM | POA: Diagnosis not present

## 2023-07-28 DIAGNOSIS — I7090 Unspecified atherosclerosis: Secondary | ICD-10-CM

## 2023-07-28 DIAGNOSIS — I739 Peripheral vascular disease, unspecified: Secondary | ICD-10-CM | POA: Diagnosis not present

## 2023-07-28 DIAGNOSIS — R601 Generalized edema: Secondary | ICD-10-CM | POA: Diagnosis not present

## 2023-07-28 DIAGNOSIS — I2721 Secondary pulmonary arterial hypertension: Secondary | ICD-10-CM

## 2023-07-28 DIAGNOSIS — I4821 Permanent atrial fibrillation: Secondary | ICD-10-CM

## 2023-07-28 LAB — URINALYSIS, COMPLETE (UACMP) WITH MICROSCOPIC
Bacteria, UA: NONE SEEN
Bilirubin Urine: NEGATIVE
Glucose, UA: 150 mg/dL — AB
Hgb urine dipstick: NEGATIVE
Ketones, ur: NEGATIVE mg/dL
Leukocytes,Ua: NEGATIVE
Nitrite: NEGATIVE
Protein, ur: NEGATIVE mg/dL
Specific Gravity, Urine: 1.006 (ref 1.005–1.030)
pH: 5 (ref 5.0–8.0)

## 2023-07-28 LAB — SODIUM, URINE, RANDOM: Sodium, Ur: 114 mmol/L

## 2023-07-28 LAB — CBC
HCT: 37.5 % — ABNORMAL LOW (ref 39.0–52.0)
Hemoglobin: 11.3 g/dL — ABNORMAL LOW (ref 13.0–17.0)
MCH: 28 pg (ref 26.0–34.0)
MCHC: 30.1 g/dL (ref 30.0–36.0)
MCV: 92.8 fL (ref 80.0–100.0)
Platelets: 160 10*3/uL (ref 150–400)
RBC: 4.04 MIL/uL — ABNORMAL LOW (ref 4.22–5.81)
RDW: 18.9 % — ABNORMAL HIGH (ref 11.5–15.5)
WBC: 4.6 10*3/uL (ref 4.0–10.5)
nRBC: 0 % (ref 0.0–0.2)

## 2023-07-28 LAB — CREATININE, URINE, RANDOM: Creatinine, Urine: 14 mg/dL

## 2023-07-28 LAB — BASIC METABOLIC PANEL
Anion gap: 15 (ref 5–15)
BUN: 17 mg/dL (ref 8–23)
CO2: 23 mmol/L (ref 22–32)
Calcium: 8 mg/dL — ABNORMAL LOW (ref 8.9–10.3)
Chloride: 101 mmol/L (ref 98–111)
Creatinine, Ser: 1.37 mg/dL — ABNORMAL HIGH (ref 0.61–1.24)
GFR, Estimated: 54 mL/min — ABNORMAL LOW (ref >60)
Glucose, Bld: 87 mg/dL (ref 70–99)
Potassium: 3.8 mmol/L (ref 3.5–5.1)
Sodium: 139 mmol/L (ref 135–145)

## 2023-07-28 LAB — TROPONIN I (HIGH SENSITIVITY): Troponin I (High Sensitivity): 37 ng/L — ABNORMAL HIGH (ref <18)

## 2023-07-28 LAB — APTT: aPTT: 41 s — ABNORMAL HIGH (ref 24–36)

## 2023-07-28 LAB — PROTIME-INR
INR: 1.3 — ABNORMAL HIGH (ref 0.8–1.2)
Prothrombin Time: 16.3 s — ABNORMAL HIGH (ref 11.4–15.2)

## 2023-07-28 LAB — MAGNESIUM: Magnesium: 2.1 mg/dL (ref 1.7–2.4)

## 2023-07-28 MED ORDER — FUROSEMIDE 10 MG/ML IJ SOLN
40.0000 mg | Freq: Two times a day (BID) | INTRAMUSCULAR | Status: DC
  Administered 2023-07-28: 40 mg via INTRAVENOUS
  Filled 2023-07-28: qty 4

## 2023-07-28 NOTE — Consult Note (Addendum)
WOC Nurse Consult Note: Reason for Consult: requested to assess multiple wound on the lower extremities and bullae on feet. Pt has anasarca, cardiac condition, liver disease with history alcohol abuse.  Wound type: Full thickness and 2 bullae on the surface, base of the toes. Measurement: Right foot - blister 4cmx3cm. Wound on the second toe 1x1cm - dry with scab.  Left foot - Blister 10cmx7cm Anterior part of the ankle - 6x5cm (multiple wounds) Wound bed: 100% red/pink, dermis exposition Left great toe - lateral wound 0.5cmx0.5cm Bellow the nail 0.5cmx0.5cm Both dry with scab.  Periwound: both legs with edema 3+/4+  Drainage (amount, consistency, odor)  Serous amount on the blisters and ankle. No drain on second toe or great toe.  Dressing procedure/placement/frequency: Apply Xeroform on the wound bed (both legs and feet, including the site when the blisters was drainage. Cover with gauze and wrap with Kerlix, since the toes to the ankle, covering the wounds, apply without tension. Change daily. Conservative sharp wound debridement: Drainage both blisters maintaining the skin intact.  WOC team will not plan to follow further.  Please reconsult if further assistance is needed. Thank-you,  Denyse Amass BSN, RN, [employer], WOC  (Pager: [phone])

## 2023-07-28 NOTE — Progress Notes (Signed)
Lower extremity arterial and venous studies completed.

## 2023-07-28 NOTE — Progress Notes (Signed)
PROGRESS NOTE    Todd [name]  ZOX:[id number] DOB: Jun 21, 1951 DOA: 07/27/2023 PCP: Elfredia Nevins, MD   Brief Narrative: Todd [name] is a 73 y.o. male with a history of hypertension, hyperlipidemia, alcohol abuse, tobacco use, PAD, PAD.  Patient presented secondary to foot pain and shortness of breath and was found to have evidence of anasarca. Lasix IV infusion started.   Assessment/Plan:  Anasarca Unclear etiology. He has a history of congestive heart failure, however last Transthoracic Echocardiogram showed a preserved EF without mention of diastolic dysfunction. Albumin is mildly low at 3.1 on admission. Patient also has a history of alcohol abuse, which may be contributory to liver disease; INR elevated. -Continue Lasix IV infusion -Strict in/out -Daily weights -Daily BMP while on Lasix -Check hepatic function panel and RUQ ultrasound   Pulmonary artery hypertension Noted.  AKI Baseline creatinine of about 1.00. Creatinine of 1.39 on admission with peak up to 1.45. Improved slightly with Lasix. -BMP in AM  PAD -Continue aspirin and plavix -Continue Crestor  Permanent atrial fibrillation Previously documented. Prior attempts at cardioversion have failed. Patient is not on rate or rhythm control medication. He is not on anticoagulation.  Chronic pancreatitis -Continue Creon  Hypothyroidism -Continue Synthroid  GERD -Continue Protonix  Nicotine use Noted. Nicotine patch ordered on admission.   DVT prophylaxis: Lovenox Code Status:   Code Status: Full Code Family Communication: None at bedside Disposition Plan: Discharge home pending transition off of IV lasix, in addition to ongoing vascular surgery recommendations   Consultants:  Vascular surgery  Procedures:  None  Antimicrobials: None    Subjective: Patient reports dyspnea with bending forward and sideways. Dyspnea on exertion prior to admission, however he has not been up to  move around.  Objective: BP 106/75   Pulse 82   Temp 97.7 F (36.5 C) (Oral)   Resp (!) 21   SpO2 100%   Examination:  General exam: Appears calm and comfortable Respiratory system: Clear to auscultation. Respiratory effort normal. Cardiovascular system: S1 & S2 heard, RRR. No murmurs, rubs, gallops or clicks. Gastrointestinal system: Abdomen is is slightly edematous, soft and nontender. Normal bowel sounds heard. Central nervous system: Alert and oriented. No focal neurological deficits. Musculoskeletal: BLE 2+ pitting edema up to hips. Right foot is cool. Skin: left foot with large bullae located on dorsal aspect. Multiple chronic wounds noted. Small bullae located on right foot. Psychiatry: Judgement and insight appear normal. Mood & affect appropriate.    Data Reviewed: I have personally reviewed following labs and imaging studies   Last CBC Lab Results  Component Value Date   WBC 4.6 07/28/2023   HGB 11.3 (L) 07/28/2023   HCT 37.5 (L) 07/28/2023   MCV 92.8 07/28/2023   MCH 28.0 07/28/2023   RDW 18.9 (H) 07/28/2023   PLT 160 07/28/2023     Last metabolic panel Lab Results  Component Value Date   GLUCOSE 87 07/28/2023   NA 139 07/28/2023   K 3.8 07/28/2023   CL 101 07/28/2023   CO2 23 07/28/2023   BUN 17 07/28/2023   CREATININE 1.37 (H) 07/28/2023   GFRNONAA 54 (L) 07/28/2023   CALCIUM 8.0 (L) 07/28/2023   PHOS 2.2 (L) 10/18/2022   PROT 6.6 07/27/2023   ALBUMIN 3.1 (L) 07/27/2023   BILITOT 0.6 07/27/2023   ALKPHOS 61 07/27/2023   AST 20 07/27/2023   ALT 11 07/27/2023   ANIONGAP 15 07/28/2023     Creatinine Clearance: Estimated Creatinine Clearance: 45.9 mL/min (A) (  by C-G formula based on SCr of 1.37 mg/dL (H)).  No results found for this or any previous visit (from the past 240 hours).    Radiology Studies: DG Chest 1 View Result Date: 07/27/2023 CLINICAL DATA:  Shortness of breath. EXAM: CHEST  1 VIEW COMPARISON:  10/17/2022. FINDINGS: Low lung  volume. There is mild-to-moderate diffuse pulmonary vascular congestion and small left and moderate right pleural effusion. No pneumothorax. Mildly enlarged cardio-mediastinal silhouette. No acute osseous abnormalities. The soft tissues are within normal limits. IMPRESSION: Findings favor congestive heart failure/pulmonary edema, as described above. Electronically Signed   By: Jules Schick M.D.   On: 07/27/2023 15:45      LOS: 1 day    Jacquelin Hawking, MD [facility] 07/28/2023, 9:42 AM   If 7PM-7AM, please contact night-coverage [email or web address]

## 2023-07-28 NOTE — Progress Notes (Signed)
I will tentatively post for left lower extremity angiogram in the Cath Lab on Monday for some chronic toe ulcers.  Left leg arterial duplex ordered today.  Cath Lab next week pending improvement in his volume status and shortness of breath and ability to lay flat.  Vascular will follow through the weekend.  Cephus Shelling, MD [facility] Office: [phone]   Cephus Shelling

## 2023-07-29 DIAGNOSIS — N179 Acute kidney failure, unspecified: Secondary | ICD-10-CM | POA: Diagnosis not present

## 2023-07-29 DIAGNOSIS — I739 Peripheral vascular disease, unspecified: Secondary | ICD-10-CM | POA: Diagnosis not present

## 2023-07-29 DIAGNOSIS — I4821 Permanent atrial fibrillation: Secondary | ICD-10-CM | POA: Diagnosis not present

## 2023-07-29 DIAGNOSIS — R601 Generalized edema: Secondary | ICD-10-CM | POA: Diagnosis not present

## 2023-07-29 LAB — BASIC METABOLIC PANEL
Anion gap: 15 (ref 5–15)
BUN: 20 mg/dL (ref 8–23)
CO2: 26 mmol/L (ref 22–32)
Calcium: 8.2 mg/dL — ABNORMAL LOW (ref 8.9–10.3)
Chloride: 98 mmol/L (ref 98–111)
Creatinine, Ser: 1.88 mg/dL — ABNORMAL HIGH (ref 0.61–1.24)
GFR, Estimated: 37 mL/min — ABNORMAL LOW (ref >60)
Glucose, Bld: 91 mg/dL (ref 70–99)
Potassium: 3.3 mmol/L — ABNORMAL LOW (ref 3.5–5.1)
Sodium: 139 mmol/L (ref 135–145)

## 2023-07-29 LAB — HEPATIC FUNCTION PANEL
ALT: 9 U/L (ref 0–44)
AST: 19 U/L (ref 15–41)
Albumin: 2.9 g/dL — ABNORMAL LOW (ref 3.5–5.0)
Alkaline Phosphatase: 59 U/L (ref 38–126)
Bilirubin, Direct: 0.2 mg/dL (ref 0.0–0.2)
Indirect Bilirubin: 0.4 mg/dL (ref 0.3–0.9)
Total Bilirubin: 0.6 mg/dL (ref 0.0–1.2)
Total Protein: 6.4 g/dL — ABNORMAL LOW (ref 6.5–8.1)

## 2023-07-29 MED ORDER — FUROSEMIDE 10 MG/ML IJ SOLN
80.0000 mg | Freq: Two times a day (BID) | INTRAMUSCULAR | Status: DC
  Administered 2023-07-29 (×2): 80 mg via INTRAVENOUS
  Filled 2023-07-29 (×2): qty 8

## 2023-07-29 NOTE — Progress Notes (Signed)
PROGRESS NOTE    Todd [name]  UJW:[id number] DOB: 1950-07-14 DOA: 07/27/2023 PCP: Elfredia Nevins, MD   Brief Narrative: Todd [name] is a 73 y.o. male with a history of hypertension, hyperlipidemia, alcohol abuse, tobacco use, PAD, PAD.  Patient presented secondary to foot pain and shortness of breath and was found to have evidence of anasarca. Lasix IV infusion started, transitioned to Lasix IV bolus.   Assessment/Plan:  Anasarca Unclear etiology. He has a history of congestive heart failure, however last Transthoracic Echocardiogram showed a preserved EF without mention of diastolic dysfunction. Albumin is mildly low at 3.1 on admission. Patient also has a history of alcohol abuse, which may be contributory to liver disease; INR elevated. RUQ ultrasound confirms likely cirrhosis. Lasix IV infusion transitioned to Lasix IV bolus. Documented urine output of only 675 mL over the last 24 hours. -Increase to Lasix IV 80 mg BID -Strict in/out -Daily weights -Daily BMP while on Lasix   Pulmonary artery hypertension Noted.  AKI Baseline creatinine of about 1.00. Creatinine of 1.39 on admission with peak up to 1.45. Improved slightly with Lasix. -BMP pending  PAD Cool right extremity with history of right femoral to popliteal artery bypass grafting. Left foot with ulcers present. Vascular surgery consulted. Left arterial duplex scan significant for 75-99% stenosis of the superficial femoral artery. -Continue aspirin and plavix -Continue Crestor -Vascular surgery recommendations: plan for left lower extremity angiogram on 2/3  Permanent atrial fibrillation Previously documented. Prior attempts at cardioversion have failed. Patient is not on rate or rhythm control medication. He is not on anticoagulation.  Chronic pancreatitis -Continue Creon  Hypothyroidism -Continue Synthroid  GERD -Continue Protonix  Nicotine use Noted. Nicotine patch ordered on  admission.   DVT prophylaxis: Lovenox Code Status:   Code Status: Full Code Family Communication: None at bedside Disposition Plan: Discharge home pending transition off of IV lasix, in addition to ongoing vascular surgery recommendations   Consultants:  Vascular surgery  Procedures:  Left lower extremity arterial duplex Lower extremity venous duplex  Antimicrobials: None   Subjective: No specific concerns. Breathing stable.  Objective: BP 102/72 (BP Location: Right Arm)   Pulse 81   Temp (!) 97.5 F (36.4 C)   Resp 17   SpO2 94%   Examination:  General exam: Appears calm and comfortable Respiratory system: Diminished. Respiratory effort normal. Cardiovascular system: S1 & S2 heard, RRR.  Gastrointestinal system: Abdomen is nondistended, soft and nontender. Normal bowel sounds heard. Central nervous system: Alert and oriented. No focal neurological deficits. Musculoskeletal: BLE 2+ edema. No calf tenderness Psychiatry: Judgement and insight appear normal. Mood & affect appropriate.    Data Reviewed: I have personally reviewed following labs and imaging studies   Last CBC Lab Results  Component Value Date   WBC 4.6 07/28/2023   HGB 11.3 (L) 07/28/2023   HCT 37.5 (L) 07/28/2023   MCV 92.8 07/28/2023   MCH 28.0 07/28/2023   RDW 18.9 (H) 07/28/2023   PLT 160 07/28/2023     Last metabolic panel Lab Results  Component Value Date   GLUCOSE 87 07/28/2023   NA 139 07/28/2023   K 3.8 07/28/2023   CL 101 07/28/2023   CO2 23 07/28/2023   BUN 17 07/28/2023   CREATININE 1.37 (H) 07/28/2023   GFRNONAA 54 (L) 07/28/2023   CALCIUM 8.0 (L) 07/28/2023   PHOS 2.2 (L) 10/18/2022   PROT 6.6 07/27/2023   ALBUMIN 3.1 (L) 07/27/2023   BILITOT 0.6 07/27/2023   ALKPHOS 61  07/27/2023   AST 20 07/27/2023   ALT 11 07/27/2023   ANIONGAP 15 07/28/2023     Creatinine Clearance: Estimated Creatinine Clearance: 45.9 mL/min (A) (by C-G formula based on SCr of 1.37 mg/dL  (H)).  No results found for this or any previous visit (from the past 240 hours).    Radiology Studies: US Abdomen Limited RUQ (LIVER/GB) Result Date: 07/28/2023 CLINICAL DATA:  Anasarca and elevated INR EXAM: ULTRASOUND ABDOMEN LIMITED RIGHT UPPER QUADRANT COMPARISON:  MRI abdomen dated 05/05/2023 FINDINGS: Gallbladder: No gallstones or wall thickening visualized. No sonographic Murphy sign noted by sonographer. Common bile duct: Diameter: 6 mm Liver: Nodular hepatic contour. Portal vein is patent on color Doppler imaging with normal direction of blood flow towards the liver. Other: Small volume ascites. Partially imaged right pleural effusion. IMPRESSION: 1. Nodular hepatic contour, which can be seen in the setting of cirrhosis. 2. Small volume ascites. 3. Partially imaged right pleural effusion. Electronically Signed   By: Agustin Cree M.D.   On: 07/28/2023 14:06   VAS [place] LOWER EXTREMITY ARTERIAL DUPLEX Result Date: 07/28/2023 LOWER EXTREMITY ARTERIAL DUPLEX STUDY Patient Name:  Todd [name]  Date of Exam:   07/28/2023 Medical Rec #: [id number]           Accession #:    [id number] Date of Birth: [birth year]          Patient Gender: M Patient Age:   41 years Exam Location:  [facility] Procedure:      VAS [place] LOWER EXTREMITY ARTERIAL DUPLEX Referring Phys: [facility] GOEL --------------------------------------------------------------------------------  Indications: Peripheral artery disease, and Bilateral lower extremity              blistering, wound on the left first and second toe. High Risk Factors: Hypertension, current smoker. Other Factors: Atrial fibrillation, carotid artery disease, CHF with pulmonary                hypertension.  Vascular Interventions: 03/15/21 - Right EIA stent. 03/12/21 - Right femoral to                         above knee bypass. 03/02/21 - Left EIA stent. Current ABI:            Non compressible vessels on 07/20/23 Performing Technologist: Marilynne Halsted RDMS, RVT   Examination Guidelines: A complete evaluation includes B-mode imaging, spectral Doppler, color Doppler, and power Doppler as needed of all accessible portions of each vessel. Bilateral testing is considered an integral part of a complete examination. Limited examinations for reoccurring indications may be performed as noted.   +-----------+--------+-----+---------------+------------------+----------------+ LEFT       PSV cm/sRatioStenosis       Waveform          Comments         +-----------+--------+-----+---------------+------------------+----------------+ CFA Distal 112                         monophasic                         +-----------+--------+-----+---------------+------------------+----------------+ DFA        45                          biphasic                           +-----------+--------+-----+---------------+------------------+----------------+  SFA Prox   61                          monophasic                         +-----------+--------+-----+---------------+------------------+----------------+ SFA Mid    524     10.2775-99% stenosismonophasic        Proximal to mid                                                           segment          +-----------+--------+-----+---------------+------------------+----------------+ SFA Distal 35                          monophasic                         +-----------+--------+-----+---------------+------------------+----------------+ POP Prox   25                          monophasic                         +-----------+--------+-----+---------------+------------------+----------------+ POP Distal 36                          monophasic                         +-----------+--------+-----+---------------+------------------+----------------+ ATA Prox   35                          monophasic                         +-----------+--------+-----+---------------+------------------+----------------+  ATA Mid    13                          dampened                                                                  monophasic                         +-----------+--------+-----+---------------+------------------+----------------+ ATA Distal 9                           dampened                                                                  monophasic                         +-----------+--------+-----+---------------+------------------+----------------+  PTA Prox   11                          dampened                                                                  monophasic                         +-----------+--------+-----+---------------+------------------+----------------+ PTA Distal 22                          monophasic                         +-----------+--------+-----+---------------+------------------+----------------+ PERO Prox  19                          monophasic                         +-----------+--------+-----+---------------+------------------+----------------+ PERO Mid   15                          dampened                                                                  monophasic                         +-----------+--------+-----+---------------+------------------+----------------+ PERO Distal19                          dampened                                                                  monophasic                         +-----------+--------+-----+---------------+------------------+----------------+ DP         12                          dampened                                                                  monophasic                         +-----------+--------+-----+---------------+------------------+----------------+  Summary: Left: 75-99% stenosis noted in the superficial femoral artery.  See table(s) above for measurements and observations.    Preliminary    VAS [place] LOWER  EXTREMITY VENOUS (DVT) Result Date: 07/28/2023  Lower Venous DVT Study Patient Name:  Todd [name]  Date of Exam:   07/28/2023 Medical Rec #: [id number]           Accession #:    [id number] Date of Birth: Sep 04, 1950          Patient Gender: M Patient Age:   52 years Exam Location:  [facility] Procedure:      VAS [place] LOWER EXTREMITY VENOUS (DVT) Referring Phys: [facility] GOEL --------------------------------------------------------------------------------  Indications: Swelling, Edema, Pain, and Blisters on left leg and foot.  Risk Factors: Surgery Right femoral to above knee bypass graft. Performing Technologist: Marilynne Halsted RDMS, RVT  Examination Guidelines: A complete evaluation includes B-mode imaging, spectral Doppler, color Doppler, and power Doppler as needed of all accessible portions of each vessel. Bilateral testing is considered an integral part of a complete examination. Limited examinations for reoccurring indications may be performed as noted. The reflux portion of the exam is performed with the patient in reverse Trendelenburg.  +---------+---------------+---------+-----------+----------+--------------+ RIGHT    CompressibilityPhasicitySpontaneityPropertiesThrombus Aging +---------+---------------+---------+-----------+----------+--------------+ CFV      Full           Yes      Yes                                 +---------+---------------+---------+-----------+----------+--------------+ SFJ      Full                                                        +---------+---------------+---------+-----------+----------+--------------+ FV Prox  Full                                                        +---------+---------------+---------+-----------+----------+--------------+ FV Mid   Full                                                        +---------+---------------+---------+-----------+----------+--------------+ FV DistalFull                                                         +---------+---------------+---------+-----------+----------+--------------+ PFV      Full                                                        +---------+---------------+---------+-----------+----------+--------------+ POP      Full           Yes  Yes                                 +---------+---------------+---------+-----------+----------+--------------+ PTV      Full                                                        +---------+---------------+---------+-----------+----------+--------------+ PERO     Full                                                        +---------+---------------+---------+-----------+----------+--------------+ Patent with pulsatile flow  +---------+---------------+---------+-----------+----------+--------------+ LEFT     CompressibilityPhasicitySpontaneityPropertiesThrombus Aging +---------+---------------+---------+-----------+----------+--------------+ CFV      Full           Yes      Yes                                 +---------+---------------+---------+-----------+----------+--------------+ SFJ      Full                                                        +---------+---------------+---------+-----------+----------+--------------+ FV Prox  Full                                                        +---------+---------------+---------+-----------+----------+--------------+ FV Mid   Full                                                        +---------+---------------+---------+-----------+----------+--------------+ FV DistalFull                                                        +---------+---------------+---------+-----------+----------+--------------+ PFV      Full                                                        +---------+---------------+---------+-----------+----------+--------------+ POP      Full           Yes      Yes                                  +---------+---------------+---------+-----------+----------+--------------+ PTV      Full                                                        +---------+---------------+---------+-----------+----------+--------------+  PERO     Full                                                        +---------+---------------+---------+-----------+----------+--------------+ Patent with pulsatile flow.   Summary: BILATERAL: - No evidence of deep vein thrombosis seen in the lower extremities, bilaterally. -No evidence of popliteal cyst, bilaterally.   *See table(s) above for measurements and observations.    Preliminary    DG Chest 1 View Result Date: 07/27/2023 CLINICAL DATA:  Shortness of breath. EXAM: CHEST  1 VIEW COMPARISON:  10/17/2022. FINDINGS: Low lung volume. There is mild-to-moderate diffuse pulmonary vascular congestion and small left and moderate right pleural effusion. No pneumothorax. Mildly enlarged cardio-mediastinal silhouette. No acute osseous abnormalities. The soft tissues are within normal limits. IMPRESSION: Findings favor congestive heart failure/pulmonary edema, as described above. Electronically Signed   By: Jules Schick M.D.   On: 07/27/2023 15:45      LOS: 2 days    Jacquelin Hawking, MD [facility] 07/29/2023, 7:53 AM   If 7PM-7AM, please contact night-coverage [email or web address]

## 2023-07-29 NOTE — Plan of Care (Signed)

## 2023-07-30 DIAGNOSIS — I739 Peripheral vascular disease, unspecified: Secondary | ICD-10-CM | POA: Diagnosis not present

## 2023-07-30 DIAGNOSIS — I70245 Atherosclerosis of native arteries of left leg with ulceration of other part of foot: Secondary | ICD-10-CM | POA: Diagnosis not present

## 2023-07-30 DIAGNOSIS — R601 Generalized edema: Secondary | ICD-10-CM | POA: Diagnosis not present

## 2023-07-30 DIAGNOSIS — N179 Acute kidney failure, unspecified: Secondary | ICD-10-CM | POA: Diagnosis not present

## 2023-07-30 DIAGNOSIS — I4821 Permanent atrial fibrillation: Secondary | ICD-10-CM | POA: Diagnosis not present

## 2023-07-30 LAB — BASIC METABOLIC PANEL
Anion gap: 12 (ref 5–15)
BUN: 20 mg/dL (ref 8–23)
CO2: 31 mmol/L (ref 22–32)
Calcium: 8.1 mg/dL — ABNORMAL LOW (ref 8.9–10.3)
Chloride: 93 mmol/L — ABNORMAL LOW (ref 98–111)
Creatinine, Ser: 1.61 mg/dL — ABNORMAL HIGH (ref 0.61–1.24)
GFR, Estimated: 45 mL/min — ABNORMAL LOW (ref >60)
Glucose, Bld: 104 mg/dL — ABNORMAL HIGH (ref 70–99)
Potassium: 2.9 mmol/L — ABNORMAL LOW (ref 3.5–5.1)
Sodium: 136 mmol/L (ref 135–145)

## 2023-07-30 MED ORDER — FUROSEMIDE 10 MG/ML IJ SOLN: 60.0000 mg | Freq: Two times a day (BID) | INTRAMUSCULAR | Status: DC

## 2023-07-30 MED ORDER — FUROSEMIDE 10 MG/ML IJ SOLN
60.0000 mg | Freq: Two times a day (BID) | INTRAMUSCULAR | Status: DC
  Administered 2023-07-30 – 2023-07-31 (×3): 60 mg via INTRAVENOUS
  Filled 2023-07-30 (×3): qty 6

## 2023-07-30 NOTE — Progress Notes (Signed)
PROGRESS NOTE    Todd [name]  XBJ:[id number] DOB: [birth year] DOA: 07/27/2023 PCP: Elfredia Nevins, MD   Brief Narrative: Todd [name] is a 73 y.o. male with a history of hypertension, hyperlipidemia, alcohol abuse, tobacco use, PAD, PAD.  Patient presented secondary to foot pain and shortness of breath and was found to have evidence of anasarca. Lasix IV infusion started, transitioned to Lasix IV bolus.   Assessment/Plan:  Anasarca Unclear etiology. He has a history of congestive heart failure, however last Transthoracic Echocardiogram showed a preserved EF without mention of diastolic dysfunction. Albumin is mildly low at 3.1 on admission. Patient also has a history of alcohol abuse, which may be contributory to liver disease; INR elevated. RUQ ultrasound confirms likely cirrhosis. Lasix IV infusion transitioned to Lasix IV bolus. Documented urine output of 3,350 mL with multiple unmeasured occurrences over the last 24 hours. Weight of 135.5 lbs on 2/1. Weight down to 131 lbs today. -Lasix 60 mg IV BID -Strict in/out -Daily weights -Daily BMP while on Lasix  Pulmonary artery hypertension Noted.  AKI Baseline creatinine of about 1.00. Creatinine of 1.39 on admission with peak up to 1.88. Creatinine improved to 1.61. -BMP in AM  PAD Cool right extremity with history of right femoral to popliteal artery bypass grafting. Left foot with ulcers present. Vascular surgery consulted. Left arterial duplex scan significant for 75-99% stenosis of the superficial femoral artery. -Continue aspirin and plavix -Continue Crestor -Vascular surgery recommendations: plan for left lower extremity angiogram on 2/3  Permanent atrial fibrillation Previously documented. Prior attempts at cardioversion have failed. Patient is not on rate or rhythm control medication. He is not on anticoagulation.  Chronic pancreatitis -Continue Creon  Hypothyroidism -Continue  Synthroid  GERD -Continue Protonix  Nicotine use Noted. Nicotine patch ordered on admission.   DVT prophylaxis: Lovenox Code Status:   Code Status: Full Code Family Communication: None at bedside Disposition Plan: Discharge home pending transition off of IV lasix, in addition to ongoing vascular surgery recommendations   Consultants:  Vascular surgery  Procedures:  Left lower extremity arterial duplex Lower extremity venous duplex  Antimicrobials: None   Subjective: Breathing improved after Lasix. Still with LE swelling. No specific concerns.  Objective: BP 111/62 (BP Location: Left Arm)   Pulse (!) 103   Temp 98 F (36.7 C)   Resp 18   Wt 59.4 kg   SpO2 92%   BMI 19.92 kg/m   Examination:  General exam: Appears calm and comfortable Respiratory system: Clear to auscultation. Respiratory effort normal. Cardiovascular system: S1 & S2 heard, RRR. No murmurs, rubs, gallops or clicks. Gastrointestinal system: Abdomen is nondistended, soft and nontender. Normal bowel sounds heard. Central nervous system: Alert and oriented. No focal neurological deficits. Musculoskeletal: Significant bilateral lower extremity pitting edema up to thighs. No calf tenderness Skin: No cyanosis. No rashes Psychiatry: Judgement and insight appear normal. Mood & affect appropriate.    Data Reviewed: I have personally reviewed following labs and imaging studies   Last CBC Lab Results  Component Value Date   WBC 4.6 07/28/2023   HGB 11.3 (L) 07/28/2023   HCT 37.5 (L) 07/28/2023   MCV 92.8 07/28/2023   MCH 28.0 07/28/2023   RDW 18.9 (H) 07/28/2023   PLT 160 07/28/2023     Last metabolic panel Lab Results  Component Value Date   GLUCOSE 104 (H) 07/30/2023   NA 136 07/30/2023   K 2.9 (L) 07/30/2023   CL 93 (L) 07/30/2023   CO2 31  07/30/2023   BUN 20 07/30/2023   CREATININE 1.61 (H) 07/30/2023   GFRNONAA 45 (L) 07/30/2023   CALCIUM 8.1 (L) 07/30/2023   PHOS 2.2 (L)  10/18/2022   PROT 6.4 (L) 07/29/2023   ALBUMIN 2.9 (L) 07/29/2023   BILITOT 0.6 07/29/2023   ALKPHOS 59 07/29/2023   AST 19 07/29/2023   ALT 9 07/29/2023   ANIONGAP 12 07/30/2023     Creatinine Clearance: Estimated Creatinine Clearance: 34.3 mL/min (A) (by C-G formula based on SCr of 1.61 mg/dL (H)).  No results found for this or any previous visit (from the past 240 hours).    Radiology Studies: US Abdomen Limited RUQ (LIVER/GB) Result Date: 07/28/2023 CLINICAL DATA:  Anasarca and elevated INR EXAM: ULTRASOUND ABDOMEN LIMITED RIGHT UPPER QUADRANT COMPARISON:  MRI abdomen dated 05/05/2023 FINDINGS: Gallbladder: No gallstones or wall thickening visualized. No sonographic Murphy sign noted by sonographer. Common bile duct: Diameter: 6 mm Liver: Nodular hepatic contour. Portal vein is patent on color Doppler imaging with normal direction of blood flow towards the liver. Other: Small volume ascites. Partially imaged right pleural effusion. IMPRESSION: 1. Nodular hepatic contour, which can be seen in the setting of cirrhosis. 2. Small volume ascites. 3. Partially imaged right pleural effusion. Electronically Signed   By: Agustin Cree M.D.   On: 07/28/2023 14:06      LOS: 3 days    Jacquelin Hawking, MD [facility] 07/30/2023, 12:38 PM   If 7PM-7AM, please contact night-coverage [email or web address]

## 2023-07-30 NOTE — Plan of Care (Signed)

## 2023-07-30 NOTE — Progress Notes (Signed)
Progress Note    07/30/2023 11:43 AM * No surgery found *  Subjective: Feeling okay, patient does not have hearing aids and has difficulty hearing but wife is at bedside  Vitals:   07/30/23 0444 07/30/23 0806  BP: 105/65 111/62  Pulse: 93 (!) 103  Resp: 18 18  Temp: 97.9 F (36.6 C) 98 F (36.7 C)  SpO2: 91% 92%    Physical Exam: Awake alert and oriented He is not short of breath today There is edema in his legs which is improved per his wife Dressings on both feet  CBC    Component Value Date/Time   WBC 4.6 07/28/2023 0449   RBC 4.04 (L) 07/28/2023 0449   HGB 11.3 (L) 07/28/2023 0449   HCT 37.5 (L) 07/28/2023 0449   PLT 160 07/28/2023 0449   MCV 92.8 07/28/2023 0449   MCH 28.0 07/28/2023 0449   MCHC 30.1 07/28/2023 0449   RDW 18.9 (H) 07/28/2023 0449   LYMPHSABS 1.6 10/17/2022 1202   MONOABS 0.7 10/17/2022 1202   EOSABS 0.0 10/17/2022 1202   BASOSABS 0.1 10/17/2022 1202    BMET    Component Value Date/Time   NA 136 07/30/2023 0735   K 2.9 (L) 07/30/2023 0735   CL 93 (L) 07/30/2023 0735   CO2 31 07/30/2023 0735   GLUCOSE 104 (H) 07/30/2023 0735   BUN 20 07/30/2023 0735   CREATININE 1.61 (H) 07/30/2023 0735   CREATININE 1.25 03/08/2023 1041   CALCIUM 8.1 (L) 07/30/2023 0735   GFRNONAA 45 (L) 07/30/2023 0735   GFRAA  10/06/2010 1259    >60        The eGFR has been calculated using the MDRD equation. This calculation has not been validated in all clinical situations. eGFR's persistently <60 mL/min signify possible Chronic Kidney Disease.    INR    Component Value Date/Time   INR 1.3 (H) 07/28/2023 0449     Intake/Output Summary (Last 24 hours) at 07/30/2023 1143 Last data filed at 07/30/2023 1048 Gross per 24 hour  Intake --  Output 3500 ml  Net -3500 ml   LEFT       PSV cm/sRatioStenosis       Waveform          Comments           +-----------+--------+-----+---------------+------------------+------------  ----+  CFA Distal 112                          monophasic                           +-----------+--------+-----+---------------+------------------+------------  ----+  DFA       45                          biphasic                             +-----------+--------+-----+---------------+------------------+------------  ----+  SFA Prox   61                          monophasic                           +-----------+--------+-----+---------------+------------------+------------  ----+  SFA Mid    524     10.2775-99% stenosismonophasic  Proximal to  mid                                                            segment            +-----------+--------+-----+---------------+------------------+------------  ----+  SFA Distal 35                          monophasic                           +-----------+--------+-----+---------------+------------------+------------  ----+  POP Prox   25                          monophasic                           +-----------+--------+-----+---------------+------------------+------------  ----+  POP Distal 36                          monophasic                           +-----------+--------+-----+---------------+------------------+------------  ----+  ATA Prox   35                          monophasic                           +-----------+--------+-----+---------------+------------------+------------  ----+  ATA Mid    13                          dampened                                                                    monophasic                           +-----------+--------+-----+---------------+------------------+------------  ----+  ATA Distal 9                           dampened                                                                    monophasic                           +-----------+--------+-----+---------------+------------------+------------  ----+   PTA Prox   11  dampened                                                                    monophasic                           +-----------+--------+-----+---------------+------------------+------------  ----+  PTA Distal 22                          monophasic                           +-----------+--------+-----+---------------+------------------+------------  ----+  PERO Prox  19                          monophasic                           +-----------+--------+-----+---------------+------------------+------------  ----+  PERO Mid   15                          dampened                                                                    monophasic                           +-----------+--------+-----+---------------+------------------+------------  ----+  PERO Distal19                          dampened                                                                    monophasic                           +-----------+--------+-----+---------------+------------------+------------  ----+  DP        12                          dampened                                                                    monophasic                           +-----------+--------+-----+---------------+------------------+------------  ----+  Summary:  Left: 75-99% stenosis noted in the superficial femoral artery.   Assessment/plan:  73 y.o. male is with history of bilateral external iliac artery stenting and right common femoral endarterectomy with right femoral to above-knee popliteal artery bypass and additional stenting in 2022.  He also has remote history of carotid endarterectomy.  In the office most recently right lower extremity bypass was noted to be widely patent but he has wounds of the left lower extremity with evidence of left SFA stenosis.  Plan is for angiography tomorrow from the  right common femoral approach.  He remains on aspirin, Plavix and statin.     Katelyn Kohlmeyer C. Randie Heinz, MD [facility] Office: [phone] Pager: [phone]  07/30/2023 11:43 AM

## 2023-07-31 ENCOUNTER — Encounter (HOSPITAL_COMMUNITY)
Admission: EM | Disposition: A | Discharge status: Home / Self Care | Payer: Self-pay | Source: Home / Self Care | Attending: Family Medicine

## 2023-07-31 DIAGNOSIS — R601 Generalized edema: Secondary | ICD-10-CM | POA: Diagnosis not present

## 2023-07-31 DIAGNOSIS — I70245 Atherosclerosis of native arteries of left leg with ulceration of other part of foot: Secondary | ICD-10-CM | POA: Diagnosis not present

## 2023-07-31 HISTORY — PX: ABDOMINAL AORTOGRAM W/LOWER EXTREMITY: CATH118223

## 2023-07-31 LAB — BASIC METABOLIC PANEL
Anion gap: 14 (ref 5–15)
BUN: 18 mg/dL (ref 8–23)
CO2: 31 mmol/L (ref 22–32)
Calcium: 8.1 mg/dL — ABNORMAL LOW (ref 8.9–10.3)
Chloride: 92 mmol/L — ABNORMAL LOW (ref 98–111)
Creatinine, Ser: 1.48 mg/dL — ABNORMAL HIGH (ref 0.61–1.24)
GFR, Estimated: 50 mL/min — ABNORMAL LOW (ref >60)
Glucose, Bld: 86 mg/dL (ref 70–99)
Potassium: 3.7 mmol/L (ref 3.5–5.1)
Sodium: 137 mmol/L (ref 135–145)

## 2023-07-31 LAB — POCT ACTIVATED CLOTTING TIME: Activated Clotting Time: 239 s

## 2023-07-31 SURGERY — ABDOMINAL AORTOGRAM W/LOWER EXTREMITY
Anesthesia: LOCAL

## 2023-07-31 MED ORDER — FENTANYL CITRATE (PF) 100 MCG/2ML IJ SOLN
INTRAMUSCULAR | Status: DC | PRN
  Administered 2023-07-31: 50 ug via INTRAVENOUS

## 2023-07-31 MED ORDER — HEPARIN SODIUM (PORCINE) 1000 UNIT/ML IJ SOLN
INTRAMUSCULAR | Status: AC
  Filled 2023-07-31: qty 10

## 2023-07-31 MED ORDER — FENTANYL CITRATE (PF) 100 MCG/2ML IJ SOLN
INTRAMUSCULAR | Status: AC
  Filled 2023-07-31: qty 2

## 2023-07-31 MED ORDER — SODIUM CHLORIDE 0.9 % IV SOLN
INTRAVENOUS | Status: DC
  Administered 2023-07-31: 1000 mL via INTRAVENOUS

## 2023-07-31 MED ORDER — MIDAZOLAM HCL 2 MG/2ML IJ SOLN
INTRAMUSCULAR | Status: DC | PRN
  Administered 2023-07-31: 1 mg via INTRAVENOUS

## 2023-07-31 MED ORDER — ACETAMINOPHEN 325 MG PO TABS
650.0000 mg | ORAL_TABLET | ORAL | Status: DC | PRN
Start: 2023-07-31 — End: 2023-08-02

## 2023-07-31 MED ORDER — HYDRALAZINE HCL 20 MG/ML IJ SOLN: 5.0000 mg | INTRAMUSCULAR | Status: DC | PRN

## 2023-07-31 MED ORDER — SODIUM CHLORIDE 0.9 % WEIGHT BASED INFUSION: 1.0000 mL/kg/h | INTRAVENOUS | Status: AC

## 2023-07-31 MED ORDER — LIDOCAINE HCL (PF) 1 % IJ SOLN
INTRAMUSCULAR | Status: DC | PRN
  Administered 2023-07-31: 15 mL
  Administered 2023-07-31: 2 mL

## 2023-07-31 MED ORDER — MIDAZOLAM HCL 2 MG/2ML IJ SOLN
INTRAMUSCULAR | Status: AC
  Filled 2023-07-31: qty 2

## 2023-07-31 MED ORDER — SODIUM CHLORIDE 0.9% FLUSH: 3.0000 mL | INTRAVENOUS | Status: DC | PRN

## 2023-07-31 MED ORDER — CLOPIDOGREL BISULFATE 75 MG PO TABS
ORAL_TABLET | ORAL | Status: DC | PRN
  Administered 2023-07-31: 75 mg via ORAL

## 2023-07-31 MED ORDER — HEPARIN (PORCINE) IN NACL 1000-0.9 UT/500ML-% IV SOLN
INTRAVENOUS | Status: DC | PRN
  Administered 2023-07-31 (×2): 500 mL

## 2023-07-31 MED ORDER — ONDANSETRON HCL 4 MG/2ML IJ SOLN: 4.0000 mg | Freq: Four times a day (QID) | INTRAMUSCULAR | Status: DC | PRN

## 2023-07-31 MED ORDER — SODIUM CHLORIDE 0.9% FLUSH
3.0000 mL | Freq: Two times a day (BID) | INTRAVENOUS | Status: DC
  Administered 2023-08-01 (×3): 3 mL via INTRAVENOUS

## 2023-07-31 MED ORDER — LIDOCAINE HCL (PF) 1 % IJ SOLN
INTRAMUSCULAR | Status: AC
  Filled 2023-07-31: qty 30

## 2023-07-31 MED ORDER — CLOPIDOGREL BISULFATE 75 MG PO TABS
ORAL_TABLET | ORAL | Status: AC
  Filled 2023-07-31: qty 1

## 2023-07-31 MED ORDER — SODIUM CHLORIDE 0.9 % IV SOLN: 250.0000 mL | INTRAVENOUS | Status: DC | PRN

## 2023-07-31 MED ORDER — IODIXANOL 320 MG/ML IV SOLN
INTRAVENOUS | Status: DC | PRN
  Administered 2023-07-31: 65 mL

## 2023-07-31 MED ORDER — HEPARIN SODIUM (PORCINE) 1000 UNIT/ML IJ SOLN
INTRAMUSCULAR | Status: DC | PRN
  Administered 2023-07-31: 5000 [IU] via INTRAVENOUS

## 2023-07-31 MED ORDER — LABETALOL HCL 5 MG/ML IV SOLN: 10.0000 mg | INTRAVENOUS | Status: DC | PRN

## 2023-07-31 SURGICAL SUPPLY — 26 items
BALLN COYOTE OTW 2X100X150 (BALLOONS) ×1
BALLN COYOTE OTW 3X100X150 (BALLOONS) ×1
BALLN MUSTANG 5X150X135 (BALLOONS) ×1
BALLOON COYOTE OTW 2X100X150 (BALLOONS) IMPLANT
BALLOON COYOTE OTW 3X100X150 (BALLOONS) IMPLANT
BALLOON MUSTANG 5X150X135 (BALLOONS) IMPLANT
CATH CXI 2.3F 150 ST (CATHETERS) IMPLANT
CATH OMNI FLUSH 5F 65CM (CATHETERS) IMPLANT
CATH QUICKCROSS SUPP .035X90CM (MICROCATHETER) IMPLANT
CATH RUBICON 014 150 (CATHETERS) IMPLANT
CLOSURE MYNX CONTROL 6F/7F (Vascular Products) IMPLANT
DEVICE TORQUE .014-.018 (MISCELLANEOUS) IMPLANT
GLIDEWIRE ADV .035X260CM (WIRE) IMPLANT
KIT ENCORE 26 ADVANTAGE (KITS) IMPLANT
KIT MICROPUNCTURE NIT STIFF (SHEATH) IMPLANT
KIT SINGLE USE MANIFOLD (KITS) IMPLANT
SET ATX-X65L (MISCELLANEOUS) IMPLANT
SHEATH CATAPULT 6FR 45 (SHEATH) IMPLANT
SHEATH MICROPUNCTURE PEDAL 5FR (SHEATH) IMPLANT
SHEATH PINNACLE 5F 10CM (SHEATH) IMPLANT
SHEATH PINNACLE 6F 10CM (SHEATH) IMPLANT
STENT ELUVIA 6X150X130 (Permanent Stent) IMPLANT
TORQUE DEVICE .014-.018 (MISCELLANEOUS) ×1
TRAY PV CATH (CUSTOM PROCEDURE TRAY) ×1 IMPLANT
WIRE BENTSON .035X145CM (WIRE) IMPLANT
WIRE HI TORQ COMMND ES.014X300 (WIRE) IMPLANT

## 2023-07-31 NOTE — Op Note (Signed)
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, left leg Post-operative diagnosis:  Same Surgeon:  Todd Pastures, MD Procedure Performed:  Ultrasound-guided access of right common femoral artery Aortogram and left lower extremity angiogram Third order cannulation of left PT and left AT Ultrasound-guided access of left PT at the ankle Balloon angioplasty of left PT and TP trunk with 2 x 100 & 3 x 100 coyote balloons Stenting of left SFA, 6 x 150 Eluvia, postdilated with 5 x 150 Mustang balloon Mynx closure of right common femoral artery Moderate sedation 91 minutes   Indications: Todd [name] is a 73 year old male with a known history of PAD who is previously underwent bilateral external iliac stenting, right common femoral endarterectomy with right femoral to above-knee popliteal artery bypass.  He currently admitted with left leg swelling, blistering and a left second toe wound.  Given his PAD history a left leg duplex was obtained which demonstrated a severe mid SFA stenosis.  Risks and benefits of angiogram with intervention were reviewed, patient expressed understanding and elected to proceed.  Findings:  Widely patent aorta and bilateral renal arteries.  Widely patent common iliac arteries and external iliac stents bilaterally.  The hypo on the right is patent, the left appears to be occluded.  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  Widely patent profunda artery.  The SFA is patent with multifocal stenoses in the mid segment causing approximate 70% stenosis.  The popliteal artery is widely patent.  All 3 tibial vessels are diminutive proximately.  The AT appears to be patent throughout its course but is very small at the proximal segment.  There is a severe stenosis versus short segment occlusion of the distal TP trunk and into the  PT.   Procedure:  The patient was identified in the holding area and taken to the cath lab  The patient was then placed supine on the table and prepped and draped in the usual sterile fashion.  A time out was called.  Ultrasound was used to evaluate the right common femoral artery.  It was patent .  A digital ultrasound image was acquired.  A micropuncture needle was used to access the right common femoral artery under ultrasound guidance.  An 018 wire was advanced without resistance and a micropuncture sheath was placed.  The 018 wire was removed and a benson wire was placed.  The micropuncture sheath was exchanged for a 5 french sheath.  An omniflush catheter was advanced over the wire to the level of L-1.  An abdominal angiogram was obtained.  Next, using the omniflush catheter and a glide advantage wire, the aortic bifurcation was crossed and the catheter was placed into theleft external iliac artery and left runoff was obtained. This demonstrated the above findings.  The glide advantage wire was replaced through the catheter and into the SFA.  The patient was systemically heparinized and the 6 French sheath was exchanged for a 6 [place] by 45 cm catapult sheath.  Using an 014 command wire and an 014 Rubicon catheter and navigated into the proximal AT.  Multiple attempts were made to get the wire to take the turn of the AT and passed into the mid segment although the wire and catheter became extravascular each time.  I then attempted cannulation of the PT but also when crossing the on the proximal portion the wire and catheter became extravascular.  The left foot was then prepped and draped and the left PT at the ankle was accessed under ultrasound guidance.  Using the 1 4 command wire and a 2.6 [place] CXI catheter I was able to cross the PT and TP trunk stenosis in a retrograde fashion and the wire was placed into the distal SFA.  Given that both wires were confirmed to be in the true lumen in the popliteal  artery I elected to treat the PT and TP trunk lesion from below and the SFA lesion would be treated from the up and over wire from above.  The TP trunk and PT lesions were treated first with a 2 x 100 coyote from the pedal access wire.  Angiography demonstrated minimal response with residual stenosis and for this reason a 3 x 100 coyote was used in this segment.  It should be noted that these were barebacked over the pedal access wire.  The wire from above was exchanged for a 035 glide advantage wire via a quick cross catheter and the SFA lesion was treated with a 6 x 150 Eluvia stent which was postdilated with a 5 x 150 Mustang balloon.  Completion angiography was then obtained which demonstrated an excellent response in the SFA with a widely patent stent and a sufficient response to balloon angioplasty of the TP trunk and PT lesion.  The pedal wire was pulled and manual pressure held.  The long 6 French sheath was exchanged for a short 6 [place] sheath and a minx closure device was deployed into the right common femoral artery access with excellent hemostasis.  Contrast: 65 cc Sedation: 91 minutes  Impression: Maximally revascularized with inline flow to the foot via the PT.  All pedal vessels are noted to be very small.   Todd Pastures MD [facility] [place] Office: [phone]

## 2023-07-31 NOTE — Progress Notes (Signed)
PROGRESS NOTE    Todd [name]  ZOX:[id number] DOB: 1950/10/15 DOA: 07/27/2023 PCP: Elfredia Nevins, MD   Brief Narrative: Todd [name] is a 73 y.o. male with a history of hypertension, hyperlipidemia, alcohol abuse, tobacco use, PAD, PAD.  Patient presented secondary to foot pain and shortness of breath and was found to have evidence of anasarca. Lasix IV infusion started, transitioned to Lasix IV bolus.   Assessment/Plan:  Anasarca Unclear etiology. He has a history of congestive heart failure, however last Transthoracic Echocardiogram showed a preserved EF without mention of diastolic dysfunction. Albumin is mildly low at 3.1 on admission. Patient also has a history of alcohol abuse, which may be contributory to liver disease; INR elevated. RUQ ultrasound confirms likely cirrhosis. Lasix IV infusion transitioned to Lasix IV bolus. Documented urine output of 2,550 mL with multiple unmeasured occurrences over the last 24 hours. Weight of 135.5 lbs on 2/1. Weight down to 127.65 lbs today. -Continue Lasix 60 mg IV BID -Strict in/out -Daily weights -Daily BMP while on Lasix IV  Pulmonary artery hypertension Noted.  AKI Baseline creatinine of about 1.00. Creatinine of 1.39 on admission with peak up to 1.88. Creatinine improved to 1.48. -Daily BMP while on Lasix IV  PAD Cool right extremity with history of right femoral to popliteal artery bypass grafting. Left foot with ulcers present. Vascular surgery consulted. Left arterial duplex scan significant for 75-99% stenosis of the superficial femoral artery. -Continue aspirin and plavix -Continue Crestor -Vascular surgery recommendations: plan for left lower extremity angiogram today, 2/3  Permanent atrial fibrillation Previously documented. Prior attempts at cardioversion have failed. Patient is not on rate or rhythm control medication. He is not on anticoagulation.  Chronic pancreatitis -Continue  Creon  Hypothyroidism -Continue Synthroid  GERD -Continue Protonix  Nicotine use Noted. Nicotine patch ordered on admission.   DVT prophylaxis: Lovenox Code Status:   Code Status: Full Code Family Communication: None at bedside Disposition Plan: Discharge home pending transition off of IV lasix, in addition to ongoing vascular surgery recommendations   Consultants:  Vascular surgery  Procedures:  Left lower extremity arterial duplex Lower extremity venous duplex  Antimicrobials: None   Subjective: No issues this morning. Urinating a lot.  Objective: BP 96/62 (BP Location: Right Arm)   Pulse 86   Temp (!) 97.3 F (36.3 C) (Oral)   Resp 17   Wt 57.9 kg   SpO2 90%   BMI 19.41 kg/m   Examination:  General exam: Appears calm and comfortable Respiratory system: Clear to auscultation. Respiratory effort normal. Cardiovascular system: S1 & S2 heard, RRR. No murmurs. Gastrointestinal system: Abdomen is nondistended, soft and nontender. Normal bowel sounds heard. Central nervous system: Alert and oriented. No focal neurological deficits. Musculoskeletal: Significant bilateral pitting edema up to hips. No calf tenderness Psychiatry: Judgement and insight appear normal. Mood & affect appropriate.    Data Reviewed: I have personally reviewed following labs and imaging studies   Last CBC Lab Results  Component Value Date   WBC 4.6 07/28/2023   HGB 11.3 (L) 07/28/2023   HCT 37.5 (L) 07/28/2023   MCV 92.8 07/28/2023   MCH 28.0 07/28/2023   RDW 18.9 (H) 07/28/2023   PLT 160 07/28/2023     Last metabolic panel Lab Results  Component Value Date   GLUCOSE 86 07/31/2023   NA 137 07/31/2023   K 3.7 07/31/2023   CL 92 (L) 07/31/2023   CO2 31 07/31/2023   BUN 18 07/31/2023   CREATININE 1.48 (H)  07/31/2023   GFRNONAA 50 (L) 07/31/2023   CALCIUM 8.1 (L) 07/31/2023   PHOS 2.2 (L) 10/18/2022   PROT 6.4 (L) 07/29/2023   ALBUMIN 2.9 (L) 07/29/2023   BILITOT 0.6  07/29/2023   ALKPHOS 59 07/29/2023   AST 19 07/29/2023   ALT 9 07/29/2023   ANIONGAP 14 07/31/2023     Creatinine Clearance: Estimated Creatinine Clearance: 36.4 mL/min (A) (by C-G formula based on SCr of 1.48 mg/dL (H)).  No results found for this or any previous visit (from the past 240 hours).    Radiology Studies: No results found.     LOS: 4 days    Jacquelin Hawking, MD [facility] 07/31/2023, 9:21 AM   If 7PM-7AM, please contact night-coverage [email or web address]

## 2023-07-31 NOTE — Plan of Care (Signed)

## 2023-07-31 NOTE — Care Management Important Message (Signed)
Important Message  Patient Details  Name: Todd [name] MRN: [id number] Date of Birth: [birth year]   Important Message Given:  Yes - Medicare IM  Patient left prior to IM delivery will mail to the patient home address.   Todd [name] 07/31/2023, 3:34 PM

## 2023-07-31 NOTE — Plan of Care (Signed)

## 2023-07-31 NOTE — Progress Notes (Signed)
Pt arrived from ...cath.., A/ox 4...pt denies any pain, MD aware,CCMD called. CHG bath given,no further needs at this time   

## 2023-07-31 NOTE — Progress Notes (Addendum)
  Progress Note    07/31/2023 8:48 AM * No surgery found *  Subjective:  denies SOB. Eager for procedure today   Vitals:   07/31/23 0615 07/31/23 0809  BP: 104/75 96/62  Pulse: 63 86  Resp: 17 17  Temp:  (!) 97.3 F (36.3 C)  SpO2:  90%   Physical Exam: Lungs:  non labored Extremities:  dressing left in place L foot Neurologic: A&O  CBC    Component Value Date/Time   WBC 4.6 07/28/2023 0449   RBC 4.04 (L) 07/28/2023 0449   HGB 11.3 (L) 07/28/2023 0449   HCT 37.5 (L) 07/28/2023 0449   PLT 160 07/28/2023 0449   MCV 92.8 07/28/2023 0449   MCH 28.0 07/28/2023 0449   MCHC 30.1 07/28/2023 0449   RDW 18.9 (H) 07/28/2023 0449   LYMPHSABS 1.6 10/17/2022 1202   MONOABS 0.7 10/17/2022 1202   EOSABS 0.0 10/17/2022 1202   BASOSABS 0.1 10/17/2022 1202    BMET    Component Value Date/Time   NA 137 07/31/2023 0631   K 3.7 07/31/2023 0631   CL 92 (L) 07/31/2023 0631   CO2 31 07/31/2023 0631   GLUCOSE 86 07/31/2023 0631   BUN 18 07/31/2023 0631   CREATININE 1.48 (H) 07/31/2023 0631   CREATININE 1.25 03/08/2023 1041   CALCIUM 8.1 (L) 07/31/2023 0631   GFRNONAA 50 (L) 07/31/2023 0631   GFRAA  10/06/2010 1259    >60        The eGFR has been calculated using the MDRD equation. This calculation has not been validated in all clinical situations. eGFR's persistently <60 mL/min signify possible Chronic Kidney Disease.    INR    Component Value Date/Time   INR 1.3 (H) 07/28/2023 0449     Intake/Output Summary (Last 24 hours) at 07/31/2023 0848 Last data filed at 07/31/2023 7846 Gross per 24 hour  Intake --  Output 2550 ml  Net -2550 ml     Assessment/Plan:  73 y.o. male with chronic wounds LLE  Subjectively breathing much better Plan is for LLE arteriogram today with possible intervention; he has no further questions about procedure this morning Continue npo Consent on paper chart    Emilie Rutter, PA-C [facility] [phone] 07/31/2023 8:48 AM  VASCULAR STAFF ADDENDUM: I have independently interviewed and examined the patient. I agree with the above.  LLE angiogram in cath lab today  Daria Pastures MD [facility] Phone Number: [phone] 07/31/2023 11:02 AM

## 2023-08-01 ENCOUNTER — Encounter (HOSPITAL_COMMUNITY): Payer: Self-pay | Admitting: Vascular Surgery

## 2023-08-01 DIAGNOSIS — N179 Acute kidney failure, unspecified: Secondary | ICD-10-CM | POA: Diagnosis not present

## 2023-08-01 DIAGNOSIS — R601 Generalized edema: Secondary | ICD-10-CM | POA: Diagnosis not present

## 2023-08-01 DIAGNOSIS — I739 Peripheral vascular disease, unspecified: Secondary | ICD-10-CM | POA: Diagnosis not present

## 2023-08-01 DIAGNOSIS — I70245 Atherosclerosis of native arteries of left leg with ulceration of other part of foot: Secondary | ICD-10-CM | POA: Diagnosis not present

## 2023-08-01 LAB — BASIC METABOLIC PANEL
Anion gap: 13 (ref 5–15)
BUN: 19 mg/dL (ref 8–23)
CO2: 31 mmol/L (ref 22–32)
Calcium: 7.7 mg/dL — ABNORMAL LOW (ref 8.9–10.3)
Chloride: 91 mmol/L — ABNORMAL LOW (ref 98–111)
Creatinine, Ser: 1.57 mg/dL — ABNORMAL HIGH (ref 0.61–1.24)
GFR, Estimated: 46 mL/min — ABNORMAL LOW (ref >60)
Glucose, Bld: 101 mg/dL — ABNORMAL HIGH (ref 70–99)
Potassium: 2.8 mmol/L — ABNORMAL LOW (ref 3.5–5.1)
Sodium: 135 mmol/L (ref 135–145)

## 2023-08-01 LAB — POTASSIUM: Potassium: 3.4 mmol/L — ABNORMAL LOW (ref 3.5–5.1)

## 2023-08-01 MED ORDER — FUROSEMIDE 40 MG PO TABS
40.0000 mg | ORAL_TABLET | Freq: Every day | ORAL | Status: DC
  Administered 2023-08-02: 40 mg via ORAL
  Filled 2023-08-01: qty 1

## 2023-08-01 MED ORDER — POTASSIUM CHLORIDE CRYS ER 20 MEQ PO TBCR
40.0000 meq | EXTENDED_RELEASE_TABLET | ORAL | Status: AC
Start: 2023-08-01 — End: 2023-08-01
  Administered 2023-08-01 (×2): 40 meq via ORAL
  Filled 2023-08-01 (×2): qty 2

## 2023-08-01 MED ORDER — POTASSIUM CHLORIDE CRYS ER 20 MEQ PO TBCR
40.0000 meq | EXTENDED_RELEASE_TABLET | Freq: Once | ORAL | Status: AC
  Administered 2023-08-01: 40 meq via ORAL
  Filled 2023-08-01: qty 2

## 2023-08-01 NOTE — Progress Notes (Signed)
Patient desats during sleep to low 80's, therefore placed on 2L nasal cannula and sats remain in 96-99%.

## 2023-08-01 NOTE — Discharge Instructions (Signed)

## 2023-08-01 NOTE — Progress Notes (Signed)
 PROGRESS NOTE    Todd [name]  FMW:[id number] DOB: 1951-06-08 DOA: 07/27/2023 PCP: Bertell Satterfield, MD   Brief Narrative: Todd [name] is a 73 y.o. male with a history of hypertension, hyperlipidemia, alcohol  abuse, tobacco use, PAD, PAD.  Patient presented secondary to foot pain and shortness of breath and was found to have evidence of anasarca. Lasix  IV infusion started, transitioned to Lasix  IV bolus. Vascular surgery was consulted for left extremity ulcers in setting of PAD and patient underwent balloon angioplasty with left SFA stent placement.   Assessment/Plan:  Anasarca He has a history of congestive heart failure, however last Transthoracic Echocardiogram showed a preserved EF without mention of diastolic dysfunction. Albumin  is mildly low at 3.1 on admission. Patient also has a history of alcohol  abuse, which may be contributory to liver disease; INR elevated. RUQ ultrasound confirms likely cirrhosis. Lasix  IV infusion transitioned to Lasix  IV bolus. Documented urine output of 1,025 mL over the last 24 hours. Weight of 135.5 lbs on 2/1. Weight up a bit from the day prior up to 128.2 lbs today. Still with significant LE edema. Can not use compression stockings secondary to PAD. -Discontinue Lasix  IV and transition to Lasix  PO in AM -Strict in/out -Daily weights  Pulmonary artery hypertension Noted.  AKI Baseline creatinine of about 1.00. Creatinine of 1.39 on admission with peak up to 1.88. Creatinine improved to 1.48, however slightly worsened to 1.57 today. Patient received IV fluids per vascular surgery yesterday -Daily BMP  PAD Cool right extremity with history of right femoral to popliteal artery bypass grafting. Left foot with ulcers present. Vascular surgery consulted. Left arterial duplex scan significant for 75-99% stenosis of the superficial femoral artery. Patient underwent balloon angioplasty with stenting of left SFA. -Continue aspirin  and  plavix  -Continue Crestor  -Vascular surgery recommendations: okay to discharge with outpatient follow-up  Permanent atrial fibrillation Previously documented. Prior attempts at cardioversion have failed. Patient is not on rate or rhythm control medication. He is not on anticoagulation.  Chronic pancreatitis -Continue Creon   Hypotension Post-procedure. Possibly related to sedation, in addition to diuresis. -Will avoid IV fluids if possible -Hold Lasix  IV  Hypokalemia Likely in part/in whole related to Lasix  IV. -Potassium supplementation  Hypothyroidism -Continue Synthroid   GERD -Continue Protonix   Nicotine  use Noted. Nicotine  patch ordered on admission.   DVT prophylaxis: Lovenox  Code Status:   Code Status: Full Code Family Communication: None at bedside Disposition Plan: Discharge home pending transition off of IV lasix , in addition to ongoing vascular surgery recommendations   Consultants:  Vascular surgery  Procedures:  Left lower extremity arterial duplex Lower extremity venous duplex Vascular surgery Ultrasound-guided access of right common femoral artery Aortogram and left lower extremity angiogram Third order cannulation of left PT and left AT Ultrasound-guided access of left PT at the ankle Balloon angioplasty of left PT and TP trunk with 2 x 100 & 3 x 100 coyote balloons Stenting of left SFA, 6 x 150 Eluvia, postdilated with 5 x 150 Mustang balloon Mynx closure of right common femoral artery  Antimicrobials: None   Subjective: Patient reports feeling well. Breathing well. No specific concerns. Eager to discharge.  Objective: BP 93/66 (BP Location: Left Arm)   Pulse 85   Temp 98.2 F (36.8 C) (Oral)   Resp 16   Ht 5' 8 (1.727 m)   Wt 58.2 kg   SpO2 100%   BMI 19.49 kg/m   Examination:  General exam: Appears calm and comfortable Respiratory system: Clear to auscultation.  Respiratory effort normal. Cardiovascular system: S1 & S2 heard,  RRR. Gastrointestinal system: Abdomen is nondistended, soft and nontender. Normal bowel sounds heard. Central nervous system: Alert and oriented. No focal neurological deficits. Musculoskeletal: Significant bilateral pitting edema up to hips. No calf tenderness Psychiatry: Judgement and insight appear normal. Mood & affect appropriate.    Data Reviewed: I have personally reviewed following labs and imaging studies   Last CBC Lab Results  Component Value Date   WBC 4.6 07/28/2023   HGB 11.3 (L) 07/28/2023   HCT 37.5 (L) 07/28/2023   MCV 92.8 07/28/2023   MCH 28.0 07/28/2023   RDW 18.9 (H) 07/28/2023   PLT 160 07/28/2023     Last metabolic panel Lab Results  Component Value Date   GLUCOSE 101 (H) 08/01/2023   NA 135 08/01/2023   K 2.8 (L) 08/01/2023   CL 91 (L) 08/01/2023   CO2 31 08/01/2023   BUN 19 08/01/2023   CREATININE 1.57 (H) 08/01/2023   GFRNONAA 46 (L) 08/01/2023   CALCIUM  7.7 (L) 08/01/2023   PHOS 2.2 (L) 10/18/2022   PROT 6.4 (L) 07/29/2023   ALBUMIN  2.9 (L) 07/29/2023   BILITOT 0.6 07/29/2023   ALKPHOS 59 07/29/2023   AST 19 07/29/2023   ALT 9 07/29/2023   ANIONGAP 13 08/01/2023     Creatinine Clearance: Estimated Creatinine Clearance: 34.5 mL/min (A) (by C-G formula based on SCr of 1.57 mg/dL (H)).  No results found for this or any previous visit (from the past 240 hours).    Radiology Studies: PERIPHERAL VASCULAR CATHETERIZATION Result Date: 07/31/2023 Images from the original result were not included. 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 Eskew is a 73 year old male with a known history of PAD who is previously underwent bilateral external iliac stenting, right common femoral endarterectomy with right femoral to above-knee popliteal artery bypass.  He currently admitted with left leg swelling, blistering and a left second toe wound.  Given his PAD history a left leg duplex was obtained which demonstrated a severe mid SFA stenosis.  Risks and benefits of angiogram with intervention were reviewed, patient expressed understanding and elected to proceed. Findings: Widely patent aorta and bilateral renal arteries.  Widely patent common iliac arteries and external iliac stents bilaterally.  The hypo on the right is patent, the left appears to be occluded. 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  Widely patent profunda artery.  The SFA is patent with multifocal stenoses in the mid segment causing approximate 70% stenosis.  The popliteal artery is widely patent.  All 3 tibial vessels are diminutive proximately.  The AT appears to be patent throughout its course but is very small at the proximal segment.  There is a severe stenosis versus short segment occlusion of the distal TP trunk and into the PT.  Procedure:  The patient was identified in the holding area and  taken to the cath lab  The patient was then placed supine on the table and prepped and draped in the usual sterile fashion.  A time out was called.  Ultrasound was used to evaluate the right common femoral artery.  It was patent .  A digital ultrasound image was acquired.  A micropuncture needle was used to access the right common femoral artery under ultrasound guidance.  An 018 wire was advanced without resistance and a  micropuncture sheath was placed.  The 018 wire was removed and a benson wire was placed.  The micropuncture sheath was exchanged for a 5 french sheath.  An omniflush catheter was advanced over the wire to the level of L-1.  An abdominal angiogram was obtained.  Next, using the omniflush catheter and a glide advantage wire, the aortic bifurcation was crossed and the catheter was placed into theleft external iliac artery and left runoff was obtained. This demonstrated the above findings.  The glide advantage wire was replaced through the catheter and into the SFA.  The patient was systemically heparinized and the 6 French sheath was exchanged for a 6 French by 45 cm catapult sheath.  Using an 014 command wire and an 014 Rubicon catheter and navigated into the proximal AT.  Multiple attempts were made to get the wire to take the turn of the AT and passed into the mid segment although the wire and catheter became extravascular each time.  I then attempted cannulation of the PT but also when crossing the on the proximal portion the wire and catheter became extravascular.  The left foot was then prepped and draped and the left PT at the ankle was accessed under ultrasound guidance.  Using the 1 4 command wire and a 2.6 French CXI catheter I was able to cross the PT and TP trunk stenosis in a retrograde fashion and the wire was placed into the distal SFA.  Given that both wires were confirmed to be in the true lumen in the popliteal artery I elected to treat the PT and TP trunk lesion from below and the SFA lesion would be treated from the up and over wire from above.  The TP trunk and PT lesions were treated first with a 2 x 100 coyote from the pedal access wire.  Angiography demonstrated minimal response with residual stenosis and for this reason a 3 x 100 coyote was used in this segment.  It should be noted that these were barebacked over the pedal access wire.  The wire from above was exchanged for a 035 glide  advantage wire via a quick cross catheter and the SFA lesion was treated with a 6 x 150 Eluvia stent which was postdilated with a 5 x 150 Mustang balloon.  Completion angiography was then obtained which demonstrated an excellent response in the SFA with a widely patent stent and a sufficient response to balloon angioplasty of the TP trunk and PT lesion.  The pedal wire was pulled and manual pressure held.  The long 6 French sheath was exchanged for a short 6 French sheath and a minx closure device was deployed into the right common femoral artery access with excellent hemostasis. Contrast: 65 cc Sedation: 91 minutes Impression: Maximally revascularized with inline flow to the foot via the PT.  All pedal vessels are noted to be very small. Norman GORMAN Serve MD [facility] [place] Office: [phone]       LOS: 5 days    Elgin Lam, MD  [facility] 08/01/2023, 7:56 AM   If 7PM-7AM, please contact night-coverage [email or web address]

## 2023-08-01 NOTE — Progress Notes (Addendum)
  Progress Note    08/01/2023 7:47 AM 1 Day Post-Op  Subjective:  no complaints   Vitals:   08/01/23 0300 08/01/23 0729  BP: 97/71 (!) 85/64  Pulse: 87 85  Resp: 17 17  Temp: 98.2 F (36.8 C) 97.9 F (36.6 C)  SpO2: 100% 99%   Physical Exam: Lungs:  non labored Incisions:  R groin access site without hematoma Extremities:  brisk L PT by doppler; brisk R PT by doppler Neurologic: A&O  CBC    Component Value Date/Time   WBC 4.6 07/28/2023 0449   RBC 4.04 (L) 07/28/2023 0449   HGB 11.3 (L) 07/28/2023 0449   HCT 37.5 (L) 07/28/2023 0449   PLT 160 07/28/2023 0449   MCV 92.8 07/28/2023 0449   MCH 28.0 07/28/2023 0449   MCHC 30.1 07/28/2023 0449   RDW 18.9 (H) 07/28/2023 0449   LYMPHSABS 1.6 10/17/2022 1202   MONOABS 0.7 10/17/2022 1202   EOSABS 0.0 10/17/2022 1202   BASOSABS 0.1 10/17/2022 1202    BMET    Component Value Date/Time   NA 135 08/01/2023 0350   K 2.8 (L) 08/01/2023 0350   CL 91 (L) 08/01/2023 0350   CO2 31 08/01/2023 0350   GLUCOSE 101 (H) 08/01/2023 0350   BUN 19 08/01/2023 0350   CREATININE 1.57 (H) 08/01/2023 0350   CREATININE 1.25 03/08/2023 1041   CALCIUM  7.7 (L) 08/01/2023 0350   GFRNONAA 46 (L) 08/01/2023 0350   GFRAA  10/06/2010 1259    >60        The eGFR has been calculated using the MDRD equation. This calculation has not been validated in all clinical situations. eGFR's persistently <60 mL/min signify possible Chronic Kidney Disease.    INR    Component Value Date/Time   INR 1.3 (H) 07/28/2023 0449     Intake/Output Summary (Last 24 hours) at 08/01/2023 0747 Last data filed at 08/01/2023 0316 Gross per 24 hour  Intake 69.47 ml  Output 1025 ml  Net -955.53 ml     Assessment/Plan:  73 y.o. male is s/p L SFA stent, PT and TP trunk balloon angioplasty 1 Day Post-Op   LLE well perfused with brisk PT signal by doppler; LLE circulation is optimized after intervention.  R groin access site without hematoma.  Continue  aspirin , plavix , statin daily.  Ok for d/c from vascular standpoint.  Office will arrange duplex in 4-6 weeks.   Donnice Sender, PA-C [facility] [phone] 08/01/2023 7:47 AM  VASCULAR STAFF ADDENDUM: I have independently interviewed and examined the patient. I agree with the above.  Access site without concerns. Multiphasic PT Outpatient follow up arranged.   Norman GORMAN Serve MD [facility] Phone Number: [phone] 08/01/2023 11:37 AM

## 2023-08-01 NOTE — Plan of Care (Signed)

## 2023-08-02 DIAGNOSIS — R601 Generalized edema: Secondary | ICD-10-CM | POA: Diagnosis not present

## 2023-08-02 LAB — BASIC METABOLIC PANEL
Anion gap: 12 (ref 5–15)
BUN: 18 mg/dL (ref 8–23)
CO2: 28 mmol/L (ref 22–32)
Calcium: 7.9 mg/dL — ABNORMAL LOW (ref 8.9–10.3)
Chloride: 95 mmol/L — ABNORMAL LOW (ref 98–111)
Creatinine, Ser: 1.53 mg/dL — ABNORMAL HIGH (ref 0.61–1.24)
GFR, Estimated: 48 mL/min — ABNORMAL LOW (ref >60)
Glucose, Bld: 80 mg/dL (ref 70–99)
Potassium: 4 mmol/L (ref 3.5–5.1)
Sodium: 135 mmol/L (ref 135–145)

## 2023-08-02 NOTE — Discharge Summary (Signed)
 Physician Discharge Summary   Patient: Todd [name] MRN: [id number] DOB: Jun 04, 1951  Admit date:     07/27/2023  Discharge date: 08/02/23  Discharge Physician: Elgin Lam, MD   PCP: Bertell Satterfield, MD   Recommendations at discharge:  PCP visit for hospital follow-up Vascular surgery follow-up GI follow-up for cirrhosis Repeat BMP in 3-7 days  Discharge Diagnoses: Principal Problem:   Anasarca Active Problems:   Atrial fibrillation (HCC)   PAD (peripheral artery disease) (HCC)   Nicotine  abuse   AKI (acute kidney injury) (HCC)   Pulmonary artery hypertension (HCC)  Resolved Problems:   * No resolved hospital problems. *  Hospital Course: Todd [name] is a 73 y.o. male with a history of hypertension, hyperlipidemia, alcohol  abuse, tobacco use, PAD, PAD.  Patient presented secondary to foot pain and shortness of breath and was found to have evidence of anasarca. Lasix  IV infusion started, transitioned to Lasix  IV bolus. Vascular surgery was consulted for left extremity ulcers in setting of PAD and patient underwent balloon angioplasty with left SFA stent placement.   Assessment and Plan:  Anasarca He has a history of congestive heart failure, however last Transthoracic Echocardiogram showed a preserved EF without mention of diastolic dysfunction. Albumin  is mildly low at 3.1 on admission. Patient also has a history of alcohol  abuse, which may be contributory to liver disease; INR elevated. RUQ ultrasound confirms likely cirrhosis. Lasix  IV infusion transitioned to Lasix  IV bolus. Documented urine output of 550 mL over the last 24 hours an a total of 7 L with multiple unmeasured occurrences. Weight of 135.5 lbs on 2/1. Weight of 130.07 lbs on day of discharge. Still with significant LE edema. Can not use compression stockings secondary to PAD. Patient transitioned to Lasix  PO secondary to hypotension from continued Lasix  IV. Discharge on home regimen. Follow-up with GI    Pulmonary artery hypertension Noted. Outpatient follow-up.   AKI Baseline creatinine of about 1.00. Creatinine of 1.39 on admission with peak up to 1.88. Creatinine improved to 1.48, however slightly worsened to 1.57 today. Patient received IV fluids per vascular surgery yesterday -Daily BMP   PAD Cool right extremity with history of right femoral to popliteal artery bypass grafting. Left foot with ulcers present. Vascular surgery consulted. Left arterial duplex scan significant for 75-99% stenosis of the superficial femoral artery. Patient underwent balloon angioplasty with stenting of left SFA. Continue aspirin  and Plavix . Follow-up with vascular surgery.   Permanent atrial fibrillation Previously documented. Prior attempts at cardioversion have failed. Patient is not on rate or rhythm control medication. He is not on anticoagulation.   Chronic pancreatitis Continue Creon    Hypotension Post-procedure. Possibly related to sedation, in addition to diuresis. Blood pressure stable. Negative orthostatic vital signs.    Hypokalemia Likely in part/in whole related to Lasix  IV. Resolved with supplementation.   Hypothyroidism Continue Synthroid    GERD Continue Protonix    Nicotine  use Noted. Nicotine  patch ordered on admission.   Consultants:  Vascular surgery   Procedures:  Left lower extremity arterial duplex Lower extremity venous duplex Vascular surgery Ultrasound-guided access of right common femoral artery Aortogram and left lower extremity angiogram Third order cannulation of left PT and left AT Ultrasound-guided access of left PT at the ankle Balloon angioplasty of left PT and TP trunk with 2 x 100 & 3 x 100 coyote balloons Stenting of left SFA, 6 x 150 Eluvia, postdilated with 5 x 150 Mustang balloon Mynx closure of right common femoral artery   Disposition: Home Diet  recommendation: Low sodium   DISCHARGE MEDICATION: Allergies as of 08/02/2023   No Known  Allergies      Medication List     TAKE these medications    aspirin  EC 81 MG tablet Take 1 tablet (81 mg total) by mouth daily at 6 (six) AM. Swallow whole.   cholecalciferol  25 MCG (1000 UNIT) tablet Commonly known as: VITAMIN D3 Take 1,000 Units by mouth daily.   clopidogrel  75 MG tablet Commonly known as: PLAVIX  TAKE ONE TABLET BY MOUTH ONCE DAILY.   diclofenac Sodium 1 % Gel Commonly known as: VOLTAREN Apply 4 g topically 4 (four) times daily as needed (pain).   Ensure Take 1 Can by mouth daily.   Farxiga  10 MG Tabs tablet Generic drug: dapagliflozin  propanediol Take 1 tablet (10 mg total) by mouth daily before breakfast.   furosemide  20 MG tablet Commonly known as: LASIX  Take 1 tablet (20 mg total) by mouth daily.   levothyroxine  25 MCG tablet Commonly known as: SYNTHROID  Take 25 mcg by mouth daily before breakfast.   lipase/protease/amylase 63999 UNITS Cpep capsule Commonly known as: Creon  Take 2 capsules (72,000 Units total) by mouth 3 (three) times daily before meals. And 1 capsule with snacks   pantoprazole  40 MG tablet Commonly known as: Protonix  Take 1 tablet (40 mg total) by mouth 2 (two) times daily.   rosuvastatin  10 MG tablet Commonly known as: CRESTOR  TAKE (1) TABLET BY MOUTH ONCE DAILY.        Follow-up Information     [facility] Follow up in 6 week(s).   Specialty: Vascular Surgery Contact information: [address] [place]  [address] [phone]        Bertell Satterfield, MD. Schedule an appointment as soon as possible for a visit in 1 week(s).   Specialty: Internal Medicine Why: For hospital follow-up Contact information: [address] [place] [address] [phone]                Discharge Exam: BP 92/74 (BP Location: Left Arm)   Pulse 88   Temp 97.9 F (36.6 C) (Oral)   Resp 18   Ht 5' 8 (1.727 m)   Wt 59 kg   SpO2 95%   BMI 19.78  kg/m   General exam: Appears calm and comfortable Respiratory system: Clear to auscultation. Respiratory effort normal. Cardiovascular system: S1 & S2 heard, RRR. Gastrointestinal system: Abdomen is nondistended, soft and nontender. Normal bowel sounds heard. Central nervous system: Alert and oriented. No focal neurological deficits. Musculoskeletal: Significant BLE edema. No calf tenderness Psychiatry: Judgement and insight appear normal. Mood & affect appropriate.   Condition at discharge: stable  The results of significant diagnostics from this hospitalization (including imaging, microbiology, ancillary and laboratory) are listed below for reference.   Imaging Studies: PERIPHERAL VASCULAR CATHETERIZATION Result Date: 07/31/2023 Images from the original result were not included. 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 Spratley is a 73 year old male with a known history of PAD who is previously underwent bilateral external iliac stenting, right common femoral endarterectomy with right femoral to above-knee popliteal artery bypass.  He currently admitted with left leg swelling, blistering and a left second toe wound.  Given his PAD history a left leg duplex was obtained which demonstrated a severe mid SFA stenosis.  Risks and benefits of angiogram with  intervention were reviewed, patient expressed understanding and elected to proceed. Findings: Widely patent aorta and bilateral renal arteries.  Widely patent common iliac arteries and external iliac stents bilaterally.  The hypo on the right is patent, the left appears to be occluded. 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  Widely patent profunda artery.  The SFA is patent with multifocal stenoses in the mid segment causing approximate 70% stenosis.  The popliteal artery is widely patent.  All 3 tibial vessels are diminutive proximately.  The AT appears to be patent throughout its course but is very small at the proximal segment.  There is a severe stenosis versus short segment occlusion of the distal TP trunk and into the PT.  Procedure:  The patient was identified in the holding area and taken to the cath lab  The patient was then placed supine on the table and prepped and draped in the usual sterile fashion.  A time out was called.  Ultrasound was used to evaluate the right common femoral artery.  It was patent .  A digital ultrasound image was acquired.  A micropuncture needle was used to access the right common femoral artery under ultrasound guidance.  An 018 wire was advanced without resistance and a micropuncture sheath was placed.  The 018 wire was removed and a benson wire was placed.  The micropuncture sheath was exchanged for a 5 french sheath.  An omniflush catheter was advanced over the wire to the level of L-1.  An abdominal angiogram was obtained.  Next, using the omniflush catheter and a glide advantage wire, the aortic bifurcation was crossed and the catheter was placed into theleft external iliac artery and left runoff was obtained. This demonstrated the above findings.  The glide advantage wire was replaced through the catheter and into the SFA.  The patient was systemically heparinized and the 6 French sheath was exchanged for a 6 French by 45 cm catapult sheath.   Using an 014 command wire and an 014 Rubicon catheter and navigated into the proximal AT.  Multiple attempts were made to get the wire to take the turn of the AT and passed into the mid segment although the wire and catheter became extravascular each time.  I then attempted cannulation of the PT but also when crossing the on the proximal portion the wire and catheter became extravascular.  The left foot was then prepped and draped and the left PT at the ankle was accessed under ultrasound guidance.  Using the 1 4 command wire and a 2.6 French CXI catheter I was able to cross the PT and TP trunk stenosis in a retrograde fashion and the wire was placed into the distal SFA.  Given that both wires were confirmed to be in the true lumen in the popliteal artery I elected to treat the PT and TP trunk lesion from below and the SFA lesion would be treated from the up and over wire from above.  The TP trunk and PT lesions were treated  first with a 2 x 100 coyote from the pedal access wire.  Angiography demonstrated minimal response with residual stenosis and for this reason a 3 x 100 coyote was used in this segment.  It should be noted that these were barebacked over the pedal access wire.  The wire from above was exchanged for a 035 glide advantage wire via a quick cross catheter and the SFA lesion was treated with a 6 x 150 Eluvia stent which was postdilated with a 5 x 150 Mustang balloon.  Completion angiography was then obtained which demonstrated an excellent response in the SFA with a widely patent stent and a sufficient response to balloon angioplasty of the TP trunk and PT lesion.  The pedal wire was pulled and manual pressure held.  The long 6 French sheath was exchanged for a short 6 French sheath and a minx closure device was deployed into the right common femoral artery access with excellent hemostasis. Contrast: 65 cc Sedation: 91 minutes Impression: Maximally revascularized with inline flow to the foot via the  PT.  All pedal vessels are noted to be very small. Norman GORMAN Serve MD [facility] Office: [phone]   VAS US  LOWER EXTREMITY ARTERIAL DUPLEX Result Date: 07/29/2023 LOWER EXTREMITY ARTERIAL DUPLEX STUDY Patient Name:  Todd [name]  Date of Exam:   07/28/2023 Medical Rec #: [id number]           Accession #:    [id number] Date of Birth: 08-05-1950          Patient Gender: M Patient Age:   54 years Exam Location:  [facility] Procedure:      VAS US  LOWER EXTREMITY ARTERIAL DUPLEX Referring Phys: [facility] GOEL --------------------------------------------------------------------------------  Indications: Peripheral artery disease, and Bilateral lower extremity              blistering, wound on the left first and second toe. High Risk Factors: Hypertension, current smoker. Other Factors: Atrial fibrillation, carotid artery disease, CHF with pulmonary                hypertension.  Vascular Interventions: 03/15/21 - Right EIA stent. 03/12/21 - Right femoral to                         above knee bypass. 03/02/21 - Left EIA stent. Current ABI:            Non compressible vessels on 07/20/23 Performing Technologist: Ricka Holland RDMS, RVT  Examination Guidelines: A complete evaluation includes B-mode imaging, spectral Doppler, color Doppler, and power Doppler as needed of all accessible portions of each vessel. Bilateral testing is considered an integral part of a complete examination. Limited examinations for reoccurring indications may be performed as noted.   +-----------+--------+-----+---------------+------------------+----------------+ LEFT       PSV cm/sRatioStenosis       Waveform          Comments         +-----------+--------+-----+---------------+------------------+----------------+ CFA Distal 112                         monophasic                         +-----------+--------+-----+---------------+------------------+----------------+ DFA        45  biphasic                           +-----------+--------+-----+---------------+------------------+----------------+ SFA Prox   61                          monophasic                         +-----------+--------+-----+---------------+------------------+----------------+ SFA Mid    524     10.2775-99% stenosismonophasic        Proximal to mid                                                           segment          +-----------+--------+-----+---------------+------------------+----------------+ SFA Distal 35                          monophasic                         +-----------+--------+-----+---------------+------------------+----------------+ POP Prox   25                          monophasic                         +-----------+--------+-----+---------------+------------------+----------------+ POP Distal 36                          monophasic                         +-----------+--------+-----+---------------+------------------+----------------+ ATA Prox   35                          monophasic                         +-----------+--------+-----+---------------+------------------+----------------+ ATA Mid    13                          dampened                                                                  monophasic                         +-----------+--------+-----+---------------+------------------+----------------+ ATA Distal 9                           dampened  monophasic                         +-----------+--------+-----+---------------+------------------+----------------+ PTA Prox   11                          dampened                                                                  monophasic                         +-----------+--------+-----+---------------+------------------+----------------+ PTA Distal 22                           monophasic                         +-----------+--------+-----+---------------+------------------+----------------+ PERO Prox  19                          monophasic                         +-----------+--------+-----+---------------+------------------+----------------+ PERO Mid   15                          dampened                                                                  monophasic                         +-----------+--------+-----+---------------+------------------+----------------+ PERO Distal19                          dampened                                                                  monophasic                         +-----------+--------+-----+---------------+------------------+----------------+ DP         12                          dampened  monophasic                         +-----------+--------+-----+---------------+------------------+----------------+  Summary: Left: 75-99% stenosis noted in the superficial femoral artery.  See table(s) above for measurements and observations. Electronically signed by Penne Colorado MD on 07/29/2023 at 4:06:18 PM.    Final    VAS US  LOWER EXTREMITY VENOUS (DVT) Result Date: 07/29/2023  Lower Venous DVT Study Patient Name:  Todd [name]  Date of Exam:   07/28/2023 Medical Rec #: [id number]           Accession #:    [id number] Date of Birth: [birth year]          Patient Gender: M Patient Age:   74 years Exam Location:  [facility] Procedure:      VAS US  LOWER EXTREMITY VENOUS (DVT) Referring Phys: [facility] GOEL --------------------------------------------------------------------------------  Indications: Swelling, Edema, Pain, and Blisters on left leg and foot.  Risk Factors: Surgery Right femoral to above knee bypass graft. Performing Technologist: Ricka Holland RDMS, RVT  Examination Guidelines: A complete evaluation  includes B-mode imaging, spectral Doppler, color Doppler, and power Doppler as needed of all accessible portions of each vessel. Bilateral testing is considered an integral part of a complete examination. Limited examinations for reoccurring indications may be performed as noted. The reflux portion of the exam is performed with the patient in reverse Trendelenburg.  +---------+---------------+---------+-----------+----------+--------------+ RIGHT    CompressibilityPhasicitySpontaneityPropertiesThrombus Aging +---------+---------------+---------+-----------+----------+--------------+ CFV      Full           Yes      Yes                                 +---------+---------------+---------+-----------+----------+--------------+ SFJ      Full                                                        +---------+---------------+---------+-----------+----------+--------------+ FV Prox  Full                                                        +---------+---------------+---------+-----------+----------+--------------+ FV Mid   Full                                                        +---------+---------------+---------+-----------+----------+--------------+ FV DistalFull                                                        +---------+---------------+---------+-----------+----------+--------------+ PFV      Full                                                        +---------+---------------+---------+-----------+----------+--------------+  POP      Full           Yes      Yes                                 +---------+---------------+---------+-----------+----------+--------------+ PTV      Full                                                        +---------+---------------+---------+-----------+----------+--------------+ PERO     Full                                                         +---------+---------------+---------+-----------+----------+--------------+ Patent with pulsatile flow  +---------+---------------+---------+-----------+----------+--------------+ LEFT     CompressibilityPhasicitySpontaneityPropertiesThrombus Aging +---------+---------------+---------+-----------+----------+--------------+ CFV      Full           Yes      Yes                                 +---------+---------------+---------+-----------+----------+--------------+ SFJ      Full                                                        +---------+---------------+---------+-----------+----------+--------------+ FV Prox  Full                                                        +---------+---------------+---------+-----------+----------+--------------+ FV Mid   Full                                                        +---------+---------------+---------+-----------+----------+--------------+ FV DistalFull                                                        +---------+---------------+---------+-----------+----------+--------------+ PFV      Full                                                        +---------+---------------+---------+-----------+----------+--------------+ POP      Full           Yes      Yes                                 +---------+---------------+---------+-----------+----------+--------------+  PTV      Full                                                        +---------+---------------+---------+-----------+----------+--------------+ PERO     Full                                                        +---------+---------------+---------+-----------+----------+--------------+ Patent with pulsatile flow.    Summary: BILATERAL: - No evidence of deep vein thrombosis seen in the lower extremities, bilaterally. -No evidence of popliteal cyst, bilaterally.   *See table(s) above for measurements and observations. Electronically  signed by Penne Colorado MD on 07/29/2023 at 4:06:09 PM.    Final    US  Abdomen Limited RUQ (LIVER/GB) Result Date: 07/28/2023 CLINICAL DATA:  Anasarca and elevated INR EXAM: ULTRASOUND ABDOMEN LIMITED RIGHT UPPER QUADRANT COMPARISON:  MRI abdomen dated 05/05/2023 FINDINGS: Gallbladder: No gallstones or wall thickening visualized. No sonographic Murphy sign noted by sonographer. Common bile duct: Diameter: 6 mm Liver: Nodular hepatic contour. Portal vein is patent on color Doppler imaging with normal direction of blood flow towards the liver. Other: Small volume ascites. Partially imaged right pleural effusion. IMPRESSION: 1. Nodular hepatic contour, which can be seen in the setting of cirrhosis. 2. Small volume ascites. 3. Partially imaged right pleural effusion. Electronically Signed   By: Limin  Xu M.D.   On: 07/28/2023 14:06   DG Chest 1 View Result Date: 07/27/2023 CLINICAL DATA:  Shortness of breath. EXAM: CHEST  1 VIEW COMPARISON:  10/17/2022. FINDINGS: Low lung volume. There is mild-to-moderate diffuse pulmonary vascular congestion and small left and moderate right pleural effusion. No pneumothorax. Mildly enlarged cardio-mediastinal silhouette. No acute osseous abnormalities. The soft tissues are within normal limits. IMPRESSION: Findings favor congestive heart failure/pulmonary edema, as described above. Electronically Signed   By: Ree Molt M.D.   On: 07/27/2023 15:45   VAS US  LOWER EXTREMITY BYPASS GRAFT DUPLEX Result Date: 07/24/2023 LOWER EXTREMITY ARTERIAL DUPLEX STUDY Patient Name:  Todd [name]  Date of Exam:   07/20/2023 Medical Rec #: [id number]           Accession #:    [id number] Date of Birth: Aug 03, 1950          Patient Gender: M Patient Age:   22 years Exam Location:  [facility] Procedure:      VAS US  LOWER EXTREMITY BYPASS GRAFT DUPLEX Referring Phys: GAILE NEW --------------------------------------------------------------------------------  Indications:  Right femoral to above knee bypass evaluation. High Risk Factors: Hypertension, current smoker.  Current ABI: Right ABI 1.38 Left [place] Performing Technologist: Devere Dark RVT  Examination Guidelines: A complete evaluation includes B-mode imaging, spectral Doppler, color Doppler, and power Doppler as needed of all accessible portions of each vessel. Bilateral testing is considered an integral part of a complete examination. Limited examinations for reoccurring indications may be performed as noted.  +-----------+--------+-----+--------+----------+--------+ RIGHT      PSV cm/sRatioStenosisWaveform  Comments +-----------+--------+-----+--------+----------+--------+ POP Prox   62                   triphasic          +-----------+--------+-----+--------+----------+--------+  POP Mid    67                   triphasic          +-----------+--------+-----+--------+----------+--------+ POP Distal 76                   triphasic          +-----------+--------+-----+--------+----------+--------+ ATA Distal 10                   monophasic         +-----------+--------+-----+--------+----------+--------+ PTA Distal 52                   biphasic           +-----------+--------+-----+--------+----------+--------+ PERO Distal                               NV       +-----------+--------+-----+--------+----------+--------+  Right Graft #1: Femoral to AK popliteal bypass +------------------+--------+--------+---------+--------+                   PSV cm/sStenosisWaveform Comments +------------------+--------+--------+---------+--------+ Inflow            54              triphasic         +------------------+--------+--------+---------+--------+ Prox Anastomosis  53              triphasic         +------------------+--------+--------+---------+--------+ Proximal Graft    43              triphasic         +------------------+--------+--------+---------+--------+ Mid  Graft         51              triphasic         +------------------+--------+--------+---------+--------+ Distal Graft      44              triphasic         +------------------+--------+--------+---------+--------+ Distal Anastomosis43              triphasic         +------------------+--------+--------+---------+--------+ Outflow           50              triphasic         +------------------+--------+--------+---------+--------+   Summary: Right: Patent femoral to above knee bypass graft.  See table(s) above for measurements and observations. Electronically signed by Gaile New MD on 07/24/2023 at 5:28:12 PM.    Final    VAS US  IVC/ILIAC (VENOUS ONLY) Result Date: 07/24/2023 IVC/ILIAC STUDY Patient Name:  Todd [name]  Date of Exam:   07/20/2023 Medical Rec #: [id number]           Accession #:    [id number] Date of Birth: Jan 07, 1951          Patient Gender: M Patient Age:   20 years Exam Location:  [facility] Procedure:      VAS US  IVC/ILIAC (VENOUS ONLY) Referring Phys: DONNICE SENDER --------------------------------------------------------------------------------  Indications: Abdominal Swelling Risk Factors: Hypertension. Other Factors: Pancreatic Insufficiency. Limitations: Air/bowel gas and patient discomfort.  Performing Technologist: Camellia Bolder RVT  Examination Guidelines: A complete evaluation includes B-mode imaging, spectral Doppler, color Doppler, and power Doppler as needed of all accessible portions of each vessel. Bilateral testing is considered an integral  part of a complete examination. Limited examinations for reoccurring indications may be performed as noted.  IVC/Iliac Findings: +----------+------+--------+--------+    IVC    PatentThrombusComments +----------+------+--------+--------+ IVC Prox  patent                 +----------+------+--------+--------+ IVC Mid   patent                 +----------+------+--------+--------+  IVC Distalpatent                 +----------+------+--------+--------+   Summary: IVC/Iliac: Limited exam per provider. -The visualized areas of the inferior vena cava appear patent with no significant obstruction. - The portal vein appears patent with hepatopetal flow. - The mid hepatic vein is patent with pulsitile flow.  *See table(s) above for measurements and observations.  Electronically signed by Gaile New MD on 07/24/2023 at 5:27:51 PM.    Final    VAS US  ABI WITH/WO TBI Result Date: 07/20/2023  LOWER EXTREMITY DOPPLER STUDY Patient Name:  Todd [name]  Date of Exam:   07/20/2023 Medical Rec #: [id number]           Accession #:    [id number] Date of Birth: 1950/12/27          Patient Gender: M Patient Age:   60 years Exam Location:  [facility] Procedure:      VAS US  ABI WITH/WO TBI Referring Phys: GAILE NEW --------------------------------------------------------------------------------  Indications: Peripheral artery disease. High Risk Factors: Hypertension, current smoker.  Vascular Interventions: 03/15/2021: Right external iliac artery stent.                          03/12/2021: Right femoral to above-knee popliteal bypass                         graft. Right iliofemoral endarterectomy with vein patch                         angioplasty. Right common and external iliac stent.                          03/02/2021: Drug-coated balloon angioplasty and stenting                         of the right external iliac artery. Left external iliac                         artery stent.                          03/08/2019: Bilateral external iliac stenting. Limitations: Today's exam was limited due to involuntary patient movement. Performing Technologist: Devere Dark RVT  Examination Guidelines: A complete evaluation includes at minimum, Doppler waveform signals and systolic blood pressure reading at the level of bilateral brachial, anterior tibial, and posterior tibial arteries,  when vessel segments are accessible. Bilateral testing is considered an integral part of a complete examination. Photoelectric Plethysmograph (PPG) waveforms and toe systolic pressure readings are included as required and additional duplex testing as needed. Limited examinations for reoccurring indications may be performed as noted.  ABI Findings: +---------+------------------+-----+--------+----------------+ Right    Rt Pressure (mmHg)IndexWaveformComment          +---------+------------------+-----+--------+----------------+ Brachial 101                                             +---------+------------------+-----+--------+----------------+  PTA      156               1.38 biphasic                 +---------+------------------+-----+--------+----------------+ DP       143               1.27 biphasic                 +---------+------------------+-----+--------+----------------+ Great Toe                               Unable to obtain +---------+------------------+-----+--------+----------------+ +---------+------------------+-----+----------+----------------+ Left     Lt Pressure (mmHg)IndexWaveform  Comment          +---------+------------------+-----+----------+----------------+ Brachial 113                                               +---------+------------------+-----+----------+----------------+ PTA      254               2.25 monophasic                 +---------+------------------+-----+----------+----------------+ DP       254               2.25 monophasic                 +---------+------------------+-----+----------+----------------+ Great Toe                                 unable to obtain +---------+------------------+-----+----------+----------------+ +-------+-----------+------------+------------+------------+ ABI/TBIToday's ABIToday's TBI Previous ABIPrevious TBI +-------+-----------+------------+------------+------------+ Right   1.38       not obtained1.01        0.55         +-------+-----------+------------+------------+------------+ Left   [place]         not obtained0.82        0.42         +-------+-----------+------------+------------+------------+   Summary: Right: Resting right ankle-brachial index is within normal range. Left: Resting left ankle-brachial index indicates noncompressible left lower extremity arteries. Unable to obtain toe-brachial index due to movement and low amplitude of PPG waveforms. *See table(s) above for measurements and observations.  Electronically signed by Lonni Gaskins MD on 07/20/2023 at 3:09:54 PM.    Final     Microbiology: Results for orders placed or performed during the hospital encounter of 10/17/22  Blood Culture (routine x 2)     Status: None   Collection Time: 10/17/22 12:56 PM   Specimen: BLOOD RIGHT FOREARM  Result Value Ref Range Status   Specimen Description   Final    BLOOD RIGHT FOREARM BOTTLES DRAWN AEROBIC AND ANAEROBIC   Special Requests Blood Culture adequate volume  Final   Culture   Final    NO GROWTH 5 DAYS Performed at [facility], [address] [place], [place] [address]    Report Status 10/22/2022 FINAL  Final  Blood Culture (routine x 2)     Status: None   Collection Time: 10/17/22  1:06 PM   Specimen: Right Antecubital; Blood  Result Value Ref Range Status   Specimen Description   Final    RIGHT ANTECUBITAL BOTTLES DRAWN AEROBIC AND ANAEROBIC   Special Requests   Final  Blood Culture results may not be optimal due to an excessive volume of blood received in culture bottles   Culture   Final    NO GROWTH 5 DAYS Performed at [facility], [address] [place], [place] [address]    Report Status 10/22/2022 FINAL  Final  Resp panel by RT-PCR (RSV, Flu A&B, Covid) Anterior Nasal Swab     Status: None   Collection Time: 10/17/22  5:54 PM   Specimen: Anterior Nasal Swab  Result Value Ref Range Status   SARS Coronavirus 2 by RT PCR  NEGATIVE NEGATIVE Final    Comment: (NOTE) SARS-CoV-2 target nucleic acids are NOT DETECTED.  The SARS-CoV-2 RNA is generally detectable in upper respiratory specimens during the acute phase of infection. The lowest concentration of SARS-CoV-2 viral copies this assay can detect is 138 copies/mL. A negative result does not preclude SARS-Cov-2 infection and should not be used as the sole basis for treatment or other patient management decisions. A negative result may occur with  improper specimen collection/handling, submission of specimen other than nasopharyngeal swab, presence of viral mutation(s) within the areas targeted by this assay, and inadequate number of viral copies(<138 copies/mL). A negative result must be combined with clinical observations, patient history, and epidemiological information. The expected result is Negative.  Fact Sheet for Patients:  bloggercourse.com  Fact Sheet for Healthcare Providers:  seriousbroker.it  This test is no t yet approved or cleared by the [place]  FDA and  has been authorized for detection and/or diagnosis of SARS-CoV-2 by FDA under an Emergency Use Authorization (EUA). This EUA will remain  in effect (meaning this test can be used) for the duration of the COVID-19 declaration under Section 564(b)(1) of the Act, 21 U.S.C.section 360bbb-3(b)(1), unless the authorization is terminated  or revoked sooner.       Influenza A by PCR NEGATIVE NEGATIVE Final   Influenza B by PCR NEGATIVE NEGATIVE Final    Comment: (NOTE) The Xpert Xpress SARS-CoV-2/FLU/RSV plus assay is intended as an aid in the diagnosis of influenza from Nasopharyngeal swab specimens and should not be used as a sole basis for treatment. Nasal washings and aspirates are unacceptable for Xpert Xpress SARS-CoV-2/FLU/RSV testing.  Fact Sheet for Patients: bloggercourse.com  Fact Sheet for  Healthcare Providers: seriousbroker.it  This test is not yet approved or cleared by the [place]  FDA and has been authorized for detection and/or diagnosis of SARS-CoV-2 by FDA under an Emergency Use Authorization (EUA). This EUA will remain in effect (meaning this test can be used) for the duration of the COVID-19 declaration under Section 564(b)(1) of the Act, 21 U.S.C. section 360bbb-3(b)(1), unless the authorization is terminated or revoked.     Resp Syncytial Virus by PCR NEGATIVE NEGATIVE Final    Comment: (NOTE) Fact Sheet for Patients: bloggercourse.com  Fact Sheet for Healthcare Providers: seriousbroker.it  This test is not yet approved or cleared by the [place]  FDA and has been authorized for detection and/or diagnosis of SARS-CoV-2 by FDA under an Emergency Use Authorization (EUA). This EUA will remain in effect (meaning this test can be used) for the duration of the COVID-19 declaration under Section 564(b)(1) of the Act, 21 U.S.C. section 360bbb-3(b)(1), unless the authorization is terminated or revoked.  Performed at [facility], [address], [place], [place] [address]     Labs: CBC: Recent Labs  Lab 07/27/23 1315 07/27/23 1951 07/28/23 0449  WBC 5.8 4.9 4.6  HGB 11.5* 11.4* 11.3*  HCT 38.4* 37.7* 37.5*  MCV 92.8 92.2 92.8  PLT 174 176 160   Basic Metabolic Panel: Recent Labs  Lab 07/28/23 0449 07/29/23 0731 07/30/23 0735 07/31/23 0631 08/01/23 0350 08/01/23 1641 08/02/23 0331  NA 139 139 136 137 135  --  135  K 3.8 3.3* 2.9* 3.7 2.8* 3.4* 4.0  CL 101 98 93* 92* 91*  --  95*  CO2 23 26 31 31 31   --  28  GLUCOSE 87 91 104* 86 101*  --  80  BUN 17 20 20 18 19   --  18  CREATININE 1.37* 1.88* 1.61* 1.48* 1.57*  --  1.53*  CALCIUM  8.0* 8.2* 8.1* 8.1* 7.7*  --  7.9*  MG 2.1  --   --   --   --   --   --    Liver Function Tests: Recent Labs  Lab  07/27/23 1315 07/29/23 0731  AST 20 19  ALT 11 9  ALKPHOS 61 59  BILITOT 0.6 0.6  PROT 6.6 6.4*  ALBUMIN  3.1* 2.9*    Discharge time spent: 35 minutes.  Signed: Elgin Lam, MD [facility] 08/02/2023

## 2023-08-02 NOTE — Progress Notes (Signed)
 DISCHARGE NOTE HOME Todd [name] to be discharged Home per MD order. Discussed prescriptions and follow up appointments with the patient. Prescriptions given to patient; medication list explained in detail. Patient verbalized understanding.  Skin clean, dry and intact without evidence of skin break down, no evidence of skin tears noted. IV catheter discontinued intact. Site without signs and symptoms of complications. Dressing and pressure applied. Pt denies pain at the site currently. No complaints noted.  Patient free of lines, drains, and wounds other than noted on LDA  Bilateral foot blisters and wounds  An After Visit Summary (AVS) was printed and given to the patient. Patient escorted via wheelchair, and discharged home via private auto.  [name], RN

## 2023-08-07 DIAGNOSIS — R600 Localized edema: Secondary | ICD-10-CM | POA: Diagnosis not present

## 2023-08-07 DIAGNOSIS — L03119 Cellulitis of unspecified part of limb: Secondary | ICD-10-CM | POA: Diagnosis not present

## 2023-08-07 DIAGNOSIS — I10 Essential (primary) hypertension: Secondary | ICD-10-CM | POA: Diagnosis not present

## 2023-08-07 DIAGNOSIS — Z0001 Encounter for general adult medical examination with abnormal findings: Secondary | ICD-10-CM | POA: Diagnosis not present

## 2023-08-07 DIAGNOSIS — Z6820 Body mass index (BMI) 20.0-20.9, adult: Secondary | ICD-10-CM | POA: Diagnosis not present

## 2023-08-07 DIAGNOSIS — I4891 Unspecified atrial fibrillation: Secondary | ICD-10-CM | POA: Diagnosis not present

## 2023-08-07 DIAGNOSIS — K860 Alcohol-induced chronic pancreatitis: Secondary | ICD-10-CM | POA: Diagnosis not present

## 2023-08-08 ENCOUNTER — Inpatient Hospital Stay (HOSPITAL_COMMUNITY): Payer: PPO

## 2023-08-08 ENCOUNTER — Other Ambulatory Visit: Payer: Self-pay

## 2023-08-08 ENCOUNTER — Emergency Department (HOSPITAL_COMMUNITY): Payer: PPO

## 2023-08-08 ENCOUNTER — Other Ambulatory Visit (HOSPITAL_COMMUNITY): Payer: PPO

## 2023-08-08 ENCOUNTER — Inpatient Hospital Stay (HOSPITAL_COMMUNITY)
Admission: EM | Admit: 2023-08-08 | Discharge: 2023-08-26 | DRG: 870 | Disposition: E | Payer: PPO | Attending: Internal Medicine | Admitting: Internal Medicine

## 2023-08-08 ENCOUNTER — Encounter (HOSPITAL_COMMUNITY): Payer: Self-pay | Admitting: *Deleted

## 2023-08-08 DIAGNOSIS — R569 Unspecified convulsions: Secondary | ICD-10-CM | POA: Diagnosis present

## 2023-08-08 DIAGNOSIS — I469 Cardiac arrest, cause unspecified: Secondary | ICD-10-CM | POA: Diagnosis present

## 2023-08-08 DIAGNOSIS — I499 Cardiac arrhythmia, unspecified: Secondary | ICD-10-CM | POA: Diagnosis not present

## 2023-08-08 DIAGNOSIS — J440 Chronic obstructive pulmonary disease with acute lower respiratory infection: Secondary | ICD-10-CM | POA: Diagnosis not present

## 2023-08-08 DIAGNOSIS — R54 Age-related physical debility: Secondary | ICD-10-CM | POA: Diagnosis present

## 2023-08-08 DIAGNOSIS — G40409 Other generalized epilepsy and epileptic syndromes, not intractable, without status epilepticus: Secondary | ICD-10-CM | POA: Diagnosis not present

## 2023-08-08 DIAGNOSIS — G9389 Other specified disorders of brain: Secondary | ICD-10-CM | POA: Diagnosis present

## 2023-08-08 DIAGNOSIS — G9341 Metabolic encephalopathy: Secondary | ICD-10-CM | POA: Diagnosis present

## 2023-08-08 DIAGNOSIS — I6521 Occlusion and stenosis of right carotid artery: Secondary | ICD-10-CM | POA: Diagnosis not present

## 2023-08-08 DIAGNOSIS — J80 Acute respiratory distress syndrome: Secondary | ICD-10-CM | POA: Diagnosis not present

## 2023-08-08 DIAGNOSIS — A4189 Other specified sepsis: Secondary | ICD-10-CM | POA: Diagnosis not present

## 2023-08-08 DIAGNOSIS — E785 Hyperlipidemia, unspecified: Secondary | ICD-10-CM | POA: Diagnosis present

## 2023-08-08 DIAGNOSIS — Z8249 Family history of ischemic heart disease and other diseases of the circulatory system: Secondary | ICD-10-CM

## 2023-08-08 DIAGNOSIS — K219 Gastro-esophageal reflux disease without esophagitis: Secondary | ICD-10-CM | POA: Diagnosis present

## 2023-08-08 DIAGNOSIS — K7201 Acute and subacute hepatic failure with coma: Secondary | ICD-10-CM | POA: Diagnosis not present

## 2023-08-08 DIAGNOSIS — Z681 Body mass index (BMI) 19 or less, adult: Secondary | ICD-10-CM

## 2023-08-08 DIAGNOSIS — R404 Transient alteration of awareness: Secondary | ICD-10-CM | POA: Diagnosis not present

## 2023-08-08 DIAGNOSIS — I4821 Permanent atrial fibrillation: Secondary | ICD-10-CM | POA: Diagnosis present

## 2023-08-08 DIAGNOSIS — K648 Other hemorrhoids: Secondary | ICD-10-CM | POA: Diagnosis present

## 2023-08-08 DIAGNOSIS — Z4682 Encounter for fitting and adjustment of non-vascular catheter: Secondary | ICD-10-CM | POA: Diagnosis not present

## 2023-08-08 DIAGNOSIS — G931 Anoxic brain damage, not elsewhere classified: Secondary | ICD-10-CM | POA: Diagnosis present

## 2023-08-08 DIAGNOSIS — Z66 Do not resuscitate: Secondary | ICD-10-CM | POA: Diagnosis not present

## 2023-08-08 DIAGNOSIS — J1282 Pneumonia due to coronavirus disease 2019: Secondary | ICD-10-CM | POA: Diagnosis not present

## 2023-08-08 DIAGNOSIS — K921 Melena: Secondary | ICD-10-CM | POA: Diagnosis not present

## 2023-08-08 DIAGNOSIS — E039 Hypothyroidism, unspecified: Secondary | ICD-10-CM | POA: Diagnosis present

## 2023-08-08 DIAGNOSIS — R64 Cachexia: Secondary | ICD-10-CM | POA: Diagnosis present

## 2023-08-08 DIAGNOSIS — K7460 Unspecified cirrhosis of liver: Secondary | ICD-10-CM | POA: Diagnosis not present

## 2023-08-08 DIAGNOSIS — I739 Peripheral vascular disease, unspecified: Secondary | ICD-10-CM | POA: Diagnosis not present

## 2023-08-08 DIAGNOSIS — F17210 Nicotine dependence, cigarettes, uncomplicated: Secondary | ICD-10-CM | POA: Diagnosis present

## 2023-08-08 DIAGNOSIS — I468 Cardiac arrest due to other underlying condition: Secondary | ICD-10-CM | POA: Diagnosis not present

## 2023-08-08 DIAGNOSIS — K7031 Alcoholic cirrhosis of liver with ascites: Secondary | ICD-10-CM | POA: Diagnosis not present

## 2023-08-08 DIAGNOSIS — J9601 Acute respiratory failure with hypoxia: Secondary | ICD-10-CM | POA: Diagnosis not present

## 2023-08-08 DIAGNOSIS — Z7902 Long term (current) use of antithrombotics/antiplatelets: Secondary | ICD-10-CM

## 2023-08-08 DIAGNOSIS — J810 Acute pulmonary edema: Secondary | ICD-10-CM

## 2023-08-08 DIAGNOSIS — E43 Unspecified severe protein-calorie malnutrition: Secondary | ICD-10-CM | POA: Insufficient documentation

## 2023-08-08 DIAGNOSIS — Z79890 Hormone replacement therapy (postmenopausal): Secondary | ICD-10-CM

## 2023-08-08 DIAGNOSIS — I110 Hypertensive heart disease with heart failure: Secondary | ICD-10-CM | POA: Diagnosis not present

## 2023-08-08 DIAGNOSIS — I700 Atherosclerosis of aorta: Secondary | ICD-10-CM | POA: Diagnosis not present

## 2023-08-08 DIAGNOSIS — Z515 Encounter for palliative care: Secondary | ICD-10-CM

## 2023-08-08 DIAGNOSIS — R627 Adult failure to thrive: Secondary | ICD-10-CM | POA: Diagnosis present

## 2023-08-08 DIAGNOSIS — G253 Myoclonus: Secondary | ICD-10-CM | POA: Diagnosis not present

## 2023-08-08 DIAGNOSIS — E872 Acidosis, unspecified: Secondary | ICD-10-CM | POA: Diagnosis not present

## 2023-08-08 DIAGNOSIS — I272 Pulmonary hypertension, unspecified: Secondary | ICD-10-CM | POA: Diagnosis not present

## 2023-08-08 DIAGNOSIS — I213 ST elevation (STEMI) myocardial infarction of unspecified site: Secondary | ICD-10-CM | POA: Diagnosis not present

## 2023-08-08 DIAGNOSIS — K8689 Other specified diseases of pancreas: Secondary | ICD-10-CM | POA: Diagnosis not present

## 2023-08-08 DIAGNOSIS — D649 Anemia, unspecified: Secondary | ICD-10-CM | POA: Diagnosis present

## 2023-08-08 DIAGNOSIS — J9602 Acute respiratory failure with hypercapnia: Secondary | ICD-10-CM

## 2023-08-08 DIAGNOSIS — L89151 Pressure ulcer of sacral region, stage 1: Secondary | ICD-10-CM | POA: Diagnosis present

## 2023-08-08 DIAGNOSIS — Z7982 Long term (current) use of aspirin: Secondary | ICD-10-CM

## 2023-08-08 DIAGNOSIS — S2241XA Multiple fractures of ribs, right side, initial encounter for closed fracture: Secondary | ICD-10-CM | POA: Diagnosis not present

## 2023-08-08 DIAGNOSIS — Z95820 Peripheral vascular angioplasty status with implants and grafts: Secondary | ICD-10-CM

## 2023-08-08 DIAGNOSIS — R188 Other ascites: Secondary | ICD-10-CM | POA: Diagnosis not present

## 2023-08-08 DIAGNOSIS — D696 Thrombocytopenia, unspecified: Secondary | ICD-10-CM | POA: Diagnosis present

## 2023-08-08 DIAGNOSIS — N179 Acute kidney failure, unspecified: Secondary | ICD-10-CM | POA: Diagnosis present

## 2023-08-08 DIAGNOSIS — U071 COVID-19: Secondary | ICD-10-CM | POA: Diagnosis not present

## 2023-08-08 DIAGNOSIS — R0989 Other specified symptoms and signs involving the circulatory and respiratory systems: Secondary | ICD-10-CM | POA: Diagnosis not present

## 2023-08-08 DIAGNOSIS — I4891 Unspecified atrial fibrillation: Secondary | ICD-10-CM | POA: Diagnosis not present

## 2023-08-08 DIAGNOSIS — I509 Heart failure, unspecified: Principal | ICD-10-CM

## 2023-08-08 DIAGNOSIS — Z8673 Personal history of transient ischemic attack (TIA), and cerebral infarction without residual deficits: Secondary | ICD-10-CM

## 2023-08-08 DIAGNOSIS — I2510 Atherosclerotic heart disease of native coronary artery without angina pectoris: Secondary | ICD-10-CM | POA: Diagnosis not present

## 2023-08-08 DIAGNOSIS — F10139 Alcohol abuse with withdrawal, unspecified: Secondary | ICD-10-CM | POA: Diagnosis not present

## 2023-08-08 DIAGNOSIS — J151 Pneumonia due to Pseudomonas: Secondary | ICD-10-CM | POA: Diagnosis not present

## 2023-08-08 DIAGNOSIS — R230 Cyanosis: Secondary | ICD-10-CM | POA: Diagnosis not present

## 2023-08-08 DIAGNOSIS — R55 Syncope and collapse: Secondary | ICD-10-CM | POA: Diagnosis not present

## 2023-08-08 DIAGNOSIS — I6523 Occlusion and stenosis of bilateral carotid arteries: Secondary | ICD-10-CM | POA: Diagnosis not present

## 2023-08-08 DIAGNOSIS — R7401 Elevation of levels of liver transaminase levels: Secondary | ICD-10-CM | POA: Diagnosis not present

## 2023-08-08 DIAGNOSIS — H9190 Unspecified hearing loss, unspecified ear: Secondary | ICD-10-CM | POA: Diagnosis present

## 2023-08-08 DIAGNOSIS — R6520 Severe sepsis without septic shock: Secondary | ICD-10-CM | POA: Diagnosis present

## 2023-08-08 DIAGNOSIS — R0689 Other abnormalities of breathing: Secondary | ICD-10-CM | POA: Diagnosis not present

## 2023-08-08 DIAGNOSIS — I3139 Other pericardial effusion (noninflammatory): Secondary | ICD-10-CM | POA: Diagnosis present

## 2023-08-08 DIAGNOSIS — I517 Cardiomegaly: Secondary | ICD-10-CM | POA: Diagnosis not present

## 2023-08-08 DIAGNOSIS — R140 Abdominal distension (gaseous): Secondary | ICD-10-CM | POA: Diagnosis not present

## 2023-08-08 DIAGNOSIS — J90 Pleural effusion, not elsewhere classified: Secondary | ICD-10-CM | POA: Diagnosis not present

## 2023-08-08 DIAGNOSIS — R600 Localized edema: Secondary | ICD-10-CM | POA: Diagnosis present

## 2023-08-08 DIAGNOSIS — I50812 Chronic right heart failure: Secondary | ICD-10-CM | POA: Diagnosis present

## 2023-08-08 DIAGNOSIS — I6782 Cerebral ischemia: Secondary | ICD-10-CM | POA: Diagnosis not present

## 2023-08-08 DIAGNOSIS — R918 Other nonspecific abnormal finding of lung field: Secondary | ICD-10-CM | POA: Diagnosis not present

## 2023-08-08 DIAGNOSIS — G319 Degenerative disease of nervous system, unspecified: Secondary | ICD-10-CM | POA: Diagnosis not present

## 2023-08-08 DIAGNOSIS — I482 Chronic atrial fibrillation, unspecified: Secondary | ICD-10-CM | POA: Diagnosis not present

## 2023-08-08 DIAGNOSIS — Z79899 Other long term (current) drug therapy: Secondary | ICD-10-CM

## 2023-08-08 DIAGNOSIS — Z452 Encounter for adjustment and management of vascular access device: Secondary | ICD-10-CM | POA: Diagnosis not present

## 2023-08-08 LAB — RAPID URINE DRUG SCREEN, HOSP PERFORMED
Amphetamines: NOT DETECTED
Barbiturates: NOT DETECTED
Benzodiazepines: NOT DETECTED
Cocaine: NOT DETECTED
Opiates: NOT DETECTED
Tetrahydrocannabinol: NOT DETECTED

## 2023-08-08 LAB — LACTIC ACID, PLASMA
Lactic Acid, Venous: >9 mmol/L (ref 0.5–1.9)
Lactic Acid, Venous: 8.4 mmol/L (ref 0.5–1.9)
Lactic Acid, Venous: 8.3 mmol/L (ref 0.5–1.9)
Lactic Acid, Venous: 3.5 mmol/L (ref 0.5–1.9)

## 2023-08-08 LAB — BLOOD GAS, ARTERIAL
Acid-base deficit: 11.4 mmol/L — ABNORMAL HIGH (ref 0.0–2.0)
Acid-base deficit: 6.4 mmol/L — ABNORMAL HIGH (ref 0.0–2.0)
Bicarbonate: 19.7 mmol/L — ABNORMAL LOW (ref 20.0–28.0)
Bicarbonate: 21 mmol/L (ref 20.0–28.0)
O2 Saturation: 96.1 %
O2 Saturation: 84.5 %
Patient temperature: 36.3
Patient temperature: 36.3
pCO2 arterial: 66 mm[Hg] (ref 32–48)
pCO2 arterial: 48 mm[Hg] (ref 32–48)
pH, Arterial: 7.08 — CL (ref 7.35–7.45)
pH, Arterial: 7.25 — ABNORMAL LOW (ref 7.35–7.45)
pO2, Arterial: 97 mm[Hg] (ref 83–108)
pO2, Arterial: 54 mm[Hg] — ABNORMAL LOW (ref 83–108)

## 2023-08-08 LAB — POCT I-STAT 7, (LYTES, BLD GAS, ICA,H+H)
Acid-base deficit: 2 mmol/L (ref 0.0–2.0)
Bicarbonate: 25.7 mmol/L (ref 20.0–28.0)
Calcium, Ion: 0.98 mmol/L — ABNORMAL LOW (ref 1.15–1.40)
HCT: 35 % — ABNORMAL LOW (ref 39.0–52.0)
Hemoglobin: 11.9 g/dL — ABNORMAL LOW (ref 13.0–17.0)
O2 Saturation: 83 %
Potassium: 5.2 mmol/L — ABNORMAL HIGH (ref 3.5–5.1)
Sodium: 137 mmol/L (ref 135–145)
TCO2: 27 mmol/L (ref 22–32)
pCO2 arterial: 55.4 mm[Hg] — ABNORMAL HIGH (ref 32–48)
pH, Arterial: 7.275 — ABNORMAL LOW (ref 7.35–7.45)
pO2, Arterial: 55 mm[Hg] — ABNORMAL LOW (ref 83–108)

## 2023-08-08 LAB — COMPREHENSIVE METABOLIC PANEL
ALT: 95 U/L — ABNORMAL HIGH (ref 0–44)
AST: 271 U/L — ABNORMAL HIGH (ref 15–41)
Albumin: 2.7 g/dL — ABNORMAL LOW (ref 3.5–5.0)
Alkaline Phosphatase: 65 U/L (ref 38–126)
Anion gap: 18 — ABNORMAL HIGH (ref 5–15)
BUN: 24 mg/dL — ABNORMAL HIGH (ref 8–23)
CO2: 22 mmol/L (ref 22–32)
Calcium: 7.6 mg/dL — ABNORMAL LOW (ref 8.9–10.3)
Chloride: 96 mmol/L — ABNORMAL LOW (ref 98–111)
Creatinine, Ser: 1.5 mg/dL — ABNORMAL HIGH (ref 0.61–1.24)
GFR, Estimated: 49 mL/min — ABNORMAL LOW (ref >60)
Glucose, Bld: 141 mg/dL — ABNORMAL HIGH (ref 70–99)
Potassium: 4.4 mmol/L (ref 3.5–5.1)
Sodium: 136 mmol/L (ref 135–145)
Total Bilirubin: 0.8 mg/dL (ref 0.0–1.2)
Total Protein: 6.3 g/dL — ABNORMAL LOW (ref 6.5–8.1)

## 2023-08-08 LAB — CBC
HCT: 38.5 % — ABNORMAL LOW (ref 39.0–52.0)
Hemoglobin: 11.1 g/dL — ABNORMAL LOW (ref 13.0–17.0)
MCH: 27.5 pg (ref 26.0–34.0)
MCHC: 28.8 g/dL — ABNORMAL LOW (ref 30.0–36.0)
MCV: 95.5 fL (ref 80.0–100.0)
Platelets: 82 10*3/uL — ABNORMAL LOW (ref 150–400)
RBC: 4.03 MIL/uL — ABNORMAL LOW (ref 4.22–5.81)
RDW: 19.3 % — ABNORMAL HIGH (ref 11.5–15.5)
WBC: 4.9 10*3/uL (ref 4.0–10.5)
nRBC: 3.9 % — ABNORMAL HIGH (ref 0.0–0.2)

## 2023-08-08 LAB — GLUCOSE, CAPILLARY
Glucose-Capillary: 117 mg/dL — ABNORMAL HIGH (ref 70–99)
Glucose-Capillary: 27 mg/dL — CL (ref 70–99)
Glucose-Capillary: 115 mg/dL — ABNORMAL HIGH (ref 70–99)

## 2023-08-08 LAB — AMMONIA: Ammonia: 40 umol/L — ABNORMAL HIGH (ref 9–35)

## 2023-08-08 LAB — URINALYSIS, ROUTINE W REFLEX MICROSCOPIC
Bilirubin Urine: NEGATIVE
Glucose, UA: >=500 mg/dL — AB
Ketones, ur: NEGATIVE mg/dL
Leukocytes,Ua: NEGATIVE
Nitrite: NEGATIVE
Protein, ur: >=300 mg/dL — AB
Specific Gravity, Urine: 1.02 (ref 1.005–1.030)
pH: 5 (ref 5.0–8.0)

## 2023-08-08 LAB — TROPONIN I (HIGH SENSITIVITY)
Troponin I (High Sensitivity): 86 ng/L — ABNORMAL HIGH (ref <18)
Troponin I (High Sensitivity): 146 ng/L (ref <18)

## 2023-08-08 LAB — COOXEMETRY PANEL
Carboxyhemoglobin: 2.3 % — ABNORMAL HIGH (ref 0.5–1.5)
Methemoglobin: <0.7 % (ref 0.0–1.5)
O2 Saturation: 72.5 %
Total hemoglobin: 10.7 g/dL — ABNORMAL LOW (ref 12.0–16.0)

## 2023-08-08 LAB — PROTIME-INR
INR: 1.5 — ABNORMAL HIGH (ref 0.8–1.2)
Prothrombin Time: 18.6 s — ABNORMAL HIGH (ref 11.4–15.2)

## 2023-08-08 LAB — I-STAT CHEM 8, ED
BUN: 24 mg/dL — ABNORMAL HIGH (ref 8–23)
Calcium, Ion: 0.8 mmol/L — CL (ref 1.15–1.40)
Chloride: 102 mmol/L (ref 98–111)
Creatinine, Ser: 1.6 mg/dL — ABNORMAL HIGH (ref 0.61–1.24)
Glucose, Bld: 130 mg/dL — ABNORMAL HIGH (ref 70–99)
HCT: 36 % — ABNORMAL LOW (ref 39.0–52.0)
Hemoglobin: 12.2 g/dL — ABNORMAL LOW (ref 13.0–17.0)
Potassium: 4.4 mmol/L (ref 3.5–5.1)
Sodium: 135 mmol/L (ref 135–145)
TCO2: 21 mmol/L — ABNORMAL LOW (ref 22–32)

## 2023-08-08 LAB — PHOSPHORUS: Phosphorus: 4.6 mg/dL (ref 2.5–4.6)

## 2023-08-08 LAB — RESP PANEL BY RT-PCR (RSV, FLU A&B, COVID)  RVPGX2
Influenza A by PCR: NEGATIVE
Influenza B by PCR: NEGATIVE
Resp Syncytial Virus by PCR: NEGATIVE
SARS Coronavirus 2 by RT PCR: POSITIVE — AB

## 2023-08-08 LAB — TYPE AND SCREEN
ABO/RH(D): O NEG
Antibody Screen: NEGATIVE

## 2023-08-08 LAB — TRIGLYCERIDES: Triglycerides: 77 mg/dL (ref <150)

## 2023-08-08 LAB — CBG MONITORING, ED: Glucose-Capillary: 81 mg/dL (ref 70–99)

## 2023-08-08 LAB — APTT: aPTT: 58 s — ABNORMAL HIGH (ref 24–36)

## 2023-08-08 LAB — MRSA NEXT GEN BY PCR, NASAL: MRSA by PCR Next Gen: NOT DETECTED

## 2023-08-08 LAB — BRAIN NATRIURETIC PEPTIDE: B Natriuretic Peptide: 1697 pg/mL — ABNORMAL HIGH (ref 0.0–100.0)

## 2023-08-08 LAB — PROCALCITONIN: Procalcitonin: <0.1 ng/mL

## 2023-08-08 LAB — MAGNESIUM: Magnesium: 2.2 mg/dL (ref 1.7–2.4)

## 2023-08-08 MED ORDER — METHYLPREDNISOLONE SODIUM SUCC 40 MG IJ SOLR
40.0000 mg | Freq: Two times a day (BID) | INTRAMUSCULAR | Status: DC
  Administered 2023-08-08 – 2023-08-09 (×2): 40 mg via INTRAVENOUS
  Filled 2023-08-08 (×3): qty 1

## 2023-08-08 MED ORDER — PROPOFOL 1000 MG/100ML IV EMUL
0.0000 ug/kg/min | INTRAVENOUS | Status: DC
  Filled 2023-08-08: qty 100

## 2023-08-08 MED ORDER — DEXTROSE 50 % IV SOLN
INTRAVENOUS | Status: AC
  Filled 2023-08-08: qty 50

## 2023-08-08 MED ORDER — FENTANYL CITRATE PF 50 MCG/ML IJ SOSY
50.0000 ug | PREFILLED_SYRINGE | Freq: Once | INTRAMUSCULAR | Status: AC
  Administered 2023-08-08: 50 ug via INTRAVENOUS
  Filled 2023-08-08: qty 1

## 2023-08-08 MED ORDER — SODIUM BICARBONATE 8.4 % IV SOLN
INTRAVENOUS | Status: AC | PRN
  Administered 2023-08-08: 50 meq via INTRAVENOUS

## 2023-08-08 MED ORDER — VANCOMYCIN HCL 750 MG/150ML IV SOLN: 750.0000 mg | INTRAVENOUS | Status: DC

## 2023-08-08 MED ORDER — MIDAZOLAM HCL 2 MG/2ML IJ SOLN
2.0000 mg | INTRAMUSCULAR | Status: DC | PRN
  Administered 2023-08-10 – 2023-08-12 (×2): 2 mg via INTRAVENOUS
  Filled 2023-08-08: qty 2

## 2023-08-08 MED ORDER — LACTATED RINGERS IV BOLUS
1000.0000 mL | Freq: Once | INTRAVENOUS | Status: AC
  Administered 2023-08-08: 1000 mL via INTRAVENOUS

## 2023-08-08 MED ORDER — FENTANYL CITRATE PF 50 MCG/ML IJ SOSY
25.0000 ug | PREFILLED_SYRINGE | INTRAMUSCULAR | Status: DC | PRN
  Administered 2023-08-08: 25 ug via INTRAVENOUS
  Administered 2023-08-09 – 2023-08-14 (×6): 50 ug via INTRAVENOUS
  Administered 2023-08-15: 100 ug via INTRAVENOUS
  Filled 2023-08-08: qty 1
  Filled 2023-08-08: qty 2
  Filled 2023-08-08 (×2): qty 1
  Filled 2023-08-08 (×2): qty 2
  Filled 2023-08-08 (×4): qty 1

## 2023-08-08 MED ORDER — DEXMEDETOMIDINE HCL IN NACL 400 MCG/100ML IV SOLN
0.0000 ug/kg/h | INTRAVENOUS | Status: DC
  Administered 2023-08-08: 0.4 ug/kg/h via INTRAVENOUS
  Administered 2023-08-09: 0.7 ug/kg/h via INTRAVENOUS
  Filled 2023-08-08: qty 100

## 2023-08-08 MED ORDER — PHENTOLAMINE MESYLATE 5 MG IJ SOLR
5.0000 mg | Freq: Once | INTRAMUSCULAR | Status: AC
  Administered 2023-08-08: 5 mg via SUBCUTANEOUS
  Filled 2023-08-08 (×2): qty 5

## 2023-08-08 MED ORDER — ALBUTEROL SULFATE (2.5 MG/3ML) 0.083% IN NEBU
INHALATION_SOLUTION | RESPIRATORY_TRACT | Status: AC
  Administered 2023-08-08: 10 mg/h via RESPIRATORY_TRACT
  Filled 2023-08-08: qty 12

## 2023-08-08 MED ORDER — VASOPRESSIN 20 UNITS/100 ML INFUSION FOR SHOCK: 0.0000 [IU]/min | INTRAVENOUS | Status: DC

## 2023-08-08 MED ORDER — FENTANYL CITRATE PF 50 MCG/ML IJ SOSY: 25.0000 ug | PREFILLED_SYRINGE | INTRAMUSCULAR | Status: DC | PRN

## 2023-08-08 MED ORDER — CHLORHEXIDINE GLUCONATE CLOTH 2 % EX PADS
6.0000 | MEDICATED_PAD | Freq: Every day | CUTANEOUS | Status: DC
  Administered 2023-08-08 – 2023-08-15 (×8): 6 via TOPICAL

## 2023-08-08 MED ORDER — EPINEPHRINE 1 MG/10ML IJ SOSY
PREFILLED_SYRINGE | INTRAMUSCULAR | Status: AC | PRN
  Administered 2023-08-08 (×2): 1 mg via INTRAVENOUS

## 2023-08-08 MED ORDER — MIDAZOLAM HCL 2 MG/2ML IJ SOLN: 1.0000 mg | INTRAMUSCULAR | Status: DC | PRN

## 2023-08-08 MED ORDER — NOREPINEPHRINE 4 MG/250ML-% IV SOLN
0.0000 ug/min | INTRAVENOUS | Status: DC
Start: 2023-08-08 — End: 2023-08-12
  Administered 2023-08-08: 17 ug/min via INTRAVENOUS
  Administered 2023-08-08: 13 ug/min via INTRAVENOUS
  Administered 2023-08-09: 5 ug/min via INTRAVENOUS
  Administered 2023-08-09: 6 ug/min via INTRAVENOUS
  Administered 2023-08-09: 5 ug/min via INTRAVENOUS
  Administered 2023-08-10: 3 ug/min via INTRAVENOUS
  Filled 2023-08-08 (×4): qty 250

## 2023-08-08 MED ORDER — ORAL CARE MOUTH RINSE: 15.0000 mL | OROMUCOSAL | Status: DC | PRN

## 2023-08-08 MED ORDER — THIAMINE HCL 100 MG/ML IJ SOLN
100.0000 mg | Freq: Every day | INTRAMUSCULAR | Status: DC
  Administered 2023-08-08 – 2023-08-15 (×8): 100 mg via INTRAVENOUS
  Filled 2023-08-08 (×8): qty 2

## 2023-08-08 MED ORDER — SODIUM CHLORIDE 0.9 % IV SOLN
2.0000 g | INTRAVENOUS | Status: DC
  Administered 2023-08-08 – 2023-08-10 (×3): 2 g via INTRAVENOUS
  Filled 2023-08-08 (×3): qty 12.5

## 2023-08-08 MED ORDER — DOCUSATE SODIUM 50 MG/5ML PO LIQD
100.0000 mg | Freq: Two times a day (BID) | ORAL | Status: DC
  Administered 2023-08-08 – 2023-08-09 (×3): 100 mg
  Filled 2023-08-08 (×3): qty 10

## 2023-08-08 MED ORDER — ALBUTEROL SULFATE (2.5 MG/3ML) 0.083% IN NEBU: 10.0000 mg/h | INHALATION_SOLUTION | Freq: Once | RESPIRATORY_TRACT | Status: AC

## 2023-08-08 MED ORDER — CALCIUM GLUCONATE-NACL 1-0.675 GM/50ML-% IV SOLN
1.0000 g | Freq: Once | INTRAVENOUS | Status: AC
  Administered 2023-08-08: 1000 mg via INTRAVENOUS
  Filled 2023-08-08: qty 50

## 2023-08-08 MED ORDER — SODIUM BICARBONATE 8.4 % IV SOLN
50.0000 meq | Freq: Once | INTRAVENOUS | Status: AC
  Administered 2023-08-08: 50 meq via INTRAVENOUS

## 2023-08-08 MED ORDER — HEPARIN SODIUM (PORCINE) 5000 UNIT/ML IJ SOLN
5000.0000 [IU] | Freq: Three times a day (TID) | INTRAMUSCULAR | Status: DC
  Administered 2023-08-08 – 2023-08-09 (×4): 5000 [IU] via SUBCUTANEOUS
  Filled 2023-08-08 (×5): qty 1

## 2023-08-08 MED ORDER — REVEFENACIN 175 MCG/3ML IN SOLN
175.0000 ug | Freq: Every day | RESPIRATORY_TRACT | Status: DC
  Administered 2023-08-09 – 2023-08-15 (×7): 175 ug via RESPIRATORY_TRACT
  Filled 2023-08-08 (×7): qty 3

## 2023-08-08 MED ORDER — ARFORMOTEROL TARTRATE 15 MCG/2ML IN NEBU
15.0000 ug | INHALATION_SOLUTION | Freq: Two times a day (BID) | RESPIRATORY_TRACT | Status: DC
  Administered 2023-08-08 – 2023-08-15 (×11): 15 ug via RESPIRATORY_TRACT
  Filled 2023-08-08 (×10): qty 2

## 2023-08-08 MED ORDER — FENTANYL BOLUS VIA INFUSION: 25.0000 ug | INTRAVENOUS | Status: DC | PRN

## 2023-08-08 MED ORDER — POLYETHYLENE GLYCOL 3350 17 G PO PACK
17.0000 g | PACK | Freq: Every day | ORAL | Status: DC
  Administered 2023-08-09: 17 g
  Filled 2023-08-08: qty 1

## 2023-08-08 MED ORDER — NOREPINEPHRINE 4 MG/250ML-% IV SOLN
2.0000 ug/min | INTRAVENOUS | Status: DC
  Administered 2023-08-08: 2 ug/min via INTRAVENOUS
  Filled 2023-08-08: qty 250

## 2023-08-08 MED ORDER — PHENYLEPHRINE HCL-NACL 20-0.9 MG/250ML-% IV SOLN: 0.0000 ug/min | INTRAVENOUS | Status: DC

## 2023-08-08 MED ORDER — FENTANYL 2500MCG IN NS 250ML (10MCG/ML) PREMIX INFUSION: 25.0000 ug/h | INTRAVENOUS | Status: DC

## 2023-08-08 MED ORDER — BUDESONIDE 0.5 MG/2ML IN SUSP
0.5000 mg | Freq: Two times a day (BID) | RESPIRATORY_TRACT | Status: DC
  Administered 2023-08-08 – 2023-08-15 (×14): 0.5 mg via RESPIRATORY_TRACT
  Filled 2023-08-08 (×14): qty 2

## 2023-08-08 MED ORDER — VANCOMYCIN HCL IN DEXTROSE 1-5 GM/200ML-% IV SOLN
1000.0000 mg | Freq: Once | INTRAVENOUS | Status: AC
  Administered 2023-08-08: 1000 mg via INTRAVENOUS
  Filled 2023-08-08: qty 200

## 2023-08-08 MED ORDER — FENTANYL CITRATE PF 50 MCG/ML IJ SOSY
PREFILLED_SYRINGE | INTRAMUSCULAR | Status: AC
  Filled 2023-08-08: qty 1

## 2023-08-08 MED ORDER — ALBUTEROL SULFATE (2.5 MG/3ML) 0.083% IN NEBU: 2.5000 mg | INHALATION_SOLUTION | RESPIRATORY_TRACT | Status: DC | PRN

## 2023-08-08 MED ORDER — SODIUM CHLORIDE 0.9 % IV SOLN
250.0000 mL | INTRAVENOUS | Status: AC
  Administered 2023-08-08: 250 mL via INTRAVENOUS

## 2023-08-08 MED ORDER — MIDAZOLAM HCL 5 MG/5ML IJ SOLN
2.0000 mg | Freq: Once | INTRAMUSCULAR | Status: AC
  Administered 2023-08-08: 2 mg via INTRAVENOUS
  Filled 2023-08-08: qty 5

## 2023-08-08 MED ORDER — ORAL CARE MOUTH RINSE
15.0000 mL | OROMUCOSAL | Status: DC
  Administered 2023-08-08 – 2023-08-15 (×84): 15 mL via OROMUCOSAL

## 2023-08-08 MED ORDER — DEXMEDETOMIDINE HCL IN NACL 400 MCG/100ML IV SOLN
INTRAVENOUS | Status: AC
  Filled 2023-08-08: qty 100

## 2023-08-08 MED ORDER — IPRATROPIUM BROMIDE 0.02 % IN SOLN
0.5000 mg | Freq: Once | RESPIRATORY_TRACT | Status: AC
  Administered 2023-08-08: 0.5 mg via RESPIRATORY_TRACT
  Filled 2023-08-08: qty 2.5

## 2023-08-08 MED ORDER — PANTOPRAZOLE SODIUM 40 MG IV SOLR
40.0000 mg | Freq: Every day | INTRAVENOUS | Status: DC
  Administered 2023-08-08 – 2023-08-14 (×7): 40 mg via INTRAVENOUS
  Filled 2023-08-08 (×7): qty 10

## 2023-08-08 NOTE — ED Notes (Signed)
Pt leaving with [facility] at this time. Advised [facility] the xray has not came back to use CVC yet. Pt has had staff or family with him at all times during ED visit. Have been unable to pick up any sats due to hypoxia, nail beds blue and would not pick up on head or ear. Pt has been over breathing the vent most of time intubated. Skin tears noted to bilateral lateral wrist that was bandaged with telfa

## 2023-08-08 NOTE — ED Notes (Signed)
Dr Rhunette Croft aware of bp's and needing pressors.

## 2023-08-08 NOTE — ED Notes (Signed)
Dr Rhunette Croft aware of pt needing sedation, pt arms/mouth tense but not biting on tube.

## 2023-08-08 NOTE — ED Triage Notes (Addendum)
Pt brought in by [facility]. Family called EMS 1 hour ago ago saying pt was unresponsive and when EMS arrived he was fully alert, BP 80/60, and pt refused to come to the ED at that time. Later on, EMS was called back again by family saying pt was having difficulty breathing. EMS arrived and pt was unresponsive but had pulses and was breathing. Pt's HR was noted to start dropping to 23 bpm by EMS right before arrival to ED per EMS. Pt arrived in ED at 1204 and CPR was initiated by EMS and ED staff.

## 2023-08-08 NOTE — ED Notes (Signed)
CPR initiated

## 2023-08-08 NOTE — Progress Notes (Signed)
Pharmacy Antibiotic Note  Todd [name] is a 73 y.o. male admitted on 08/08/2023 presenting with SOB and cardiac arrest, concern for sepsis.  Pharmacy has been consulted for Vancomycin and cefepime dosing.  Plan: Vancomycin 1g IV x 1, then 750 mg IV q 24h (eAUC 541) Cefepime 2g IV q 24h Monitor renal function, Cx and clinical progression to narrow Vancomycin levels as indicated   Height: 5\' 8"  (172.7 cm) Weight: 44.9 kg (98 lb 15.8 oz) IBW/kg (Calculated) : 68.4  Temp (24hrs), Avg:96.4 F (35.8 C), Min:92.7 F (33.7 C), Max:97.6 F (36.4 C)  Recent Labs  Lab 08/02/23 0331 08/08/23 1220 08/08/23 1431  WBC  --  4.9  --   CREATININE 1.53* 1.50*  1.60*  --   LATICACIDVEN  --  >9.0* 8.4*    Estimated Creatinine Clearance: 27.9 mL/min (A) (by C-G formula based on SCr of 1.5 mg/dL (H)).    No Known Allergies  Daylene Posey, PharmD, [facility] Clinical Pharmacist ED Pharmacist Phone # [phone] 08/08/2023 5:43 PM

## 2023-08-08 NOTE — ED Notes (Signed)
Pt nail beds blue. Pt warm to touch. Pt tense to arms. Wounds/open skin noted to feet with swelling from feet to thighs and scrotal area appears slightly swollen as well.

## 2023-08-08 NOTE — Progress Notes (Signed)
Patient arrived at 27 via [facility] from [facility]. Right AC PIV noted to be edamatous. It had levophed and neosynephrine infusing. The pressors were immediately switched to CVC after Jaclynn Guarneri, NP in room confirmed placement. Pharmacy was notified of this.

## 2023-08-08 NOTE — ED Provider Notes (Signed)
[place] EMERGENC[facility] DEPARTMENT AT [facility] Provider Note   CSN: [id number] Arrival date & time: 08/08/23  1205     History  Chief Complaint  Patient presents with   Todd [name] is a 73 y.o. male.  HPI    73 year old male with history of A-fib, PAD, carotid stenosis, GI bleed, pulmonary hypertension and anasarca comes in with chief complaint of cardiac arrest and shortness of breath.  History provided by EMS and family member.  Per patient's wife, patient has been having exertional shortness of breath that has worsened since the time he was discharged.  This morning he was well, but then all of a sudden she heard loud breathing sounds.  When she went to check on the patient, his eyes had rolled back and he was shaking.  She called 911.  By the time they arrived, patient was back to baseline normal and refused transfer.  About an hour later, patient again was found to be breathing heavily.  She called EMS.  When EMS arrived, patient was in respiratory distress.  As they got closer to the ER, patient's O2 sat started dropping, then his heart rate dropped and he went into cardiac arrest.  CPR was initiated.  Per family, no recent fevers, chills.  No history of strokes.  Home Medications Prior to Admission medications   Medication Sig Start Date End Date Taking? Authorizing Provider  aspirin EC 81 MG EC tablet Take 1 tablet (81 mg total) by mouth daily at 6 (six) AM. Swallow whole. 03/17/21   Setzer, Lynnell Jude, PA-C  cholecalciferol (VITAMIN D3) 25 MCG (1000 UNIT) tablet Take 1,000 Units by mouth daily.    [provider]  clopidogrel (PLAVIX) 75 MG tablet TAKE ONE TABLET BY MOUTH ONCE DAILY. 05/05/23   Jonelle Sidle, MD  diclofenac Sodium (VOLTAREN) 1 % GEL Apply 4 g topically 4 (four) times daily as needed (pain). 01/31/23   [provider]  Ensure (ENSURE) Take 1 Can by mouth daily.    [provider]  FARXIGA 10 MG  TABS tablet Take 1 tablet (10 mg total) by mouth daily before breakfast. 05/30/23   Romie Minus, MD  furosemide (LASIX) 20 MG tablet Take 1 tablet (20 mg total) by mouth daily. 06/12/23   Romie Minus, MD  levothyroxine (SYNTHROID) 25 MCG tablet Take 25 mcg by mouth daily before breakfast.    [provider]  lipase/protease/amylase (CREON) 36000 UNITS CPEP capsule Take 2 capsules (72,000 Units total) by mouth 3 (three) times daily before meals. And 1 capsule with snacks 03/15/23 03/14/24  Lanelle Bal, DO  pantoprazole (PROTONIX) 40 MG tablet Take 1 tablet (40 mg total) by mouth 2 (two) times daily. 11/03/22 11/03/23  Lanelle Bal, DO  rosuvastatin (CRESTOR) 10 MG tablet TAKE (1) TABLET BY MOUTH ONCE DAILY. 09/09/20   Maeola Harman, MD      Allergies    Patient has no known allergies.    Review of Systems   Review of Systems  Physical Exam Updated Vital Signs BP (!) 77/61   Pulse (!) 140   Temp (!) 96.5 F (35.8 C)   Resp (!) 29   Ht 5\' 8"  (1.727 m)   Wt 59 kg   SpO2 (!) 74%   BMI 19.78 kg/m  Physical Exam Vitals and nursing note reviewed.  Constitutional:      Comments: Unresponsive  Cardiovascular:     Comments: Pulseless Pulmonary:  Breath sounds: Rales present.     Comments: Left-sided rales appreciated Musculoskeletal:     Right lower leg: Edema present.     Left lower leg: Edema present.  Neurological:     Comments: GCS 3     ED Results / Procedures / Treatments   Labs (all labs ordered are listed, but only abnormal results are displayed) Labs Reviewed  BLOOD GAS, ARTERIAL - Abnormal; Notable for the following components:      Result Value   pH, Arterial 7.08 (*)    pCO2 arterial 66 (*)    Bicarbonate 19.7 (*)    Acid-base deficit 11.4 (*)    All other components within normal limits  COMPREHENSIVE METABOLIC PANEL - Abnormal; Notable for the following components:   Chloride 96 (*)    Glucose, Bld 141 (*)    BUN 24  (*)    Creatinine, Ser 1.50 (*)    Calcium 7.6 (*)    Total Protein 6.3 (*)    Albumin 2.7 (*)    AST 271 (*)    ALT 95 (*)    GFR, Estimated 49 (*)    Anion gap 18 (*)    All other components within normal limits  LACTIC ACID, PLASMA - Abnormal; Notable for the following components:   Lactic Acid, Venous >9.0 (*)    All other components within normal limits  CBC - Abnormal; Notable for the following components:   RBC 4.03 (*)    Hemoglobin 11.1 (*)    HCT 38.5 (*)    MCHC 28.8 (*)    RDW 19.3 (*)    Platelets 82 (*)    nRBC 3.9 (*)    All other components within normal limits  APTT - Abnormal; Notable for the following components:   aPTT 58 (*)    All other components within normal limits  PROTIME-INR - Abnormal; Notable for the following components:   Prothrombin Time 18.6 (*)    INR 1.5 (*)    All other components within normal limits  BRAIN NATRIURETIC PEPTIDE - Abnormal; Notable for the following components:   B Natriuretic Peptide 1,697.0 (*)    All other components within normal limits  I-STAT CHEM 8, ED - Abnormal; Notable for the following components:   BUN 24 (*)    Creatinine, Ser 1.60 (*)    Glucose, Bld 130 (*)    Calcium, Ion 0.80 (*)    TCO2 21 (*)    Hemoglobin 12.2 (*)    HCT 36.0 (*)    All other components within normal limits  TROPONIN I (HIGH SENSITIVITY) - Abnormal; Notable for the following components:   Troponin I (High Sensitivity) 86 (*)    All other components within normal limits  CULTURE, BLOOD (ROUTINE X 2)  CULTURE, BLOOD (ROUTINE X 2)  CULTURE, RESPIRATORY W GRAM STAIN  URINE CULTURE  RESP PANEL BY RT-PCR (RSV, FLU A&B, COVID)  RVPGX2  MAGNESIUM  PHOSPHORUS  RAPID URINE DRUG SCREEN, HOSP PERFORMED  TRIGLYCERIDES  LACTIC ACID, PLASMA  LACTIC ACID, PLASMA  LACTIC ACID, PLASMA  PROCALCITONIN  URINALYSIS, ROUTINE W REFLEX MICROSCOPIC  CBG MONITORING, ED  TYPE AND SCREEN    EKG EKG Interpretation Date/Time:  Tuesday August 08 2023 12:15:01 EST Ventricular Rate:  159 PR Interval:  191 QRS Duration:  135 QT Interval:  262 QTC Calculation: 427 R Axis:   114  Text Interpretation: Wide-QRS tachycardia Paired ventricular premature complexes Right bundle branch block new changes noted Confirmed by  Derwood Kaplan [phone]) on 08/08/2023 1:56:55 PM  ED ECG REPORT   Date: 08/08/2023  Rate: 151  Rhythm: atrial fibrillation  QRS Axis: left  Intervals: normal  ST/T Wave abnormalities: nonspecific ST/T changes  Conduction Disutrbances:none  Narrative Interpretation:   Old EKG Reviewed: changes noted  I have personally reviewed the EKG tracing and agree with the computerized printout as noted.    Radiology No results found.  Procedures CPR  Date/Time: 08/08/2023 3:55 PM  Performed by: Derwood Kaplan, MD Authorized by: Derwood Kaplan, MD  CPR Procedure Details:      Amount of time prior to administration of ACLS/BLS (minutes):  5   ACLS/BLS initiated by EMS: Yes     CPR/ACLS performed in the ED: Yes     Duration of CPR (minutes):  15   Outcome: ROSC obtained    CPR performed via ACLS guidelines under my direct supervision.  See RN documentation for details including defibrillator use, medications, doses and timing. IO LINE INSERTION  Date/Time: 08/08/2023 3:55 PM  Performed by: Derwood Kaplan, MD Authorized by: Derwood Kaplan, MD   Consent:    Consent obtained:  Emergent situation Anesthesia:    Anesthesia method:  None Procedure details:    Insertion site:  R proximal tibia   Insertion device:  18 gauge IO needle   Insertion: Needle was inserted through the bony cortex     Number of attempts:  1   Insertion confirmation:  Aspiration of blood/marrow and easy infusion of fluids Post-procedure details:    Secured with:  Elastic bandage   Procedure completion:  Tolerated well, no immediate complications Central Line  Date/Time: 08/08/2023 3:56 PM  Performed by: Derwood Kaplan,  MD Authorized by: Derwood Kaplan, MD   Consent:    Consent obtained:  Verbal and written   Consent given by:  Spouse   Risks, benefits, and alternatives were discussed: yes     Risks discussed:  Bleeding, infection, pneumothorax and arterial puncture Universal protocol:    Procedure explained and questions answered to patient or proxy's satisfaction: yes     Relevant documents present and verified: yes     Test results available: yes     Imaging studies available: yes     Required blood products, implants, devices, and special equipment available: yes     Site/side marked: yes     Immediately prior to procedure, a time out was called: yes     Patient identity confirmed:  Arm band Pre-procedure details:    Indication(s): central venous access and hemodynamic monitoring     Skin preparation:  Chlorhexidine   Skin preparation agent: Skin preparation agent completely dried prior to procedure   Anesthesia:    Anesthesia method:  Local infiltration   Local anesthetic:  Lidocaine 1% WITH epi Procedure details:    Location:  R internal jugular   Patient position:  Trendelenburg   Procedural supplies:  Triple lumen   Landmarks identified: yes     Ultrasound guidance: yes     Ultrasound guidance timing: prior to insertion and real time     Sterile ultrasound techniques: Sterile gel and sterile probe covers were used     Number of attempts:  1   Successful placement: yes   Post-procedure details:    Post-procedure:  Dressing applied and line sutured   Assessment:  Blood return through all ports and no pneumothorax on x-ray   Procedure completion:  Tolerated .Critical Care  Performed by: Derwood Kaplan, MD Authorized by: Rhunette Croft,  Andres Vest, MD   Critical care provider statement:    Critical care time (minutes):  78   Critical care time was exclusive of:  Separately billable procedures and treating other patients   Critical care was necessary to treat or prevent imminent or  life-threatening deterioration of the following conditions:  Respiratory failure, cardiac failure and circulatory failure   Critical care was time spent personally by me on the following activities:  Development of treatment plan with patient or surrogate, discussions with consultants, evaluation of patient's response to treatment, examination of patient, ordering and review of laboratory studies, ordering and review of radiographic studies, ordering and performing treatments and interventions, pulse oximetry, re-evaluation of patient's condition, review of old charts and obtaining history from patient or surrogate Ultrasound ED Echo  Date/Time: 08/08/2023 3:57 PM  Performed by: Derwood Kaplan, MD Authorized by: Derwood Kaplan, MD   Procedure details:    Indications: cardiac arrest     Views: subxiphoid and parasternal long axis view     Images: not archived     Limitations:  Body habitus Findings:    Pericardium: small pericardial effusion     Cardiac Activity: hyperdynamic     LV Function: depressed (30 - 50%)     RV Diameter: dilated     IVC: normal   Impression:    Impression: pericardial effusion present       Medications Ordered in ED Medications  Chlorhexidine Gluconate Cloth 2 % PADS 6 each (6 each Topical Given 08/08/23 1306)  propofol (DIPRIVAN) 1000 MG/100ML infusion (has no administration in time range)  0.9 %  sodium chloride infusion (has no administration in time range)  norepinephrine (LEVOPHED) 4mg  in (0.016 mg/mL) premix infusion (2 mcg/min Intravenous New Bag/Given 08/08/23 1344)  EPINEPHrine (ADRENALIN) 1 MG/10ML injection (1 mg Intravenous Given 08/08/23 1211)  sodium bicarbonate injection (50 mEq Intravenous Given 08/08/23 1212)  lactated ringers bolus 1,000 mL (1,000 mLs Intravenous New Bag/Given 08/08/23 1305)  midazolam (VERSED) 5 MG/5ML injection 2 mg (2 mg Intravenous Given 08/08/23 1249)  fentaNYL (SUBLIMAZE) injection 50 mcg (50 mcg Intravenous Given  08/08/23 1250)  albuterol (PROVENTIL) (2.5 MG/3ML) 0.083% nebulizer solution (10 mg/hr Nebulization Given 08/08/23 1306)  ipratropium (ATROVENT) nebulizer solution 0.5 mg (0.5 mg Nebulization Given 08/08/23 1306)    ED Course/ Medical Decision Making/ A&P                                 Medical Decision Making Amount and/or Complexity of Data Reviewed Labs: ordered. Radiology: ordered.  Risk OTC drugs. Prescription drug management. Decision regarding hospitalization.   74 year old male with history of pulmonary hypertension, anasarca, PAD and A-fib comes in with chief complaint of cardiac arrest.  Differential diagnosis considered for him includes arrhythmia, respiratory arrest, myocardial infarction, severe electrolyte abnormality, profound anemia, stroke, brain bleed, seizures, COVID-19, flu, cardiogenic shock, ARDS  Patient arrested right before EMS entered the emergency room.  Patient had not received any medications.  He was being bagged.  GCS 3.  Patient intubated.  CPR/ACLS protocol continued. We had ROSC.  On exam, patient has rales, particularly over the lower lung fields on left side.  Case discussed with family.  Recent discharge diagnosis and discharge note noted.  Patient was hypotensive after ROSC. IV fluids ordered, and his blood pressure responded.  Clinically does not appear that patient has infection as the cause.  Patient's blood pressure dropped later in the patient's stay.  We decided not to give him Lasix for now, let him normalize and let admitting team diurese him.  At the request of the ICU team, central line was placed in the ED.  Final Clinical Impression(s) / ED Diagnoses Final diagnoses:  Acute congestive heart failure, unspecified heart failure type (HCC)  Acute pulmonary edema (HCC)  Acute respiratory failure with hypoxia and hypercapnia (HCC)  Lactic acidosis    Rx / DC Orders ED Discharge Orders     None         Derwood Kaplan,  MD 08/08/23 1600

## 2023-08-08 NOTE — Progress Notes (Signed)
Attending note: I have seen and examined the patient. History, labs and imaging reviewed.  73 year old with hypertension, hyperlipidemia, alcohol abuse, tobacco use, PAD, PAD.  With recent admission for left foot pain, AKI, anasarca, left SFA stent placement Readmitted with altered mental status, hypotension, acute hypoxic respiratory failure leading up to a cardiac arrest with ROSC in 15 minutes.  Intubated and transferred to [facility]  Blood pressure 91/67, pulse (!) 136, temperature 97.6 F (36.4 C), resp. rate (!) 22, height 5\' 8"  (1.727 m), weight 44.9 kg, SpO2 (!) 74%. Gen:      No acute distress, chronically ill-appearing HEENT:  EOMI, sclera anicteric Neck:     No masses; no thyromegaly, ET tube Lungs:    Clear to auscultation bilaterally; normal respiratory effort CV:         Regular rate and rhythm; no murmurs Abd:      + bowel sounds; soft, non-tender; no palpable masses, no distension Ext:    Left foot wrapped in bandages, 2+ pitting edema in the lower extremities Skin:      Warm and dry; no rash Neuro: Sedated, unresponsive  Labs/Imaging personally reviewed, significant for BUN/creatinine 24/1.50 AST 271, ALT 95, BNP 1697 Lactic acid 8.4 Hemoglobin 11.1, platelets 82  Chest x-ray with worsening interstitial and bilateral parenchymal opacities, pleural effusion.  Assessment/plan: Cardiac arrest, likely secondary to acute hypoxic respiratory failure COVID-positive, sepsis present on admission Chest x-ray improved after intubation and likely has a major component of pulmonary edema Start broad antibiotic coverage with Vanco, cefepime IV Solu-Medrol Increase PEEP to help with oxygenation Low tidal volume ventilation.  Maintain plateau pressure less than 30, driving pressure less than 15 Wean pressors as tolerated Bronchodilators Check cultures, urine strep, Legionella, echocardiogram  Pulmonary hypertension Atrial fibrillation Supportive care,  echocardiogram Telemetry monitoring  AKI Monitor urine output and creatinine  Transaminitis, shock liver Likely has baseline cirrhosis Follow labs  The patient is critically ill with multiple organ systems failure and requires high complexity decision making for assessment and support, frequent evaluation and titration of therapies, application of advanced monitoring technologies and extensive interpretation of multiple databases.  Critical care time - 35 mins. This represents my time independent of the NPs time taking care of the pt.  Chilton Greathouse MD [facility] 08/08/2023, 5:32 PM

## 2023-08-08 NOTE — ED Notes (Signed)
Edp has spoken and updated family. Wife, son and daughter at bedside. Edp aware bps in 80s. Currently receiving LR bolus as well.

## 2023-08-08 NOTE — ED Notes (Signed)
In with DR Rhunette Croft the last 30 min performing CVC to right jugular.

## 2023-08-08 NOTE — ED Notes (Signed)
Report given to KRN, RN at [facility], no further questions at this time.

## 2023-08-08 NOTE — H&P (Signed)
NAME:  Todd [name], MRN:  [id number], DOB:  07-09-1950, LOS: 0 ADMISSION DATE:  08/08/2023, CONSULTATION DATE:  08/08/2023 REFERRING MD:  Dr. Rhunette Croft, CHIEF COMPLAINT:  cardiac arrest   History of Present Illness:  Pt encephalopathic, therefore HPI obtained from EMR.   27 yoM with PMH of tobacco abuse, HTN, atrial fibrillation (not on AC given prior GIBs), pulmonary hypertension, HLD, ETOH abuse w/hx of withdrawal seizures, PAD, hypothyroidism, GERD, chronic pancreatitis, pancreatic duct dilation/ IPMN, and suspected cirrhosis presenting to [facility] ER due to altered mental status.  Family reports ongoing exertional SOB since recent discharge.  Recent hospitalization for 1/30- 2/5 left foot pain with ulcers, AKI, and SOB with anasarca treated with IV diureses;  underwent balloon angioplasty with left SFA stent placement with vascular surgery on 2/3.  Wife reported concerns of AMS, SBP 80's, with shaking but pt had returned to his baseline by EMS arrival and refused transport.  An hour later, pt was in respiratory distress and hypoxic.  By arrival to ER by EMS, HR had dropped to 20's and become pulseless.  ACLS measures provided including CPR, intubation, with ROSC after 15 mins.   ER workup significant for plts 82, AG 18, iCa 0.8, AST/ ALT 271/ 95, BNP 1697 (1769 on 1/30), trop 86, lactic > 9, INR 1.5, CXR showing extensive interstitial opacities favoring pulmonary edema with right small to moderate pleural effusion.  SARS 2 positive.  ABG 7.08/ 66/ 97/ 19.7.  Requiring vasopressor support.  CVL placed.  To be transferred to [facility] for higher level of care, PCCM accepted.   Pertinent  Medical History  Tobacco abuse, HTN, atrial fibrillation (not on AC given prior GIBs), pulmonary hypertension, HLD, ETOH abuse w/hx of withdrawal seizures, PAD, hypothyroidism, GERD, chronic pancreatitis, pancreatic duct dilation/ IPMN, suspected cirrhosis  Significant Hospital Events: Including procedures, antibiotic  start and stop dates in addition to other pertinent events     Interim History / Subjective:  Pressors continued through PIV which infiltrated in R AC.  Per [facility], given fentanyl and versed for clenching on ETT with higher PIP, no purposeful movement thus far  Objective   Blood pressure (!) 77/61, pulse (!) 140, temperature (!) 96.6 F (35.9 C), temperature source Other (Comment), resp. rate (!) 29, height 5\' 8"  (1.727 m), weight 59 kg, SpO2 (!) 74%.    Vent Mode: PRVC FiO2 (%):  [60 %-100 %] 60 % Set Rate:  [16 bmp] 16 bmp Vt Set:  [550 mL] 550 mL PEEP:  [5 cmH20] 5 cmH20 Plateau Pressure:  [18 cmH20] 18 cmH20  No intake or output data in the 24 hours ending 08/08/23 1416 Filed Weights   08/08/23 1224  Weight: 59 kg   Examination: General:  AoC ill appearing thin elderly male lying in bed in NAD HEENT: pupils pinpoint, ETT/ OGT Neuro: some resistance in UE, no purposeful movement currently, not f/c, opening eyes CV: irir, afib rates 120-140, minimal soft swelling at R internal jugular site PULM:  MV supported, breathing over set rate at 25, L> R rales, no wheeze GI: thin, scant BS, foley- hazy urine Extremities: cool in feet with dopplered Pts, intermittent DP /dry, pitting edema up to abd, +2-3 in LE, mottled fingertips/ toes  Skin: skin tears to BUE with older appearing ecchymosis, wounds to BLE- L foot wrapped in kerlex, L foot with prior blister,  see pictures  Resolved Hospital Problem list     Assessment & Plan:   Cardiac arrest- suspect respiratory etiology. With  reports of shaking, r/o seizure 2/2 hypoxia, hypoperfusion vs ?ETOH withdrawal seizure - hemodynamic support, NE for goal MAP > 65.  Wean off Neo, add vasopressin - place aline - trend CVP - pan-culture, empiric broad spectrum abxs for now - trend lactic, check PCT - cardiac workup as below.  Check coox, echo   Acute hypoxic and hypercarbic respiratory failure 2/2 to ?ARDS related  SARS 2 positive vs  pulmonary edema vs r/o CAP Tobacco abuse - post central line CXR much improved just with positive pressure, suspect pulmonary edema  - full MV support, 4-8cc/kg IBW with goal Pplat <30 and DP<15  - VAP prevention protocol/ PPI - PAD protocol for sedation> precedex, prn fentanyl.  RASS goal 0/-1 - CXR/ ABG now, CXR in am - adjust peep/ FiO2 as able for SpO2 >90%  - daily SAT & SBT when appropriate  - aggressive pulmonary hygiene> IS, flutter, mobilize  - bronchodilators > brovana, pulmicort, yupleri and prn albuterol  - solumedrol 40mg  BID - trach asp, check urine strep/ legionella   Acute metabolic encephalopathy Hx of ETOH abuse- wife reports pt has not been drinking recently  - normothermia protocol, avoid fever x 48hrs  - CTH negative for acute intracranial process  - EEG  - check ammonia  - maintain neuro protective measures; goal for eurothermia, euglycemia, eunatermia, normoxia, and PCO2 goal of 35-40 - Seizure precautions  - empiric thiamine  - CBG q4, add SSI prn if > 180, monitor for hypoglycemia   Pulmonary hypertension - s/p RHC 06/12/23  c/w WHO Group III with mild increase in filling pressures w/passive leg raises, normal CO by assumed Fick HTN - hemodynamic support as above - echo - does not appear to be on antihypertensives at home    Afib, permanent  - not on AC due to prior GIBs, prior cardioversion's failed - optimize electrolytes  - if HR not improved with precedex, will need amio given hypotension.    AKI  Hypocalcemia  - normal renal function in December  -  trend renal indices  - cont foley  - consider renal US/ lytes if worsening  - strict I/Os, daily wts - avoid nephrotoxins, renal dose meds, hemodynamic support as above   Transaminitis Anasarca  Hx pancreatic duct dilation/ IPMN, chronic pancreatitis - suspect shock liver +/- hepatic congestion, prior concern for cirrhosis on RUQ imaging  - check lipase - trend CMET/ coags   Chronic  normocytic anemia Thrombocytopenia - H/H at baseline.  Recent plt 160, now 82.  Could be related to possibe liver disease, r/o sepsis.  On ASA/ plavix - trend CBC, transfuse for Hgb < 7   GERD - PPI   Hypothyroidism  TSH 0.967 on 07/04/23 - cont synthroid   Severe PAD prior recent left SFA balloon angioplasty with stent - ASA/ plavix, hold for tonight.  If Hgb remains stable, restart 2/12 am   Sacral pressure wound, POA BLE foot wounds Protein-calorie malnutrition - hx of ongoing wt loss, suspect his anasarca is masking weight loss - WOC consult - RD consult   IV infiltration RUE of vasopressors - phentolamine and elevation per protocol   Poor mobility/ deconditioning  - reports limited mobility for months secondary to PAD, suspect PH, and other factors also contributing  - PT/ OT when appropriate    GOC - wife and children present.  Remains full code for now, aware of MODS and high risk for further deterioration.  Consider PMT.    Best Practice (right  click and "Reselect all SmartList Selections" daily)   Diet/type: NPO DVT prophylaxis prophylactic heparin  Pressure ulcer(s): present on admission  GI prophylaxis: PPI Lines: Central line Foley:  Yes, and it is still needed Code Status:  full code Last date of multidisciplinary goals of care discussion [2/11]  Wife and children present.   Son, Elige Radon can also be reached at [phone]  Labs   CBC: Recent Labs  Lab 08/08/23 1220  WBC 4.9  HGB 11.1*  12.2*  HCT 38.5*  36.0*  MCV 95.5  PLT 82*    Basic Metabolic Panel: Recent Labs  Lab 08/01/23 1641 08/02/23 0331 08/08/23 1220  NA  --  135 136  135  K 3.4* 4.0 4.4  4.4  CL  --  95* 96*  102  CO2  --  28 22  GLUCOSE  --  80 141*  130*  BUN  --  18 24*  24*  CREATININE  --  1.53* 1.50*  1.60*  CALCIUM  --  7.9* 7.6*  MG  --   --  2.2  PHOS  --   --  4.6   GFR: Estimated Creatinine Clearance: 36.6 mL/min (A) (by C-G formula based on  SCr of 1.5 mg/dL (H)). Recent Labs  Lab 08/08/23 1220  WBC 4.9  LATICACIDVEN >9.0*    Liver Function Tests: Recent Labs  Lab 08/08/23 1220  AST 271*  ALT 95*  ALKPHOS 65  BILITOT 0.8  PROT 6.3*  ALBUMIN 2.7*   No results for input(s): "LIPASE", "AMYLASE" in the last 168 hours. No results for input(s): "AMMONIA" in the last 168 hours.  ABG    Component Value Date/Time   PHART 7.08 (LL) 08/08/2023 1227   PCO2ART 66 (HH) 08/08/2023 1227   PO2ART 97 08/08/2023 1227   HCO3 19.7 (L) 08/08/2023 1227   TCO2 21 (L) 08/08/2023 1220   ACIDBASEDEF 11.4 (H) 08/08/2023 1227   O2SAT 96.1 08/08/2023 1227     Coagulation Profile: Recent Labs  Lab 08/08/23 1220  INR 1.5*    Cardiac Enzymes: No results for input(s): "CKTOTAL", "CKMB", "CKMBINDEX", "TROPONINI" in the last 168 hours.  HbA1C: No results found for: "HGBA1C"  CBG: No results for input(s): "GLUCAP" in the last 168 hours.  Review of Systems:   unable  Past Medical History:  He,  has a past medical history of Alcoholism (HCC), Anemia, Atrial fibrillation (HCC), Carotid artery occlusion, Chronic back pain, Colitis, Dysrhythmia, Essential hypertension, Gout, Heart murmur, History of GI bleed, Hypothyroidism, Internal hemorrhoids, Osteoarthritis, Peripheral vascular disease (HCC), and Schatzki's ring.   Surgical History:   Past Surgical History:  Procedure Laterality Date   ABDOMINAL AORTOGRAM N/A 03/08/2019   Procedure: ABDOMINAL AORTOGRAM;  Surgeon: Sherren Kerns, MD;  Location: [facility] INVASIVE CV LAB;  Service: Cardiovascular;  Laterality: N/A;   ABDOMINAL AORTOGRAM W/LOWER EXTREMITY N/A 03/02/2021   Procedure: ABDOMINAL AORTOGRAM W/LOWER EXTREMITY;  Surgeon: Nada Libman, MD;  Location: [facility] INVASIVE CV LAB;  Service: Cardiovascular;  Laterality: N/A;   ABDOMINAL AORTOGRAM W/LOWER EXTREMITY N/A 07/31/2023   Procedure: ABDOMINAL AORTOGRAM W/LOWER EXTREMITY;  Surgeon: Daria Pastures, MD;  Location: [facility] INVASIVE CV  LAB;  Service: Cardiovascular;  Laterality: N/A;   APPLICATION OF WOUND VAC Right 07/22/2021   Procedure: APPLICATION OF WOUND VAC;  Surgeon: Nada Libman, MD;  Location: [facility] OR;  Service: Vascular;  Laterality: Right;   BIOPSY  10/20/2022   Procedure: BIOPSY;  Surgeon: Lanelle Bal, DO;  Location: [facility] ENDO  SUITE;  Service: Endoscopy;;   COLONOSCOPY WITH PROPOFOL N/A 10/20/2022   Procedure: COLONOSCOPY WITH PROPOFOL;  Surgeon: Lanelle Bal, DO;  Location: [facility];  Service: Endoscopy;  Laterality: N/A;   COLONOSCOPY WITH PROPOFOL N/A 12/09/2022   Procedure: COLONOSCOPY WITH PROPOFOL;  Surgeon: Lanelle Bal, DO;  Location: [facility];  Service: Endoscopy;  Laterality: N/A;  10:15 AM, ASA 3   ENDARTERECTOMY Left 02/02/2022   Procedure: LEFT ENDARTERECTOMY CAROTID;  Surgeon: Nada Libman, MD;  Location: [facility] OR;  Service: Vascular;  Laterality: Left;   ESOPHAGOGASTRODUODENOSCOPY (EGD) WITH PROPOFOL N/A 10/20/2022   Procedure: ESOPHAGOGASTRODUODENOSCOPY (EGD) WITH PROPOFOL;  Surgeon: Lanelle Bal, DO;  Location: [facility];  Service: Endoscopy;  Laterality: N/A;   ESOPHAGOGASTRODUODENOSCOPY (EGD) WITH PROPOFOL N/A 02/07/2023   Procedure: ESOPHAGOGASTRODUODENOSCOPY (EGD) WITH PROPOFOL;  Surgeon: Meridee Score Netty Starring., MD;  Location: [facility] ENDOSCOPY;  Service: Gastroenterology;  Laterality: N/A;   EUS N/A 02/07/2023   Procedure: UPPER ENDOSCOPIC ULTRASOUND (EUS) RADIAL;  Surgeon: Lemar Lofty., MD;  Location: [facility] ENDOSCOPY;  Service: Gastroenterology;  Laterality: N/A;   Excision of gouty lesion     Left index finger   FEMORAL-POPLITEAL BYPASS GRAFT Right 03/12/2021   Procedure: RIGHT FEMORAL TO POPLITEAL ARTERY BYPASS GRAFTING USING THE PROPATEN GRAFT;  Surgeon: Nada Libman, MD;  Location: [facility] OR;  Service: Vascular;  Laterality: Right;   FINE NEEDLE ASPIRATION N/A 02/07/2023   Procedure: FINE NEEDLE ASPIRATION (FNA) LINEAR;  Surgeon: Lemar Lofty., MD;   Location: [facility];  Service: Gastroenterology;  Laterality: N/A;   GROIN DEBRIDEMENT Right 07/22/2021   Procedure: RIGHT GROIN DEBRIDEMENT;  Surgeon: Nada Libman, MD;  Location: [facility] OR;  Service: Vascular;  Laterality: Right;   HOT HEMOSTASIS N/A 02/07/2023   Procedure: HOT HEMOSTASIS (ARGON PLASMA COAGULATION/BICAP);  Surgeon: Lemar Lofty., MD;  Location: [facility]OS[facility];  Service: Gastroenterology;  Laterality: N/A;   INSERTION OF ILIAC STENT Right 03/12/2021   Procedure: INSERTION OF  EXTERNAL ILIAC STENT;  Surgeon: Nada Libman, MD;  Location: [facility] OR;  Service: Vascular;  Laterality: Right;   LOWER EXTREMITY ANGIOGRAM Right 03/12/2021   Procedure: LOWER EXTREMITY ANGIOGRAM;  Surgeon: Nada Libman, MD;  Location: [facility] OR;  Service: Vascular;  Laterality: Right;   LOWER EXTREMITY ANGIOGRAPHY Bilateral 03/08/2019   Procedure: Lower Extremity Angiography;  Surgeon: Sherren Kerns, MD;  Location: [facility] INVASIVE CV LAB;  Service: Cardiovascular;  Laterality: Bilateral;   LOWER EXTREMITY ANGIOGRAPHY N/A 03/15/2021   Procedure: LOWER EXTREMITY ANGIOGRAPHY;  Surgeon: Maeola Harman, MD;  Location: [facility] INVASIVE CV LAB;  Service: Cardiovascular;  Laterality: N/A;   MASS EXCISION Right 07/07/2020   Procedure: EXCISION OF RIGHT FACIAL MASS;  Surgeon: Newman Pies, MD;  Location: [facility];  Service: ENT;  Laterality: Right;   PATCH ANGIOPLASTY Left 02/02/2022   Procedure: PATCH ANGIOPLASTY OF LEFT CAROTID USING XENOSURE BOVINE PERICARDIUM PATCH;  Surgeon: Nada Libman, MD;  Location: [facility] OR;  Service: Vascular;  Laterality: Left;   PERIPHERAL VASCULAR BALLOON ANGIOPLASTY Right 03/02/2021   Procedure: PERIPHERAL VASCULAR BALLOON ANGIOPLASTY;  Surgeon: Nada Libman, MD;  Location: [facility] INVASIVE CV LAB;  Service: Cardiovascular;  Laterality: Right;  external iliac   PERIPHERAL VASCULAR INTERVENTION Bilateral 03/08/2019   Procedure: PERIPHERAL VASCULAR INTERVENTION;   Surgeon: Sherren Kerns, MD;  Location: [facility] INVASIVE CV LAB;  Service: Cardiovascular;  Laterality: Bilateral;  ext iliac artery stent   PERIPHERAL VASCULAR INTERVENTION Left 03/02/2021  Procedure: PERIPHERAL VASCULAR INTERVENTION;  Surgeon: Nada Libman, MD;  Location: [facility] INVASIVE CV LAB;  Service: Cardiovascular;  Laterality: Left;  external iliac   PERIPHERAL VASCULAR INTERVENTION Right 03/15/2021   Procedure: PERIPHERAL VASCULAR INTERVENTION;  Surgeon: Maeola Harman, MD;  Location: [facility] INVASIVE CV LAB;  Service: Cardiovascular;  Laterality: Right;  external iliac   POLYPECTOMY  12/09/2022   Procedure: POLYPECTOMY INTESTINAL;  Surgeon: Lanelle Bal, DO;  Location[facility];  Service: Endoscopy;;   RIGHT HEART CATH N/A 06/12/2023   Procedure: RIGHT HEART CATH;  Surgeon: Romie Minus, MD;  Location: [facility] INVASIVE CV LAB;  Service: Cardiovascular;  Laterality: N/A;   Right knee arthroscopy     SPINE SURGERY       Social History:   reports that he has been smoking cigarettes. He has been exposed to tobacco smoke. He has never used smokeless tobacco. He reports current alcohol use of about 5.0 standard drinks of alcohol per week. He reports that he does not use drugs.   Family History:  His family history includes Coronary artery disease in his father and sister.   Allergies No Known Allergies   Home Medications  Prior to Admission medications   Medication Sig Start Date End Date Taking? Authorizing Provider  aspirin EC 81 MG EC tablet Take 1 tablet (81 mg total) by mouth daily at 6 (six) AM. Swallow whole. 03/17/21   Setzer, Lynnell Jude, PA-C  cholecalciferol (VITAMIN D3) 25 MCG (1000 UNIT) tablet Take 1,000 Units by mouth daily.    [provider]  clopidogrel (PLAVIX) 75 MG tablet TAKE ONE TABLET BY MOUTH ONCE DAILY. 05/05/23   Jonelle Sidle, MD  diclofenac Sodium (VOLTAREN) 1 % GEL Apply 4 g topically 4 (four) times daily as needed (pain). 01/31/23    [provider]  Ensure (ENSURE) Take 1 Can by mouth daily.    [provider]  FARXIGA 10 MG TABS tablet Take 1 tablet (10 mg total) by mouth daily before breakfast. 05/30/23   Romie Minus, MD  furosemide (LASIX) 20 MG tablet Take 1 tablet (20 mg total) by mouth daily. 06/12/23   Romie Minus, MD  levothyroxine (SYNTHROID) 25 MCG tablet Take 25 mcg by mouth daily before breakfast.    [provider]  lipase/protease/amylase (CREON) 36000 UNITS CPEP capsule Take 2 capsules (72,000 Units total) by mouth 3 (three) times daily before meals. And 1 capsule with snacks 03/15/23 03/14/24  Lanelle Bal, DO  pantoprazole (PROTONIX) 40 MG tablet Take 1 tablet (40 mg total) by mouth 2 (two) times daily. 11/03/22 11/03/23  Lanelle Bal, DO  rosuvastatin (CRESTOR) 10 MG tablet TAKE (1) TABLET BY MOUTH ONCE DAILY. 09/09/20   Maeola Harman, MD     Critical care time: 65 mins       Posey Boyer, MSN, AG-ACNP-BC [facility] 08/08/2023, 6:24 PM  See Amion for pager If no response to pager , please call [phone] until 7pm After 7:00 pm call Elink  [phone]

## 2023-08-08 NOTE — ED Notes (Signed)
Pt had large mooshy brown bm. Pt cleaned

## 2023-08-08 NOTE — ED Notes (Signed)
CBG 103 - performed by Tamera Punt, MHT per Dr. Brandy Hale verbal order.

## 2023-08-08 NOTE — Procedures (Signed)
Arterial Line Insertion Start/End2/04/2024 5:25 PM, 08/08/2023 5:31 AM  Patient location: ICU. Preanesthetic checklist: patient identified, IV checked, site marked, risks and benefits discussed, surgical consent, monitors and equipment checked, pre-op evaluation and timeout performed Right, radial was placed Catheter size: 20 G Hand hygiene performed  and maximum sterile barriers used  Allen's test indicative of satisfactory collateral circulation Attempts: 2 Procedure performed without using ultrasound guided technique. Following insertion, dressing applied. Post procedure assessment: normal  Patient tolerated the procedure well with no immediate complications.

## 2023-08-09 ENCOUNTER — Inpatient Hospital Stay (HOSPITAL_COMMUNITY): Payer: PPO

## 2023-08-09 ENCOUNTER — Ambulatory Visit: Payer: PPO | Admitting: Gastroenterology

## 2023-08-09 DIAGNOSIS — I469 Cardiac arrest, cause unspecified: Secondary | ICD-10-CM | POA: Diagnosis not present

## 2023-08-09 DIAGNOSIS — R569 Unspecified convulsions: Secondary | ICD-10-CM | POA: Diagnosis not present

## 2023-08-09 DIAGNOSIS — G40409 Other generalized epilepsy and epileptic syndromes, not intractable, without status epilepticus: Secondary | ICD-10-CM | POA: Diagnosis not present

## 2023-08-09 DIAGNOSIS — I272 Pulmonary hypertension, unspecified: Secondary | ICD-10-CM | POA: Diagnosis not present

## 2023-08-09 DIAGNOSIS — E43 Unspecified severe protein-calorie malnutrition: Secondary | ICD-10-CM | POA: Insufficient documentation

## 2023-08-09 DIAGNOSIS — J9601 Acute respiratory failure with hypoxia: Secondary | ICD-10-CM | POA: Diagnosis not present

## 2023-08-09 DIAGNOSIS — J9602 Acute respiratory failure with hypercapnia: Secondary | ICD-10-CM | POA: Diagnosis not present

## 2023-08-09 LAB — POCT I-STAT 7, (LYTES, BLD GAS, ICA,H+H)
Acid-Base Excess: 4 mmol/L — ABNORMAL HIGH (ref 0.0–2.0)
Bicarbonate: 25.6 mmol/L (ref 20.0–28.0)
Calcium, Ion: 0.99 mmol/L — ABNORMAL LOW (ref 1.15–1.40)
HCT: 31 % — ABNORMAL LOW (ref 39.0–52.0)
Hemoglobin: 10.5 g/dL — ABNORMAL LOW (ref 13.0–17.0)
O2 Saturation: 95 %
Potassium: 3.9 mmol/L (ref 3.5–5.1)
Sodium: 138 mmol/L (ref 135–145)
TCO2: 26 mmol/L (ref 22–32)
pCO2 arterial: 27.5 mm[Hg] — ABNORMAL LOW (ref 32–48)
pH, Arterial: 7.577 — ABNORMAL HIGH (ref 7.35–7.45)
pO2, Arterial: 61 mm[Hg] — ABNORMAL LOW (ref 83–108)

## 2023-08-09 LAB — CBC
HCT: 32.7 % — ABNORMAL LOW (ref 39.0–52.0)
Hemoglobin: 10.1 g/dL — ABNORMAL LOW (ref 13.0–17.0)
MCH: 27.6 pg (ref 26.0–34.0)
MCHC: 30.9 g/dL (ref 30.0–36.0)
MCV: 89.3 fL (ref 80.0–100.0)
Platelets: 118 10*3/uL — ABNORMAL LOW (ref 150–400)
RBC: 3.66 MIL/uL — ABNORMAL LOW (ref 4.22–5.81)
RDW: 18.6 % — ABNORMAL HIGH (ref 11.5–15.5)
WBC: 8.9 10*3/uL (ref 4.0–10.5)
nRBC: 1.2 % — ABNORMAL HIGH (ref 0.0–0.2)

## 2023-08-09 LAB — LEGIONELLA PNEUMOPHILA SEROGP 1 UR AG: L. pneumophila Serogp 1 Ur Ag: NEGATIVE

## 2023-08-09 LAB — URINE CULTURE: Culture: NO GROWTH

## 2023-08-09 LAB — COMPREHENSIVE METABOLIC PANEL
ALT: 258 U/L — ABNORMAL HIGH (ref 0–44)
AST: 926 U/L — ABNORMAL HIGH (ref 15–41)
Albumin: 2.4 g/dL — ABNORMAL LOW (ref 3.5–5.0)
Alkaline Phosphatase: 59 U/L (ref 38–126)
Anion gap: 14 (ref 5–15)
BUN: 27 mg/dL — ABNORMAL HIGH (ref 8–23)
CO2: 22 mmol/L (ref 22–32)
Calcium: 7.7 mg/dL — ABNORMAL LOW (ref 8.9–10.3)
Chloride: 100 mmol/L (ref 98–111)
Creatinine, Ser: 1.68 mg/dL — ABNORMAL HIGH (ref 0.61–1.24)
GFR, Estimated: 43 mL/min — ABNORMAL LOW (ref >60)
Glucose, Bld: 153 mg/dL — ABNORMAL HIGH (ref 70–99)
Potassium: 3.9 mmol/L (ref 3.5–5.1)
Sodium: 136 mmol/L (ref 135–145)
Total Bilirubin: 1.4 mg/dL — ABNORMAL HIGH (ref 0.0–1.2)
Total Protein: 5.7 g/dL — ABNORMAL LOW (ref 6.5–8.1)

## 2023-08-09 LAB — HEMOGLOBIN A1C
Hgb A1c MFr Bld: 6.7 % — ABNORMAL HIGH (ref 4.8–5.6)
Mean Plasma Glucose: 145.59 mg/dL

## 2023-08-09 LAB — GLUCOSE, CAPILLARY
Glucose-Capillary: 106 mg/dL — ABNORMAL HIGH (ref 70–99)
Glucose-Capillary: 131 mg/dL — ABNORMAL HIGH (ref 70–99)
Glucose-Capillary: 146 mg/dL — ABNORMAL HIGH (ref 70–99)
Glucose-Capillary: 157 mg/dL — ABNORMAL HIGH (ref 70–99)
Glucose-Capillary: 158 mg/dL — ABNORMAL HIGH (ref 70–99)
Glucose-Capillary: 169 mg/dL — ABNORMAL HIGH (ref 70–99)

## 2023-08-09 LAB — PROTIME-INR
INR: 1.5 — ABNORMAL HIGH (ref 0.8–1.2)
Prothrombin Time: 18.7 s — ABNORMAL HIGH (ref 11.4–15.2)

## 2023-08-09 LAB — MAGNESIUM
Magnesium: 1.8 mg/dL (ref 1.7–2.4)
Magnesium: 2.5 mg/dL — ABNORMAL HIGH (ref 1.7–2.4)

## 2023-08-09 LAB — ECHOCARDIOGRAM COMPLETE
AR max vel: 1.72 cm2
AV Area VTI: 1.78 cm2
AV Area mean vel: 1.59 cm2
AV Mean grad: 1 mm[Hg]
AV Peak grad: 1.9 mm[Hg]
Ao pk vel: 0.68 m/s
Area-P 1/2: 4.96 cm2
Height: 67.992 in
MV M vel: 4.59 m/s
MV Peak grad: 84.1 mm[Hg]
MV VTI: 2.08 cm2
S' Lateral: 3.6 cm
Single Plane A4C EF: 73.7 %
Weight: 1555.57 [oz_av]

## 2023-08-09 LAB — CBG MONITORING, ED: Glucose-Capillary: 103 mg/dL — ABNORMAL HIGH (ref 70–99)

## 2023-08-09 LAB — PHOSPHORUS
Phosphorus: 3 mg/dL (ref 2.5–4.6)
Phosphorus: 3.1 mg/dL (ref 2.5–4.6)

## 2023-08-09 LAB — TRIGLYCERIDES: Triglycerides: 69 mg/dL (ref <150)

## 2023-08-09 LAB — LIPASE, BLOOD: Lipase: 18 U/L (ref 11–51)

## 2023-08-09 LAB — PROCALCITONIN: Procalcitonin: 2.04 ng/mL

## 2023-08-09 MED ORDER — MEDIHONEY WOUND/BURN DRESSING EX PSTE
1.0000 | PASTE | Freq: Every day | CUTANEOUS | Status: DC
  Administered 2023-08-09 – 2023-08-15 (×6): 1 via TOPICAL
  Filled 2023-08-09: qty 44

## 2023-08-09 MED ORDER — OSMOLITE 1.5 CAL PO LIQD
1000.0000 mL | ORAL | Status: DC
  Administered 2023-08-09 – 2023-08-15 (×5): 1000 mL
  Filled 2023-08-09 (×8): qty 1000

## 2023-08-09 MED ORDER — PROSOURCE TF20 ENFIT COMPATIBL EN LIQD
60.0000 mL | Freq: Every day | ENTERAL | Status: DC
  Administered 2023-08-09 – 2023-08-15 (×7): 60 mL
  Filled 2023-08-09 (×7): qty 60

## 2023-08-09 MED ORDER — METHYLPREDNISOLONE SODIUM SUCC 40 MG IJ SOLR
40.0000 mg | INTRAMUSCULAR | Status: AC
Start: 2023-08-10 — End: 2023-08-13
  Administered 2023-08-10 – 2023-08-13 (×4): 40 mg via INTRAVENOUS
  Filled 2023-08-09 (×4): qty 1

## 2023-08-09 MED ORDER — FUROSEMIDE 10 MG/ML IJ SOLN
INTRAMUSCULAR | Status: AC
  Administered 2023-08-09: 40 mg via INTRAVENOUS
  Filled 2023-08-09: qty 4

## 2023-08-09 MED ORDER — LORAZEPAM 2 MG/ML IJ SOLN
2.0000 mg | Freq: Once | INTRAMUSCULAR | Status: AC
  Administered 2023-08-09: 2 mg via INTRAVENOUS
  Filled 2023-08-09: qty 1

## 2023-08-09 MED ORDER — FUROSEMIDE 10 MG/ML IJ SOLN: 40.0000 mg | Freq: Once | INTRAMUSCULAR | Status: AC

## 2023-08-09 MED ORDER — MIDAZOLAM-SODIUM CHLORIDE 100-0.9 MG/100ML-% IV SOLN
0.5000 mg/h | INTRAVENOUS | Status: DC
Start: 2023-08-09 — End: 2023-08-12
  Administered 2023-08-09: 4 mg/h via INTRAVENOUS
  Administered 2023-08-11: 2 mg/h via INTRAVENOUS
  Filled 2023-08-09 (×2): qty 100

## 2023-08-09 MED ORDER — LEVETIRACETAM IN NACL 500 MG/100ML IV SOLN
500.0000 mg | Freq: Two times a day (BID) | INTRAVENOUS | Status: DC
  Administered 2023-08-09 – 2023-08-10 (×2): 500 mg via INTRAVENOUS
  Filled 2023-08-09 (×2): qty 100

## 2023-08-09 MED ORDER — MAGNESIUM SULFATE 2 GM/50ML IV SOLN
2.0000 g | Freq: Once | INTRAVENOUS | Status: AC
  Administered 2023-08-09: 2 g via INTRAVENOUS
  Filled 2023-08-09: qty 50

## 2023-08-09 MED ORDER — INSULIN ASPART 100 UNIT/ML IJ SOLN
0.0000 [IU] | INTRAMUSCULAR | Status: DC
  Administered 2023-08-09: 2 [IU] via SUBCUTANEOUS
  Administered 2023-08-09: 3 [IU] via SUBCUTANEOUS
  Administered 2023-08-10: 2 [IU] via SUBCUTANEOUS
  Administered 2023-08-10: 3 [IU] via SUBCUTANEOUS
  Administered 2023-08-10: 5 [IU] via SUBCUTANEOUS
  Administered 2023-08-10 (×2): 3 [IU] via SUBCUTANEOUS
  Administered 2023-08-11 (×2): 2 [IU] via SUBCUTANEOUS
  Administered 2023-08-11 – 2023-08-12 (×3): 3 [IU] via SUBCUTANEOUS
  Administered 2023-08-13 (×4): 5 [IU] via SUBCUTANEOUS
  Administered 2023-08-14: 3 [IU] via SUBCUTANEOUS
  Administered 2023-08-14: 5 [IU] via SUBCUTANEOUS
  Administered 2023-08-14: 3 [IU] via SUBCUTANEOUS
  Administered 2023-08-14 – 2023-08-15 (×3): 2 [IU] via SUBCUTANEOUS
  Administered 2023-08-15: 3 [IU] via SUBCUTANEOUS

## 2023-08-09 MED ORDER — LEVETIRACETAM IN NACL 1000 MG/100ML IV SOLN
1000.0000 mg | Freq: Once | INTRAVENOUS | Status: AC
Start: 2023-08-09 — End: 2023-08-09
  Administered 2023-08-09: 1000 mg via INTRAVENOUS
  Filled 2023-08-09: qty 100

## 2023-08-09 MED ORDER — POTASSIUM CHLORIDE 20 MEQ PO PACK
40.0000 meq | PACK | Freq: Once | ORAL | Status: AC
  Administered 2023-08-09: 40 meq
  Filled 2023-08-09: qty 2

## 2023-08-09 NOTE — Progress Notes (Addendum)
NAME:  Todd [name], MRN:  [id number], DOB:  12-16-1950, LOS: 1 ADMISSION DATE:  08/08/2023, CONSULTATION DATE:  08/08/2023 REFERRING MD:  Dr. Rhunette Croft, CHIEF COMPLAINT:  cardiac arrest   History of Present Illness:  Pt encephalopathic, therefore HPI obtained from EMR.    67 yoM with PMH of tobacco abuse, HTN, atrial fibrillation (not on AC given prior GIBs), pulmonary hypertension, HLD, ETOH abuse w/hx of withdrawal seizures, PAD, hypothyroidism, GERD, chronic pancreatitis, pancreatic duct dilation/ IPMN, and suspected cirrhosis presenting to [facility] ER due to altered mental status.  Family reports ongoing exertional SOB since recent discharge.  Recent hospitalization for 1/30- 2/5 left foot pain with ulcers, AKI, and SOB with anasarca treated with IV diureses;  underwent balloon angioplasty with left SFA stent placement with vascular surgery on 2/3.  Wife reported concerns of AMS, SBP 80's, with shaking but pt had returned to his baseline by EMS arrival and refused transport.  An hour later, pt was in respiratory distress and hypoxic.  By arrival to ER by EMS, HR had dropped to 20's and become pulseless.  ACLS measures provided including CPR, intubation, with ROSC after 15 mins.    ER workup significant for plts 82, AG 18, iCa 0.8, AST/ ALT 271/ 95, BNP 1697 (1769 on 1/30), trop 86, lactic > 9, INR 1.5, CXR showing extensive interstitial opacities favoring pulmonary edema with right small to moderate pleural effusion.  SARS 2 positive.  ABG 7.08/ 66/ 97/ 19.7.  Requiring vasopressor support.  CVL placed.  To be transferred to [facility] for higher level of care, PCCM accepted.   Pertinent  Medical History  Tobacco abuse, HTN, atrial fibrillation (not on AC given prior GIBs), pulmonary hypertension, HLD, ETOH abuse w/hx of withdrawal seizures, PAD, hypothyroidism, GERD, chronic pancreatitis, pancreatic duct dilation/ IPMN, suspected cirrhosis  Significant Hospital Events: Including procedures,  antibiotic start and stop dates in addition to other pertinent events   2/11 admit post arrest, intubated, transfer to [facility] ICU   Interim History / Subjective:  Sedated.   Objective   Blood pressure 108/76, pulse 85, temperature 99.3 F (37.4 C), resp. rate (!) 24, height 5' 7.99" (1.727 m), weight 44.1 kg, SpO2 100%. CVP:  [11 mmHg-14 mmHg] 11 mmHg  Vent Mode: PRVC FiO2 (%):  [60 %-100 %] 100 % Set Rate:  [16 bmp-24 bmp] 24 bmp Vt Set:  [550 mL] 550 mL PEEP:  [5 cmH20-10 cmH20] 10 cmH20 Plateau Pressure:  [18 cmH20-23 cmH20] 23 cmH20   Intake/Output Summary (Last 24 hours) at 08/09/2023 0659 Last data filed at 08/09/2023 0600 Gross per 24 hour  Intake 1201.02 ml  Output 430 ml  Net 771.02 ml   Filed Weights   08/08/23 1224 08/08/23 1653 08/09/23 0600  Weight: 59 kg 44.9 kg 44.1 kg    Examination: General: sedated, in NAD HENT: ETT in place Lungs: on vent  Cardiovascular: irregular rhythm, normal rate Abdomen: hypoactive bowel sounds, soft  Extremities: wounds of b/l LE with dressing in place, noted pitting LE edema  Neuro: sedated GU: foley in place  Resolved Hospital Problem list     Assessment & Plan:  Cardiac arrest- suspect respiratory etiology. With reports of shaking, r/o seizure 2/2 hypoxia, hypoperfusion vs ?ETOH withdrawal seizure - hemodynamic support  - on levophed for goal MAP > 65.  Wean levophed as tolerated - trend CVP - Ucx and Bcx pending  - trend lactic down to 3.5  - check PCT 2.04 - pending echo   Acute hypoxic  and hypercarbic respiratory failure 2/2 to ?ARDS related  SARS 2 positive vs pulmonary edema vs r/o CAP Tobacco abuse - post central line CXR much improved just with positive pressure, suspect pulmonary edema  - full MV support, 4-8cc/kg IBW with goal Pplat <30 and DP<15  - VAP prevention protocol/ PPI - PAD protocol for sedation> versed, prn fentanyl.  RASS goal 0/-1 - adjust peep/ FiO2 as able for SpO2 >90%  - daily SAT & SBT when  appropriate  - aggressive pulmonary hygiene> IS, flutter, mobilize  - bronchodilators > brovana, pulmicort, yupleri and prn albuterol  - solumedrol 40mg  daily  - trach asp, check urine strep/ legionella  - repeat CXR with right pleural effusion and vascular congestion  - consider adding on diuresis    Acute metabolic encephalopathy Hx of ETOH abuse- wife reports pt has not been drinking recently  Hx of withdrawal seizures  - normothermia protocol, avoid fever x 48hrs  - CTH negative for acute intracranial process  - EEG showed myoclonic sz every 10-30 seconds  - neurology consulted, added Keppra and Versed - Seizure precautions   - ammonia 40  - maintain neuro protective measures; goal for eurothermia, euglycemia, eunatermia, normoxia, and PCO2 goal of 35-40 - empiric thiamine  - CBG q4, add SSI prn if > 180, monitor for hypoglycemia   Pulmonary hypertension - s/p RHC 06/12/23  c/w WHO Group III with mild increase in filling pressures w/passive leg raises, normal CO by assumed Fick HTN - hemodynamic support as above - pending echo  - does not appear to be on antihypertensives at home   Afib, permanent  - rate controlled  - not on AC due to prior GIBs, prior cardioversion's failed - optimize electrolytes  - if HR not improved with precedex, will need amio given hypotension.    AKI  - normal renal function in December  -  trend renal indices  - cont foley  - consider renal US/ lytes if worsening  - strict I/Os, daily wts - avoid nephrotoxins, renal dose meds, hemodynamic support as above   Transaminitis Anasarca  AST/ALT significantly elevated. Hx pancreatic duct dilation/ IPMN, chronic pancreatitis - suspect shock liver +/- hepatic congestion, prior concern for cirrhosis on RUQ imaging  - lipase normal  - trend CMP and coags - consider adding diuresis   Chronic normocytic anemia Thrombocytopenia - H/H at baseline.  Recent plt 160>82, stable 118.  Could be related to  possibe liver disease, r/o sepsis.  PTA ASA/ plavix. - trend CBC, transfuse for Hgb < 7   GERD - PPI   Hypothyroidism  TSH 0.967 on 07/04/23 - cont synthroid   Severe PAD prior recent left SFA balloon angioplasty with stent - Holding home ASA/ plavix, can likely restarted if CBC stable    Sacral pressure wound, POA BLE foot wounds Protein-calorie malnutrition - hx of ongoing wt loss, suspect his anasarca is masking weight loss - WOC consult  - RD consult     IV infiltration RUE of vasopressors - phentolamine and elevation per protocol    Poor mobility/ deconditioning  - reports limited mobility for months secondary to PAD, suspect PH, and other factors also contributing  - PT/ OT when appropriate    GOC - wife and children present on admission.  Remains full code for now, aware of MODS and high risk for further deterioration.  Consider PMT.     Best Practice (right click and "Reselect all SmartList Selections" daily)   Diet/type: NPO  DVT prophylaxis prophylactic heparin  Pressure ulcer(s): present on admission  GI prophylaxis: PPI Lines: Central line and Arterial Line Foley:  Yes, and it is still needed Code Status:  full code Last date of multidisciplinary goals of care discussion [updated wife 2/12]  Labs   CBC: Recent Labs  Lab 08/08/23 1220 08/08/23 1742 08/09/23 0603  WBC 4.9  --  8.9  HGB 11.1*  12.2* 11.9* 10.1*  HCT 38.5*  36.0* 35.0* 32.7*  MCV 95.5  --  89.3  PLT 82*  --  118*    Basic Metabolic Panel: Recent Labs  Lab 08/08/23 1220 08/08/23 1742  NA 136  135 137  K 4.4  4.4 5.2*  CL 96*  102  --   CO2 22  --   GLUCOSE 141*  130*  --   BUN 24*  24*  --   CREATININE 1.50*  1.60*  --   CALCIUM 7.6*  --   MG 2.2  --   PHOS 4.6  --    GFR: Estimated Creatinine Clearance: 27.4 mL/min (A) (by C-G formula based on SCr of 1.5 mg/dL (H)). Recent Labs  Lab 08/08/23 1220 08/08/23 1431 08/09/23 0603  PROCALCITON <0.10  --   --   WBC  4.9  --  8.9  LATICACIDVEN >9.0* 8.4*  --     Liver Function Tests: Recent Labs  Lab 08/08/23 1220  AST 271*  ALT 95*  ALKPHOS 65  BILITOT 0.8  PROT 6.3*  ALBUMIN 2.7*   No results for input(s): "LIPASE", "AMYLASE" in the last 168 hours. Recent Labs  Lab 08/08/23 2209  AMMONIA 40*    ABG    Component Value Date/Time   PHART 7.275 (L) 08/08/2023 1742   PCO2ART 55.4 (H) 08/08/2023 1742   PO2ART 55 (L) 08/08/2023 1742   HCO3 25.7 08/08/2023 1742   TCO2 27 08/08/2023 1742   ACIDBASEDEF 2.0 08/08/2023 1742   O2SAT 72.5 08/08/2023 1800     Coagulation Profile: Recent Labs  Lab 08/08/23 1220  INR 1.5*    Cardiac Enzymes: No results for input(s): "CKTOTAL", "CKMB", "CKMBINDEX", "TROPONINI" in the last 168 hours.  HbA1C: No results found for: "HGBA1C"  CBG: Recent Labs  Lab 08/08/23 1649 08/08/23 1704 08/08/23 1956 08/08/23 2359 08/09/23 0401  GLUCAP 27* 117* 115* 169* 146*    Review of Systems:   Unable to obtain. Sedated.  Past Medical History:  He,  has a past medical history of Alcoholism (HCC), Anemia, Atrial fibrillation (HCC), Carotid artery occlusion, Chronic back pain, Colitis, Dysrhythmia, Essential hypertension, Gout, Heart murmur, History of GI bleed, Hypothyroidism, Internal hemorrhoids, Osteoarthritis, Peripheral vascular disease (HCC), and Schatzki's ring.   Surgical History:   Past Surgical History:  Procedure Laterality Date   ABDOMINAL AORTOGRAM N/A 03/08/2019   Procedure: ABDOMINAL AORTOGRAM;  Surgeon: Sherren Kerns, MD;  Location: [facility] INVASIVE CV LAB;  Service: Cardiovascular;  Laterality: N/A;   ABDOMINAL AORTOGRAM W/LOWER EXTREMITY N/A 03/02/2021   Procedure: ABDOMINAL AORTOGRAM W/LOWER EXTREMITY;  Surgeon: Nada Libman, MD;  Location: [facility] INVASIVE CV LAB;  Service: Cardiovascular;  Laterality: N/A;   ABDOMINAL AORTOGRAM W/LOWER EXTREMITY N/A 07/31/2023   Procedure: ABDOMINAL AORTOGRAM W/LOWER EXTREMITY;  Surgeon: Daria Pastures, MD;  Location[facility] INVASIVE CV LAB;  Service: Cardiovascular;  Laterality: N/A;   APPLICATION OF WOUND VAC Right 07/22/2021   Procedure: APPLICATION OF WOUND VAC;  Surgeon: Nada Libman, MD;  Location: [facility] OR;  Service: Vascular;  Laterality: Right;  BIOPSY  10/20/2022   Procedure: BIOPSY;  Surgeon: Lanelle Bal, DO;  Location: [facility] ENDO SUITE;  Service: Endoscopy;;   COLONOSCOPY WITH PROPOFOL N/A 10/20/2022   Procedure: COLONOSCOPY WITH PROPOFOL;  Surgeon: Lanelle Bal, DO;  Location: [facility];  Service: Endoscopy;  Laterality: N/A;   COLONOSCOPY WITH PROPOFOL N/A 12/09/2022   Procedure: COLONOSCOPY WITH PROPOFOL;  Surgeon: Lanelle Bal, DO;  Location: [facility];  Service: Endoscopy;  Laterality: N/A;  10:15 AM, ASA 3   ENDARTERECTOMY Left 02/02/2022   Procedure: LEFT ENDARTERECTOMY CAROTID;  Surgeon: Nada Libman, MD;  Location: [facility] OR;  Service: Vascular;  Laterality: Left;   ESOPHAGOGASTRODUODENOSCOPY (EGD) WITH PROPOFOL N/A 10/20/2022   Procedure: ESOPHAGOGASTRODUODENOSCOPY (EGD) WITH PROPOFOL;  Surgeon: Lanelle Bal, DO;  [facility]: [facility];  Service: Endoscopy;  Laterality: N/A;   ESOPHAGOGASTRODUODENOSCOPY (EGD) WITH PROPOFOL N/A 02/07/2023   Procedure: ESOPHAGOGASTRODUODENOSCOPY (EGD) WITH PROPOFOL;  Surgeon: Meridee Score Netty Starring., MD;  Location: [facility] ENDOSCOPY;  Service: Gastroenterology;  Laterality: N/A;   EUS N/A 02/07/2023   Procedure: UPPER ENDOSCOPIC ULTRASOUND (EUS) RADIAL;  Surgeon: Lemar Lofty., MD;  Location: [facility] ENDOSCOPY;  Service: Gastroenterology;  Laterality: N/A;   Excision of gouty lesion     Left index finger   FEMORAL-POPLITEAL BYPASS GRAFT Right 03/12/2021   Procedure: RIGHT FEMORAL TO POPLITEAL ARTERY BYPASS GRAFTING USING THE PROPATEN GRAFT;  Surgeon: Nada Libman, MD;  Location: [facility] OR;  Service: Vascular;  Laterality: Right;   FINE NEEDLE ASPIRATION N/A 02/07/2023   Procedure: FINE NEEDLE ASPIRATION (FNA) LINEAR;   Surgeon: Lemar Lofty., MD;  Location: [facility];  Service: Gastroenterology;  Laterality: N/A;   GROIN DEBRIDEMENT Right 07/22/2021   Procedure: RIGHT GROIN DEBRIDEMENT;  Surgeon: Nada Libman, MD;  Location[facility] OR;  Service: Vascular;  Laterality: Right;   HOT HEMOSTASIS N/A 02/07/2023   Procedure: HOT HEMOSTASIS (ARGON PLASMA COAGULATION/BICAP);  Surgeon: Lemar Lofty., MD;  Location: [facility];  Service: Gastroenterology;  Laterality: N/A;   INSERTION OF ILIAC STENT Right 03/12/2021   Procedure: INSERTION OF  EXTERNAL ILIAC STENT;  Surgeon: Nada Libman, MD;  Location: [facility] OR;  Service: Vascular;  Laterality: Right;   LOWER EXTREMITY ANGIOGRAM Right 03/12/2021   Procedure: LOWER EXTREMITY ANGIOGRAM;  Surgeon: Nada Libman, MD;  Location: [facility] OR;  Service: Vascular;  Laterality: Right;   LOWER EXTREMITY ANGIOGRAPHY Bilateral 03/08/2019   Procedure: Lower Extremity Angiography;  Surgeon: Sherren Kerns, MD;  Location: [facility] INVASIVE CV LAB;  Service: Cardiovascular;  Laterality: Bilateral;   LOWER EXTREMITY ANGIOGRAPHY N/A 03/15/2021   Procedure: LOWER EXTREMITY ANGIOGRAPHY;  Surgeon: Maeola Harman, MD;  Location: [facility] INVASIVE CV LAB;  Service: Cardiovascular;  Laterality: N/A;   MASS EXCISION Right 07/07/2020   Procedure: EXCISION OF RIGHT FACIAL MASS;  Surgeon: Newman Pies, MD;  Location: [facility];  Service: ENT;  Laterality: Right;   PATCH ANGIOPLASTY Left 02/02/2022   Procedure: PATCH ANGIOPLASTY OF LEFT CAROTID USING XENOSURE BOVINE PERICARDIUM PATCH;  Surgeon: Nada Libman, MD;  Location: [facility];  Service: Vascular;  Laterality: Left;   PERIPHERAL VASCULAR BALLOON ANGIOPLASTY Right 03/02/2021   Procedure: PERIPHERAL VASCULAR BALLOON ANGIOPLASTY;  Surgeon: Nada Libman, MD;  [facility] INVASIVE CV LAB;  Service: Cardiovascular;  Laterality: Right;  external iliac   PERIPHERAL VASCULAR INTERVENTION Bilateral 03/08/2019   Procedure:  PERIPHERAL VASCULAR INTERVENTION;  Surgeon: Sherren Kerns, MD;  Location: [facility] INVASIVE CV LAB;  Service: Cardiovascular;  Laterality: Bilateral;  ext iliac artery stent   PERIPHERAL VASCULAR INTERVENTION Left 03/02/2021   Procedure: PERIPHERAL VASCULAR INTERVENTION;  Surgeon: Nada Libman, MD;  Location: [facility] INVASIVE CV LAB;  Service: Cardiovascular;  Laterality: Left;  external iliac   PERIPHERAL VASCULAR INTERVENTION Right 03/15/2021   Procedure: PERIPHERAL VASCULAR INTERVENTION;  Surgeon: Maeola Harman, MD;  Location: [facility] INVASIVE CV LAB;  Service: Cardiovascular;  Laterality: Right;  external iliac   POLYPECTOMY  12/09/2022   Procedure: POLYPECTOMY INTESTINAL;  Surgeon: Lanelle Bal, DO;  [facility];  Service: Endoscopy;;   RIGHT HEART CATH N/A 06/12/2023   Procedure: RIGHT HEART CATH;  Surgeon: Romie Minus, MD;  Location: [facility] INVASIVE CV LAB;  Service: Cardiovascular;  Laterality: N/A;   Right knee arthroscopy     SPINE SURGERY       Social History:   reports that he has been smoking cigarettes. He has been exposed to tobacco smoke. He has never used smokeless tobacco. He reports current alcohol use of about 5.0 standard drinks of alcohol per week. He reports that he does not use drugs.   Family History:  His family history includes Coronary artery disease in his father and sister.   Allergies No Known Allergies   Home Medications  Prior to Admission medications   Medication Sig Start Date End Date Taking? Authorizing Provider  aspirin EC 81 MG EC tablet Take 1 tablet (81 mg total) by mouth daily at 6 (six) AM. Swallow whole. 03/17/21   Setzer, Lynnell Jude, PA-C  cholecalciferol (VITAMIN D3) 25 MCG (1000 UNIT) tablet Take 1,000 Units by mouth daily.    [provider]  clopidogrel (PLAVIX) 75 MG tablet TAKE ONE TABLET BY MOUTH ONCE DAILY. 05/05/23   Jonelle Sidle, MD  diclofenac Sodium (VOLTAREN) 1 % GEL Apply 4 g topically 4 (four) times  daily as needed (pain). 01/31/23   [provider]  Ensure (ENSURE) Take 1 Can by mouth daily.    [provider]  FARXIGA 10 MG TABS tablet Take 1 tablet (10 mg total) by mouth daily before breakfast. 05/30/23   Romie Minus, MD  furosemide (LASIX) 20 MG tablet Take 1 tablet (20 mg total) by mouth daily. 06/12/23   Romie Minus, MD  levothyroxine (SYNTHROID) 25 MCG tablet Take 25 mcg by mouth daily before breakfast.    [provider]  lipase/protease/amylase (CREON) 36000 UNITS CPEP capsule Take 2 capsules (72,000 Units total) by mouth 3 (three) times daily before meals. And 1 capsule with snacks 03/15/23 03/14/24  Lanelle Bal, DO  pantoprazole (PROTONIX) 40 MG tablet Take 1 tablet (40 mg total) by mouth 2 (two) times daily. 11/03/22 11/03/23  Lanelle Bal, DO  rosuvastatin (CRESTOR) 10 MG tablet TAKE (1) TABLET BY MOUTH ONCE DAILY. 09/09/20   Maeola Harman, MD     Critical care time:

## 2023-08-09 NOTE — Progress Notes (Signed)
Upon 20:00 assessment, OG tube was at 45cm. Previous xray stated to advance, but no evidence it was advanced. OG advanced to 60cm and xray ordered and conformation OG is now in the stomach.

## 2023-08-09 NOTE — Progress Notes (Signed)
EEG complete - results pending

## 2023-08-09 NOTE — Progress Notes (Signed)
Lactic acid drawn 08/08/23 at 22:09 resulted at 3.5. Not crossing over in chart.

## 2023-08-09 NOTE — Progress Notes (Signed)
Initial Nutrition Assessment  DOCUMENTATION CODES:  Underweight, Severe malnutrition in context of social or environmental circumstances  INTERVENTION:  Initiate tube feeding via OGT: Osmolite 1.5 at 45 ml/h (1080 ml per day) Start at 25 and advance by 10mL q12h to goal Prosource TF20 60 ml 1x/d Provides 1700 kcal, 88 gm protein, 823 ml free water daily Monitor magnesium and phosphorus every 12 hours x 4 occurrences, MD to replete as needed, as pt is at risk for refeeding syndrome given underweight BMI. Thiamine 100mg  x 5 days  NUTRITION DIAGNOSIS:  Severe Malnutrition related to social / environmental circumstances (EtOH abuse) as evidenced by severe fat depletion, severe muscle depletion.  GOAL:  Patient will meet greater than or equal to 90% of their needs  MONITOR:  TF tolerance, I & O's, Vent status, Labs, Weight trends  REASON FOR ASSESSMENT:  Consult Enteral/tube feeding initiation and management  ASSESSMENT:  Pt with hx of A-fib, PAD, HTN, HLD, EtOH abuse, GERD, hypothyroidism, chronic pancreatitis, pancreatic duct dilation/ IPMN, and suspected cirrhosis presented to [facility] with respiratory distress and hypoxia requiring CPR and intubation. Positive for COVID19. Transferred to [facility].  2/11 - unresponsive at home, EMS started CPR and transported to [facility] ED, intubated, transferred to [facility]   Patient is currently intubated on ventilator support. Wife and daughter in room at the time of assessment. Reports that pt has never been a big eater but that over the last two weeks his appetite has been extremely poor since his last hospitalization. Significant loss of muscle and fat seen throughout body.   Typical intake: breakfast and dinner, small snacks during the day.   Report that weight has fluctuated a lot recent because of the fluid. Pt still has pitting edema to his BLE.  Will start TF slowly and increase to goal while monitoring for refeeding syndrome.   MV: 12.6  L/min Temp (24hrs), Avg:98.8 F (37.1 C), Min:96.9 F (36.1 C), Max:100 F (37.8 C) MAP (Art Line):  Admit weight: 59 kg  Current weight: 44.1 kg    Intake/Output Summary (Last 24 hours) at 08/09/2023 1443 Last data filed at 08/09/2023 1230 Gross per 24 hour  Intake 1478.97 ml  Output 430 ml  Net 1048.97 ml  Net IO Since Admission: 1,048.97 mL [08/09/23 1443]  Drains/Lines: OGT 18 Fr. ([place] in stomach) CVC Triple Lumen Right IJ Art Line UOP x 24 hours  Nutritionally Relevant Medications: Scheduled Meds:  docusate  100 mg Per Tube BID   methylPREDNISolone   40 mg Intravenous Q12H   pantoprazole IV  40 mg Intravenous QHS   polyethylene glycol  17 g Per Tube Daily   thiamine (VITAMIN B1) injection  100 mg Intravenous Daily   Continuous Infusions:  ceFEPime (MAXIPIME) IV Stopped (08/08/23 1928)   norepinephrine (LEVOPHED) Adult infusion 2 mcg/min (08/09/23 0700)   vancomycin     Labs Reviewed: BUN 27, creatinine 1.68 CBG ranges from 27-169 mg/dL over the last 24 hours  NUTRITION - FOCUSED PHYSICAL EXAM: Flowsheet Row Most Recent Value  Orbital Region Severe depletion  Upper Arm Region Severe depletion  Thoracic and Lumbar Region Severe depletion  Buccal Region Severe depletion  Temple Region Severe depletion  Clavicle Bone Region Severe depletion  Clavicle and Acromion Bone Region Severe depletion  Scapular Bone Region Severe depletion  Dorsal Hand Severe depletion  Patellar Region Severe depletion  Anterior Thigh Region Severe depletion  Posterior Calf Region Moderate depletion  [pitting edema present]  Edema (RD Assessment) Moderate  [  pitting edema to the BLE, mild hands]  Hair Reviewed  Eyes Reviewed  Mouth Reviewed  Skin Reviewed  Nails Reviewed    Diet Order:   Diet Order             Diet NPO time specified  Diet effective now                   EDUCATION NEEDS:  Not appropriate for education at this time  Skin:  Skin  Assessment: Reviewed RN Assessment Skin Tear: - bilateral wrists  Partial Thickness Wounds: - left pretibial region x 3 (1 x 1 cm each) Blister: - left foot (10 x 6 cm) Stage 1: - sacrum  Last BM:  PTA  Height:  Ht Readings from Last 1 Encounters:  08/08/23 5' 7.99" (1.727 m)    Weight:  Wt Readings from Last 1 Encounters:  08/09/23 44.1 kg    Ideal Body Weight:  70 kg  BMI:  Body mass index is 14.79 kg/m.  Estimated Nutritional Needs:  Kcal:  1600-1800 kcal/d Protein:  80-100g/d Fluid:  1.8-2L/d    Greig Castilla, RD, LDN Registered Dietitian II Please reach out via secure chat Weekend on-call pager # available in [facility]

## 2023-08-09 NOTE — Progress Notes (Signed)
  Echocardiogram 2D Echocardiogram has been performed.  Ocie Doyne RDCS 08/09/2023, 1:52 PM

## 2023-08-09 NOTE — Consult Note (Signed)
WOC Nurse Consult Note: Reason for Consult: multiple wounds Wound type: Skin tear; left wrist; unroofed, bloody Skin tear; right wrist; unroofed; bloody Partial thickness ulcerations x 3 left pretibial regionl 100% fibrinous yellow Unroofed blister; partial thickness wound; left dorsal foot Stage 1 Pressure injury; sacrum; intact non blanchable skin  Pressure Injury POA: Yes Measurement: See nursing flow sheets Wound bed: see above  Drainage (amount, consistency, odor) sanguinous from the wrist, serosanguinous foot and LEs  Periwound: ecchymosis over UE Dressing procedure/placement/frequency: Cleanse LE wounds with saline, pat dry  Cover left dorsal foot with single layer of xeroform, top with gauze or foam based on status of patient's periwound skin  Cover RUE and LUE skin tears with single layer of xeroform gauze, cover with kerlix. No tape on UEs.   Change every 3 days and PRN soilage.  Cleanse left pretibial wounds with saline, pat dry Apply 1/4" thick layer of leptospermum honey to wound beds,, top with silicone foam reapply Medihoney  daily. Ok to lift silicone foam to reapply Medihoney daily.  Low air loss mattress while in the ICU for moisture management and pressure redistribution  Monitor skin status Q shift for acute changes.   Re consult if needed, will not follow at this time. Thanks  Kendre Jacinto [employer], RN,CWOCN, CNS, CWON-[facility] [phone])

## 2023-08-09 NOTE — Procedures (Signed)
Patient Name: Todd [name]  MRN: [id number]  Epilepsy Attending: Charlsie Quest  Referring Physician/Provider: Norton Blizzard, NP  Date: 08/09/2023 Duration: 23.52 mins  Patient history: 73 year old male status post cardiac arrest.  EEG to evaluate for seizure.  Level of alertness: comatose  AEDs during EEG study: None  Technical aspects: This EEG study was done with scalp electrodes positioned according to the 10-20 International system of electrode placement. Electrical activity was reviewed with band pass filter of 1-70Hz , sensitivity of 7 uV/mm, display speed of 51mm/sec with a 60Hz  notched filter applied as appropriate. EEG data were recorded continuously and digitally stored.  Video monitoring was not available due to technical issues  Description: EEG showed near continuous generalized low amplitude 3 to 5 Hz theta- delta slowing.  Quasi periodic high amplitude sharply contoured delta waves were also noted every 1 to 4 seconds.  Additionally generalized spikes were noted every 3 to 5 seconds in the beginning of the study.  Per EEG tech and location patient was noted to be jerking intermittently during the study (unfortunately video was not available for the study).  Hyperventilation and photic stimulation were not performed.     ABNORMALITY -Spikes, generalized -Continuous slow, generalized  IMPRESSION: This study showed evidence of epileptogenicity with generalized onset.  Per tech annotation, patient was noted to be jerking intermittently during the study.  Unfortunately, video was not available.  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  Additionally there is severe diffuse encephalopathy.  Dr. Francine Graven was notified and video EEG was recommended.  Kourtnei Rauber Annabelle Harman

## 2023-08-09 NOTE — Consult Note (Signed)
NEUROLOGY CONSULT NOTE   Date of service: August 09, 2023 Patient Name: Todd [name] MRN:  [id number] DOB:  Jun 20, 1951 Chief Complaint: "cardiac arrest, myoclonic jerks" Requesting Provider: Martina Sinner, MD  History of Present Illness  Todd [name] is a 73 y.o. male with hx of afibb not on AC due to GI Bleed, EtOh abuse but quit drinking, prior hx of EtOh withdrawal seizure x 1, PAD, current everyday smoker who had a episode of confusion, shortness of breath, gasping and shaking and was back to baseline by the time EMS arrived. He was offered but refused transport to the hospital. About an hour later, short of breath, EMS arrived and brought him to hospital. Enroute, HR dropped and he became pulseless just asthey got to the hospital. CPR initiated and 15 mins of CPR before ROSC. He was intubated in the ED.  He was admitted to [facility] ICU. 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  Daughter reports just 1 seizure in the past about 4 years ago that occurred2-3 days after he quit drinking alcohol. Never had another seizure since.  He was fine all day prior to this and no issues the day before. He has chronic cough. He goes through a pack of cigarettes in 2 days.  He is COVID positive. CT Head w/o contrast with no acute abnormalities.  Patient is unable to provide any history secondary to intubation and sedation on precedex.   ROS  Unable to ascertain due to intubation and sedation.  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   History of GI bleed    Hypothyroidism     Internal hemorrhoids    Colonoscopy 11/09   Osteoarthritis    Peripheral vascular disease (HCC)    Schatzki's ring    EGD 11/09    Past Surgical History:  Procedure Laterality Date   ABDOMINAL AORTOGRAM N/A 03/08/2019   Procedure: ABDOMINAL AORTOGRAM;  Surgeon: Sherren Kerns, MD;  [facility] IN[facility];  Service: Cardiovascular;  Laterality: N/A;   ABDOMINAL AORTOGRAM W/LOWER EXTREMITY N/A 03/02/2021   Procedure: ABDOMINAL AORTOGRAM W/LOWER EXTREMITY;  Surgeon: Nada Libman, MD;  [facility]: [facility];  Service: Cardiovascular;  Laterality: N/A;   ABDOMINAL AORTOGRAM W/LOWER EXTREMITY N/A 07/31/2023   Procedure: ABDOMINAL AORTOGRAM W/LOWER EXTREMITY;  Surgeon: Todd Pastures, MD;  [facility] INVASIVE CV LAB;  Service: Cardiovascular;  Laterality: N/A;   APPLICATION OF WOUND VAC Right 07/22/2021   Procedure: APPLICATION OF WOUND VAC;  Surgeon: Nada Libman, MD;  Location: [facility] OR;  Service: Vascular;  Laterality: Right;   BIOPSY  10/20/2022   Procedure: BIOPSY;  Surgeon: Lanelle Bal, DO;  [facility]: [facility];  Service: Endoscopy;;   COLONOSCOPY WITH PROPOFOL N/A 10/20/2022   Procedure: COLONOSCOPY WITH PROPOFOL;  Surgeon: Lanelle Bal, DO;  [facility];  Service: Endoscopy;  Laterality: N/A;   COLONOSCOPY WITH PROPOFOL N/A 12/09/2022   Procedure: COLONOSCOPY  WITH PROPOFOL;  Surgeon: Lanelle Bal, DO;  [facility];  Service: Endoscopy;  Laterality: N/A;  10:15 AM, ASA 3   ENDARTERECTOMY Left 02/02/2022   Procedure: LEFT ENDARTERECTOMY CAROTID;  Surgeon: Nada Libman, MD;  [facility]: [facility] OR;  Service: Vascular;  Laterality: Left;   ESOPHAGOGASTRODUODENOSCOPY (EGD) WITH PROPOFOL N/A 10/20/2022   Procedure: ESOPHAGOGASTRODUODENOSCOPY (EGD) WITH PROPOFOL;  Surgeon: Lanelle Bal, DO;  [facility];  Service: Endoscopy;  Laterality: N/A;   ESOPHAGOGASTRODUODENOSCOPY (EGD) WITH PROPOFOL N/A 02/07/2023   Procedure:  ESOPHAGOGASTRODUODENOSCOPY (EGD) WITH PROPOFOL;  Surgeon: Meridee Score Netty Starring., MD;  Location: [facility] ENDOSCOPY;  Service: Gastroenterology;  Laterality: N/A;   EUS N/A 02/07/2023   Procedure: UPPER ENDOSCOPIC ULTRASOUND (EUS) RADIAL;  Surgeon: Lemar Lofty., MD;  Location: [facility] ENDOSCOPY;  Service: Gastroenterology;  Laterality: N/A;   Excision of gouty lesion     Left index finger   FEMORAL-POPLITEAL BYPASS GRAFT Right 03/12/2021   Procedure: RIGHT FEMORAL TO POPLITEAL ARTERY BYPASS GRAFTING USING THE PROPATEN GRAFT;  Surgeon: Nada Libman, MD;  [facility];  Service: Vascular;  Laterality: Right;   FINE NEEDLE ASPIRATION N/A 02/07/2023   Procedure: FINE NEEDLE ASPIRATION (FNA) LINEAR;  Surgeon: Lemar Lofty., MD;  Location: [facility];  Service: Gastroenterology;  Laterality: N/A;   GROIN DEBRIDEMENT Right 07/22/2021   Procedure: RIGHT GROIN DEBRIDEMENT;  Surgeon: Nada Libman, MD;  Location: [facility] OR;  Service: Vascular;  Laterality: Right;   HOT HEMOSTASIS N/A 02/07/2023   Procedure: HOT HEMOSTASIS (ARGON PLASMA COAGULATION/BICAP);  Surgeon: Lemar Lofty., MD;  Location: [facility];  Service: Gastroenterology;  Laterality: N/A;   INSERTION OF ILIAC STENT Right 03/12/2021   Procedure: INSERTION OF  EXTERNAL ILIAC STENT;  Surgeon: Nada Libman, MD;  [facility];  Service: Vascular;  Laterality: Right;   LOWER EXTREMITY ANGIOGRAM Right 03/12/2021   Procedure: LOWER EXTREMITY ANGIOGRAM;  Surgeon: Nada Libman, MD;  [facility];  Service: Vascular;  Laterality: Right;   LOWER EXTREMITY ANGIOGRAPHY Bilateral 03/08/2019   Procedure: Lower Extremity Angiography;  Surgeon: Sherren Kerns, MD;  Location: [facility] INVASIVE CV LAB;  Service: Cardiovascular;  Laterality: Bilateral;   LOWER EXTREMITY ANGIOGRAPHY N/A 03/15/2021   Procedure: LOWER EXTREMITY ANGIOGRAPHY;  Surgeon: Maeola Harman, MD;  Location: [facility] INVASIVE CV LAB;  Service:  Cardiovascular;  Laterality: N/A;   MASS EXCISION Right 07/07/2020   Procedure: EXCISION OF RIGHT FACIAL MASS;  Surgeon: Newman Pies, MD;  Location: [facility];  Service: ENT;  Laterality: Right;   PATCH ANGIOPLASTY Left 02/02/2022   Procedure: PATCH ANGIOPLASTY OF LEFT CAROTID USING XENOSURE BOVINE PERICARDIUM PATCH;  Surgeon: Nada Libman, MD;  [facility];  Service: Vascular;  Laterality: Left;   PERIPHERAL VASCULAR BALLOON ANGIOPLASTY Right 03/02/2021   Procedure: PERIPHERAL VASCULAR BALLOON ANGIOPLASTY;  Surgeon: Nada Libman, MD;  [facility];  Service: Cardiovascular;  Laterality: Right;  external iliac   PERIPHERAL VASCULAR INTERVENTION Bilateral 03/08/2019   Procedure: PERIPHERAL VASCULAR INTERVENTION;  Surgeon: Sherren Kerns, MD;  [facility];  Service: Cardiovascular;  Laterality: Bilateral;  ext iliac artery stent   PERIPHERAL VASCULAR INTERVENTION Left 03/02/2021   Procedure: PERIPHERAL VASCULAR INTERVENTION;  Surgeon: Nada Libman, MD;  [facility];  Service: Cardiovascular;  Laterality: Left;  external iliac   PERIPHERAL VASCULAR INTERVENTION Right 03/15/2021   Procedure: PERIPHERAL VASCULAR INTERVENTION;  Surgeon: Maeola Harman, MD;  [facility];  Service: Cardiovascular;  Laterality: Right;  external iliac   POLYPECTOMY  12/09/2022   Procedure: POLYPECTOMY INTESTINAL;  Surgeon: Lanelle Bal, DO;  [facility];  Service: Endoscopy;;   RIGHT HEART CATH N/A 06/12/2023   Procedure: RIGHT HEART CATH;  Surgeon: Romie Minus, MD;  Location: [facility] INVASIVE CV LAB;  Service: Cardiovascular;  Laterality: N/A;   Right knee arthroscopy     SPINE SURGERY      Family History: Family History  Problem Relation Age of Onset   Coronary artery disease Father    Coronary artery disease Sister        MI at age 68    Social History  reports that he has been smoking  cigarettes. He has been exposed to tobacco smoke. He has never used smokeless tobacco. He reports current alcohol use of about 5.0 standard drinks of alcohol per week. He reports that he does not use drugs.  No Known Allergies  Medications   Current Facility-Administered Medications:    albuterol (PROVENTIL) (2.5 MG/3ML) 0.083% nebulizer solution 2.5 mg, 2.5 mg, Nebulization, Q4H PRN, Selmer Dominion B, NP   arformoterol (BROVANA) nebulizer solution 15 mcg, 15 mcg, Nebulization, BID, Selmer Dominion B, NP, 15 mcg at 08/09/23 0847   budesonide (PULMICORT) nebulizer solution 0.5 mg, 0.5 mg, Nebulization, BID, Selmer Dominion B, NP, 0.5 mg at 08/09/23 0847   ceFEPIme (MAXIPIME) 2 g in sodium chloride 0.9 % 100 mL IVPB, 2 g, Intravenous, Q24H, Daylene Posey, RPH, Stopped at 08/08/23 1928   Chlorhexidine Gluconate Cloth 2 % PADS 6 each, 6 each, Topical, Daily, Nanavati, Ankit, MD, 6 each at 08/09/23 1100   docusate (COLACE) 50 MG/5ML liquid 100 mg, 100 mg, Per Tube, BID, Selmer Dominion B, NP, 100 mg at 08/09/23 1100   feeding supplement (OSMOLITE 1.5 CAL) liquid 1,000 mL, 1,000 mL, Per Tube, Continuous, Todd Sinner, MD   feeding supplement (PROSource TF20) liquid 60 mL, 60 mL, Per Tube, Daily, Todd Sinner, MD, 60 mL at 08/09/23 1630   fentaNYL (SUBLIMAZE) injection 25-100 mcg, 25-100 mcg, Intravenous, Q30 min PRN, Selmer Dominion B, NP, 50 mcg at 08/09/23 0543   heparin injection 5,000 Units, 5,000 Units, Subcutaneous, Q8H, Simpson, Gunnar Fusi B, NP, 5,000 Units at 08/09/23 1500   insulin aspart (novoLOG) injection 0-15 Units, 0-15 Units, Subcutaneous, Q4H, Dewald, Bettina Gavia, MD   leptospermum manuka honey (MEDIHONEY) paste 1 Application, 1 Application, Topical, Daily, Francine Graven, Bettina Gavia, MD   levETIRAcetam (KEPPRA) IVPB 500 mg/100 mL premix, 500 mg, Intravenous, BID, Erick Blinks, MD   Melene Muller ON 08/10/2023] methylPREDNISolone sodium succinate (SOLU-MEDROL) 40 mg/mL injection 40 mg, 40  mg, Intravenous, Q24H, Dewald, Bettina Gavia, MD   midazolam (VERSED) 100 mg/100 mL (1 mg/mL) premix infusion, 0.5-10 mg/hr, Intravenous, Continuous, Melody Comas B, MD, Last Rate: 0.5 mL/hr at 08/09/23 1615, 0.5 mg/hr at 08/09/23 1615   midazolam (VERSED) injection 2 mg, 2 mg, Intravenous, Q2H PRN, Selmer Dominion B, NP   norepinephrine (LEVOPHED) 4mg  in (0.016 mg/mL) premix infusion, 0-40 mcg/min, Intravenous, Titrated, Selmer Dominion B, NP, Last Rate: 7.5 mL/hr at 08/09/23 1230, 2 mcg/min at 08/09/23 1230   Oral care mouth rinse, 15 mL, Mouth Rinse, Q2H, Simpson, Paula B, NP, 15 mL at 08/09/23 1600   Oral care mouth rinse, 15 mL, Mouth Rinse, PRN, Selmer Dominion B, NP   pantoprazole (PROTONIX) injection 40 mg, 40 mg, Intravenous, QHS, Selmer Dominion B, NP, 40 mg at 08/08/23 2153  polyethylene glycol (MIRALAX / GLYCOLAX) packet 17 g, 17 g, Per Tube, Daily, Selmer Dominion B, NP, 17 g at 08/09/23 1100   potassium chloride (KLOR-CON) packet 40 mEq, 40 mEq, Per Tube, Once, Dewald, Bettina Gavia, MD   revefenacin (YUPELRI) nebulizer solution 175 mcg, 175 mcg, Nebulization, Daily, Selmer Dominion B, NP, 175 mcg at 08/09/23 0847   thiamine (VITAMIN B1) injection 100 mg, 100 mg, Intravenous, Daily, Selmer Dominion B, NP, 100 mg at 08/09/23 1100  Vitals   Vitals:   08/09/23 1523 08/09/23 1530 08/09/23 1600 08/09/23 1630  BP:   97/69   Pulse:    83  Resp: 16 10 16 15   Temp: 97.9 F (36.6 C) 97.9 F (36.6 C) 97.7 F (36.5 C) (!) 97.5 F (36.4 C)  TempSrc:      SpO2: 100%   100%  Weight:      Height:        Body mass index is 14.79 kg/m.  Physical Exam   General: Laying comfortably in bed; in no acute distress. Appears cachectic HENT: Normal oropharynx and mucosa. Normal external appearance of ears and nose.  Neck: Supple, no pain or tenderness  CV: No JVD. No peripheral edema.  Pulmonary: Symmetric Chest rise. Normal respiratory effort. Breathing over vent. Abdomen: Soft to touch,  non-tender.  Ext: No cyanosis, edema, or deformity  Skin: No rash. Normal palpation of skin.   Musculoskeletal: Normal digits and nails by inspection. No clubbing.   Neurologic Examination  Mental status/Cognition: no response to loud voice or clap. Noted brief full body myoclonic jerk to tactile stimulation. Myoclonic jerks confound much of the exam. Speech/language: mute, no speech, no attempts to communicate. Cranial nerves:   CN II Pupils equal and reactive to light, unable to assess for VF deficits.   CN [name],IV,VI Unable to do dolls eyes due to myoclonic jerks   CN V Corneals intact BL   CN VII No facial grimace noted to noxious stimuli   CN VIII Wife reports he is hard of hearing. Does not make eye contact to loud speech   CN IX & X Cough intact   CN XI Head midline   CN XII midline tongue but does not protrude on command.   Sensory/Motor:  Muscle bulk: poor, tone is flaccid. No purposeful movement noted in any of the extremities. Dose have non-purposeful myoclonic jerks intermittently. No response to proximal pinch in any of the extremities.  Coordination/Complex Motor:  - Unable to assess.  Labs/Imaging/Neurodiagnostic studies   CBC:  Recent Labs  Lab 2023/09/01 1220 2023/09/01 1742 08/09/23 0603 08/09/23 1108  WBC 4.9  --  8.9  --   HGB 11.1*  12.2*   < > 10.1* 10.5*  HCT 38.5*  36.0*   < > 32.7* 31.0*  MCV 95.5  --  89.3  --   PLT 82*  --  118*  --    < > = values in this interval not displayed.   Basic Metabolic Panel:  Lab Results  Component Value Date   NA 138 08/09/2023   K 3.9 08/09/2023   CO2 22 08/09/2023   GLUCOSE 153 (H) 08/09/2023   BUN 27 (H) 08/09/2023   CREATININE 1.68 (H) 08/09/2023   CALCIUM 7.7 (L) 08/09/2023   GFRNONAA 43 (L) 08/09/2023   GFRAA  10/06/2010    >60        The eGFR has been calculated using the MDRD equation. This calculation has not been validated in all clinical situations.  eGFR's persistently <60 mL/min  signify possible Chronic Kidney Disease.   Lipid Panel:  Lab Results  Component Value Date   LDLCALC 39 02/03/2022   HgbA1c: No results found for: "HGBA1C" Urine Drug Screen:     Component Value Date/Time   LABOPIA NONE DETECTED 08/08/2023 1257   COCAINSCRNUR NONE DETECTED 08/08/2023 1257   LABBENZ NONE DETECTED 08/08/2023 1257   AMPHETMU NONE DETECTED 08/08/2023 1257   THCU NONE DETECTED 08/08/2023 1257   LABBARB NONE DETECTED 08/08/2023 1257    Alcohol Level     Component Value Date/Time   [facility]  05/26/2010 1427    <5        LOWEST DETECTABLE LIMIT FOR SERUM ALCOHOL IS 5 mg/dL FOR MEDICAL PURPOSES ONLY   INR  Lab Results  Component Value Date   INR 1.5 (H) 08/08/2023   APTT  Lab Results  Component Value Date   APTT 58 (H) 08/08/2023   AED levels: No results found for: "PHENYTOIN", "ZONISAMIDE", "LAMOTRIGINE", "LEVETIRACETA"  CT Head without contrast(Personally reviewed): CTH was negative for a large hypodensity concerning for a large territory infarct or hyperdensity concerning for an ICH  MRI Brain: pending  Neurodiagnostics rEEG:  This study showed evidence of epileptogenicity with generalized onset.  Per tech annotation, patient was noted to be jerking intermittently during the study.  Unfortunately, video was not available.  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  Additionally there is severe diffuse encephalopathy.   ASSESSMENT   Keano Guggenheim [name] is a 73 y.o. male admitted to ICU after PEA arrest with high quality CPR with ROSC in 15 mins. Etiology of cardiac arrest is unclear, there is concern that he had an episode of shaking an hour prior to this which could have been a ?seizure. On exam, has spontaneous myoclonic jerks that seem to become more frequent with stimulation. 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 Myoclonic jerks confound detailed neuro  exam.  Plan is to get myoclonic seizure under control prior to neurologic prognostication. Prognostication is indeterminate at this time.  RECOMMENDATIONS  - Ativan 2mg  Iv once - Keppra 1000mg  IV once(20mg /kg), followed by Keppra 500mg  BID - Precedex has no utility in seizure control. So we switched him to Versed. However, cEEG showed good seizure control and Versed paused for now. Opted against propofol given periodic hypotension noted in the chart. Next step for seizure management if needed would be Valproic acid 20mg /Kg. - MRI Brain between day 3-5 - avoid hypotension, hyperthermia and hyponatremia. - treatment of other underlying medical conditions and electrolyte imbalance per primary team. - we will continue to follow along.  ______________________________________________________________________  This is a late entry note. Recommendations were communicated to Dr. Charolett Bumpers and Dr. Sherrilee Gilles earlier in the day.  I also spoke with patient's wife and daughter in detail at the bedside.  This patient is critically ill and at significant risk of neurological worsening, death and care requires constant monitoring of vital signs, hemodynamics,respiratory and cardiac monitoring, neurological assessment, discussion with family, other specialists and medical decision making of high complexity. I spent 40 minutes of neurocritical care time  in the care of  this patient. This was time spent independent of any time provided by nurse practitioner or PA.  Erick Blinks [facility] 08/09/2023  6:17 PM  Signed, Erick Blinks, MD [facility]

## 2023-08-10 DIAGNOSIS — I469 Cardiac arrest, cause unspecified: Secondary | ICD-10-CM | POA: Diagnosis not present

## 2023-08-10 DIAGNOSIS — U071 COVID-19: Secondary | ICD-10-CM

## 2023-08-10 DIAGNOSIS — I272 Pulmonary hypertension, unspecified: Secondary | ICD-10-CM | POA: Diagnosis not present

## 2023-08-10 DIAGNOSIS — R569 Unspecified convulsions: Secondary | ICD-10-CM | POA: Diagnosis not present

## 2023-08-10 DIAGNOSIS — J9601 Acute respiratory failure with hypoxia: Secondary | ICD-10-CM | POA: Diagnosis not present

## 2023-08-10 LAB — GLUCOSE, CAPILLARY
Glucose-Capillary: 101 mg/dL — ABNORMAL HIGH (ref 70–99)
Glucose-Capillary: 116 mg/dL — ABNORMAL HIGH (ref 70–99)
Glucose-Capillary: 139 mg/dL — ABNORMAL HIGH (ref 70–99)
Glucose-Capillary: 154 mg/dL — ABNORMAL HIGH (ref 70–99)
Glucose-Capillary: 163 mg/dL — ABNORMAL HIGH (ref 70–99)
Glucose-Capillary: 164 mg/dL — ABNORMAL HIGH (ref 70–99)
Glucose-Capillary: 206 mg/dL — ABNORMAL HIGH (ref 70–99)

## 2023-08-10 LAB — COMPREHENSIVE METABOLIC PANEL
ALT: 234 U/L — ABNORMAL HIGH (ref 0–44)
AST: 564 U/L — ABNORMAL HIGH (ref 15–41)
Albumin: 2.1 g/dL — ABNORMAL LOW (ref 3.5–5.0)
Alkaline Phosphatase: 67 U/L (ref 38–126)
Anion gap: 14 (ref 5–15)
BUN: 34 mg/dL — ABNORMAL HIGH (ref 8–23)
CO2: 23 mmol/L (ref 22–32)
Calcium: 7.7 mg/dL — ABNORMAL LOW (ref 8.9–10.3)
Chloride: 101 mmol/L (ref 98–111)
Creatinine, Ser: 1.66 mg/dL — ABNORMAL HIGH (ref 0.61–1.24)
GFR, Estimated: 43 mL/min — ABNORMAL LOW (ref >60)
Glucose, Bld: 219 mg/dL — ABNORMAL HIGH (ref 70–99)
Potassium: 3.7 mmol/L (ref 3.5–5.1)
Sodium: 138 mmol/L (ref 135–145)
Total Bilirubin: 1.3 mg/dL — ABNORMAL HIGH (ref 0.0–1.2)
Total Protein: 5.1 g/dL — ABNORMAL LOW (ref 6.5–8.1)

## 2023-08-10 LAB — CBC
HCT: 36 % — ABNORMAL LOW (ref 39.0–52.0)
HCT: 35.9 % — ABNORMAL LOW (ref 39.0–52.0)
Hemoglobin: 11.5 g/dL — ABNORMAL LOW (ref 13.0–17.0)
Hemoglobin: 11.4 g/dL — ABNORMAL LOW (ref 13.0–17.0)
MCH: 28 pg (ref 26.0–34.0)
MCH: 27.7 pg (ref 26.0–34.0)
MCHC: 31.9 g/dL (ref 30.0–36.0)
MCHC: 31.8 g/dL (ref 30.0–36.0)
MCV: 87.6 fL (ref 80.0–100.0)
MCV: 87.3 fL (ref 80.0–100.0)
Platelets: 129 10*3/uL — ABNORMAL LOW (ref 150–400)
Platelets: 111 10*3/uL — ABNORMAL LOW (ref 150–400)
RBC: 4.11 MIL/uL — ABNORMAL LOW (ref 4.22–5.81)
RBC: 4.11 MIL/uL — ABNORMAL LOW (ref 4.22–5.81)
RDW: 19.2 % — ABNORMAL HIGH (ref 11.5–15.5)
RDW: 19.2 % — ABNORMAL HIGH (ref 11.5–15.5)
WBC: 11.5 10*3/uL — ABNORMAL HIGH (ref 4.0–10.5)
WBC: 13 10*3/uL — ABNORMAL HIGH (ref 4.0–10.5)
nRBC: 3.6 % — ABNORMAL HIGH (ref 0.0–0.2)
nRBC: 3.1 % — ABNORMAL HIGH (ref 0.0–0.2)

## 2023-08-10 LAB — MAGNESIUM: Magnesium: 2.2 mg/dL (ref 1.7–2.4)

## 2023-08-10 LAB — PHOSPHORUS: Phosphorus: 2.4 mg/dL — ABNORMAL LOW (ref 2.5–4.6)

## 2023-08-10 LAB — STREP PNEUMONIAE URINARY ANTIGEN: Strep Pneumo Urinary Antigen: NEGATIVE

## 2023-08-10 MED ORDER — CLONAZEPAM 1 MG PO TABS
1.0000 mg | ORAL_TABLET | Freq: Three times a day (TID) | ORAL | Status: DC
  Administered 2023-08-10 – 2023-08-14 (×13): 1 mg
  Filled 2023-08-10 (×13): qty 1

## 2023-08-10 MED ORDER — VALPROATE SODIUM 100 MG/ML IV SOLN
1500.0000 mg | INTRAVENOUS | Status: AC
  Administered 2023-08-10: 1500 mg via INTRAVENOUS
  Filled 2023-08-10: qty 15

## 2023-08-10 MED ORDER — FUROSEMIDE 10 MG/ML IJ SOLN
40.0000 mg | Freq: Once | INTRAMUSCULAR | Status: AC
  Administered 2023-08-10: 40 mg via INTRAVENOUS
  Filled 2023-08-10: qty 4

## 2023-08-10 MED ORDER — SODIUM CHLORIDE 0.9 % IV SOLN
750.0000 mg | Freq: Two times a day (BID) | INTRAVENOUS | Status: DC
  Administered 2023-08-10 – 2023-08-14 (×8): 750 mg via INTRAVENOUS
  Filled 2023-08-10 (×9): qty 7.5

## 2023-08-10 MED ORDER — MIDAZOLAM BOLUS VIA INFUSION
10.0000 mg | Freq: Once | INTRAVENOUS | Status: AC
  Administered 2023-08-10: 10 mg via INTRAVENOUS

## 2023-08-10 MED ORDER — LACTULOSE 10 GM/15ML PO SOLN
20.0000 g | Freq: Three times a day (TID) | ORAL | Status: DC
  Administered 2023-08-10 – 2023-08-13 (×11): 20 g
  Filled 2023-08-10 (×11): qty 30

## 2023-08-10 NOTE — Progress Notes (Signed)
Subjective: Has had intermittent seizures overnight.  Wife, son and daughter at bedside.  ROS: Unable to obtain due to poor mental status  Examination  Vital signs in last 24 hours: Temp:  [95.7 F (35.4 C)-100 F (37.8 C)] 98.4 F (36.9 C) (02/13 1015) Pulse Rate:  [67-98] 89 (02/13 0930) Resp:  [0-24] 24 (02/13 1015) BP: (81-112)/(64-81) 81/64 (02/13 1000) SpO2:  [78 %-100 %] 100 % (02/13 0930) Arterial Line BP: (79-133)/(44-80) 94/61 (02/13 1015) FiO2 (%):  [70 %-80 %] 70 % (02/13 0828)  General: lying in bed, intubated Extremities: Bluish discoloration of right toes Neuro: On Versed at 2 mL/h, does not open eyes to stimuli, does not follow commands, pupils equally round reactive to light, no forced gaze deviation, corneal reflex intact, cough reflex absent, does not withdraw to noxious stimuli in all extremities except maybe subtle withdrawal in left lower extremity  Basic Metabolic Panel: Recent Labs  Lab 08/08/23 1220 08/08/23 1742 08/09/23 0603 08/09/23 1108 08/09/23 1700 08/09/23 2039 08/10/23 0420  NA 136  135 137 136 138  --   --  138  K 4.4  4.4 5.2* 3.9 3.9  --   --  3.7  CL 96*  102  --  100  --   --   --  101  CO2 22  --  22  --   --   --  23  GLUCOSE 141*  130*  --  153*  --   --   --  219*  BUN 24*  24*  --  27*  --   --   --  34*  CREATININE 1.50*  1.60*  --  1.68*  --   --   --  1.66*  CALCIUM 7.6*  --  7.7*  --   --   --  7.7*  MG 2.2  --  1.8  --  2.5*  --  2.2  PHOS 4.6  --   --   --  3.0 3.1 2.4*    CBC: Recent Labs  Lab 08/08/23 1220 08/08/23 1742 08/09/23 0603 08/09/23 1108 08/10/23 0420  WBC 4.9  --  8.9  --  11.5*  HGB 11.1*  12.2* 11.9* 10.1* 10.5* 11.5*  HCT 38.5*  36.0* 35.0* 32.7* 31.0* 36.0*  MCV 95.5  --  89.3  --  87.6  PLT 82*  --  118*  --  129*     Coagulation Studies: Recent Labs    08/08/23 1220 08/09/23 2039  LABPROT 18.6* 18.7*  INR 1.5* 1.5*    Imaging personally reviewed  CT head without contrast  08/08/2023: No acute abnormality.Chronic small vessel ischemic changes are seen within the bilateral periventricular white matter. Areas of encephalomalacia are seen within the bilateral frontal lobes, left greater than right, compatible with prior infarcts.   ASSESSMENT AND PLAN: 73 year old male admitted to ICU after PEA arrest with high-quality CPR with ROSC in 15 minutes.  Cardiac arrest Transaminitis -Patient has had myoclonic seizures, worse with stimulation  Recommendations Continue Versed at 4 mL/h.  Okay to increase if migraine seizures become more frequent -Will start Klonopin 1 mg 3 times daily -Will increase Keppra to 750 mg twice daily (maximal dose for current renal function) -Has mild transaminitis likely secondary to cardiac arrest which appears to be improving.  Therefore can consider adding Depakote if seizures recur -Continue EEG monitoring for now -Plan for MRI brain without contrast at 72 hours ( 08/12/2023) -Discussed plan in detail with patient's family at  bedside -Discussed plan with ICU team  CRITICAL CARE Performed by: Charlsie Quest   Total critical care time: 35 minutes  Critical care time was exclusive of separately billable procedures and treating other patients.  Critical care was necessary to treat or prevent imminent or life-threatening deterioration.  Critical care was time spent personally by me on the following activities: development of treatment plan with patient and/or surrogate as well as nursing, discussions with consultants, evaluation of patient's response to treatment, examination of patient, obtaining history from patient or surrogate, ordering and performing treatments and interventions, ordering and review of laboratory studies, ordering and review of radiographic studies, pulse oximetry and re-evaluation of patient's condition.   Lindie Spruce Epilepsy [facility] For questions after 5pm please refer to AMION to reach the  Neurologist on call

## 2023-08-10 NOTE — TOC CM/SW Note (Signed)
Transition of Care [facility]) - Inpatient Brief Assessment   Patient Details  Name: Todd [name] MRN: [id number] Date of Birth: 11-10-1950  [facility]) CM/SW Contact:    Harriet Masson, RN Phone Number: 08/10/2023, 1:42 PM   Clinical Narrative:  NCM unable to assess patient due to intubation at this time. Patient not medically stable for discharge.  NCM will continue to follow as patient progresses with care towards discharge.   Transition of Care Asessment: Insurance and Status: Insurance coverage has been reviewed Patient has primary care physician: Yes     Prior/Current Home Services: No current home services (used [facility] in the past) Social Drivers of Health Review: SDOH reviewed no interventions necessary Readmission risk has been reviewed: Yes Transition of care needs: transition of care needs identified, TOC will continue to follow

## 2023-08-10 NOTE — Progress Notes (Signed)
Patient with seizure like activity after bedpad changed, button on EEG pressed and 2mg  versed given per order. Dr. Amada Jupiter notified, 10mg  bolus of versed and rate increase to 2mg /hr versed ordered.

## 2023-08-10 NOTE — Progress Notes (Signed)
NAME:  Carlon Chaloux, MRN:  [id number], DOB:  1951/01/20, LOS: 2 ADMISSION DATE:  08/08/2023, CONSULTATION DATE:  08/08/2023 REFERRING MD:  Dr. Rhunette Croft, CHIEF COMPLAINT:  cardiac arrest   History of Present Illness:  Pt encephalopathic, therefore HPI obtained from EMR.    39 yoM with PMH of tobacco abuse, HTN, atrial fibrillation (not on AC given prior GIBs), pulmonary hypertension, HLD, ETOH abuse w/hx of withdrawal seizures, PAD, hypothyroidism, GERD, chronic pancreatitis, pancreatic duct dilation/ IPMN, and suspected cirrhosis presenting to [facility] ER due to altered mental status.  Family reports ongoing exertional SOB since recent discharge.  Recent hospitalization for 1/30- 2/5 left foot pain with ulcers, AKI, and SOB with anasarca treated with IV diureses;  underwent balloon angioplasty with left SFA stent placement with vascular surgery on 2/3.  Wife reported concerns of AMS, SBP 80's, with shaking but pt had returned to his baseline by EMS arrival and refused transport.  An hour later, pt was in respiratory distress and hypoxic.  By arrival to ER by EMS, HR had dropped to 20's and become pulseless.  ACLS measures provided including CPR, intubation, with ROSC after 15 mins.    ER workup significant for plts 82, AG 18, iCa 0.8, AST/ ALT 271/ 95, BNP 1697 (1769 on 1/30), trop 86, lactic > 9, INR 1.5, CXR showing extensive interstitial opacities favoring pulmonary edema with right small to moderate pleural effusion.  SARS 2 positive.  ABG 7.08/ 66/ 97/ 19.7.  Requiring vasopressor support.  CVL placed.  To be transferred to [facility] for higher level of care, PCCM accepted.   Pertinent  Medical History  Tobacco abuse, HTN, atrial fibrillation (not on AC given prior GIBs), pulmonary hypertension, HLD, ETOH abuse w/hx of withdrawal seizures, PAD, hypothyroidism, GERD, chronic pancreatitis, pancreatic duct dilation/ IPMN, suspected cirrhosis  Significant Hospital Events: Including procedures,  antibiotic start and stop dates in addition to other pertinent events   2/11 admit post arrest, intubated, transfer to [facility] ICU 2/13 seizure activity, versed bolus and valproate x1, bloody BM  Interim History / Subjective:  Sedated. Seizure activity overnight, got versed bolus and valproate. On versed drip. Noted bright red bloody BM last night.   Objective   Blood pressure 104/76, pulse 86, temperature 98.4 F (36.9 C), resp. rate 17, height 5' 7.99" (1.727 m), weight 44.1 kg, SpO2 100%. CVP:  [5 mmHg-13 mmHg] 6 mmHg  Vent Mode: PRVC FiO2 (%):  [80 %-100 %] 80 % Set Rate:  [24 bmp] 24 bmp Vt Set:  [550 mL] 550 mL PEEP:  [10 cmH20] 10 cmH20 Plateau Pressure:  [24 cmH20-30 cmH20] 28 cmH20   Intake/Output Summary (Last 24 hours) at 08/10/2023 0650 Last data filed at 08/10/2023 0600 Gross per 24 hour  Intake 967.42 ml  Output 1650 ml  Net -682.58 ml   Filed Weights   08/08/23 1224 08/08/23 1653 08/09/23 0600  Weight: 59 kg 44.9 kg 44.1 kg    Examination: General: sedated, in NAD HENT: ETT in place Lungs: on vent  Cardiovascular: irregular rhythm, normal rate Abdomen: hypoactive bowel sounds, soft  Extremities: wounds of b/l LE with dressing in place, noted pitting LE edema  Neuro: sedated GU: foley in place  Resolved Hospital Problem list     Assessment & Plan:  Cardiac arrest- suspect respiratory etiology. With reports of shaking, r/o seizure 2/2 hypoxia, hypoperfusion vs ?ETOH withdrawal seizure - on levophed for goal MAP > 65.  Wean levophed as tolerated - trend CVP - Ucx and Bcx  pending: no growth so far - trend lactic down to 3.5 - PCT 2.04, on abx    Acute hypoxic and hypercarbic respiratory failure 2/2 to ?ARDS related  SARS 2 positive vs pulmonary edema vs r/o CAP Tobacco abuse Repeat CXR with right pleural effusion and vascular congestion. S/p vanc. Negative urine strep/legionella. IV lasix with 1.6 L output.  - full MV support, 4-8cc/kg IBW with goal Pplat  <30 and DP<15  - VAP prevention protocol/ PPI - PAD protocol for sedation> versed, prn fentanyl.  RASS goal 0/-1 - adjust peep/ FiO2 as able for SpO2 >90%  - daily SAT & SBT when appropriate  - aggressive pulmonary hygiene> IS, flutter, mobilize  - bronchodilators > brovana, pulmicort, yupleri and prn albuterol  - solumedrol 40mg  daily  - Cefepime (day 3) - trach asp growing GPC and GNR, pending Ucx and Bcx - Lasix IV 40 mg daily     Seizures  Acute metabolic encephalopathy Hx of ETOH abuse- wife reports pt has not been drinking recently  CTH negative for acute intracranial process. EEG showed myoclonic sz every 10-30 seconds early 2/12.  - maintain neuro protective measures; goal for eurothermia, euglycemia, eunatermia, normoxia, and PCO2 goal of 35-40 - neurology following, appreciate assistance   - Increase Keppra 750 BID  - Versed drip and PRN - Klonopin 1 mg TID - Seizure precautions   - ammonia 40, could switch bowel regimen to lactulose - empiric thiamine    BRBPR Noted bloody BM overnight, no further episodes. CBC stable this morning. 2024 colonoscopy with internal hemorrhoids and removed polyps tubular adenoma. EGD then showed non-bleeding gastric ulcer.  - Trend CBC daily  - Recheck PM CBC today - On PPI  Pulmonary hypertension S/p RHC 06/12/23  c/w WHO Group III with mild increase in filling pressures w/passive leg raises, normal CO by assumed Fick - echo with normal EF but worsening MR and pericardial effusion - does not appear to be on antihypertensives at home - IV lasix daily    Afib, permanent  - rate controlled  - not on AC due to prior GIBs, prior cardioversion's failed - optimize electrolytes  - if RVR, may need to consider amio drip    AKI  Normal renal function in December. CXR with pulmonary edema, getting diuresis.   - trend BMP - cont foley  - strict I/Os, daily wts - consider renal US/ lytes if worsening  - avoid nephrotoxins, renal dose meds,  hemodynamic support as above   Transaminitis Anasarca  AST/ALT significantly elevated. Hx pancreatic duct dilation/ IPMN, chronic pancreatitis. Suspect shock liver +/- hepatic congestion, prior concern for cirrhosis on RUQ imaging. lipase normal. Ongoing LE edema.  - trend CMP  - LFTs improving  - IV lasix 40 daily   Chronic normocytic anemia Thrombocytopenia - H/H at baseline.  Recent plt 160>82, stable 129.  Could be related to liver disease.  PTA ASA/ plavix which are held. - trend CBC, transfuse for Hgb < 7 - hold DAPT    GERD Hx gastric ulcer - PPI    Hypothyroidism  TSH 0.967 on 07/04/23. Not taking synthroid per fill records.    Severe PAD prior recent left SFA balloon angioplasty with stent - Holding home ASA/ plavix    Sacral pressure wound, POA BLE foot wounds Protein-calorie malnutrition - hx of ongoing wt loss, suspect his anasarca is masking weight loss - WOC consult  - RD consult     IV infiltration RUE of vasopressors - phentolamine  and elevation per protocol    Poor mobility/ deconditioning  - reports limited mobility for months secondary to PAD, suspect PH, and other factors also contributing  - PT/ OT when appropriate    GOC - wife and children present on admission.  Remains full code for now, aware of MODS and high risk for further deterioration.  Has living will, would not want trach or PEG. Monitor him to see if significant improvement, otherwise continue GOC and consider PMT.   Best Practice (right click and "Reselect all SmartList Selections" daily)   Diet/type: NPO DVT prophylaxis not indicated SQ heparin held  Pressure ulcer(s): present on admission  GI prophylaxis: PPI Lines: Central line and Arterial Line Foley:  Yes, and it is still needed Code Status:  full code Last date of multidisciplinary goals of care discussion [updated wife 2/13]  Labs   CBC: Recent Labs  Lab 08/08/23 1220 08/08/23 1742 08/09/23 0603 08/09/23 1108  08/10/23 0420  WBC 4.9  --  8.9  --  11.5*  HGB 11.1*  12.2* 11.9* 10.1* 10.5* 11.5*  HCT 38.5*  36.0* 35.0* 32.7* 31.0* 36.0*  MCV 95.5  --  89.3  --  87.6  PLT 82*  --  118*  --  129*    Basic Metabolic Panel: Recent Labs  Lab 08/08/23 1220 08/08/23 1742 08/09/23 0603 08/09/23 1108 08/09/23 1700 08/09/23 2039 08/10/23 0420  NA 136  135 137 136 138  --   --  138  K 4.4  4.4 5.2* 3.9 3.9  --   --  3.7  CL 96*  102  --  100  --   --   --  101  CO2 22  --  22  --   --   --  23  GLUCOSE 141*  130*  --  153*  --   --   --  219*  BUN 24*  24*  --  27*  --   --   --  34*  CREATININE 1.50*  1.60*  --  1.68*  --   --   --  1.66*  CALCIUM 7.6*  --  7.7*  --   --   --  7.7*  MG 2.2  --  1.8  --  2.5*  --  2.2  PHOS 4.6  --   --   --  3.0 3.1 2.4*   GFR: Estimated Creatinine Clearance: 24.7 mL/min (A) (by C-G formula based on SCr of 1.66 mg/dL (H)). Recent Labs  Lab 08/08/23 1220 08/08/23 1431 08/08/23 1749 08/08/23 2209 08/09/23 0603 08/10/23 0420  PROCALCITON <0.10  --   --   --  2.04  --   WBC 4.9  --   --   --  8.9 11.5*  LATICACIDVEN >9.0* 8.4* 8.3* 3.5*  --   --     Liver Function Tests: Recent Labs  Lab 08/08/23 1220 08/09/23 0603 08/10/23 0420  AST 271* 926* 564*  ALT 95* 258* 234*  ALKPHOS 65 59 67  BILITOT 0.8 1.4* 1.3*  PROT 6.3* 5.7* 5.1*  ALBUMIN 2.7* 2.4* 2.1*   Recent Labs  Lab 08/09/23 0603  LIPASE 18   Recent Labs  Lab 08/08/23 2209  AMMONIA 40*    ABG    Component Value Date/Time   PHART 7.577 (H) 08/09/2023 1108   PCO2ART 27.5 (L) 08/09/2023 1108   PO2ART 61 (L) 08/09/2023 1108   HCO3 25.6 08/09/2023 1108   TCO2 26 08/09/2023 1108  ACIDBASEDEF 2.0 08/08/2023 1742   O2SAT 95 08/09/2023 1108     Coagulation Profile: Recent Labs  Lab 08/08/23 1220 08/09/23 2039  INR 1.5* 1.5*    Cardiac Enzymes: No results for input(s): "CKTOTAL", "CKMB", "CKMBINDEX", "TROPONINI" in the last 168 hours.  HbA1C: Hgb A1c MFr Bld   Date/Time Value Ref Range Status  08/09/2023 10:54 AM 6.7 (H) 4.8 - 5.6 % Final    Comment:    (NOTE) Pre diabetes:          5.7%-6.4%  Diabetes:              >6.4%  Glycemic control for   <7.0% adults with diabetes     CBG: Recent Labs  Lab 08/09/23 1136 08/09/23 1603 08/09/23 2018 08/10/23 0032 08/10/23 0416  GLUCAP 106* 131* 158* 154* 206*    Review of Systems:   Unable to obtain. Sedated.  Past Medical History:  He,  has a past medical history of Alcoholism (HCC), Anemia, Atrial fibrillation (HCC), Carotid artery occlusion, Chronic back pain, Colitis, Dysrhythmia, Essential hypertension, Gout, Heart murmur, History of GI bleed, Hypothyroidism, Internal hemorrhoids, Osteoarthritis, Peripheral vascular disease (HCC), and Schatzki's ring.   Surgical History:   Past Surgical History:  Procedure Laterality Date   ABDOMINAL AORTOGRAM N/A 03/08/2019   Procedure: ABDOMINAL AORTOGRAM;  Surgeon: Sherren Kerns, MD;  Location: [facility] INVASIVE CV LAB;  Service: Cardiovascular;  Laterality: N/A;   ABDOMINAL AORTOGRAM W/LOWER EXTREMITY N/A 03/02/2021   Procedure: ABDOMINAL AORTOGRAM W/LOWER EXTREMITY;  Surgeon: Nada Libman, MD;  Location: [facility] INVASIVE CV LAB;  Service: Cardiovascular;  Laterality: N/A;   ABDOMINAL AORTOGRAM W/LOWER EXTREMITY N/A 07/31/2023   Procedure: ABDOMINAL AORTOGRAM W/LOWER EXTREMITY;  Surgeon: Daria Pastures, MD;  Location: [facility] INVASIVE CV LAB;  Service: Cardiovascular;  Laterality: N/A;   APPLICATION OF WOUND VAC Right 07/22/2021   Procedure: APPLICATION OF WOUND VAC;  Surgeon: Nada Libman, MD;  Location: [facility] OR;  Service: Vascular;  Laterality: Right;   BIOPSY  10/20/2022   Procedure: BIOPSY;  Surgeon: Lanelle Bal, DO;  Location: [facility] ENDO SUITE;  Service: Endoscopy;;   COLONOSCOPY WITH PROPOFOL N/A 10/20/2022   Procedure: COLONOSCOPY WITH PROPOFOL;  Surgeon: Lanelle Bal, DO;  Location: [facility];  Service: Endoscopy;  Laterality: N/A;    COLONOSCOPY WITH PROPOFOL N/A 12/09/2022   Procedure: COLONOSCOPY WITH PROPOFOL;  Surgeon: Lanelle Bal, DO;  Location: [facility];  Service: Endoscopy;  Laterality: N/A;  10:15 AM, ASA 3   ENDARTERECTOMY Left 02/02/2022   Procedure: LEFT ENDARTERECTOMY CAROTID;  Surgeon: Nada Libman, MD;  Location: [facility] OR;  Service: Vascular;  Laterality: Left;   ESOPHAGOGASTRODUODENOSCOPY (EGD) WITH PROPOFOL N/A 10/20/2022   Procedure: ESOPHAGOGASTRODUODENOSCOPY (EGD) WITH PROPOFOL;  Surgeon: Lanelle Bal, DO;  Location: [facility];  Service: Endoscopy;  Laterality: N/A;   ESOPHAGOGASTRODUODENOSCOPY (EGD) WITH PROPOFOL N/A 02/07/2023   Procedure: ESOPHAGOGASTRODUODENOSCOPY (EGD) WITH PROPOFOL;  Surgeon: Meridee Score Netty Starring., MD;  Location: [facility] ENDOSCOPY;  Service: Gastroenterology;  Laterality: N/A;   EUS N/A 02/07/2023   Procedure: UPPER ENDOSCOPIC ULTRASOUND (EUS) RADIAL;  Surgeon: Lemar Lofty., MD;  Location: [facility] ENDOSCOPY;  Service: Gastroenterology;  Laterality: N/A;   Excision of gouty lesion     Left index finger   FEMORAL-POPLITEAL BYPASS GRAFT Right 03/12/2021   Procedure: RIGHT FEMORAL TO POPLITEAL ARTERY BYPASS GRAFTING USING THE PROPATEN GRAFT;  Surgeon: Nada Libman, MD;  Location: [facility] OR;  Service: Vascular;  Laterality: Right;  FINE NEEDLE ASPIRATION N/A 02/07/2023   Procedure: FINE NEEDLE ASPIRATION (FNA) LINEAR;  Surgeon: Lemar Lofty., MD;  Location: [facility] ENDOSCOPY;  Service: Gastroenterology;  Laterality: N/A;   GROIN DEBRIDEMENT Right 07/22/2021   Procedure: RIGHT GROIN DEBRIDEMENT;  Surgeon: Nada Libman, MD;  [facility] OR;  Service: Vascular;  Laterality: Right;   HOT HEMOSTASIS N/A 02/07/2023   Procedure: HOT HEMOSTASIS (ARGON PLASMA COAGULATION/BICAP);  Surgeon: Lemar Lofty., MD;  Location: [facility];  Service: Gastroenterology;  Laterality: N/A;   INSERTION OF ILIAC STENT Right 03/12/2021   Procedure: INSERTION OF  EXTERNAL ILIAC  STENT;  Surgeon: Nada Libman, MD;  Location: [facility] OR;  Service: Vascular;  Laterality: Right;   LOWER EXTREMITY ANGIOGRAM Right 03/12/2021   Procedure: LOWER EXTREMITY ANGIOGRAM;  Surgeon: Nada Libman, MD;  Location: [facility] OR;  Service: Vascular;  Laterality: Right;   LOWER EXTREMITY ANGIOGRAPHY Bilateral 03/08/2019   Procedure: Lower Extremity Angiography;  Surgeon: Sherren Kerns, MD;  Location: [facility] INVASIVE CV LAB;  Service: Cardiovascular;  Laterality: Bilateral;   LOWER EXTREMITY ANGIOGRAPHY N/A 03/15/2021   Procedure: LOWER EXTREMITY ANGIOGRAPHY;  Surgeon: Maeola Harman, MD;  Location: [facility] INVASIVE CV LAB;  Service: Cardiovascular;  Laterality: N/A;   MASS EXCISION Right 07/07/2020   Procedure: EXCISION OF RIGHT FACIAL MASS;  Surgeon: Newman Pies, MD;  Location: [facility];  Service: ENT;  Laterality: Right;   PATCH ANGIOPLASTY Left 02/02/2022   Procedure: PATCH ANGIOPLASTY OF LEFT CAROTID USING XENOSURE BOVINE PERICARDIUM PATCH;  Surgeon: Nada Libman, MD;  Location: [facility] OR;  Service: Vascular;  Laterality: Left;   PERIPHERAL VASCULAR BALLOON ANGIOPLASTY Right 03/02/2021   Procedure: PERIPHERAL VASCULAR BALLOON ANGIOPLASTY;  Surgeon: Nada Libman, MD;  Location: [facility] INVASIVE CV LAB;  Service: Cardiovascular;  Laterality: Right;  external iliac   PERIPHERAL VASCULAR INTERVENTION Bilateral 03/08/2019   Procedure: PERIPHERAL VASCULAR INTERVENTION;  Surgeon: Sherren Kerns, MD;  Location: [facility] INVASIVE CV LAB;  Service: Cardiovascular;  Laterality: Bilateral;  ext iliac artery stent   PERIPHERAL VASCULAR INTERVENTION Left 03/02/2021   Procedure: PERIPHERAL VASCULAR INTERVENTION;  Surgeon: Nada Libman, MD;  Location: [facility] INVASIVE CV LAB;  Service: Cardiovascular;  Laterality: Left;  external iliac   PERIPHERAL VASCULAR INTERVENTION Right 03/15/2021   Procedure: PERIPHERAL VASCULAR INTERVENTION;  Surgeon: Maeola Harman, MD;  Location: [facility] INVASIVE CV LAB;   Service: Cardiovascular;  Laterality: Right;  external iliac   POLYPECTOMY  12/09/2022   Procedure: POLYPECTOMY INTESTINAL;  Surgeon: Lanelle Bal, DO;  [facility];  Service: Endoscopy;;   RIGHT HEART CATH N/A 06/12/2023   Procedure: RIGHT HEART CATH;  Surgeon: Romie Minus, MD;  Location: [facility] INVASIVE CV LAB;  Service: Cardiovascular;  Laterality: N/A;   Right knee arthroscopy     SPINE SURGERY       Social History:   reports that he has been smoking cigarettes. He has been exposed to tobacco smoke. He has never used smokeless tobacco. He reports current alcohol use of about 5.0 standard drinks of alcohol per week. He reports that he does not use drugs.   Family History:  His family history includes Coronary artery disease in his father and sister.   Allergies No Known Allergies   Home Medications  Prior to Admission medications   Medication Sig Start Date End Date Taking? Authorizing Provider  aspirin EC 81 MG EC tablet Take 1 tablet (81 mg total) by mouth daily at 6 (six) AM.  Swallow whole. 03/17/21   Setzer, Lynnell Jude, PA-C  cholecalciferol (VITAMIN D3) 25 MCG (1000 UNIT) tablet Take 1,000 Units by mouth daily.    [provider]  clopidogrel (PLAVIX) 75 MG tablet TAKE ONE TABLET BY MOUTH ONCE DAILY. 05/05/23   Jonelle Sidle, MD  diclofenac Sodium (VOLTAREN) 1 % GEL Apply 4 g topically 4 (four) times daily as needed (pain). 01/31/23   [provider]  Ensure (ENSURE) Take 1 Can by mouth daily.    [provider]  FARXIGA 10 MG TABS tablet Take 1 tablet (10 mg total) by mouth daily before breakfast. 05/30/23   Romie Minus, MD  furosemide (LASIX) 20 MG tablet Take 1 tablet (20 mg total) by mouth daily. 06/12/23   Romie Minus, MD  levothyroxine (SYNTHROID) 25 MCG tablet Take 25 mcg by mouth daily before breakfast.    [provider]  lipase/protease/amylase (CREON) 36000 UNITS CPEP capsule Take 2 capsules (72,000  Units total) by mouth 3 (three) times daily before meals. And 1 capsule with snacks 03/15/23 03/14/24  Lanelle Bal, DO  pantoprazole (PROTONIX) 40 MG tablet Take 1 tablet (40 mg total) by mouth 2 (two) times daily. 11/03/22 11/03/23  Lanelle Bal, DO  rosuvastatin (CRESTOR) 10 MG tablet TAKE (1) TABLET BY MOUTH ONCE DAILY. 09/09/20   Maeola Harman, MD     Critical care time:

## 2023-08-10 NOTE — Procedures (Signed)
Patient Name: Todd [name]  MRN: [id number]  Epilepsy Attending: Charlsie Quest  Referring Physician/Provider: Erick Blinks, MD  Duration: 08/09/2023 7829 to 08/10/2023 5621   Patient history: 73 year old male status post cardiac arrest.  EEG to evaluate for seizure.   Level of alertness: comatose   AEDs during EEG study: LEV, versed   Technical aspects: This EEG study was done with scalp electrodes positioned according to the 10-20 International system of electrode placement. Electrical activity was reviewed with band pass filter of 1-70Hz , sensitivity of 7 uV/mm, display speed of 98mm/sec with a 60Hz  notched filter applied as appropriate. EEG data were recorded continuously and digitally stored.  Video monitoring was not available due to technical issues   Description: At the beginning of the study, EEG showed near continuous generalized 3 to 5 Hz theta-delta slowing admixed with 1 to 3 seconds of generalized EEG attenuation.  On 08/10/2023 around 0325, EEG started worsening and showed generalized periodic discharges at 1 Hz.  Event button was pressed on 08/10/2023 at 0427 for twitching in eyes and upper body.  Concomitant EEG showed generalized  periodic discharges at 2 Hz.  In the setting of cardiac arrest, this EEG pattern is concerning for myoclonic seizures.  IV Versed was given after which EEG improved and again showed near continuous generalized 3 to 6 Hz theta-delta slowing.  Again on 08/10/2023 at around 830, staff was doing patient care, EEG showed generalized periodic discharges at 1 Hz which resolved once patient care was done   ABNORMALITY -Stimulus induced rhythmic periodic discharges, generalized -Continuous slow, generalized   IMPRESSION: This study was suggestive of severe diffuse encephalopathy.  Event button was pressed on 08/10/2023 at 0427 during which patient had twitching in eyes and upper body.  Concomitant EEG showed generalized periodic discharges at 2  Hz.  In the setting of cardiac arrest, this EEG pattern is concerning for myoclonic seizures  Again on 08/10/2023 at around 0830, generalized periodic discharges were noted predominantly during stimulation.  These discharges are consistent with stimulus induced rhythmic periodic discharges and suggest increased risk of seizure recurrence   Kaidance Pantoja Annabelle Harman

## 2023-08-11 ENCOUNTER — Inpatient Hospital Stay (HOSPITAL_COMMUNITY): Payer: PPO

## 2023-08-11 DIAGNOSIS — J9601 Acute respiratory failure with hypoxia: Secondary | ICD-10-CM | POA: Diagnosis not present

## 2023-08-11 DIAGNOSIS — I469 Cardiac arrest, cause unspecified: Secondary | ICD-10-CM | POA: Diagnosis not present

## 2023-08-11 DIAGNOSIS — I272 Pulmonary hypertension, unspecified: Secondary | ICD-10-CM | POA: Diagnosis not present

## 2023-08-11 DIAGNOSIS — U071 COVID-19: Secondary | ICD-10-CM | POA: Diagnosis not present

## 2023-08-11 LAB — CBC
HCT: 35.6 % — ABNORMAL LOW (ref 39.0–52.0)
Hemoglobin: 10.9 g/dL — ABNORMAL LOW (ref 13.0–17.0)
MCH: 27.3 pg (ref 26.0–34.0)
MCHC: 30.6 g/dL (ref 30.0–36.0)
MCV: 89 fL (ref 80.0–100.0)
Platelets: 88 10*3/uL — ABNORMAL LOW (ref 150–400)
RBC: 4 MIL/uL — ABNORMAL LOW (ref 4.22–5.81)
RDW: 19.2 % — ABNORMAL HIGH (ref 11.5–15.5)
WBC: 14.3 10*3/uL — ABNORMAL HIGH (ref 4.0–10.5)
nRBC: 2 % — ABNORMAL HIGH (ref 0.0–0.2)

## 2023-08-11 LAB — COMPREHENSIVE METABOLIC PANEL
ALT: 200 U/L — ABNORMAL HIGH (ref 0–44)
AST: 323 U/L — ABNORMAL HIGH (ref 15–41)
Albumin: 2.1 g/dL — ABNORMAL LOW (ref 3.5–5.0)
Alkaline Phosphatase: 73 U/L (ref 38–126)
Anion gap: 13 (ref 5–15)
BUN: 42 mg/dL — ABNORMAL HIGH (ref 8–23)
CO2: 24 mmol/L (ref 22–32)
Calcium: 7.5 mg/dL — ABNORMAL LOW (ref 8.9–10.3)
Chloride: 103 mmol/L (ref 98–111)
Creatinine, Ser: 1.6 mg/dL — ABNORMAL HIGH (ref 0.61–1.24)
GFR, Estimated: 45 mL/min — ABNORMAL LOW (ref >60)
Glucose, Bld: 227 mg/dL — ABNORMAL HIGH (ref 70–99)
Potassium: 3.7 mmol/L (ref 3.5–5.1)
Sodium: 140 mmol/L (ref 135–145)
Total Bilirubin: 1.2 mg/dL (ref 0.0–1.2)
Total Protein: 5.2 g/dL — ABNORMAL LOW (ref 6.5–8.1)

## 2023-08-11 LAB — GLUCOSE, CAPILLARY
Glucose-Capillary: 116 mg/dL — ABNORMAL HIGH (ref 70–99)
Glucose-Capillary: 94 mg/dL (ref 70–99)
Glucose-Capillary: 125 mg/dL — ABNORMAL HIGH (ref 70–99)
Glucose-Capillary: 48 mg/dL — ABNORMAL LOW (ref 70–99)
Glucose-Capillary: 50 mg/dL — ABNORMAL LOW (ref 70–99)
Glucose-Capillary: 171 mg/dL — ABNORMAL HIGH (ref 70–99)
Glucose-Capillary: 143 mg/dL — ABNORMAL HIGH (ref 70–99)
Glucose-Capillary: 151 mg/dL — ABNORMAL HIGH (ref 70–99)

## 2023-08-11 LAB — CULTURE, RESPIRATORY W GRAM STAIN

## 2023-08-11 LAB — MAGNESIUM: Magnesium: 2.2 mg/dL (ref 1.7–2.4)

## 2023-08-11 LAB — PHOSPHORUS: Phosphorus: 2.6 mg/dL (ref 2.5–4.6)

## 2023-08-11 MED ORDER — DEXTROSE 50 % IV SOLN
INTRAVENOUS | Status: AC
  Administered 2023-08-11: 25 g via INTRAVENOUS
  Filled 2023-08-11: qty 50

## 2023-08-11 MED ORDER — HEPARIN SODIUM (PORCINE) 5000 UNIT/ML IJ SOLN
5000.0000 [IU] | Freq: Three times a day (TID) | INTRAMUSCULAR | Status: DC
  Administered 2023-08-11 – 2023-08-12 (×4): 5000 [IU] via SUBCUTANEOUS
  Filled 2023-08-11 (×4): qty 1

## 2023-08-11 MED ORDER — VALPROATE SODIUM 100 MG/ML IV SOLN: 2000.0000 mg | Freq: Once | INTRAVENOUS | Status: DC

## 2023-08-11 MED ORDER — VALPROATE SODIUM 100 MG/ML IV SOLN
500.0000 mg | Freq: Three times a day (TID) | INTRAVENOUS | Status: DC
  Administered 2023-08-11 – 2023-08-12 (×3): 500 mg via INTRAVENOUS
  Filled 2023-08-11 (×6): qty 5

## 2023-08-11 MED ORDER — FUROSEMIDE 10 MG/ML IJ SOLN
40.0000 mg | Freq: Two times a day (BID) | INTRAMUSCULAR | Status: DC
  Administered 2023-08-11 – 2023-08-12 (×2): 40 mg via INTRAVENOUS
  Filled 2023-08-11 (×2): qty 4

## 2023-08-11 MED ORDER — PIPERACILLIN-TAZOBACTAM 3.375 G IVPB
3.3750 g | Freq: Three times a day (TID) | INTRAVENOUS | Status: AC
  Administered 2023-08-11 – 2023-08-14 (×10): 3.375 g via INTRAVENOUS
  Filled 2023-08-11 (×9): qty 50

## 2023-08-11 MED ORDER — FUROSEMIDE 10 MG/ML IJ SOLN
40.0000 mg | Freq: Every day | INTRAMUSCULAR | Status: DC
  Administered 2023-08-11: 40 mg via INTRAVENOUS
  Filled 2023-08-11: qty 4

## 2023-08-11 MED ORDER — POTASSIUM CHLORIDE 20 MEQ PO PACK
40.0000 meq | PACK | Freq: Once | ORAL | Status: AC
  Administered 2023-08-11: 40 meq
  Filled 2023-08-11: qty 2

## 2023-08-11 MED ORDER — DEXTROSE 50 % IV SOLN: 25.0000 g | INTRAVENOUS | Status: AC

## 2023-08-11 MED ORDER — DEXTROSE 5 % IV SOLN: 500.0000 mg | Freq: Three times a day (TID) | INTRAVENOUS | Status: DC

## 2023-08-11 NOTE — Progress Notes (Signed)
Subjective: Event of shoulder jerking overnight. Daughter at bedside  ROS: Unable to obtain due to poor mental status  Examination  Vital signs in last 24 hours: Temp:  [96.8 F (36 C)-98.4 F (36.9 C)] 97.2 F (36.2 C) (02/14 0815) Pulse Rate:  [87-103] 99 (02/14 0811) Resp:  [0-29] 12 (02/14 0815) BP: (81-112)/(64-80) 98/72 (02/14 0800) SpO2:  [95 %-100 %] 100 % (02/14 0811) Arterial Line BP: (87-132)/(41-75) 97/58 (02/14 0815) FiO2 (%):  [60 %-70 %] 60 % (02/14 0811)  General: lying in bed, intubated Extremities: Bluish discoloration of  all extremities Neuro: On Versed at 1 mL/h, does not open eyes to stimuli, does not follow commands, pupils equally round reactive to light, no forced gaze deviation, corneal reflex intact, cough reflex absent, does not withdraw to noxious stimuli in BL UE, withdraws to noxious stimuli in BL LE  Basic Metabolic Panel: Recent Labs  Lab 08/08/23 1220 08/08/23 1742 08/09/23 0603 08/09/23 1108 08/09/23 1700 08/09/23 2039 08/10/23 0420 08/11/23 0407  NA 136  135 137 136 138  --   --  138 140  K 4.4  4.4 5.2* 3.9 3.9  --   --  3.7 3.7  CL 96*  102  --  100  --   --   --  101 103  CO2 22  --  22  --   --   --  23 24  GLUCOSE 141*  130*  --  153*  --   --   --  219* 227*  BUN 24*  24*  --  27*  --   --   --  34* 42*  CREATININE 1.50*  1.60*  --  1.68*  --   --   --  1.66* 1.60*  CALCIUM 7.6*  --  7.7*  --   --   --  7.7* 7.5*  MG 2.2  --  1.8  --  2.5*  --  2.2 2.2  PHOS 4.6  --   --   --  3.0 3.1 2.4* 2.6    CBC: Recent Labs  Lab 08/08/23 1220 08/08/23 1742 08/09/23 0603 08/09/23 1108 08/10/23 0420 08/10/23 1251 08/11/23 0407  WBC 4.9  --  8.9  --  11.5* 13.0* 14.3*  HGB 11.1*  12.2*   < > 10.1* 10.5* 11.5* 11.4* 10.9*  HCT 38.5*  36.0*   < > 32.7* 31.0* 36.0* 35.9* 35.6*  MCV 95.5  --  89.3  --  87.6 87.3 89.0  PLT 82*  --  118*  --  129* 111* 88*   < > = values in this interval not displayed.     Coagulation  Studies: Recent Labs    08/08/23 1220 08/09/23 2039  LABPROT 18.6* 18.7*  INR 1.5* 1.5*    Imaging No new brain imaging  ASSESSMENT AND PLAN: 73 year old male admitted to ICU after PEA arrest with high-quality CPR with ROSC in 15 minutes.   Cardiac arrest Transaminitis -Patient has had myoclonic seizures, worse with stimulation   Recommendations - Reduce versed to 50ml/hr. At around noon, stop versed.  -Start VPA 500mg  Q8h, watch platelets -Continue Klonopin 1 mg 3 times daily, Keppra 750 mg twice daily (maximal dose for current renal function) -Continue EEG monitoring for now -Plan for MRI brain without contrast at 72 hours ( 08/12/2023) - Discussed with daughter that after we wean sedation, we ait for improvement in mental status. If sz recur and patient doesn't wake up, then we would be concerned  about anoxic brian damage. Patient has previously expressed to family that he wouldn't want trach and daughter again expressed today that if his quality if life is worse than before, then he wouldn't want to continue care -Discussed plan in detail with patient's daughter at bedside -Discussed plan with ICU team   CRITICAL CARE Performed by: Charlsie Quest    Total critical care time: 32 minutes   Critical care time was exclusive of separately billable procedures and treating other patients.   Critical care was necessary to treat or prevent imminent or life-threatening deterioration.   Critical care was time spent personally by me on the following activities: development of treatment plan with patient and/or surrogate as well as nursing, discussions with consultants, evaluation of patient's response to treatment, examination of patient, obtaining history from patient or surrogate, ordering and performing treatments and interventions, ordering and review of laboratory studies, ordering and review of radiographic studies, pulse oximetry and re-evaluation of patient's  condition.    Lindie Spruce Epilepsy [facility] For questions after 5pm please refer to AMION to reach the Neurologist on call

## 2023-08-11 NOTE — Progress Notes (Signed)
LTM maint complete - no skin breakdown under: F7,F8

## 2023-08-11 NOTE — Progress Notes (Signed)
NAME:  Todd [name], MRN:  [id number], DOB:  1951/05/08, LOS: 3 ADMISSION DATE:  08/08/2023, CONSULTATION DATE:  08/08/2023 REFERRING MD:  Dr. Rhunette Croft, CHIEF COMPLAINT:  cardiac arrest   History of Present Illness:  Pt encephalopathic, therefore HPI obtained from EMR.    8 yoM with PMH of tobacco abuse, HTN, atrial fibrillation (not on AC given prior GIBs), pulmonary hypertension, HLD, ETOH abuse w/hx of withdrawal seizures, PAD, hypothyroidism, GERD, chronic pancreatitis, pancreatic duct dilation/ IPMN, and suspected cirrhosis presenting to [facility] ER due to altered mental status.  Family reports ongoing exertional SOB since recent discharge.  Recent hospitalization for 1/30- 2/5 left foot pain with ulcers, AKI, and SOB with anasarca treated with IV diureses;  underwent balloon angioplasty with left SFA stent placement with vascular surgery on 2/3.  Wife reported concerns of AMS, SBP 80's, with shaking but pt had returned to his baseline by EMS arrival and refused transport.  An hour later, pt was in respiratory distress and hypoxic.  By arrival to ER by EMS, HR had dropped to 20's and become pulseless.  ACLS measures provided including CPR, intubation, with ROSC after 15 mins.    ER workup significant for plts 82, AG 18, iCa 0.8, AST/ ALT 271/ 95, BNP 1697 (1769 on 1/30), trop 86, lactic > 9, INR 1.5, CXR showing extensive interstitial opacities favoring pulmonary edema with right small to moderate pleural effusion.  SARS 2 positive.  ABG 7.08/ 66/ 97/ 19.7.  Requiring vasopressor support.  CVL placed.  To be transferred to [facility] for higher level of care, PCCM accepted.   Pertinent  Medical History  Tobacco abuse, HTN, atrial fibrillation (not on AC given prior GIBs), pulmonary hypertension, HLD, ETOH abuse w/hx of withdrawal seizures, PAD, hypothyroidism, GERD, chronic pancreatitis, pancreatic duct dilation/ IPMN, suspected cirrhosis  Significant Hospital Events: Including procedures,  antibiotic start and stop dates in addition to other pertinent events   2/11 admit post arrest, intubated, transfer to [facility] ICU 2/13 seizure activity, versed bolus and valproate x1, bloody BM  Interim History / Subjective:  Sedated. No documented bloody stools.   Objective   Blood pressure 102/78, pulse 97, temperature (!) 97.5 F (36.4 C), temperature source Bladder, resp. rate (!) 24, height 5' 7.99" (1.727 m), weight 44.1 kg, SpO2 100%. CVP:  [4 mmHg-10 mmHg] 5 mmHg  Vent Mode: PRVC FiO2 (%):  [70 %] 70 % Set Rate:  [24 bmp] 24 bmp Vt Set:  [550 mL] 550 mL PEEP:  [10 cmH20] 10 cmH20 Plateau Pressure:  [22 cmH20-30 cmH20] 29 cmH20   Intake/Output Summary (Last 24 hours) at 08/11/2023 0656 Last data filed at 08/11/2023 4098 Gross per 24 hour  Intake 1566.21 ml  Output 1650 ml  Net -83.79 ml   Filed Weights   08/08/23 1224 08/08/23 1653 08/09/23 0600  Weight: 59 kg 44.9 kg 44.1 kg    Examination: General: sedated, in NAD HENT: ETT in place Lungs: on vent  Cardiovascular: irregular rhythm, normal rate Abdomen: bowel sounds present, soft, non-distended Extremities: wounds of b/l LE with dressing in place, noted pitting LE edema  Neuro: sedated GU: foley in place  Resolved Hospital Problem list     Assessment & Plan:  Cardiac arrest- suspect respiratory etiology. With reports of shaking, r/o seizure 2/2 hypoxia, hypoperfusion vs ?ETOH withdrawal seizure - on levophed for goal MAP > 65.  Wean levophed as tolerated - trend CVP - Ucx and Bcx pending: no growth so far - lactic acid trend down  -  PCT 2.04, on abx    Acute hypoxic and hypercarbic respiratory failure 2/2 to ?ARDS related  SARS 2 positive vs pulmonary edema vs r/o CAP Tobacco abuse Repeat CXR with right pleural effusion and vascular congestion. S/p vanc. Negative urine strep/legionella. IV lasix with 1.6 L output.  - full MV support, 4-8cc/kg IBW with goal Pplat <30 and DP<15  - VAP prevention protocol/  PPI - PAD protocol for sedation> versed, prn fentanyl.  RASS goal 0/-1 - adjust peep/ FiO2 as able for SpO2 >90%  - daily SAT & SBT when appropriate  - aggressive pulmonary hygiene> IS, flutter, mobilize  - bronchodilators > brovana, pulmicort, yupleri and prn albuterol  - solumedrol 40mg  daily x 5 days - Cefepime x 7 days  - trach asp growing GPC and GNR, pending Ucx and Bcx - Increase Lasix IV 40 mg to BID     Seizures  Acute metabolic encephalopathy Hx of ETOH abuse- wife reports pt has not been drinking recently  CTH negative for acute intracranial process. EEG showed myoclonic sz every 10-30 seconds early 2/12.  - maintain neuro protective measures; goal for eurothermia, euglycemia, eunatermia, normoxia, and PCO2 goal of 35-40 - neurology following, appreciate assistance   - Keppra 750 BID  - Versed drip and PRN - Klonopin 1 mg TID - Repeat MRI brain wo in 72 hrs, plan for 2/15 - Seizure precautions   - empiric thiamine    BRBPR, resolved Internal hemorrhoids Noted bloody BM 2/13 overnight. CBC remains stable. 2024 colonoscopy with internal hemorrhoids and removed polyps tubular adenoma. EGD then showed non-bleeding gastric ulcer. No further episodes of bloody BM, last charted brown.  - Trend CBC daily  - On PPI  Pulmonary hypertension S/p RHC 06/12/23  c/w WHO Group III with mild increase in filling pressures w/passive leg raises, normal CO by assumed Fick - echo with normal EF but worsening MR and pericardial effusion - does not appear to be on antihypertensives at home - IV lasix daily    Afib, permanent  - rate controlled  - not on AC due to prior GIBs, prior cardioversion's failed - optimize electrolytes  - if RVR, may need to consider amio drip    AKI  Normal renal function in December. CXR with pulmonary edema, getting diuresis.   - trend BMP - cont foley  - strict I/Os, daily wts - consider renal US/ lytes if worsening  - avoid nephrotoxins, renal dose  meds, hemodynamic support as above   Transaminitis Anasarca  Cirrhosis  AST/ALT significantly elevated. Hx pancreatic duct dilation/ IPMN, chronic pancreatitis. Suspect shock liver +/- hepatic congestion, prior concern for cirrhosis on RUQ imaging. lipase normal. Ongoing LE edema.  - trend CMP  - LFTs improving  - On IV lasix - Lactulose 20 g TID  Chronic normocytic anemia Thrombocytopenia - H/H at baseline.  Recent plt 160. Fluctuating at 88 today.  Could be related to liver disease.  PTA ASA/ plavix which are held. - trend CBC, transfuse for Hgb < 7 - hold DAPT    GERD Hx gastric ulcer - PPI    Hypothyroidism  TSH 0.967 on 07/04/23. Not taking synthroid per fill records.    Severe PAD prior recent left SFA balloon angioplasty with stent - Holding home ASA/ plavix    Sacral pressure wound, POA BLE foot wounds Protein-calorie malnutrition - hx of ongoing wt loss, suspect his anasarca is masking weight loss - WOC consult  - RD consult     IV  infiltration RUE of vasopressors - phentolamine and elevation per protocol    Poor mobility/ deconditioning  - reports limited mobility for months secondary to PAD, suspect PH, and other factors also contributing  - PT/ OT when appropriate    GOC - wife and children present on admission.  Remains full code for now, aware of MODS and high risk for further deterioration.  Has living will, would not want trach or PEG. Monitor him to see if significant improvement, otherwise continue GOC and consider PMT.   Best Practice (right click and "Reselect all SmartList Selections" daily)   Diet/type: NPO DVT prophylaxis prophylactic heparin   Pressure ulcer(s): present on admission  GI prophylaxis: PPI Lines: Central line and Arterial Line Foley:  Yes, and it is still needed Code Status:  full code Last date of multidisciplinary goals of care discussion [updated wife 2/13]  Labs   CBC: Recent Labs  Lab 08/08/23 1220 08/08/23 1742  08/09/23 0603 08/09/23 1108 08/10/23 0420 08/10/23 1251 08/11/23 0407  WBC 4.9  --  8.9  --  11.5* 13.0* 14.3*  HGB 11.1*  12.2*   < > 10.1* 10.5* 11.5* 11.4* 10.9*  HCT 38.5*  36.0*   < > 32.7* 31.0* 36.0* 35.9* 35.6*  MCV 95.5  --  89.3  --  87.6 87.3 89.0  PLT 82*  --  118*  --  129* 111* 88*   < > = values in this interval not displayed.    Basic Metabolic Panel: Recent Labs  Lab 08/08/23 1220 08/08/23 1742 08/09/23 0603 08/09/23 1108 08/09/23 1700 08/09/23 2039 08/10/23 0420 08/11/23 0407  NA 136  135 137 136 138  --   --  138 140  K 4.4  4.4 5.2* 3.9 3.9  --   --  3.7 3.7  CL 96*  102  --  100  --   --   --  101 103  CO2 22  --  22  --   --   --  23 24  GLUCOSE 141*  130*  --  153*  --   --   --  219* 227*  BUN 24*  24*  --  27*  --   --   --  34* 42*  CREATININE 1.50*  1.60*  --  1.68*  --   --   --  1.66* 1.60*  CALCIUM 7.6*  --  7.7*  --   --   --  7.7* 7.5*  MG 2.2  --  1.8  --  2.5*  --  2.2 2.2  PHOS 4.6  --   --   --  3.0 3.1 2.4* 2.6   GFR: Estimated Creatinine Clearance: 25.6 mL/min (A) (by C-G formula based on SCr of 1.6 mg/dL (H)). Recent Labs  Lab 08/08/23 1220 08/08/23 1431 08/08/23 1749 08/08/23 2209 08/09/23 0603 08/10/23 0420 08/10/23 1251 08/11/23 0407  PROCALCITON <0.10  --   --   --  2.04  --   --   --   WBC 4.9  --   --   --  8.9 11.5* 13.0* 14.3*  LATICACIDVEN >9.0* 8.4* 8.3* 3.5*  --   --   --   --     Liver Function Tests: Recent Labs  Lab 08/08/23 1220 08/09/23 0603 08/10/23 0420 08/11/23 0407  AST 271* 926* 564* 323*  ALT 95* 258* 234* 200*  ALKPHOS 65 59 67 73  BILITOT 0.8 1.4* 1.3* 1.2  PROT 6.3* 5.7* 5.1* 5.2*  ALBUMIN 2.7* 2.4* 2.1* 2.1*   Recent Labs  Lab 08/09/23 0603  LIPASE 18   Recent Labs  Lab 08/08/23 2209  AMMONIA 40*    ABG    Component Value Date/Time   PHART 7.577 (H) 08/09/2023 1108   PCO2ART 27.5 (L) 08/09/2023 1108   PO2ART 61 (L) 08/09/2023 1108   HCO3 25.6 08/09/2023 1108    TCO2 26 08/09/2023 1108   ACIDBASEDEF 2.0 08/08/2023 1742   O2SAT 95 08/09/2023 1108     Coagulation Profile: Recent Labs  Lab 08/08/23 1220 08/09/23 2039  INR 1.5* 1.5*    Cardiac Enzymes: No results for input(s): "CKTOTAL", "CKMB", "CKMBINDEX", "TROPONINI" in the last 168 hours.  HbA1C: Hgb A1c MFr Bld  Date/Time Value Ref Range Status  08/09/2023 10:54 AM 6.7 (H) 4.8 - 5.6 % Final    Comment:    (NOTE) Pre diabetes:          5.7%-6.4%  Diabetes:              >6.4%  Glycemic control for   <7.0% adults with diabetes     CBG: Recent Labs  Lab 08/10/23 1205 08/10/23 1610 08/10/23 1943 08/10/23 2323 08/11/23 0340  GLUCAP 163* 164* 139* 116* 116*    Review of Systems:   Unable to obtain. Sedated.  Past Medical History:  He,  has a past medical history of Alcoholism (HCC), Anemia, Atrial fibrillation (HCC), Carotid artery occlusion, Chronic back pain, Colitis, Dysrhythmia, Essential hypertension, Gout, Heart murmur, History of GI bleed, Hypothyroidism, Internal hemorrhoids, Osteoarthritis, Peripheral vascular disease (HCC), and Schatzki's ring.   Surgical History:   Past Surgical History:  Procedure Laterality Date   ABDOMINAL AORTOGRAM N/A 03/08/2019   Procedure: ABDOMINAL AORTOGRAM;  Surgeon: Sherren Kerns, MD;  Location: [name] INVASIVE CV LAB;  Service: Cardiovascular;  Laterality: N/A;   ABDOMINAL AORTOGRAM W/LOWER EXTREMITY N/A 03/02/2021   Procedure: ABDOMINAL AORTOGRAM W/LOWER EXTREMITY;  Surgeon: Nada Libman, MD;  Location: [facility] INVASIVE CV LAB;  Service: Cardiovascular;  Laterality: N/A;   ABDOMINAL AORTOGRAM W/LOWER EXTREMITY N/A 07/31/2023   Procedure: ABDOMINAL AORTOGRAM W/LOWER EXTREMITY;  Surgeon: Daria Pastures, MD;  Location: [facility] INVASIVE CV LAB;  Service: Cardiovascular;  Laterality: N/A;   APPLICATION OF WOUND VAC Right 07/22/2021   Procedure: APPLICATION OF WOUND VAC;  Surgeon: Nada Libman, MD;  Location: [facility] OR;  Service: Vascular;   Laterality: Right;   BIOPSY  10/20/2022   Procedure: BIOPSY;  Surgeon: Lanelle Bal, DO;  Location: [facility] ENDO SUITE;  Service: Endoscopy;;   COLONOSCOPY WITH PROPOFOL N/A 10/20/2022   Procedure: COLONOSCOPY WITH PROPOFOL;  Surgeon: Lanelle Bal, DO;  Location: [facility];  Service: Endoscopy;  Laterality: N/A;   COLONOSCOPY WITH PROPOFOL N/A 12/09/2022   Procedure: COLONOSCOPY WITH PROPOFOL;  Surgeon: Lanelle Bal, DO;  Location: [facility];  Service: Endoscopy;  Laterality: N/A;  10:15 AM, ASA 3   ENDARTERECTOMY Left 02/02/2022   Procedure: LEFT ENDARTERECTOMY CAROTID;  Surgeon: Nada Libman, MD;  Location: [facility] OR;  Service: Vascular;  Laterality: Left;   ESOPHAGOGASTRODUODENOSCOPY (EGD) WITH PROPOFOL N/A 10/20/2022   Procedure: ESOPHAGOGASTRODUODENOSCOPY (EGD) WITH PROPOFOL;  Surgeon: Lanelle Bal, DO;  Location: [facility];  Service: Endoscopy;  Laterality: N/A;   ESOPHAGOGASTRODUODENOSCOPY (EGD) WITH PROPOFOL N/A 02/07/2023   Procedure: ESOPHAGOGASTRODUODENOSCOPY (EGD) WITH PROPOFOL;  Surgeon: Meridee Score Netty Starring., MD;  Location: [facility] ENDOSCOPY;  Service: Gastroenterology;  Laterality: N/A;   EUS N/A 02/07/2023   Procedure: UPPER  ENDOSCOPIC ULTRASOUND (EUS) RADIAL;  Surgeon: Meridee Score Netty Starring., MD;  Location: [facility]COPY;  Service: Gastroenterology;  Laterality: N/A;   Excision of gouty lesion     Left index finger   FEMORAL-POPLITEAL BYPASS GRAFT Right 03/12/2021   Procedure: RIGHT FEMORAL TO POPLITEAL ARTERY BYPASS GRAFTING USING THE PROPATEN GRAFT;  Surgeon: Nada Libman, MD;  Location: [facility] OR;  Service: Vascular;  Laterality: Right;   FINE NEEDLE ASPIRATION N/A 02/07/2023   Procedure: FINE NEEDLE ASPIRATION (FNA) LINEAR;  Surgeon: Lemar Lofty., MD;  Location: [facility] ENDOSCOPY;  Service: Gastroenterology;  Laterality: N/A;   GROIN DEBRIDEMENT Right 07/22/2021   Procedure: RIGHT GROIN DEBRIDEMENT;  Surgeon: Nada Libman, MD;  Location: [facility] OR;   Service: Vascular;  Laterality: Right;   HOT HEMOSTASIS N/A 02/07/2023   Procedure: HOT HEMOSTASIS (ARGON PLASMA COAGULATION/BICAP);  Surgeon: Lemar Lofty., MD;  Location: [facility]OSCOPY;  Service: Gastroenterology;  Laterality: N/A;   INSERTION OF ILIAC STENT Right 03/12/2021   Procedure: INSERTION OF  EXTERNAL ILIAC STENT;  Surgeon: Nada Libman, MD;  Location: [facility] OR;  Service: Vascular;  Laterality: Right;   LOWER EXTREMITY ANGIOGRAM Right 03/12/2021   Procedure: LOWER EXTREMITY ANGIOGRAM;  Surgeon: Nada Libman, MD;  Location: [facility] OR;  Service: Vascular;  Laterality: Right;   LOWER EXTREMITY ANGIOGRAPHY Bilateral 03/08/2019   Procedure: Lower Extremity Angiography;  Surgeon: Sherren Kerns, MD;  Location: [facility] INVASIVE CV LAB;  Service: Cardiovascular;  Laterality: Bilateral;   LOWER EXTREMITY ANGIOGRAPHY N/A 03/15/2021   Procedure: LOWER EXTREMITY ANGIOGRAPHY;  Surgeon: Maeola Harman, MD;  Location: [facility] INVASIVE CV LAB;  Service: Cardiovascular;  Laterality: N/A;   MASS EXCISION Right 07/07/2020   Procedure: EXCISION OF RIGHT FACIAL MASS;  Surgeon: Newman Pies, MD;  Location:  SURGERY CENTER;  Service: ENT;  Laterality: Right;   PATCH ANGIOPLASTY Left 02/02/2022   Procedure: PATCH ANGIOPLASTY OF LEFT CAROTID USING XENOSURE BOVINE PERICARDIUM PATCH;  Surgeon: Nada Libman, MD;  Location: [facility] OR;  Service: Vascular;  Laterality: Left;   PERIPHERAL VASCULAR BALLOON ANGIOPLASTY Right 03/02/2021   Procedure: PERIPHERAL VASCULAR BALLOON ANGIOPLASTY;  Surgeon: Nada Libman, MD;  Location: [facility] INVASIVE CV LAB;  Service: Cardiovascular;  Laterality: Right;  external iliac   PERIPHERAL VASCULAR INTERVENTION Bilateral 03/08/2019   Procedure: PERIPHERAL VASCULAR INTERVENTION;  Surgeon: Sherren Kerns, MD;  Location: [facility] INVASIVE CV LAB;  Service: Cardiovascular;  Laterality: Bilateral;  ext iliac artery stent   PERIPHERAL VASCULAR INTERVENTION Left 03/02/2021   Procedure:  PERIPHERAL VASCULAR INTERVENTION;  Surgeon: Nada Libman, MD;  Location: [facility] INVASIVE CV LAB;  Service: Cardiovascular;  Laterality: Left;  external iliac   PERIPHERAL VASCULAR INTERVENTION Right 03/15/2021   Procedure: PERIPHERAL VASCULAR INTERVENTION;  Surgeon: Maeola Harman, MD;  Location: [facility] INVASIVE CV LAB;  Service: Cardiovascular;  Laterality: Right;  external iliac   POLYPECTOMY  12/09/2022   Procedure: POLYPECTOMY INTESTINAL;  Surgeon: Lanelle Bal, DO;  Location: [facility];  Service: Endoscopy;;   RIGHT HEART CATH N/A 06/12/2023   Procedure: RIGHT HEART CATH;  Surgeon: Romie Minus, MD;  Location: [facility] INVASIVE CV LAB;  Service: Cardiovascular;  Laterality: N/A;   Right knee arthroscopy     SPINE SURGERY       Social History:   reports that he has been smoking cigarettes. He has been exposed to tobacco smoke. He has never used smokeless tobacco. He reports current alcohol use of about 5.0 standard drinks of alcohol per  week. He reports that he does not use drugs.   Family History:  His family history includes Coronary artery disease in his father and sister.   Allergies No Known Allergies   Home Medications  Prior to Admission medications   Medication Sig Start Date End Date Taking? Authorizing Provider  aspirin EC 81 MG EC tablet Take 1 tablet (81 mg total) by mouth daily at 6 (six) AM. Swallow whole. 03/17/21   Setzer, Lynnell Jude, PA-C  cholecalciferol (VITAMIN D3) 25 MCG (1000 UNIT) tablet Take 1,000 Units by mouth daily.    [provider]  clopidogrel (PLAVIX) 75 MG tablet TAKE ONE TABLET BY MOUTH ONCE DAILY. 05/05/23   Jonelle Sidle, MD  diclofenac Sodium (VOLTAREN) 1 % GEL Apply 4 g topically 4 (four) times daily as needed (pain). 01/31/23   [provider]  Ensure (ENSURE) Take 1 Can by mouth daily.    [provider]  FARXIGA 10 MG TABS tablet Take 1 tablet (10 mg total) by mouth daily before breakfast. 05/30/23    Romie Minus, MD  furosemide (LASIX) 20 MG tablet Take 1 tablet (20 mg total) by mouth daily. 06/12/23   Romie Minus, MD  levothyroxine (SYNTHROID) 25 MCG tablet Take 25 mcg by mouth daily before breakfast.    [provider]  lipase/protease/amylase (CREON) 36000 UNITS CPEP capsule Take 2 capsules (72,000 Units total) by mouth 3 (three) times daily before meals. And 1 capsule with snacks 03/15/23 03/14/24  Lanelle Bal, DO  pantoprazole (PROTONIX) 40 MG tablet Take 1 tablet (40 mg total) by mouth 2 (two) times daily. 11/03/22 11/03/23  Lanelle Bal, DO  rosuvastatin (CRESTOR) 10 MG tablet TAKE (1) TABLET BY MOUTH ONCE DAILY. 09/09/20   Maeola Harman, MD     Critical care time:

## 2023-08-11 NOTE — Procedures (Addendum)
Patient Name: Todd [name]  MRN: [id number]  Epilepsy Attending: Charlsie Quest  Referring Physician/Provider: Erick Blinks, MD  Duration: 08/10/2023 4098 to 08/11/2023 1191   Patient history: 73 year old male status post cardiac arrest.  EEG to evaluate for seizure.   Level of alertness: comatose   AEDs during EEG study: LEV, versed   Technical aspects: This EEG study was done with scalp electrodes positioned according to the 10-20 International system of electrode placement. Electrical activity was reviewed with band pass filter of 1-70Hz , sensitivity of 7 uV/mm, display speed of 52mm/sec with a 60Hz  notched filter applied as appropriate. EEG data were recorded continuously and digitally stored.  Video monitoring was not available due to technical issues   Description: EEG showed near continuous generalized 3 to 5 Hz theta-delta slowing admixed with 1 to 3 seconds of generalized EEG attenuation.  Initially during the study, when patient was stimulated, EEG showed generalized periodic discharges at 1 Hz with at times overriding rhythmicity. Gradually, EEG worsened and  generalized periodic epileptiform discharges at 1Hz  even without stimulation.   Event button was pressed on 08/10/2023 at 1439 for twitching in right shoulder. Concomitant EEG showed generalized  periodic discharges at 0.25-1hz .  In the setting of cardiac arrest, this EEG pattern is concerning for myoclonic seizures.    ABNORMALITY -Periodic epileptiform discharges, generalized -Continuous slow, generalized   IMPRESSION: This study showed evidence of epileptogenicity with generalized onset and increased risk of seizure recurrence.  Additionally there is severe diffuse encephalopathy.   Event button was pressed on 08/10/2023 at 1439 for twitching in right shoulder. Concomitant EEG showed generalized periodic discharges  at 0.25-1hz . In the setting of cardiac arrest, this EEG pattern is concerning for myoclonic  seizures    [name]

## 2023-08-12 ENCOUNTER — Inpatient Hospital Stay (HOSPITAL_COMMUNITY): Payer: PPO

## 2023-08-12 DIAGNOSIS — J151 Pneumonia due to Pseudomonas: Secondary | ICD-10-CM

## 2023-08-12 DIAGNOSIS — G931 Anoxic brain damage, not elsewhere classified: Secondary | ICD-10-CM | POA: Diagnosis not present

## 2023-08-12 DIAGNOSIS — G253 Myoclonus: Secondary | ICD-10-CM | POA: Diagnosis not present

## 2023-08-12 DIAGNOSIS — I272 Pulmonary hypertension, unspecified: Secondary | ICD-10-CM | POA: Diagnosis not present

## 2023-08-12 DIAGNOSIS — R569 Unspecified convulsions: Secondary | ICD-10-CM | POA: Diagnosis not present

## 2023-08-12 DIAGNOSIS — I469 Cardiac arrest, cause unspecified: Secondary | ICD-10-CM | POA: Diagnosis not present

## 2023-08-12 LAB — COMPREHENSIVE METABOLIC PANEL
ALT: 148 U/L — ABNORMAL HIGH (ref 0–44)
AST: 204 U/L — ABNORMAL HIGH (ref 15–41)
Albumin: 2.2 g/dL — ABNORMAL LOW (ref 3.5–5.0)
Alkaline Phosphatase: 78 U/L (ref 38–126)
Anion gap: 15 (ref 5–15)
BUN: 44 mg/dL — ABNORMAL HIGH (ref 8–23)
CO2: 26 mmol/L (ref 22–32)
Calcium: 7.9 mg/dL — ABNORMAL LOW (ref 8.9–10.3)
Chloride: 103 mmol/L (ref 98–111)
Creatinine, Ser: 1.66 mg/dL — ABNORMAL HIGH (ref 0.61–1.24)
GFR, Estimated: 43 mL/min — ABNORMAL LOW (ref >60)
Glucose, Bld: 188 mg/dL — ABNORMAL HIGH (ref 70–99)
Potassium: 3.8 mmol/L (ref 3.5–5.1)
Sodium: 144 mmol/L (ref 135–145)
Total Bilirubin: 1 mg/dL (ref 0.0–1.2)
Total Protein: 5.3 g/dL — ABNORMAL LOW (ref 6.5–8.1)

## 2023-08-12 LAB — GLUCOSE, CAPILLARY
Glucose-Capillary: 102 mg/dL — ABNORMAL HIGH (ref 70–99)
Glucose-Capillary: 87 mg/dL (ref 70–99)
Glucose-Capillary: 155 mg/dL — ABNORMAL HIGH (ref 70–99)
Glucose-Capillary: 154 mg/dL — ABNORMAL HIGH (ref 70–99)
Glucose-Capillary: 155 mg/dL — ABNORMAL HIGH (ref 70–99)
Glucose-Capillary: 80 mg/dL (ref 70–99)
Glucose-Capillary: 87 mg/dL (ref 70–99)

## 2023-08-12 LAB — TRIGLYCERIDES: Triglycerides: 84 mg/dL (ref <150)

## 2023-08-12 LAB — CBC
HCT: 33.7 % — ABNORMAL LOW (ref 39.0–52.0)
Hemoglobin: 10.2 g/dL — ABNORMAL LOW (ref 13.0–17.0)
MCH: 27.6 pg (ref 26.0–34.0)
MCHC: 30.3 g/dL (ref 30.0–36.0)
MCV: 91.1 fL (ref 80.0–100.0)
Platelets: 70 10*3/uL — ABNORMAL LOW (ref 150–400)
RBC: 3.7 MIL/uL — ABNORMAL LOW (ref 4.22–5.81)
RDW: 19.5 % — ABNORMAL HIGH (ref 11.5–15.5)
WBC: 14.1 10*3/uL — ABNORMAL HIGH (ref 4.0–10.5)
nRBC: 2.6 % — ABNORMAL HIGH (ref 0.0–0.2)

## 2023-08-12 LAB — MAGNESIUM: Magnesium: 2.1 mg/dL (ref 1.7–2.4)

## 2023-08-12 LAB — AMMONIA: Ammonia: 29 umol/L (ref 9–35)

## 2023-08-12 MED ORDER — HEPARIN (PORCINE) 25000 UT/250ML-% IV SOLN
650.0000 [IU]/h | INTRAVENOUS | Status: DC
  Administered 2023-08-12: 650 [IU]/h via INTRAVENOUS
  Filled 2023-08-12: qty 250

## 2023-08-12 MED ORDER — VALPROATE SODIUM 100 MG/ML IV SOLN
250.0000 mg | Freq: Three times a day (TID) | INTRAVENOUS | Status: DC
  Administered 2023-08-12 – 2023-08-14 (×6): 250 mg via INTRAVENOUS
  Filled 2023-08-12 (×4): qty 250
  Filled 2023-08-12 (×5): qty 2.5

## 2023-08-12 MED ORDER — DEXMEDETOMIDINE HCL IN NACL 400 MCG/100ML IV SOLN
0.0000 ug/kg/h | INTRAVENOUS | Status: DC
  Administered 2023-08-12: 0.4 ug/kg/h via INTRAVENOUS
  Filled 2023-08-12: qty 100

## 2023-08-12 NOTE — Progress Notes (Signed)
NAME:  Todd [name], MRN:  [id number], DOB:  Mar 26, 1951, LOS: 4 ADMISSION DATE:  08/08/2023, CONSULTATION DATE:  08/08/2023 REFERRING MD:  Dr. Rhunette Croft, CHIEF COMPLAINT:  cardiac arrest   History of Present Illness:  Pt encephalopathic, therefore HPI obtained from EMR.    65 yoM with PMH of tobacco abuse, HTN, atrial fibrillation (not on AC given prior GIBs), pulmonary hypertension, HLD, ETOH abuse w/hx of withdrawal seizures, PAD, hypothyroidism, GERD, chronic pancreatitis, pancreatic duct dilation/ IPMN, and suspected cirrhosis presenting to [facility] ER due to altered mental status.  Family reports ongoing exertional SOB since recent discharge.  Recent hospitalization for 1/30- 2/5 left foot pain with ulcers, AKI, and SOB with anasarca treated with IV diureses;  underwent balloon angioplasty with left SFA stent placement with vascular surgery on 2/3.  Wife reported concerns of AMS, SBP 80's, with shaking but pt had returned to his baseline by EMS arrival and refused transport.  An hour later, pt was in respiratory distress and hypoxic.  By arrival to ER by EMS, HR had dropped to 20's and become pulseless.  ACLS measures provided including CPR, intubation, with ROSC after 15 mins.    ER workup significant for plts 82, AG 18, iCa 0.8, AST/ ALT 271/ 95, BNP 1697 (1769 on 1/30), trop 86, lactic > 9, INR 1.5, CXR showing extensive interstitial opacities favoring pulmonary edema with right small to moderate pleural effusion.  SARS 2 positive.  ABG 7.08/ 66/ 97/ 19.7.  Requiring vasopressor support.  CVL placed.  To be transferred to [facility] for higher level of care, PCCM accepted.   Pertinent  Medical History  Tobacco abuse, HTN, atrial fibrillation (not on AC given prior GIBs), pulmonary hypertension, HLD, ETOH abuse w/hx of withdrawal seizures, PAD, hypothyroidism, GERD, chronic pancreatitis, pancreatic duct dilation/ IPMN, suspected cirrhosis  Significant Hospital Events: Including procedures,  antibiotic start and stop dates in addition to other pertinent events   2/11 admit post arrest, intubated, transfer to [facility] ICU 2/13 seizure activity, versed bolus and valproate x1, bloody BM  Interim History / Subjective:   cEEG report shows diffuse encephalopathy and generalized periodic discharges that do not appear epileptiform but are concerning for hypoxic brain injury. He is having stimulus induced myoclonus per event review.   No acute events overnight. MRI brain scheduled for today  Objective   Blood pressure 99/75, pulse (!) 102, temperature 99.5 F (37.5 C), resp. rate (!) 24, height 5' 7.99" (1.727 m), weight 42.5 kg, SpO2 100%. CVP:  [1 mmHg-6 mmHg] 5 mmHg  Vent Mode: PRVC FiO2 (%):  [60 %] 60 % Set Rate:  [24 bmp] 24 bmp Vt Set:  [550 mL] 550 mL PEEP:  [10 cmH20] 10 cmH20 Plateau Pressure:  [21 cmH20-27 cmH20] 27 cmH20   Intake/Output Summary (Last 24 hours) at 08/12/2023 4098 Last data filed at 08/12/2023 0547 Gross per 24 hour  Intake 1385.81 ml  Output 2425 ml  Net -1039.19 ml   Filed Weights   08/08/23 1653 08/09/23 0600 08/12/23 0500  Weight: 44.9 kg 44.1 kg 42.5 kg    Examination: General: sedated, in NAD HENT: ETT in place Lungs: diminished breath sounds, no wheezing Cardiovascular: irregular rhythm, normal rate Abdomen: bowel sounds present, soft, non-distended Extremities: wounds of b/l LE with dressing in place, noted pitting LE edema  Neuro: opens eyes slightly intermittently, no purposeful movement, slight cough reflex with suction, corneal intact GU: foley in place  Resolved Hospital Problem list     Assessment & Plan:  Acute hypoxic and hypercarbic respiratory failure Pulmonary Hypertension SARS 2 positive vs pulmonary edema vs r/o CAP Pseudomonas pneumonia Tobacco abuse - full MV support, 4-8cc/kg IBW with goal Pplat <30 and DP<15  - VAP prevention protocol/ PPI - PAD protocol for sedation> versed, prn fentanyl.  RASS goal 0/-1 - adjust  peep/ FiO2 as able for SpO2 >90%  - bronchodilators > brovana, pulmicort, yupleri and prn albuterol  - solumedrol 40mg  daily x 5 days - zosyn x 7 days  - lasix 40mg  daily   Seizures vs Myoclonic activity Acute metabolic encephalopathy Hx of ETOH abuse- wife reports pt has not been drinking recently  CTH negative for acute intracranial process. EEG showed myoclonic sz every 10-30 seconds early 2/12.  - maintain neuro protective measures; goal for eurothermia, euglycemia, eunatermia, normoxia, and PCO2 goal of 35-40 - neurology following, appreciate assistance   - Keppra 750 BID  - Valproate 500mg  TID - Versed PRN - Klonopin 1 mg TID - Repeat MRI brain wo in 72 hrs, plan for 2/15 - Seizure precautions   - empiric thiamine   Cardiac arrest- suspect respiratory etiology. With reports of shaking, r/o seizure 2/2 hypoxia, hypoperfusion vs ?ETOH withdrawal seizure - cont tele - does not appear to be ischemic     BRBPR, resolved Internal hemorrhoids Noted bloody BM 2/13 overnight. CBC remains stable. 2024 colonoscopy with internal hemorrhoids and removed polyps tubular adenoma. EGD then showed non-bleeding gastric ulcer. No further episodes of bloody BM, last charted brown.  - Trend CBC daily  - On PPI  Afib, permanent  - rate controlled  - not on AC due to prior GIBs, prior cardioversion's failed - optimize electrolytes  - if RVR, may need to consider amio drip    AKI  Normal renal function in December. CXR with pulmonary edema, getting diuresis.   - trend BMP - cont foley  - strict I/Os, daily wts - consider renal US/ lytes if worsening  - avoid nephrotoxins, renal dose meds, hemodynamic support as above   Transaminitis Anasarca  Cirrhosis  AST/ALT significantly elevated. Hx pancreatic duct dilation/ IPMN, chronic pancreatitis. Suspect shock liver +/- hepatic congestion, prior concern for cirrhosis on RUQ imaging. lipase normal. Ongoing LE edema.  - trend CMP  - LFTs  improving  - On IV lasix - Lactulose 20 g TID  Chronic normocytic anemia Thrombocytopenia - H/H at baseline.  Recent plt 160. Fluctuating at 88 today.  Could be related to liver disease.  PTA ASA/ plavix which are held. - trend CBC, transfuse for Hgb < 7 - hold DAPT    GERD Hx gastric ulcer - PPI    Hypothyroidism  TSH 0.967 on 07/04/23. Not taking synthroid per fill records.    Severe PAD prior recent left SFA balloon angioplasty with stent - Holding home ASA/ plavix    Sacral pressure wound, POA BLE foot wounds Protein-calorie malnutrition - hx of ongoing wt loss, suspect his anasarca is masking weight loss - WOC consult  - RD consult     IV infiltration RUE of vasopressors - phentolamine and elevation per protocol    Poor mobility/ deconditioning  - reports limited mobility for months secondary to PAD, suspect PH, and other factors also contributing  - PT/ OT when appropriate    GOC - wife and children present on admission.  Remains full code for now, aware of MODS and high risk for further deterioration.  Has living will, would not want trach or PEG. Monitor him to see  if significant improvement, otherwise continue GOC and consider PMT.   Best Practice (right click and "Reselect all SmartList Selections" daily)   Diet/type: tubefeeds DVT prophylaxis prophylactic heparin   Pressure ulcer(s): present on admission  GI prophylaxis: PPI Lines: Central line and Arterial Line Foley:  Yes, and it is still needed Code Status:  full code Last date of multidisciplinary goals of care discussion [updated daughter 2/15]  Labs   CBC: Recent Labs  Lab 08/09/23 0603 08/09/23 1108 08/10/23 0420 08/10/23 1251 08/11/23 0407 08/12/23 0437  WBC 8.9  --  11.5* 13.0* 14.3* 14.1*  HGB 10.1* 10.5* 11.5* 11.4* 10.9* 10.2*  HCT 32.7* 31.0* 36.0* 35.9* 35.6* 33.7*  MCV 89.3  --  87.6 87.3 89.0 91.1  PLT 118*  --  129* 111* 88* 70*    Basic Metabolic Panel: Recent Labs  Lab  08/08/23 1220 08/08/23 1742 08/09/23 0603 08/09/23 1108 08/09/23 1700 08/09/23 2039 08/10/23 0420 08/11/23 0407 08/12/23 0437  NA 136  135   < > 136 138  --   --  138 140 144  K 4.4  4.4   < > 3.9 3.9  --   --  3.7 3.7 3.8  CL 96*  102  --  100  --   --   --  101 103 103  CO2 22  --  22  --   --   --  23 24 26   GLUCOSE 141*  130*  --  153*  --   --   --  219* 227* 188*  BUN 24*  24*  --  27*  --   --   --  34* 42* 44*  CREATININE 1.50*  1.60*  --  1.68*  --   --   --  1.66* 1.60* 1.66*  CALCIUM 7.6*  --  7.7*  --   --   --  7.7* 7.5* 7.9*  MG 2.2  --  1.8  --  2.5*  --  2.2 2.2 2.1  PHOS 4.6  --   --   --  3.0 3.1 2.4* 2.6  --    < > = values in this interval not displayed.   GFR: Estimated Creatinine Clearance: 23.8 mL/min (A) (by C-G formula based on SCr of 1.66 mg/dL (H)). Recent Labs  Lab 08/08/23 1220 08/08/23 1431 08/08/23 1749 08/08/23 2209 08/09/23 0603 08/10/23 0420 08/10/23 1251 08/11/23 0407 08/12/23 0437  PROCALCITON <0.10  --   --   --  2.04  --   --   --   --   WBC 4.9  --   --   --  8.9 11.5* 13.0* 14.3* 14.1*  LATICACIDVEN >9.0* 8.4* 8.3* 3.5*  --   --   --   --   --     Liver Function Tests: Recent Labs  Lab 08/08/23 1220 08/09/23 0603 08/10/23 0420 08/11/23 0407 08/12/23 0437  AST 271* 926* 564* 323* 204*  ALT 95* 258* 234* 200* 148*  ALKPHOS 65 59 67 73 78  BILITOT 0.8 1.4* 1.3* 1.2 1.0  PROT 6.3* 5.7* 5.1* 5.2* 5.3*  ALBUMIN 2.7* 2.4* 2.1* 2.1* 2.2*   Recent Labs  Lab 08/09/23 0603  LIPASE 18   Recent Labs  Lab 08/08/23 2209 08/12/23 0436  AMMONIA 40* 29    ABG    Component Value Date/Time   PHART 7.577 (H) 08/09/2023 1108   PCO2ART 27.5 (L) 08/09/2023 1108   PO2ART 61 (L) 08/09/2023 1108   HCO3  25.6 08/09/2023 1108   TCO2 26 08/09/2023 1108   ACIDBASEDEF 2.0 08/08/2023 1742   O2SAT 95 08/09/2023 1108     Coagulation Profile: Recent Labs  Lab 08/08/23 1220 08/09/23 2039  INR 1.5* 1.5*    Cardiac  Enzymes: No results for input(s): "CKTOTAL", "CKMB", "CKMBINDEX", "TROPONINI" in the last 168 hours.  HbA1C: Hgb A1c MFr Bld  Date/Time Value Ref Range Status  08/09/2023 10:54 AM 6.7 (H) 4.8 - 5.6 % Final    Comment:    (NOTE) Pre diabetes:          5.7%-6.4%  Diabetes:              >6.4%  Glycemic control for   <7.0% adults with diabetes     CBG: Recent Labs  Lab 08/11/23 1737 08/11/23 2004 08/11/23 2303 08/12/23 0308 08/12/23 0730  GLUCAP 171* 143* 151* 102* 87      Critical care time: 35 minutes    Melody Comas, MD [facility] Office: [phone]   See Amion for personal pager PCCM on call pager [phone] until 7pm. Please call Elink 7p-7a. [phone]

## 2023-08-12 NOTE — Progress Notes (Signed)
RN noticed patient's heart rate and blood pressure elevated. RN went in the assess patient and found patient have seizure like activity. EEG button pushed and per order Versed 2mg  given. Elink-Stefanie, RN notified.

## 2023-08-12 NOTE — Progress Notes (Signed)
Subjective: Event of shoulder jerking overnight. Daughter at bedside  ROS: Unable to obtain due to poor mental status  Examination  Vital signs in last 24 hours: Temp:  [98.4 F (36.9 C)-99.7 F (37.6 C)] 98.4 F (36.9 C) (02/15 1400) Pulse Rate:  [100-195] 109 (02/15 1452) Resp:  [11-36] 27 (02/15 1452) SpO2:  [77 %-100 %] 100 % (02/15 1452) Arterial Line BP: (79-146)/(51-80) 101/51 (02/15 1400) FiO2 (%):  [50 %-60 %] 50 % (02/15 1452) Weight:  [42.5 kg] 42.5 kg (02/15 0500)  General: lying in bed, intubated Extremities: Bluish discoloration of  all extremities Neuro: off sedation, last Versed dose was 2mg  about 5 hours prior to exam, to tactile stimulation, partial eye opening, spontaneous blinking. Does not make eye contact. does not follow commands, pupils equally round reactive to light, no forced gaze deviation, corneal reflex intact, cough reflex present, gag absent. To nares stimulation, he lifts his head up off the bed and has a bout of cough. does not withdraw to noxious stimuli in BL UE, or in BL LE  Basic Metabolic Panel: Recent Labs  Lab 08/08/23 1220 08/08/23 1742 08/09/23 0603 08/09/23 1108 08/09/23 1700 08/09/23 2037/08/26 08/10/23 0420 08/11/23 0407 08/12/23 0437  NA 136  135   < > 136 138  --   --  138 140 144  K 4.4  4.4   < > 3.9 3.9  --   --  3.7 3.7 3.8  CL 96*  102  --  100  --   --   --  101 103 103  CO2 22  --  22  --   --   --  23 24 26   GLUCOSE 141*  130*  --  153*  --   --   --  219* 227* 188*  BUN 24*  24*  --  27*  --   --   --  34* 42* 44*  CREATININE 1.50*  1.60*  --  1.68*  --   --   --  1.66* 1.60* 1.66*  CALCIUM 7.6*  --  7.7*  --   --   --  7.7* 7.5* 7.9*  MG 2.2  --  1.8  --  2.5*  --  2.2 2.2 2.1  PHOS 4.6  --   --   --  3.0 3.1 2.4* 2.6  --    < > = values in this interval not displayed.    CBC: Recent Labs  Lab 08/09/23 0603 08/09/23 1108 08/10/23 0420 08/10/23 1251 08/11/23 0407 08/12/23 0437  WBC 8.9  --  11.5* 13.0*  14.3* 14.1*  HGB 10.1* 10.5* 11.5* 11.4* 10.9* 10.2*  HCT 32.7* 31.0* 36.0* 35.9* 35.6* 33.7*  MCV 89.3  --  87.6 87.3 89.0 91.1  PLT 118*  --  129* 111* 88* 70*     Coagulation Studies: Recent Labs    08/09/23 August 26, 2037  LABPROT 18.7*  INR 1.5*    Imaging No new brain imaging  ASSESSMENT AND PLAN: 73 year old male admitted to ICU after PEA arrest with high-quality CPR with ROSC in 15 minutes.   Cardiac arrest Transaminitis -Patient has had myoclonic seizures, worse with stimulation   Recommendations - Off Versed. -Decrease Versed to 250mg  Q8H given decline in platelet count -Continue Klonopin 1 mg 3 times daily, Keppra 750 mg twice daily (maximal dose for current renal function) -Continue EEG monitoring for now since we reduced VPA dosing. -Plan for MRI brain without contrast tomorrow. - Discussed with daughter that  after we wean sedation, we wait for improvement in mental status. If sz recur and patient doesn't wake up, then we would be concerned about anoxic brian damage. Patient has previously expressed to family that he wouldn't want trach and daughter again expressed today that if his quality if life is worse than before, then he wouldn't want to continue care -Discussed plan in detail with patient's daughter at bedside -Discussed plan with ICU team     Erick Blinks [facility]

## 2023-08-12 NOTE — Progress Notes (Signed)
PCCM Update:  Concern for right radial thrombosis due to arterial catheter. Start empiric heparin drip per pharmacy. Check CTA of right hand/upper extremity.  Melody Comas, MD  Pulmonary & Critical Care Office: [phone]   See Amion for personal pager PCCM on call pager [phone] until 7pm. Please call Elink 7p-7a. [phone]

## 2023-08-12 NOTE — Procedures (Signed)
Patient Name: Todd [name]  MRN: [id number]  Epilepsy Attending: Charlsie Quest  Referring Physician/Provider: Erick Blinks, MD  Duration: 08/11/2023 2130 to 08/12/2023 8657   Patient history: 73 year old male status post cardiac arrest.  EEG to evaluate for seizure.   Level of alertness: comatose   AEDs during EEG study: LEV, Clonazepam, VPA   Technical aspects: This EEG study was done with scalp electrodes positioned according to the 10-20 International system of electrode placement. Electrical activity was reviewed with band pass filter of 1-70Hz , sensitivity of 7 uV/mm, display speed of 59mm/sec with a 60Hz  notched filter applied as appropriate. EEG data were recorded continuously and digitally stored.  Video monitoring was not available due to technical issues   Description: EEG showed near continuous generalized 3 to 5 Hz theta-delta slowing admixed with 1 to 3 seconds of generalized EEG attenuation. Generalized periodic discharges at 1Hz  were also noted at times, more prominent when stimulated.   Event button was pressed on 08/11/2023 at 1352 for generalized stiffness. Concomitant EEG didn't show any definite eeg change to suggest seizure.   Event button was pressed on 08/11/2023 at 0455 for generalized stiffness and upper extremity jerking. Concomitant EEG showed generalized  periodic discharges at 0.5-1hz  without definite evolution.   ABNORMALITY -Periodic discharges, generalized -Continuous slow, generalized   IMPRESSION: This study is suggestive of moderate to severe diffuse encephalopathy. Generalized periodic discharges were noted which are improving in morphology and do not appear epileptiform anymore. These discharges could be secondary to hypoxic brain injury.    Event button was pressed on 08/11/2023 at 1352 for generalized stiffness without concomitant EEG change. This was not an epileptic event.  Event button was pressed on 08/11/2023 at 0455 for generalized  stiffness and upper extremity jerking. Concomitant EEG showed generalized  periodic discharges at 0.5-1hz  without definite evolution. This was most likely not an epileptic event. Stimulation induced myoclonus can have similar appearence.    Semisi Biela Annabelle Harman

## 2023-08-12 NOTE — Progress Notes (Signed)
PHARMACY - ANTICOAGULATION CONSULT NOTE  Pharmacy Consult for heparin Indication: right radial thrombosis  No Known Allergies  Patient Measurements: Height: 5' 7.99" (172.7 cm) Weight: 42.5 kg (93 lb 11.1 oz) IBW/kg (Calculated) : 68.38   Vital Signs: Temp: 99.7 F (37.6 C) (02/15 1800) BP: 113/65 (02/15 1800) Pulse Rate: 109 (02/15 1452)  Labs: Recent Labs    08/09/23 2039 08/10/23 0420 08/10/23 0420 08/10/23 1251 08/11/23 0407 08/12/23 0437  HGB  --  11.5*   < > 11.4* 10.9* 10.2*  HCT  --  36.0*   < > 35.9* 35.6* 33.7*  PLT  --  129*   < > 111* 88* 70*  LABPROT 18.7*  --   --   --   --   --   INR 1.5*  --   --   --   --   --   CREATININE  --  1.66*  --   --  1.60* 1.66*   < > = values in this interval not displayed.    Estimated Creatinine Clearance: 23.8 mL/min (A) (by C-G formula based on SCr of 1.66 mg/dL (H)).   Medical History: Past Medical History:  Diagnosis Date   Alcoholism (HCC)    History of withdrawal and seizures   Anemia    Atrial fibrillation (HCC)    Taken off of Coumadin in 2009 in the setting of GI bleed   Carotid artery occlusion    Chronic back pain    Colitis    4/11   Dysrhythmia    Essential hypertension    Gout    Heart murmur    born with no issues   History of GI bleed    Hypothyroidism    Internal hemorrhoids    Colonoscopy 11/09   Osteoarthritis    Peripheral vascular disease (HCC)    Schatzki's ring    EGD 11/09    Medications:  Medications Prior to Admission  Medication Sig Dispense Refill Last Dose/Taking   aspirin EC 81 MG EC tablet Take 1 tablet (81 mg total) by mouth daily at 6 (six) AM. Swallow whole. 30 tablet 11 08/08/2023 Morning   cholecalciferol (VITAMIN D3) 25 MCG (1000 UNIT) tablet Take 1,000 Units by mouth daily.   Past Week   clopidogrel (PLAVIX) 75 MG tablet TAKE ONE TABLET BY MOUTH ONCE DAILY. 90 tablet 1 08/08/2023 Morning   diclofenac Sodium (VOLTAREN) 1 % GEL Apply 4 g topically 4 (four) times  daily as needed (pain).   Taking As Needed   doxycycline (VIBRAMYCIN) 100 MG capsule Take 100 mg by mouth 2 (two) times daily.   08/08/2023 Morning   FARXIGA 10 MG TABS tablet Take 1 tablet (10 mg total) by mouth daily before breakfast. 90 tablet 3 08/08/2023 Morning   furosemide (LASIX) 20 MG tablet Take 1 tablet (20 mg total) by mouth daily. (Patient taking differently: Take 60 mg by mouth daily.) 30 tablet 11 08/08/2023 Morning   lipase/protease/amylase (CREON) 36000 UNITS CPEP capsule Take 2 capsules (72,000 Units total) by mouth 3 (three) times daily before meals. And 1 capsule with snacks 240 capsule 11 08/08/2023 Morning   metolazone (ZAROXOLYN) 2.5 MG tablet Take 2.5 mg by mouth daily.   08/08/2023 Morning   potassium chloride SA (KLOR-CON M) 20 MEQ tablet Take 20 mEq by mouth daily.   08/08/2023 Morning   rosuvastatin (CRESTOR) 10 MG tablet TAKE (1) TABLET BY MOUTH ONCE DAILY. 30 tablet 0 08/07/2023   Ensure (ENSURE) Take 1 Can by  mouth daily.      levothyroxine (SYNTHROID) 25 MCG tablet Take 25 mcg by mouth daily before breakfast. (Patient not taking: Reported on 08/09/2023)   Not Taking   pantoprazole (PROTONIX) 40 MG tablet Take 1 tablet (40 mg total) by mouth 2 (two) times daily. (Patient not taking: Reported on 08/09/2023) 60 tablet 11 Not Taking   Scheduled:   arformoterol  15 mcg Nebulization BID   budesonide (PULMICORT) nebulizer solution  0.5 mg Nebulization BID   Chlorhexidine Gluconate Cloth  6 each Topical Daily   clonazePAM  1 mg Per Tube TID   feeding supplement (PROSource TF20)  60 mL Per Tube Daily   heparin injection (subcutaneous)  5,000 Units Subcutaneous Q8H   insulin aspart  0-15 Units Subcutaneous Q4H   lactulose  20 g Per Tube TID   leptospermum manuka honey  1 Application Topical Daily   methylPREDNISolone (SOLU-MEDROL) injection  40 mg Intravenous Q24H   mouth rinse  15 mL Mouth Rinse Q2H   pantoprazole (PROTONIX) IV  40 mg Intravenous QHS   revefenacin  175 mcg  Nebulization Daily   thiamine (VITAMIN B1) injection  100 mg Intravenous Daily    Assessment: 73 yo male with possible right radial thrombosis from an arterial catheter. He is noted s/p cardiac arrest on 2/11 with myoclonic seizures. He is also noted with recent bloody BM on 2/13 that has resolved. Pharmacy consulted to dose heparin with concern of thrombosis  -heparin sq given ~ 2pm -hg= 10.2, plt= 70 (82 on 2/11)  Goal of Therapy:  Heparin level 0.3-0.7 units/ml Monitor platelets by anticoagulation protocol: Yes   Plan:  -No heparin bolus due to recent sq heparin  -Start IV heparin at 650 units/hr -heparin level in 8 hrs  Harland German, PharmD Clinical Pharmacist **Pharmacist phone directory can now be found on [email or web address] ([email or web address]).  Listed under [facility].

## 2023-08-12 NOTE — Progress Notes (Signed)
CCM called to bedside to assess RUE w art line. After this RN noticed pale extremity. Per CCM okay to pull

## 2023-08-12 NOTE — Progress Notes (Signed)
CCM called to bedside to assess pt after initiating precedex due to drop in pt's SBP into the 70s. Per CCM just continue to assess SBP and reach back out w/in 10-15 min timeframe

## 2023-08-13 ENCOUNTER — Inpatient Hospital Stay (HOSPITAL_COMMUNITY): Payer: PPO

## 2023-08-13 DIAGNOSIS — G931 Anoxic brain damage, not elsewhere classified: Secondary | ICD-10-CM | POA: Diagnosis not present

## 2023-08-13 DIAGNOSIS — G253 Myoclonus: Secondary | ICD-10-CM | POA: Diagnosis not present

## 2023-08-13 DIAGNOSIS — I469 Cardiac arrest, cause unspecified: Secondary | ICD-10-CM | POA: Diagnosis not present

## 2023-08-13 DIAGNOSIS — I482 Chronic atrial fibrillation, unspecified: Secondary | ICD-10-CM

## 2023-08-13 DIAGNOSIS — R569 Unspecified convulsions: Secondary | ICD-10-CM | POA: Diagnosis not present

## 2023-08-13 LAB — CBC
HCT: 32.6 % — ABNORMAL LOW (ref 39.0–52.0)
Hemoglobin: 9.6 g/dL — ABNORMAL LOW (ref 13.0–17.0)
MCH: 27.4 pg (ref 26.0–34.0)
MCHC: 29.4 g/dL — ABNORMAL LOW (ref 30.0–36.0)
MCV: 93.1 fL (ref 80.0–100.0)
Platelets: 76 10*3/uL — ABNORMAL LOW (ref 150–400)
RBC: 3.5 MIL/uL — ABNORMAL LOW (ref 4.22–5.81)
RDW: 19.7 % — ABNORMAL HIGH (ref 11.5–15.5)
WBC: 11.4 10*3/uL — ABNORMAL HIGH (ref 4.0–10.5)
nRBC: 2.2 % — ABNORMAL HIGH (ref 0.0–0.2)

## 2023-08-13 LAB — CULTURE, BLOOD (ROUTINE X 2)
Culture: NO GROWTH
Culture: NO GROWTH
Special Requests: ADEQUATE

## 2023-08-13 LAB — GLUCOSE, CAPILLARY
Glucose-Capillary: 117 mg/dL — ABNORMAL HIGH (ref 70–99)
Glucose-Capillary: 204 mg/dL — ABNORMAL HIGH (ref 70–99)
Glucose-Capillary: 222 mg/dL — ABNORMAL HIGH (ref 70–99)
Glucose-Capillary: 235 mg/dL — ABNORMAL HIGH (ref 70–99)
Glucose-Capillary: 237 mg/dL — ABNORMAL HIGH (ref 70–99)
Glucose-Capillary: 57 mg/dL — ABNORMAL LOW (ref 70–99)
Glucose-Capillary: 60 mg/dL — ABNORMAL LOW (ref 70–99)
Glucose-Capillary: 64 mg/dL — ABNORMAL LOW (ref 70–99)
Glucose-Capillary: 68 mg/dL — ABNORMAL LOW (ref 70–99)
Glucose-Capillary: 81 mg/dL (ref 70–99)
Glucose-Capillary: >600 mg/dL (ref 70–99)

## 2023-08-13 LAB — COMPREHENSIVE METABOLIC PANEL
ALT: 102 U/L — ABNORMAL HIGH (ref 0–44)
AST: 122 U/L — ABNORMAL HIGH (ref 15–41)
Albumin: 2 g/dL — ABNORMAL LOW (ref 3.5–5.0)
Alkaline Phosphatase: 71 U/L (ref 38–126)
Anion gap: 14 (ref 5–15)
BUN: 40 mg/dL — ABNORMAL HIGH (ref 8–23)
CO2: 30 mmol/L (ref 22–32)
Calcium: 7.9 mg/dL — ABNORMAL LOW (ref 8.9–10.3)
Chloride: 104 mmol/L (ref 98–111)
Creatinine, Ser: 1.38 mg/dL — ABNORMAL HIGH (ref 0.61–1.24)
GFR, Estimated: 54 mL/min — ABNORMAL LOW (ref >60)
Glucose, Bld: 132 mg/dL — ABNORMAL HIGH (ref 70–99)
Potassium: 3.2 mmol/L — ABNORMAL LOW (ref 3.5–5.1)
Sodium: 148 mmol/L — ABNORMAL HIGH (ref 135–145)
Total Bilirubin: 1.1 mg/dL (ref 0.0–1.2)
Total Protein: 5.5 g/dL — ABNORMAL LOW (ref 6.5–8.1)

## 2023-08-13 LAB — HEPARIN LEVEL (UNFRACTIONATED): Heparin Unfractionated: 0.47 [IU]/mL (ref 0.30–0.70)

## 2023-08-13 LAB — VALPROIC ACID LEVEL: Valproic Acid Lvl: 58 ug/mL (ref 50.0–100.0)

## 2023-08-13 MED ORDER — HEPARIN SODIUM (PORCINE) 5000 UNIT/ML IJ SOLN
5000.0000 [IU] | Freq: Three times a day (TID) | INTRAMUSCULAR | Status: DC
  Administered 2023-08-13 – 2023-08-14 (×3): 5000 [IU] via SUBCUTANEOUS
  Filled 2023-08-13 (×3): qty 1

## 2023-08-13 MED ORDER — IOHEXOL 350 MG/ML SOLN
80.0000 mL | Freq: Once | INTRAVENOUS | Status: AC | PRN
  Administered 2023-08-13: 80 mL via INTRAVENOUS

## 2023-08-13 MED ORDER — POTASSIUM CHLORIDE 20 MEQ PO PACK
40.0000 meq | PACK | Freq: Once | ORAL | Status: AC
  Administered 2023-08-13: 40 meq
  Filled 2023-08-13 (×2): qty 2

## 2023-08-13 MED ORDER — EPINEPHRINE 1 MG/10ML IJ SOSY
PREFILLED_SYRINGE | INTRAMUSCULAR | Status: AC
  Filled 2023-08-13: qty 10

## 2023-08-13 MED ORDER — DEXTROSE 50 % IV SOLN
12.5000 g | INTRAVENOUS | Status: AC
  Administered 2023-08-13: 12.5 g via INTRAVENOUS
  Filled 2023-08-13: qty 50

## 2023-08-13 NOTE — Progress Notes (Signed)
eLink Physician-Brief Progress Note Patient Name: Todd [name] DOB: [birth year] MRN: [id number]   Date of Service  08/13/2023  HPI/Events of Note  CT angio right upper ext nondiagnostic.  Nurse reports she can palpate radial pulse.   Exam of ext has not worsened.  eICU Interventions  Continue heparin     Intervention Category Intermediate Interventions: Other:  Henry Russel, P 08/13/2023, 3:59 AM

## 2023-08-13 NOTE — Progress Notes (Signed)
Patient transported from 2M11 to MRI and back to 2M11 with no complications noted.

## 2023-08-13 NOTE — Progress Notes (Signed)
NAME:  Todd [name], MRN:  [id number], DOB:  07/05/1950, LOS: 5 ADMISSION DATE:  08/08/2023, CONSULTATION DATE:  08/08/2023 REFERRING MD:  Dr. Rhunette Croft, CHIEF COMPLAINT:  cardiac arrest   History of Present Illness:  Pt encephalopathic, therefore HPI obtained from EMR.    20 yoM with PMH of tobacco abuse, HTN, atrial fibrillation (not on AC given prior GIBs), pulmonary hypertension, HLD, ETOH abuse w/hx of withdrawal seizures, PAD, hypothyroidism, GERD, chronic pancreatitis, pancreatic duct dilation/ IPMN, and suspected cirrhosis presenting to [facility] ER due to altered mental status.  Family reports ongoing exertional SOB since recent discharge.  Recent hospitalization for 1/30- 2/5 left foot pain with ulcers, AKI, and SOB with anasarca treated with IV diureses;  underwent balloon angioplasty with left SFA stent placement with vascular surgery on 2/3.  Wife reported concerns of AMS, SBP 80's, with shaking but pt had returned to his baseline by EMS arrival and refused transport.  An hour later, pt was in respiratory distress and hypoxic.  By arrival to ER by EMS, HR had dropped to 20's and become pulseless.  ACLS measures provided including CPR, intubation, with ROSC after 15 mins.    ER workup significant for plts 82, AG 18, iCa 0.8, AST/ ALT 271/ 95, BNP 1697 (1769 on 1/30), trop 86, lactic > 9, INR 1.5, CXR showing extensive interstitial opacities favoring pulmonary edema with right small to moderate pleural effusion.  SARS 2 positive.  ABG 7.08/ 66/ 97/ 19.7.  Requiring vasopressor support.  CVL placed.  To be transferred to [facility] for higher level of care, PCCM accepted.   Pertinent  Medical History  Tobacco abuse, HTN, atrial fibrillation (not on AC given prior GIBs), pulmonary hypertension, HLD, ETOH abuse w/hx of withdrawal seizures, PAD, hypothyroidism, GERD, chronic pancreatitis, pancreatic duct dilation/ IPMN, suspected cirrhosis  Significant Hospital Events: Including procedures,  antibiotic start and stop dates in addition to other pertinent events   2/11 admit post arrest, intubated, transfer to [facility] ICU 2/13 seizure activity, versed bolus and valproate x1, bloody BM 2/15 arterial line malfunction - concern for radial artery clot. Heparin started. CTA right arm done - nondiagnostic.  Interim History / Subjective:   cEEG shows diffuse encephalopathy CTA right arm non-diagnostic, pulse present this morning Family at bedside  Objective   Blood pressure 135/79, pulse (!) 107, temperature 99.7 F (37.6 C), resp. rate (!) 21, height 5' 7.99" (1.727 m), weight 44.6 kg, SpO2 100%. CVP:  [3 mmHg-5 mmHg] 3 mmHg  Vent Mode: PRVC FiO2 (%):  [50 %] 50 % Set Rate:  [24 bmp] 24 bmp Vt Set:  [550 mL] 550 mL PEEP:  [10 cmH20] 10 cmH20 Plateau Pressure:  [26 cmH20-27 cmH20] 26 cmH20   Intake/Output Summary (Last 24 hours) at 08/13/2023 0947 Last data filed at 08/13/2023 0700 Gross per 24 hour  Intake 1466.24 ml  Output 1485 ml  Net -18.76 ml   Filed Weights   08/09/23 0600 08/12/23 0500 08/13/23 0500  Weight: 44.1 kg 42.5 kg 44.6 kg    Examination: General: chronically ill male, no distress HENT: ETT in place Lungs: diminished breath sounds, no wheezing Cardiovascular: irregular rhythm, normal rate Abdomen: bowel sounds present, soft, non-distended Extremities: wounds of b/l LE with dressing in place, no LE edema  Neuro: opens eyes slightly intermittently, no purposeful movement, slight cough reflex with suction, corneal intact GU: foley in place  Resolved Hospital Problem list    IV infiltration RUE of vasopressors  Assessment & Plan:  Acute hypoxic  and hypercarbic respiratory failure Pulmonary Hypertension SARS 2 positive vs pulmonary edema vs r/o CAP Pseudomonas pneumonia Tobacco abuse - full MV support, 4-8cc/kg IBW with goal Pplat <30 and DP<15  - VAP prevention protocol/ PPI - PAD protocol for sedation> versed, prn fentanyl.  RASS goal 0/-1 - adjust  peep/ FiO2 as able for SpO2 >90%  - bronchodilators > brovana, pulmicort, yupleri and prn albuterol  - solumedrol 40mg  daily x 5 days - zosyn x 7 days    Seizures vs Myoclonic activity Acute metabolic encephalopathy Hx of ETOH abuse- wife reports pt has not been drinking recently  CTH negative for acute intracranial process. EEG showed myoclonic sz every 10-30 seconds early 2/12.  - maintain neuro protective measures; goal for eurothermia, euglycemia, eunatermia, normoxia, and PCO2 goal of 35-40 - neurology following, appreciate assistance   - Keppra 750 BID  - Valproate 500mg  TID - Versed PRN - Klonopin 1 mg TID - Repeat MRI brain, plan for today - Seizure precautions   - empiric thiamine   Cardiac arrest- suspect respiratory etiology. With reports of shaking, r/o seizure 2/2 hypoxia, hypoperfusion vs ?ETOH withdrawal seizure - cont tele - does not appear to be ischemic     BRBPR, resolved Internal hemorrhoids Noted bloody BM 2/13 overnight. CBC remains stable. 2024 colonoscopy with internal hemorrhoids and removed polyps tubular adenoma. EGD then showed non-bleeding gastric ulcer. No further episodes of bloody BM, last charted brown.  - Trend CBC daily  - On PPI  Afib, chronic - rate controlled  - not on AC due to prior GIBs, prior cardioversion's failed - optimize electrolytes    AKI  Normal renal function in December.  - trend BMP, Cr improved today - cont foley  - strict I/Os, daily wts - avoid nephrotoxins, renal dose meds, hemodynamic support as above   Transaminitis Anasarca  Cirrhosis  AST/ALT significantly elevated. Hx pancreatic duct dilation/ IPMN, chronic pancreatitis. Suspect shock liver +/- hepatic congestion, prior concern for cirrhosis on RUQ imaging. lipase normal. Ongoing LE edema.  - trend CMP  - LFTs improving  - Lactulose 20 g TID  Chronic normocytic anemia Thrombocytopenia - H/H at baseline.  Recent plt 160. Fluctuating at 88 today.  Could be  related to liver disease.  PTA ASA/ plavix which are held. - trend CBC, transfuse for Hgb < 7 - hold DAPT    GERD Hx gastric ulcer - PPI    Hypothyroidism  TSH 0.967 on 07/04/23. Not taking synthroid per fill records.    Severe PAD prior recent left SFA balloon angioplasty with stent - Holding home ASA/ plavix    Sacral pressure wound, POA BLE foot wounds Protein-calorie malnutrition - hx of ongoing wt loss, suspect his anasarca is masking weight loss - WOC consult  - RD consult      Poor mobility/ deconditioning  - reports limited mobility for months secondary to PAD, suspect PH, and other factors also contributing  - PT/ OT when appropriate    GOC - wife and children present on admission.  Remains full code for now, aware of MODS and high risk for further deterioration.  Has living will, would not want trach or PEG. Monitor him to see if significant improvement, otherwise continue GOC and consider PMT.   Best Practice (right click and "Reselect all SmartList Selections" daily)   Diet/type: tubefeeds DVT prophylaxis prophylactic heparin   Pressure ulcer(s): present on admission  GI prophylaxis: PPI Lines: Central line and Arterial Line Foley:  Yes, and  it is still needed Code Status:  full code Last date of multidisciplinary goals of care discussion [updated family 2/16]  Labs   CBC: Recent Labs  Lab 08/10/23 0420 08/10/23 1251 08/11/23 0407 08/12/23 0437 08/13/23 0509  WBC 11.5* 13.0* 14.3* 14.1* 11.4*  HGB 11.5* 11.4* 10.9* 10.2* 9.6*  HCT 36.0* 35.9* 35.6* 33.7* 32.6*  MCV 87.6 87.3 89.0 91.1 93.1  PLT 129* 111* 88* 70* 76*    Basic Metabolic Panel: Recent Labs  Lab 08/08/23 1220 08/08/23 1742 08/09/23 0603 08/09/23 1108 08/09/23 1700 08/09/23 2039 08/10/23 0420 08/11/23 0407 08/12/23 0437 08/13/23 0509  NA 136  135   < > 136 138  --   --  138 140 144 148*  K 4.4  4.4   < > 3.9 3.9  --   --  3.7 3.7 3.8 3.2*  CL 96*  102  --  100  --   --    --  101 103 103 104  CO2 22  --  22  --   --   --  23 24 26 30   GLUCOSE 141*  130*  --  153*  --   --   --  219* 227* 188* 132*  BUN 24*  24*  --  27*  --   --   --  34* 42* 44* 40*  CREATININE 1.50*  1.60*  --  1.68*  --   --   --  1.66* 1.60* 1.66* 1.38*  CALCIUM 7.6*  --  7.7*  --   --   --  7.7* 7.5* 7.9* 7.9*  MG 2.2  --  1.8  --  2.5*  --  2.2 2.2 2.1  --   PHOS 4.6  --   --   --  3.0 3.1 2.4* 2.6  --   --    < > = values in this interval not displayed.   GFR: Estimated Creatinine Clearance: 30.1 mL/min (A) (by C-G formula based on SCr of 1.38 mg/dL (H)). Recent Labs  Lab 08/08/23 1220 08/08/23 1431 08/08/23 1749 08/08/23 2209 08/09/23 0603 08/10/23 0420 08/10/23 1251 08/11/23 0407 08/12/23 0437 08/13/23 0509  PROCALCITON <0.10  --   --   --  2.04  --   --   --   --   --   WBC 4.9  --   --   --  8.9   < > 13.0* 14.3* 14.1* 11.4*  LATICACIDVEN >9.0* 8.4* 8.3* 3.5*  --   --   --   --   --   --    < > = values in this interval not displayed.    Liver Function Tests: Recent Labs  Lab 08/09/23 0603 08/10/23 0420 08/11/23 0407 08/12/23 0437 08/13/23 0509  AST 926* 564* 323* 204* 122*  ALT 258* 234* 200* 148* 102*  ALKPHOS 59 67 73 78 71  BILITOT 1.4* 1.3* 1.2 1.0 1.1  PROT 5.7* 5.1* 5.2* 5.3* 5.5*  ALBUMIN 2.4* 2.1* 2.1* 2.2* 2.0*   Recent Labs  Lab 08/09/23 0603  LIPASE 18   Recent Labs  Lab 08/08/23 2209 08/12/23 0436  AMMONIA 40* 29    ABG    Component Value Date/Time   PHART 7.577 (H) 08/09/2023 1108   PCO2ART 27.5 (L) 08/09/2023 1108   PO2ART 61 (L) 08/09/2023 1108   HCO3 25.6 08/09/2023 1108   TCO2 26 08/09/2023 1108   ACIDBASEDEF 2.0 08/08/2023 1742   O2SAT 95 08/09/2023 1108  Coagulation Profile: Recent Labs  Lab 08/08/23 1220 08/09/23 2039  INR 1.5* 1.5*    Cardiac Enzymes: No results for input(s): "CKTOTAL", "CKMB", "CKMBINDEX", "TROPONINI" in the last 168 hours.  HbA1C: Hgb A1c MFr Bld  Date/Time Value Ref Range Status   08/09/2023 10:54 AM 6.7 (H) 4.8 - 5.6 % Final    Comment:    (NOTE) Pre diabetes:          5.7%-6.4%  Diabetes:              >6.4%  Glycemic control for   <7.0% adults with diabetes     CBG: Recent Labs  Lab 08/12/23 2314 08/13/23 0335 08/13/23 0337 08/13/23 0723 08/13/23 0736  GLUCAP 87 64* 81 60* 117*      Critical care time: 35 minutes    Melody Comas, MD [facility] Office: [phone]   See Amion for personal pager PCCM on call pager [phone] until 7pm. Please call Elink 7p-7a. [phone]

## 2023-08-13 NOTE — Progress Notes (Signed)
Subjective: No acute events overnight. MRI Brain negative for any acute abnormalities.  ROS: Unable to obtain due to poor mental status  Examination  Vital signs in last 24 hours: Temp:  [86 F (30 C)-100.2 F (37.9 C)] 100.2 F (37.9 C) (02/17 0530) Pulse Rate:  [99-128] 116 (02/17 0400) Resp:  [0-33] 28 (02/17 0530) BP: (114-145)/(65-87) 133/70 (02/17 0500) SpO2:  [79 %-100 %] 95 % (02/17 0400) FiO2 (%):  [50 %] 50 % (02/17 0400)  General: lying in bed, intubated Extremities: Bluish discoloration of  all extremities. Neuro exam completely off sedation. To loud clap, he has some eye blinking but does not open eyes. To tactile stimulation, no eye opening. Does not make eye contact. does not follow commands, pupils equally round reactive to light, no forced gaze deviation, corneal reflex intact, cough reflex present, gag absent. To nares stimulation, he lifts his head up off the bed and has a bout of cough. does not withdraw to noxious stimuli in BL UE, or in BL LE  Basic Metabolic Panel: Recent Labs  Lab 08/08/23 1220 08/08/23 1742 08/09/23 0603 08/09/23 1108 08/09/23 1700 08/09/23 2039 08/10/23 0420 08/11/23 0407 08/12/23 0437 08/13/23 0509 08/14/23 0356  NA 136  135   < > 136   < >  --   --  138 140 144 148* 151*  K 4.4  4.4   < > 3.9   < >  --   --  3.7 3.7 3.8 3.2* 3.4*  CL 96*  102  --  100  --   --   --  101 103 103 104 108  CO2 22  --  22  --   --   --  23 24 26 30 29   GLUCOSE 141*  130*  --  153*  --   --   --  219* 227* 188* 132* 164*  BUN 24*  24*  --  27*  --   --   --  34* 42* 44* 40* 39*  CREATININE 1.50*  1.60*  --  1.68*  --   --   --  1.66* 1.60* 1.66* 1.38* 1.44*  CALCIUM 7.6*  --  7.7*  --   --   --  7.7* 7.5* 7.9* 7.9* 7.9*  MG 2.2  --  1.8  --  2.5*  --  2.2 2.2 2.1  --   --   PHOS 4.6  --   --   --  3.0 3.1 2.4* 2.6  --   --   --    < > = values in this interval not displayed.    CBC: Recent Labs  Lab 08/10/23 1251 08/11/23 0407  08/12/23 0437 08/13/23 0509 08/14/23 0356  WBC 13.0* 14.3* 14.1* 11.4* 10.4  HGB 11.4* 10.9* 10.2* 9.6* 9.5*  HCT 35.9* 35.6* 33.7* 32.6* 32.3*  MCV 87.3 89.0 91.1 93.1 95.0  PLT 111* 88* 70* 76* 85*     Coagulation Studies: No results for input(s): "LABPROT", "INR" in the last 72 hours.   Imaging MRI Brain with no acute abnormalities. Personally reviewed.  ASSESSMENT AND PLAN: 73 year old male admitted to ICU after PEA arrest with high-quality CPR with ROSC in 15 minutes.   Cardiac arrest Transaminitis -Patient has had myoclonic seizures, worse with stimulation   Recommendations -Continue Valproic acid at 250mg  Q8H given decline in platelet count -Continue Klonopin 1 mg 3 times daily, Keppra 750 mg twice daily (maximal dose for current renal function) - MRI Brain with  no acute abnormalities. - has been off sedation with with very little evidence of higher cerebral function. He has slight frown today to loud clap that does seem a bit purposeful. His brainstem reflexes are intact except for gag. Prognosis is indeterminate. However, I do think that given his frailty, chronic conditions and cachexia, he is likely to require prolonged stay in a SNF and likely to require a trach and a Peg tube for atleast a little bit. -Discussed plan in detail with patient's wife, son and daughter at bedside - family needs some time to process.    Erick Blinks [facility]

## 2023-08-13 NOTE — Progress Notes (Signed)
eLink Physician-Brief Progress Note Patient Name: Todd [name] DOB: 13-Feb-1951 MRN: [id number]   Date of Service  08/13/2023  HPI/Events of Note  Patient getting conflicting CBG results.  Found patient cyanotic fingers, it was 57.  She gave her half amp of dextrose and check for peripheral IV site and it was greater than 600.  She rechecked it through the CVC site and it was 68.  eICU Interventions  Recommended patient check it from ear lobe.  It was 236.  Will check blood glucose from ear lobe for the rest of the night.     Intervention Category Minor Interventions: Other:  Carilyn Goodpasture 08/13/2023, 9:27 PM

## 2023-08-13 NOTE — Procedures (Signed)
Patient Name: Todd [name]  MRN: [id number]  Epilepsy Attending: Charlsie Quest  Referring Physician/Provider: Erick Blinks, MD  Duration: 08/12/2023 0927 to 08/13/2023 1058   Patient history: 73 year old male status post cardiac arrest.  EEG to evaluate for seizure.   Level of alertness: comatose   AEDs during EEG study: LEV, Clonazepam, VPA   Technical aspects: This EEG study was done with scalp electrodes positioned according to the 10-20 International system of electrode placement. Electrical activity was reviewed with band pass filter of 1-70Hz , sensitivity of 7 uV/mm, display speed of 74mm/sec with a 60Hz  notched filter applied as appropriate. EEG data were recorded continuously and digitally stored.  Video monitoring was not available due to technical issues   Description: EEG showed near continuous generalized 3 to 5 Hz theta-delta slowing admixed with 1 to 3 seconds of generalized EEG attenuation.   EEG was disconnected between 08/12/2023 0036 to 0134.    ABNORMALITY -Periodic discharges, generalized -Continuous slow, generalized   IMPRESSION: This study is suggestive of moderate to severe diffuse encephalopathy. No definite seizures were noted.    Corda Shutt Annabelle Harman

## 2023-08-13 NOTE — Progress Notes (Signed)
 LTM EEG disconnected - no skin breakdown at [facility].

## 2023-08-14 DIAGNOSIS — R569 Unspecified convulsions: Secondary | ICD-10-CM | POA: Diagnosis not present

## 2023-08-14 DIAGNOSIS — I469 Cardiac arrest, cause unspecified: Secondary | ICD-10-CM | POA: Diagnosis not present

## 2023-08-14 DIAGNOSIS — I4891 Unspecified atrial fibrillation: Secondary | ICD-10-CM

## 2023-08-14 LAB — CBC
HCT: 32.3 % — ABNORMAL LOW (ref 39.0–52.0)
Hemoglobin: 9.5 g/dL — ABNORMAL LOW (ref 13.0–17.0)
MCH: 27.9 pg (ref 26.0–34.0)
MCHC: 29.4 g/dL — ABNORMAL LOW (ref 30.0–36.0)
MCV: 95 fL (ref 80.0–100.0)
Platelets: 85 10*3/uL — ABNORMAL LOW (ref 150–400)
RBC: 3.4 MIL/uL — ABNORMAL LOW (ref 4.22–5.81)
RDW: 20.1 % — ABNORMAL HIGH (ref 11.5–15.5)
WBC: 10.4 10*3/uL (ref 4.0–10.5)
nRBC: 1.6 % — ABNORMAL HIGH (ref 0.0–0.2)

## 2023-08-14 LAB — GLUCOSE, CAPILLARY
Glucose-Capillary: 143 mg/dL — ABNORMAL HIGH (ref 70–99)
Glucose-Capillary: 159 mg/dL — ABNORMAL HIGH (ref 70–99)
Glucose-Capillary: 181 mg/dL — ABNORMAL HIGH (ref 70–99)
Glucose-Capillary: 239 mg/dL — ABNORMAL HIGH (ref 70–99)
Glucose-Capillary: 389 mg/dL — ABNORMAL HIGH (ref 70–99)
Glucose-Capillary: 72 mg/dL (ref 70–99)

## 2023-08-14 LAB — COMPREHENSIVE METABOLIC PANEL
ALT: 80 U/L — ABNORMAL HIGH (ref 0–44)
AST: 83 U/L — ABNORMAL HIGH (ref 15–41)
Albumin: 2 g/dL — ABNORMAL LOW (ref 3.5–5.0)
Alkaline Phosphatase: 73 U/L (ref 38–126)
Anion gap: 14 (ref 5–15)
BUN: 39 mg/dL — ABNORMAL HIGH (ref 8–23)
CO2: 29 mmol/L (ref 22–32)
Calcium: 7.9 mg/dL — ABNORMAL LOW (ref 8.9–10.3)
Chloride: 108 mmol/L (ref 98–111)
Creatinine, Ser: 1.44 mg/dL — ABNORMAL HIGH (ref 0.61–1.24)
GFR, Estimated: 51 mL/min — ABNORMAL LOW (ref >60)
Glucose, Bld: 164 mg/dL — ABNORMAL HIGH (ref 70–99)
Potassium: 3.4 mmol/L — ABNORMAL LOW (ref 3.5–5.1)
Sodium: 151 mmol/L — ABNORMAL HIGH (ref 135–145)
Total Bilirubin: 0.9 mg/dL (ref 0.0–1.2)
Total Protein: 6.8 g/dL (ref 6.5–8.1)

## 2023-08-14 LAB — MAGNESIUM: Magnesium: 2.4 mg/dL (ref 1.7–2.4)

## 2023-08-14 MED ORDER — LACTULOSE 10 GM/15ML PO SOLN
20.0000 g | Freq: Two times a day (BID) | ORAL | Status: DC
  Administered 2023-08-14: 20 g
  Filled 2023-08-14 (×2): qty 30

## 2023-08-14 MED ORDER — AMIODARONE LOAD VIA INFUSION
150.0000 mg | Freq: Once | INTRAVENOUS | Status: AC
  Administered 2023-08-14: 150 mg via INTRAVENOUS
  Filled 2023-08-14: qty 83.34

## 2023-08-14 MED ORDER — AMIODARONE IV BOLUS ONLY 150 MG/100ML
150.0000 mg | Freq: Once | INTRAVENOUS | Status: AC
  Administered 2023-08-14: 150 mg via INTRAVENOUS
  Filled 2023-08-14: qty 100

## 2023-08-14 MED ORDER — VALPROATE SODIUM 100 MG/ML IV SOLN
250.0000 mg | Freq: Two times a day (BID) | INTRAVENOUS | Status: DC
  Administered 2023-08-14 – 2023-08-15 (×2): 250 mg via INTRAVENOUS
  Filled 2023-08-14: qty 250
  Filled 2023-08-14 (×3): qty 2.5

## 2023-08-14 MED ORDER — ASPIRIN 81 MG PO CHEW
81.0000 mg | CHEWABLE_TABLET | Freq: Every day | ORAL | Status: DC
  Administered 2023-08-14 – 2023-08-15 (×2): 81 mg
  Filled 2023-08-14 (×2): qty 1

## 2023-08-14 MED ORDER — CLONAZEPAM 0.5 MG PO TABS
0.5000 mg | ORAL_TABLET | Freq: Three times a day (TID) | ORAL | Status: DC
  Administered 2023-08-14 – 2023-08-15 (×3): 0.5 mg
  Filled 2023-08-14 (×3): qty 1

## 2023-08-14 MED ORDER — LEVETIRACETAM IN NACL 500 MG/100ML IV SOLN
500.0000 mg | Freq: Two times a day (BID) | INTRAVENOUS | Status: DC
  Administered 2023-08-14 – 2023-08-15 (×2): 500 mg via INTRAVENOUS
  Filled 2023-08-14 (×2): qty 100

## 2023-08-14 MED ORDER — POTASSIUM CHLORIDE 20 MEQ PO PACK
40.0000 meq | PACK | Freq: Once | ORAL | Status: AC
  Administered 2023-08-14: 40 meq
  Filled 2023-08-14: qty 2

## 2023-08-14 MED ORDER — HEPARIN SODIUM (PORCINE) 5000 UNIT/ML IJ SOLN
5000.0000 [IU] | Freq: Two times a day (BID) | INTRAMUSCULAR | Status: DC
  Administered 2023-08-14 – 2023-08-15 (×2): 5000 [IU] via SUBCUTANEOUS
  Filled 2023-08-14 (×2): qty 1

## 2023-08-14 MED ORDER — AMIODARONE HCL IN DEXTROSE 360-4.14 MG/200ML-% IV SOLN
60.0000 mg/h | INTRAVENOUS | Status: AC
Start: 2023-08-14 — End: 2023-08-14
  Administered 2023-08-14: 60 mg/h via INTRAVENOUS
  Filled 2023-08-14 (×2): qty 200

## 2023-08-14 MED ORDER — FUROSEMIDE 10 MG/ML IJ SOLN
40.0000 mg | Freq: Every day | INTRAMUSCULAR | Status: DC
  Administered 2023-08-14 – 2023-08-15 (×2): 40 mg via INTRAVENOUS
  Filled 2023-08-14 (×2): qty 4

## 2023-08-14 MED ORDER — AMANTADINE HCL 100 MG PO CAPS
100.0000 mg | ORAL_CAPSULE | Freq: Two times a day (BID) | ORAL | Status: DC
  Administered 2023-08-14 – 2023-08-15 (×3): 100 mg
  Filled 2023-08-14 (×4): qty 1

## 2023-08-14 MED ORDER — AMIODARONE HCL IN DEXTROSE 360-4.14 MG/200ML-% IV SOLN
30.0000 mg/h | INTRAVENOUS | Status: DC
  Administered 2023-08-14 – 2023-08-15 (×3): 30 mg/h via INTRAVENOUS
  Filled 2023-08-14: qty 200

## 2023-08-14 MED ORDER — ACETAMINOPHEN 325 MG PO TABS
650.0000 mg | ORAL_TABLET | Freq: Four times a day (QID) | ORAL | Status: DC | PRN
  Administered 2023-08-14 (×2): 650 mg
  Filled 2023-08-14 (×2): qty 2

## 2023-08-14 NOTE — Progress Notes (Signed)
[facility] ADULT ICU REPLACEMENT PROTOCOL   The patient does apply for the [facility] Adult ICU Electrolyte Replacment Protocol based on the criteria listed below:   1.Exclusion criteria: TCTS, ECMO, Dialysis, and Myasthenia Gravis patients 2. Is GFR >/= 30 ml/min? Yes.    Patient's GFR today is 51 3. Is SCr </= 2? Yes.   Patient's SCr is 1.44 mg/dL 4. Did SCr increase >/= 0.5 in 24 hours? No. 5.Pt's weight >40kg  Yes.   6. Abnormal electrolyte(s): K  7. Electrolytes replaced per protocol 8.  Call MD STAT for K+ </= 2.5, Phos </= 1, or Mag </= 1 Physician:  Merryl Hacker [facility] 08/14/2023 5:12 AM

## 2023-08-14 NOTE — Progress Notes (Signed)
Pts family made aware that pt is covid positive and per protocol are required to wear mask. Family members continue to not wear face mask as requested by this RN and CRN/ director informed.

## 2023-08-14 NOTE — Progress Notes (Addendum)
eLink Physician-Brief Progress Note Patient Name: Todd [name] DOB: Aug 15, 1950 MRN: [id number]   Date of Service  08/14/2023  HPI/Events of Note  Patient's daughter is at bedside and wants to know why furosemide was discontinued even though his father has bilateral lower extremity edema.  eICU Interventions  Chart reviewed.  I cannot find a rationale for discontinuing Lasix, however, daytime intensivist will soon be the and be able to make a call on whether or not to resume Lasix. Attempted to explain this to patient's daughter with minimal success.  I will still defer to a.m. team with regards to reinstatement of furosemide. Bedside RN present for interaction.     Intervention Category Evaluation Type: Other  Misaki Sozio 08/14/2023, 2:35 AM ---------------------------------------------- Pt in afib. Amiodarone bolus ordered

## 2023-08-14 NOTE — Progress Notes (Signed)
NAME:  Todd [name], MRN:  [id number], DOB:  11/17/1950, LOS: 6 ADMISSION DATE:  08/08/2023, CONSULTATION DATE:  08/08/2023 REFERRING MD:  Dr. Rhunette Croft, CHIEF COMPLAINT:  cardiac arrest   History of Present Illness:  Pt encephalopathic, therefore HPI obtained from EMR.    10 yoM with PMH of tobacco abuse, HTN, atrial fibrillation (not on AC given prior GIBs), pulmonary hypertension, HLD, ETOH abuse w/hx of withdrawal seizures, PAD, hypothyroidism, GERD, chronic pancreatitis, pancreatic duct dilation/ IPMN, and suspected cirrhosis presenting to [facility] ER due to altered mental status.  Family reports ongoing exertional SOB since recent discharge.  Recent hospitalization for 1/30- 2/5 left foot pain with ulcers, AKI, and SOB with anasarca treated with IV diureses;  underwent balloon angioplasty with left SFA stent placement with vascular surgery on 2/3.  Wife reported concerns of AMS, SBP 80's, with shaking but pt had returned to his baseline by EMS arrival and refused transport.  An hour later, pt was in respiratory distress and hypoxic.  By arrival to ER by EMS, HR had dropped to 20's and become pulseless.  ACLS measures provided including CPR, intubation, with ROSC after 15 mins.    ER workup significant for plts 82, AG 18, iCa 0.8, AST/ ALT 271/ 95, BNP 1697 (1769 on 1/30), trop 86, lactic > 9, INR 1.5, CXR showing extensive interstitial opacities favoring pulmonary edema with right small to moderate pleural effusion.  SARS 2 positive.  ABG 7.08/ 66/ 97/ 19.7.  Requiring vasopressor support.  CVL placed.  To be transferred to [facility] for higher level of care, PCCM accepted.   Pertinent  Medical History  Tobacco abuse, HTN, atrial fibrillation (not on AC given prior GIBs), pulmonary hypertension, HLD, ETOH abuse w/hx of withdrawal seizures, PAD, hypothyroidism, GERD, chronic pancreatitis, pancreatic duct dilation/ IPMN, suspected cirrhosis  Significant Hospital Events: Including procedures,  antibiotic start and stop dates in addition to other pertinent events   2/11 admit post arrest, intubated, transfer to [facility] ICU 2/13 seizure activity, versed bolus and valproate x1, bloody BM 2/15 arterial line malfunction - concern for radial artery clot. Heparin started. CTA right arm done - nondiagnostic. 2/16 brain MRI without acute findings  Interim History / Subjective:  Poorly responsive still.    Objective   Blood pressure 133/70, pulse (!) 116, temperature 100.2 F (37.9 C), resp. rate (!) 28, height 5' 7.99" (1.727 m), weight 44.6 kg, SpO2 95%. CVP:  [4 mmHg] 4 mmHg  Vent Mode: PRVC FiO2 (%):  [50 %] 50 % Set Rate:  [24 bmp] 24 bmp Vt Set:  [550 mL] 550 mL PEEP:  [10 cmH20] 10 cmH20 Plateau Pressure:  [26 cmH20-27 cmH20] 27 cmH20   Intake/Output Summary (Last 24 hours) at 08/14/2023 4098 Last data filed at 08/14/2023 0500 Gross per 24 hour  Intake 1519.78 ml  Output 1245 ml  Net 274.78 ml   Filed Weights   08/09/23 0600 08/12/23 0500 08/13/23 0500  Weight: 44.1 kg 42.5 kg 44.6 kg    Examination: General: chronically ill male, no distress HENT: ETT in place Lungs: diminished breath sounds, no wheezing Cardiovascular: irregular rhythm, mild tachycardia Abdomen: bowel sounds present, soft, non-distended Extremities: wounds of b/l LE with dressing in place, no LE edema  Neuro: opens eyes slightly intermittently, no purposeful movement GU: foley in place  Resolved Hospital Problem list    IV infiltration RUE of vasopressors  Assessment & Plan:  Acute hypoxic and hypercarbic respiratory failure Pulmonary Hypertension SARS 2 positive vs pulmonary edema vs  r/o CAP Pseudomonas pneumonia Tobacco abuse - full MV support, 4-8cc/kg IBW with goal Pplat <30 and DP<15  - VAP prevention protocol/ PPI - PAD protocol for sedation> versed, prn fentanyl.  RASS goal 0/-1 - adjust peep/ FiO2 as able for SpO2 >90%  - bronchodilators > brovana, pulmicort, yupleri and prn  albuterol  - completed solumedrol 40mg  daily x 5 days - zosyn x 7 days    Seizures vs Myoclonic activity Acute metabolic encephalopathy Hx of ETOH abuse- wife reports pt has not been drinking recently  CTH negative for acute intracranial process. EEG showed myoclonic sz every 10-30 seconds early 2/12.  - maintain neuro protective measures; goal for eurothermia, euglycemia, eunatermia, normoxia, and PCO2 goal of 35-40 - neurology following, appreciate assistance   - Keppra 750 BID  - Valproate 250 mg TID - Klonopin 1 mg TID - MRI brain without acute findings - Seizure precautions   - empiric thiamine   Cardiac arrest- suspect respiratory etiology. With reports of shaking, r/o seizure 2/2 hypoxia, hypoperfusion vs ?ETOH withdrawal seizure - cont tele - does not appear to be ischemic    BRBPR, resolved Internal hemorrhoids Noted bloody BM 2/13 overnight. CBC remains stable. 2024 colonoscopy with internal hemorrhoids and removed polyps tubular adenoma. EGD then showed non-bleeding gastric ulcer. No further episodes of bloody BM, last charted brown.  - Trend CBC daily  - On PPI  Afib w RVR, chronic - got amio bolus this AM, continue loading amio  - not on AC due to prior GIBs, acute on chronic anemia now, prior cardioversion's failed - optimize electrolytes    AKI  Normal renal function in December.  - trend BMP, Cr improved today - cont foley  - strict I/Os, daily wts - avoid nephrotoxins, renal dose meds, hemodynamic support as above - IV lasix held   Transaminitis Anasarca  Cirrhosis  AST/ALT significantly elevated. Hx pancreatic duct dilation/ IPMN, chronic pancreatitis. Suspect shock liver +/- hepatic congestion, prior concern for cirrhosis on RUQ imaging. lipase normal. Ongoing LE edema.  - trend CMP  - LFTs improving  - Lactulose 20 g TID  Chronic normocytic anemia Thrombocytopenia - H/H at baseline.  Recent plt 160. Fluctuating at 88 today.  Could be related to  liver disease.  PTA ASA/ plavix which are held. - trend CBC, transfuse for Hgb < 7 - hold DAPT    GERD Hx gastric ulcer - PPI    Hypothyroidism  TSH 0.967 on 07/04/23. Not taking synthroid per fill records.    Severe PAD prior recent left SFA balloon angioplasty with stent - Holding home ASA/ plavix    Sacral pressure wound, POA BLE foot wounds Protein-calorie malnutrition - hx of ongoing wt loss, suspect his anasarca is masking weight loss - WOC consult  - RD consult    Poor mobility/ deconditioning  - reports limited mobility for months secondary to PAD, suspect PH, and other factors also contributing  - PT/ OT when appropriate    GOC - wife and children present on admission.  Remains full code for now, aware of MODS and high risk for further deterioration.  Has living will, would not want trach or PEG. Monitor him to see if significant improvement, otherwise continue GOC and consider PMT.   Best Practice (right click and "Reselect all SmartList Selections" daily)   Diet/type: tubefeeds DVT prophylaxis prophylactic heparin   Pressure ulcer(s): present on admission  GI prophylaxis: PPI Lines: Central line and Arterial Line Foley:  Yes, and it  is still needed Code Status:  full code Last date of multidisciplinary goals of care discussion [updated family 2/17]  Labs   CBC: Recent Labs  Lab 08/10/23 1251 08/11/23 0407 08/12/23 0437 08/13/23 0509 08/14/23 0356  WBC 13.0* 14.3* 14.1* 11.4* 10.4  HGB 11.4* 10.9* 10.2* 9.6* 9.5*  HCT 35.9* 35.6* 33.7* 32.6* 32.3*  MCV 87.3 89.0 91.1 93.1 95.0  PLT 111* 88* 70* 76* 85*    Basic Metabolic Panel: Recent Labs  Lab 08/08/23 1220 08/08/23 1742 08/09/23 0603 08/09/23 1108 08/09/23 1700 08/09/23 2039 08/10/23 0420 08/11/23 0407 08/12/23 0437 08/13/23 0509 08/14/23 0356  NA 136  135   < > 136   < >  --   --  138 140 144 148* 151*  K 4.4  4.4   < > 3.9   < >  --   --  3.7 3.7 3.8 3.2* 3.4*  CL 96*  102  --  100   --   --   --  101 103 103 104 108  CO2 22  --  22  --   --   --  23 24 26 30 29   GLUCOSE 141*  130*  --  153*  --   --   --  219* 227* 188* 132* 164*  BUN 24*  24*  --  27*  --   --   --  34* 42* 44* 40* 39*  CREATININE 1.50*  1.60*  --  1.68*  --   --   --  1.66* 1.60* 1.66* 1.38* 1.44*  CALCIUM 7.6*  --  7.7*  --   --   --  7.7* 7.5* 7.9* 7.9* 7.9*  MG 2.2  --  1.8  --  2.5*  --  2.2 2.2 2.1  --   --   PHOS 4.6  --   --   --  3.0 3.1 2.4* 2.6  --   --   --    < > = values in this interval not displayed.   GFR: Estimated Creatinine Clearance: 28.8 mL/min (A) (by C-G formula based on SCr of 1.44 mg/dL (H)). Recent Labs  Lab 08/08/23 1220 08/08/23 1431 08/08/23 1749 08/08/23 2209 08/09/23 0603 08/10/23 0420 08/11/23 0407 08/12/23 0437 08/13/23 0509 08/14/23 0356  PROCALCITON <0.10  --   --   --  2.04  --   --   --   --   --   WBC 4.9  --   --   --  8.9   < > 14.3* 14.1* 11.4* 10.4  LATICACIDVEN >9.0* 8.4* 8.3* 3.5*  --   --   --   --   --   --    < > = values in this interval not displayed.    Liver Function Tests: Recent Labs  Lab 08/10/23 0420 08/11/23 0407 08/12/23 0437 08/13/23 0509 08/14/23 0356  AST 564* 323* 204* 122* 83*  ALT 234* 200* 148* 102* 80*  ALKPHOS 67 73 78 71 73  BILITOT 1.3* 1.2 1.0 1.1 0.9  PROT 5.1* 5.2* 5.3* 5.5* 6.8  ALBUMIN 2.1* 2.1* 2.2* 2.0* 2.0*   Recent Labs  Lab 08/09/23 0603  LIPASE 18   Recent Labs  Lab 08/08/23 2209 08/12/23 0436  AMMONIA 40* 29    ABG    Component Value Date/Time   PHART 7.577 (H) 08/09/2023 1108   PCO2ART 27.5 (L) 08/09/2023 1108   PO2ART 61 (L) 08/09/2023 1108   HCO3 25.6 08/09/2023 1108  TCO2 26 08/09/2023 1108   ACIDBASEDEF 2.0 08/08/2023 1742   O2SAT 95 08/09/2023 1108     Coagulation Profile: Recent Labs  Lab 08/08/23 1220 08/09/23 2039  INR 1.5* 1.5*    Cardiac Enzymes: No results for input(s): "CKTOTAL", "CKMB", "CKMBINDEX", "TROPONINI" in the last 168 hours.  HbA1C: Hgb A1c  MFr Bld  Date/Time Value Ref Range Status  08/09/2023 10:54 AM 6.7 (H) 4.8 - 5.6 % Final    Comment:    (NOTE) Pre diabetes:          5.7%-6.4%  Diabetes:              >6.4%  Glycemic control for   <7.0% adults with diabetes     CBG: Recent Labs  Lab 08/13/23 2057 08/13/23 2104 08/13/23 2125 08/13/23 2343 08/14/23 0359  GLUCAP >600* 68* 237* 235* 143*      Critical care time: 35 minutes    Melody Comas, MD [facility] Office: [phone]   See Amion for personal pager PCCM on call pager [phone] until 7pm. Please call Elink 7p-7a. [phone]

## 2023-08-14 NOTE — Progress Notes (Addendum)
Subjective: No acute events overnight.  Patient is laying in bed with eyes open but does not track examiner in room, does not blink to threat  ROS: Unable to obtain due to poor mental status  Examination  Vital signs in last 24 hours: Temp:  [86 F (30 C)-100.2 F (37.9 C)] 100.2 F (37.9 C) (02/17 0530) Pulse Rate:  [99-125] 116 (02/17 0400) Resp:  [10-33] 28 (02/17 0530) BP: (114-145)/(65-87) 133/70 (02/17 0500) SpO2:  [95 %-100 %] 100 % (02/17 0809) FiO2 (%):  [40 %-50 %] 40 % (02/17 0808)  General: lying in bed, intubated Neuro: Patient appears awake with eyes open but does not track examiner, does not blink to threat, does not follow commands, does not have any forced gaze deviation, pupils are equally round and reactive to light, corneal reflexes intact, cough reflex is absent, does not withdraw to noxious stimuli in bilateral upper extremities, has withdrawal to noxious stimuli in bilateral lower extremities  Basic Metabolic Panel: Recent Labs  Lab 08/08/23 1220 08/08/23 1742 08/09/23 1700 08/09/23 2039 08/10/23 0420 08/11/23 0407 08/12/23 0437 08/13/23 0509 08/14/23 0356  NA 136  135   < >  --   --  138 140 144 148* 151*  K 4.4  4.4   < >  --   --  3.7 3.7 3.8 3.2* 3.4*  CL 96*  102   < >  --   --  101 103 103 104 108  CO2 22   < >  --   --  23 24 26 30 29   GLUCOSE 141*  130*   < >  --   --  219* 227* 188* 132* 164*  BUN 24*  24*   < >  --   --  34* 42* 44* 40* 39*  CREATININE 1.50*  1.60*   < >  --   --  1.66* 1.60* 1.66* 1.38* 1.44*  CALCIUM 7.6*   < >  --   --  7.7* 7.5* 7.9* 7.9* 7.9*  MG 2.2   < > 2.5*  --  2.2 2.2 2.1  --  2.4  PHOS 4.6  --  3.0 3.1 2.4* 2.6  --   --   --    < > = values in this interval not displayed.    CBC: Recent Labs  Lab 08/10/23 1251 08/11/23 0407 08/12/23 0437 08/13/23 0509 08/14/23 0356  WBC 13.0* 14.3* 14.1* 11.4* 10.4  HGB 11.4* 10.9* 10.2* 9.6* 9.5*  HCT 35.9* 35.6* 33.7* 32.6* 32.3*  MCV 87.3 89.0 91.1 93.1 95.0   PLT 111* 88* 70* 76* 85*     Coagulation Studies: No results for input(s): "LABPROT", "INR" in the last 72 hours.  Imaging personally reviewed  MRI brain without contrast 2/60/25: No evidence of anoxic/hypoxic brain injury. No acute abnormality. Bifrontal remote infarcts. Chronic right ICA occlusion.  ASSESSMENT AND PLAN:  73 year old male admitted to ICU after PEA arrest with high-quality CPR with ROSC in 15 minutes.   Cardiac arrest Transaminitis  -No further seizures.  Has been off sedation since 08/11/2023 around noon   Recommendations -Reduce Depakote to 250 mg every 12 hours, Keppra to 500 mg twice daily and Klonopin 2.5 mg 3 times daily minimize sedation  -Start amantadine 100 mg twice daily for neurostimulation -I discussed with patient wife at bedside that if patient does not want trach and PEG, then we can continue to minimize sedation but if by Wednesday patient's mental as well as respiratory status  does not improve, then it is very likely that he has suffered anoxic/hypoxic brain injury that is not visible on MRI and given his wishes, we may need to consider transitioning to comfort care -Also discussed that if seizures recur in the meantime, that would also suggest poor prognosis and would recommend transitioning to comfort care -Patient's wife understands and agrees -Discussed plan with ICU team by secure chat   I have spent a total of   37 minutes with the patient reviewing hospital notes,  test results, labs and examining the patient as well as establishing an assessment and plan that was discussed personally with the patient's wife at bedside.  > 50% of time was spent in direct patient care.       Lindie Spruce Epile[facility] For questions after 5pm please refer to AMION to reach the Neurologist on call

## 2023-08-15 DIAGNOSIS — J9601 Acute respiratory failure with hypoxia: Secondary | ICD-10-CM | POA: Diagnosis not present

## 2023-08-15 DIAGNOSIS — Z515 Encounter for palliative care: Secondary | ICD-10-CM | POA: Diagnosis not present

## 2023-08-15 DIAGNOSIS — I469 Cardiac arrest, cause unspecified: Secondary | ICD-10-CM | POA: Diagnosis not present

## 2023-08-15 DIAGNOSIS — K7031 Alcoholic cirrhosis of liver with ascites: Secondary | ICD-10-CM | POA: Diagnosis not present

## 2023-08-15 DIAGNOSIS — R569 Unspecified convulsions: Secondary | ICD-10-CM | POA: Diagnosis not present

## 2023-08-15 DIAGNOSIS — E43 Unspecified severe protein-calorie malnutrition: Secondary | ICD-10-CM | POA: Diagnosis not present

## 2023-08-15 DIAGNOSIS — I4891 Unspecified atrial fibrillation: Secondary | ICD-10-CM | POA: Diagnosis not present

## 2023-08-15 LAB — COMPREHENSIVE METABOLIC PANEL
ALT: 60 U/L — ABNORMAL HIGH (ref 0–44)
AST: 59 U/L — ABNORMAL HIGH (ref 15–41)
Albumin: 1.9 g/dL — ABNORMAL LOW (ref 3.5–5.0)
Alkaline Phosphatase: 66 U/L (ref 38–126)
Anion gap: 7 (ref 5–15)
BUN: 41 mg/dL — ABNORMAL HIGH (ref 8–23)
CO2: 33 mmol/L — ABNORMAL HIGH (ref 22–32)
Calcium: 8.1 mg/dL — ABNORMAL LOW (ref 8.9–10.3)
Chloride: 112 mmol/L — ABNORMAL HIGH (ref 98–111)
Creatinine, Ser: 1.5 mg/dL — ABNORMAL HIGH (ref 0.61–1.24)
GFR, Estimated: 49 mL/min — ABNORMAL LOW (ref >60)
Glucose, Bld: 178 mg/dL — ABNORMAL HIGH (ref 70–99)
Potassium: 4.4 mmol/L (ref 3.5–5.1)
Sodium: 152 mmol/L — ABNORMAL HIGH (ref 135–145)
Total Bilirubin: 1 mg/dL (ref 0.0–1.2)
Total Protein: 5.6 g/dL — ABNORMAL LOW (ref 6.5–8.1)

## 2023-08-15 LAB — GLUCOSE, CAPILLARY
Glucose-Capillary: 130 mg/dL — ABNORMAL HIGH (ref 70–99)
Glucose-Capillary: 146 mg/dL — ABNORMAL HIGH (ref 70–99)
Glucose-Capillary: 146 mg/dL — ABNORMAL HIGH (ref 70–99)
Glucose-Capillary: 156 mg/dL — ABNORMAL HIGH (ref 70–99)

## 2023-08-15 LAB — CBC
HCT: 31.5 % — ABNORMAL LOW (ref 39.0–52.0)
Hemoglobin: 9 g/dL — ABNORMAL LOW (ref 13.0–17.0)
MCH: 27.4 pg (ref 26.0–34.0)
MCHC: 28.6 g/dL — ABNORMAL LOW (ref 30.0–36.0)
MCV: 96 fL (ref 80.0–100.0)
Platelets: 86 10*3/uL — ABNORMAL LOW (ref 150–400)
RBC: 3.28 MIL/uL — ABNORMAL LOW (ref 4.22–5.81)
RDW: 20.4 % — ABNORMAL HIGH (ref 11.5–15.5)
WBC: 8.5 10*3/uL (ref 4.0–10.5)
nRBC: 1.3 % — ABNORMAL HIGH (ref 0.0–0.2)

## 2023-08-15 LAB — MAGNESIUM: Magnesium: 2.4 mg/dL (ref 1.7–2.4)

## 2023-08-15 LAB — AMMONIA: Ammonia: 23 umol/L (ref 9–35)

## 2023-08-15 LAB — TRIGLYCERIDES: Triglycerides: 68 mg/dL (ref <150)

## 2023-08-15 MED ORDER — LACTULOSE 10 GM/15ML PO SOLN
20.0000 g | Freq: Every day | ORAL | Status: DC
  Administered 2023-08-15: 20 g

## 2023-08-15 MED ORDER — GLYCOPYRROLATE 0.2 MG/ML IJ SOLN: 0.2000 mg | INTRAMUSCULAR | Status: DC | PRN

## 2023-08-15 MED ORDER — ACETAMINOPHEN 650 MG RE SUPP: 650.0000 mg | Freq: Four times a day (QID) | RECTAL | Status: DC | PRN

## 2023-08-15 MED ORDER — MORPHINE BOLUS VIA INFUSION: 5.0000 mg | INTRAVENOUS | Status: DC | PRN

## 2023-08-15 MED ORDER — GLYCOPYRROLATE 0.2 MG/ML IJ SOLN
0.2000 mg | INTRAMUSCULAR | Status: DC | PRN
  Filled 2023-08-15: qty 1

## 2023-08-15 MED ORDER — POLYVINYL ALCOHOL 1.4 % OP SOLN: 1.0000 [drp] | Freq: Four times a day (QID) | OPHTHALMIC | Status: DC | PRN

## 2023-08-15 MED ORDER — JUVEN PO PACK: 1.0000 | PACK | Freq: Two times a day (BID) | ORAL | Status: DC

## 2023-08-15 MED ORDER — GLYCOPYRROLATE 1 MG PO TABS: 1.0000 mg | ORAL_TABLET | ORAL | Status: DC | PRN

## 2023-08-15 MED ORDER — FENTANYL CITRATE PF 50 MCG/ML IJ SOSY: 25.0000 ug | PREFILLED_SYRINGE | INTRAMUSCULAR | Status: DC | PRN

## 2023-08-15 MED ORDER — BANATROL TF EN LIQD
60.0000 mL | Freq: Two times a day (BID) | ENTERAL | Status: DC
  Administered 2023-08-15: 60 mL

## 2023-08-15 MED ORDER — MORPHINE 100MG IN NS 100ML (1MG/ML) PREMIX INFUSION
0.0000 mg/h | INTRAVENOUS | Status: DC
  Filled 2023-08-15: qty 100

## 2023-08-15 MED ORDER — ADULT MULTIVITAMIN W/MINERALS CH: 1.0000 | ORAL_TABLET | Freq: Every day | ORAL | Status: DC

## 2023-08-15 MED ORDER — MIDAZOLAM HCL 2 MG/2ML IJ SOLN
2.0000 mg | INTRAMUSCULAR | Status: DC | PRN
  Filled 2023-08-15: qty 2

## 2023-08-15 MED ORDER — ACETAMINOPHEN 325 MG PO TABS: 650.0000 mg | ORAL_TABLET | Freq: Four times a day (QID) | ORAL | Status: DC | PRN

## 2023-08-15 MED ORDER — FREE WATER: 200.0000 mL | Freq: Three times a day (TID) | Status: DC

## 2023-08-20 MED FILL — Albuterol Sulfate Soln Nebu 0.083% (2.5 MG/3ML): RESPIRATORY_TRACT | Qty: 3 | Status: AC

## 2023-08-20 MED FILL — Ipratropium-Albuterol Nebu Soln 0.5-2.5(3) MG/3ML: RESPIRATORY_TRACT | Qty: 3 | Status: AC

## 2023-08-20 MED FILL — Phenylephrine HCl IV Soln 10 MG/ML: INTRAVENOUS | Qty: 2 | Status: AC

## 2023-08-20 MED FILL — Midazolam HCl Inj 5 MG/5ML (Base Equivalent): INTRAMUSCULAR | Qty: 5 | Status: AC

## 2023-08-20 MED FILL — Fentanyl Citrate Preservative Free (PF) Inj 100 MCG/2ML: INTRAMUSCULAR | Qty: 2 | Status: AC

## 2023-08-26 NOTE — Consult Note (Signed)
Consultation Note Date: 08/11/2023   Patient Name: Todd [name]  DOB: 12-Feb-1951  MRN: [id number]  Age / Sex: 73 y.o., male  PCP: Todd Nevins, MD Referring Physician: Lorin Glass, MD  Reason for Consultation: Establishing goals of care  HPI/Patient Profile: 73 y.o. male   admitted on 08/08/2023 with   PMH of tobacco abuse, HTN, atrial fibrillation (not on AC given prior GIBs), pulmonary hypertension, HLD, ETOH abuse w/hx of withdrawal seizures, PAD, hypothyroidism, GERD, chronic pancreatitis, pancreatic duct dilation/ IPMN, and suspected cirrhosis presenting to [facility] ER due to altered mental status.    Family reports ongoing exertional SOB since recent discharge.  Recent hospitalization for 1/30- 2/5 left foot pain with ulcers, AKI, and SOB with anasarca treated with IV diureses;  underwent balloon angioplasty with left SFA stent placement with vascular surgery on 2/3.    Wife reported concerns of AMS, SBP 80's, with shaking but pt had returned to his baseline by EMS arrival and refused transport.  An hour later, pt was in respiratory distress and hypoxic.  By arrival to ER by EMS, HR had dropped to 20's and become pulseless.  ACLS measures provided including CPR, intubation, with ROSC after 15 mins.    ER workup significant for plts 82, AG 18, iCa 0.8, AST/ ALT 271/ 95, BNP 1697 (1769 on 1/30), trop 86, lactic > 9, INR 1.5, CXR showing extensive interstitial opacities favoring pulmonary edema with right small to moderate pleural effusion.  SARS 2 positive.  ABG 7.08/ 66/ 97/ 19.7.  Requiring vasopressor support.  CVL placed.  To be transferred to [facility] for higher level of care  Today is day 7 of this hospitalization   Significant Hospital events  2/11 admit post arrest, intubated, transfer to [facility] ICU 2/13 seizure activity, versed bolus and valproate x1, bloody BM 2/15 arterial line malfunction -  concern for radial artery clot. Heparin started. CTA right arm done - nondiagnostic. 2/16 brain MRI without acute findings, unable to wean off ventilator 2/18 per neurology note, concern for significant neurologic injury, long-term poor prognosis  Family face treatment option decisions, advanced directive decisions and anticipatory care needs.   Clinical Assessment and Goals of Care:  This NP Todd [name] reviewed medical records, received report from team, assessed the patient and then meet at the patient's bedside along with his wife to discuss diagnosis, prognosis, GOC, EOL wishes disposition and options.   Concept of Palliative Care was introduced as specialized medical care for people and their families living with serious illness.  If focuses on providing relief from the symptoms and stress of a serious illness.  The goal is to improve quality of life for both the patient and the family. Values and goals of care important to patient and family were attempted to be elicited.  Created space and opportunity for family to explore thoughts and feelings regarding current medical situation.  Wife understands the seriousness of the current medical situation and verbalizes an understanding that her husband would not want to live dependent on  life-sustaining measures.  In contemplation of trach and PEG family is leaning towards a more comfort path.     A  discussion was had today regarding advanced directives.  Concepts specific to code status, artifical feeding and hydration, continued IV antibiotics and rehospitalization was had.    The difference between a aggressive medical intervention path  and a palliative comfort care path for this patient at this time was had.    MOST form and hard choices booklet left for review.   Wife plans to coordinate family meeting for ongoing conversation regarding next steps in the treatment plan.   She will get back to me later today for a specific time likely  sometime tomorrow.   Questions and concerns addressed.                Later I early afternoon I was notified that daughter was at the bedside, wanting to discuss next steps in her father's care.  I spoke to Todd [name], she understands the seriousness and the associated poor prognosis.  The situation has been  very hard for her,  today she verbalizes that she believes believes shifting to a comfort path, liberating patient from the ventilator and allowing for a natural death is the right thing for Todd [name].  Todd [name] tells me that family are attempting to get to the hospital today, prior to expected snow, in-order to shift to comfort today, allowing for a natural death.  If they cannot coordinate for today, they plan to come in the morning.   Emotional support offered, education offered on the steps and expectations of a liberation from the ventilator.  Education offered on the natural trajectory and expectations at EOL.  Todd [name] shares her father had hoped for a home death. She understands that,  that is not likely feasible.         I discussed the situation with CCM and bedside RN.  I will not be in the hospital after 1500 hours today.   Dr Todd [name] will work with family as needs arise today/tonight.     PMT will continue to support holistically.         Wife is main Management consultant with the support of her family      SUMMARY OF RECOMMENDATIONS    Code Status/Advance Care Planning: DNR/DNI   Symptom Management:  Per attending   Palliative Prophylaxis:  Pain assessment   Additional Recommendations (Limitations, Scope, Preferences): Continue current medical interventions until family makes decision for comfort path.  Psycho-social/Spiritual:  Desire for further Chaplaincy support:no Additional Recommendations: Emotional support  Prognosis:  Likely hours to days once way way extubation   Discharge Planning: expect hospital death       Primary Diagnoses: Present on Admission:   Cardiac arrest [facility])   I have reviewed the medical record, interviewed the patient and family, and examined the patient. The following aspects are pertinent.  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   History of GI bleed    Hypothyroidism    Internal hemorrhoids  Colonoscopy 11/09   Osteoarthritis    Peripheral vascular disease (HCC)    Schatzki's ring    EGD 11/09   Social History   Socioeconomic History   Marital status: Married    Spouse name: Not on file   Number of children: 3   Years of education: Not on file   Highest education level: Not on file  Occupational History   Occupation: Retired    Associate Professor: [employer]  Tobacco Use   Smoking status: Every Day    Current packs/day: 0.50    Types: Cigarettes    Passive exposure: Current   Smokeless tobacco: Never   Tobacco comments:    0.25-0.5 packs per day  Vaping Use   Vaping status: Never Used  Substance and Sexual Activity   Alcohol use: Yes    Alcohol/week: 5.0 standard drinks of alcohol    Types: 5 Cans of beer per week    Comment: less than a 6 pack of beer per week   Drug use: No   Sexual activity: Not Currently  Other Topics Concern   Not on file  Social History Narrative   Not on file   Social Drivers of Health   Financial Resource Strain: Not on file  Food Insecurity: No Food Insecurity (07/28/2023)   Hunger Vital Sign    Worried About Running Out of Food in the Last Year: Never true    Ran Out of Food in the Last Year: Never true  Transportation Needs: No Transportation Needs (07/28/2023)   PRAPARE - Administrator, Civil Service (Medical): No    Lack of  Transportation (Non-Medical): No  Physical Activity: Not on file  Stress: Not on file  Social Connections: Moderately Isolated (07/28/2023)   Social Connection and Isolation Panel [NHANES]    Frequency of Communication with Friends and Family: More than three times a week    Frequency of Social Gatherings with Friends and Family: Never    Attends Religious Services: Never    Database administrator or Organizations: No    Attends Engineer, structural: Never    Marital Status: Married   Family History  Problem Relation Age of Onset   Coronary artery disease Father    Coronary artery disease Sister        MI at age 25   Scheduled Meds:  amantadine  100 mg Per Tube BID   arformoterol  15 mcg Nebulization BID   aspirin  81 mg Per Tube Daily   budesonide (PULMICORT) nebulizer solution  0.5 mg Nebulization BID   Chlorhexidine Gluconate Cloth  6 each Topical Daily   clonazePAM  0.5 mg Per Tube TID   feeding supplement (PROSource TF20)  60 mL Per Tube Daily   furosemide  40 mg Intravenous Daily   heparin  5,000 Units Subcutaneous Q12H   insulin aspart  0-15 Units Subcutaneous Q4H   lactulose  20 g Per Tube BID   leptospermum manuka honey  1 Application Topical Daily   mouth rinse  15 mL Mouth Rinse Q2H   pantoprazole (PROTONIX) IV  40 mg Intravenous QHS   revefenacin  175 mcg Nebulization Daily   thiamine (VITAMIN B1) injection  100 mg Intravenous Daily   Continuous Infusions:  amiodarone 30 mg/hr (08/06/2023 0700)   dexmedetomidine (PRECEDEX) IV infusion Stopped (08/12/23 1204)   feeding supplement (OSMOLITE 1.5 CAL) 45 mL/hr at 08/24/2023 0700   levETIRAcetam Stopped (08/14/23 2148)   valproate sodium Stopped (08/14/23 2259)  PRN Meds:.acetaminophen, albuterol, fentaNYL (SUBLIMAZE) injection, mouth rinse Medications Prior to Admission:  Prior to Admission medications   Medication Sig Start Date End Date Taking? Authorizing Provider  aspirin EC 81 MG EC tablet Take 1  tablet (81 mg total) by mouth daily at 6 (six) AM. Swallow whole. 03/17/21  Yes Setzer, Lynnell Jude, PA-C  cholecalciferol (VITAMIN D3) 25 MCG (1000 UNIT) tablet Take 1,000 Units by mouth daily.   Yes [provider]  clopidogrel (PLAVIX) 75 MG tablet TAKE ONE TABLET BY MOUTH ONCE DAILY. 05/05/23  Yes Jonelle Sidle, MD  diclofenac Sodium (VOLTAREN) 1 % GEL Apply 4 g topically 4 (four) times daily as needed (pain). 01/31/23  Yes [provider]  doxycycline (VIBRAMYCIN) 100 MG capsule Take 100 mg by mouth 2 (two) times daily. 08/07/23  Yes [provider]  FARXIGA 10 MG TABS tablet Take 1 tablet (10 mg total) by mouth daily before breakfast. 05/30/23  Yes Romie Minus, MD  furosemide (LASIX) 20 MG tablet Take 1 tablet (20 mg total) by mouth daily. Patient taking differently: Take 60 mg by mouth daily. 06/12/23  Yes Romie Minus, MD  lipase/protease/amylase (CREON) 36000 UNITS CPEP capsule Take 2 capsules (72,000 Units total) by mouth 3 (three) times daily before meals. And 1 capsule with snacks 03/15/23 03/14/24 Yes Carver, Hennie Duos, DO  metolazone (ZAROXOLYN) 2.5 MG tablet Take 2.5 mg by mouth daily. 08/07/23  Yes [provider]  potassium chloride SA (KLOR-CON M) 20 MEQ tablet Take 20 mEq by mouth daily. 08/07/23  Yes [provider]  rosuvastatin (CRESTOR) 10 MG tablet TAKE (1) TABLET BY MOUTH ONCE DAILY. 09/09/20  Yes Maeola Harman, MD  Ensure (ENSURE) Take 1 Can by mouth daily.    [provider]  levothyroxine (SYNTHROID) 25 MCG tablet Take 25 mcg by mouth daily before breakfast. Patient not taking: Reported on 08/09/2023    [provider]  pantoprazole (PROTONIX) 40 MG tablet Take 1 tablet (40 mg total) by mouth 2 (two) times daily. Patient not taking: Reported on 08/09/2023 11/03/22 11/03/23  Lanelle Bal, DO   No Known Allergies Review of Systems  Unable to perform ROS: Intubated    Physical  Exam Constitutional:      Appearance: He is underweight. He is ill-appearing.     Interventions: He is intubated.  Cardiovascular:     Rate and Rhythm: Tachycardia present.  Pulmonary:     Effort: He is intubated.  Musculoskeletal:     Comments: Generalized muscle atrophy  Skin:    General: Skin is warm and dry.  Neurological:     Mental Status: He is unresponsive.     Comments: -     Vital Signs: BP (!) 104/59   Pulse (!) 105   Temp 99.5 F (37.5 C)   Resp (!) 23   Ht 5' 7.99" (1.727 m)   Wt 35.9 kg   SpO2 98%   BMI 12.04 kg/m  Pain Scale: CPOT       SpO2: SpO2: 98 % O2 Device:SpO2: 98 % O2 Flow Rate: .   IO: Intake/output summary:  Intake/Output Summary (Last 24 hours) at 08/19/2023 0810 Last data filed at 07/29/2023 0700 Gross per 24 hour  Intake 1973.84 ml  Output 2200 ml  Net -226.16 ml    LBM: Last BM Date : 08/14/23 Baseline Weight: Weight: 59 kg Most recent weight: Weight: 35.9 kg     Palliative Assessment/Data:      Time  90 minutes  Discussed with Dr Todd [name] and bedside RN   Signed by: Todd Creed, NP   Please contact Palliative Medicine Team phone at [phone] for questions and concerns.  For individual provider: See Loretha Stapler

## 2023-08-26 NOTE — Progress Notes (Signed)
Patient was compassionately extubated to room air surrounded by family with RN in the room. Condolences offered to patient's family members following extubation.

## 2023-08-26 NOTE — Death Summary Note (Signed)
Death Summary    Patient details  Name: Todd [name]  [id number]  [birth year]  08/21/23  7  Date of death: 08/28/23 Time of Death: 1910   Profile data   Living situation: home  Independent with ADLs: yes Bed bound or WC bound: no Code status  Full code at time of arrival to emergency department with no known advanced directives  Reason for admission   Cardiac arrest   Preliminary Cause of death   COVID-19 Acute respiratory distress syndrome   Secondary diagnoses, contributing co-morbidities & complicating factors  Cardiac arrest  Acute hypoxic and hypercarbic respiratory failure  Acute respiratory distress syndrome COVID-19 pneumonia  Acute metabolic encephalopathy  Acute kidney injury  Shock liver  Seizures  Atrial fibrillation with RVR  Pulmonary hypertension  Tobacco use  Chronic normocytic anemia  Thrombocytopenia  GERD  Hypothyroidism  Severe peripheral arterial disease  Deconditioning   Condition on arrival (check all that apply)   [name] is a 73 y.o. male admitted from home.  Patient did not have advanced directive on arrival to hospital.   Patient presented to the hospital acute on chronically ill with circulatory and respiratory arrest, acute metabolic encephalopathy, acute renal failure, shock liver.   Hospital course   73 year old male that presented to the emergency department on Aug 21, 2023 in cardiac arrest. Initial call to EMS from family that patient was in respiratory distress. En route to ER patient O2 sats began dropping, he became bradycardic and had cardiac arrest. ACLS was initiated. Arrived to ED in cardiac arrest. He was intubated and ED achieved ROSC after 15 minutes. Hypotensive after ROSC and IV fluids were ordered.  Initial lab work up with shock liver, AKI, CXR with extensive pulmonary edema vs pneumonia, COVID -19 positive. He required vasopressor support and invasive lines to be inserted. He was started on  steroids, as well as eeg and work up for seizures possibly related to alcohol withdrawal vs anoxic injury. He was loaded with keppra and started on versed drip. Next day patient had BRBPR which resolved spontaneously. EEG notably for seizure activity. He had subsequent MRI brain which did not show any significant abnormalities. After cessation of seizures, sedation was weaned; however, patient had little evidence of cerebral function. Given poor mental status, multiorgan dysfunction, neurology was consulted to help with neuro prognostication and felt prognosis indeterminate and likely will require tracheostomy and peg if he is to make it through hospitalization. It was determined that he had living will stating he would not want trach or PEG placement. Palliative care was consulted. After long family discussions, family agreed to transition to comfort care measures. He was compassionately extubated and passed at 1910.   [name], PA-C [facility] 08/17/23 5:05 PM  Please see [email or web address] for pager details.  From 7A-7P if no response, please call [phone] After hours, please call ELink [phone]

## 2023-08-26 NOTE — Progress Notes (Signed)
Subjective: Febrile overnight with Tmax of 101.8 Fahrenheit.  Daughter at bedside  ROS: Unable to obtain due to poor mental status  Examination  Vital signs in last 24 hours: Temp:  [98.6 F (37 C)-101.8 F (38.8 C)] 100.4 F (38 C) (02/18 1130) Pulse Rate:  [89-126] 110 (02/18 1130) Resp:  [17-33] 27 (02/18 1130) BP: (78-146)/(39-86) 78/52 (02/18 1130) SpO2:  [85 %-100 %] 100 % (02/18 1130) FiO2 (%):  [40 %-60 %] 60 % (02/18 1111) Weight:  [35.9 kg] 35.9 kg (02/18 0500)  General: lying in bed, intubated Neuro: Opens eyes to noxious stimulation, does not follow commands, does not track examiner, intermittently blinking, corneal reflex intact, cough reflex absent, does not withdraw to noxious stimuli in all extremities.  Basic Metabolic Panel: Recent Labs  Lab 08/09/23 1700 08/09/23 2039 08/10/23 0420 08/11/23 0407 08/12/23 0437 08/13/23 0509 08/14/23 0356 08/03/2023 0341  NA  --   --  138 140 144 148* 151* 152*  K  --   --  3.7 3.7 3.8 3.2* 3.4* 4.4  CL  --   --  101 103 103 104 108 112*  CO2  --   --  23 24 26 30 29  33*  GLUCOSE  --   --  219* 227* 188* 132* 164* 178*  BUN  --   --  34* 42* 44* 40* 39* 41*  CREATININE  --   --  1.66* 1.60* 1.66* 1.38* 1.44* 1.50*  CALCIUM  --   --  7.7* 7.5* 7.9* 7.9* 7.9* 8.1*  MG 2.5*  --  2.2 2.2 2.1  --  2.4 2.4  PHOS 3.0 3.1 2.4* 2.6  --   --   --   --     CBC: Recent Labs  Lab 08/11/23 0407 08/12/23 0437 08/13/23 0509 08/14/23 0356 08/20/2023 0341  WBC 14.3* 14.1* 11.4* 10.4 8.5  HGB 10.9* 10.2* 9.6* 9.5* 9.0*  HCT 35.6* 33.7* 32.6* 32.3* 31.5*  MCV 89.0 91.1 93.1 95.0 96.0  PLT 88* 70* 76* 85* 86*     Coagulation Studies: No results for input(s): "LABPROT", "INR" in the last 72 hours.  Imaging No new brain imaging overnight   ASSESSMENT AND PLAN:  73 year old male admitted to ICU after PEA arrest with high-quality CPR with ROSC in 15 minutes.   Cardiac arrest  -No further seizures.  Has been off sedation since  08/11/2023 around noon   Recommendations -DC Klonopin and Depakote to minimize sedation -Continue Keppra 750 mg twice daily -Discussed with ICU team, patient has not been able to be weaned off of ventilator.  Per family, patient did not want trach and PEG or quality of life any worse than preadmission.  At this point, I am concerned that patient has suffered significant neurologic injury and will most likely require trach and PEG and a prolonged time in rehab if we were to see any improvement which is not aligned with patient's long-term wishes.  Therefore I would suggest transitioning to comfort care.  Patient's wife is coming later today and palliative care team has been called.  I discussed my recommendations with ICU team and patient's daughter at bedside as well as patient's wife on phone -Neurology will sign off.  Please call us for any further questions.     I have spent a total of   35 minutes with the patient reviewing hospital notes,  test results, labs and examining the patient as well as establishing an assessment and plan that was discussed personally  with the patient's daughter at bedside.  > 50% of time was spent in direct patient care.    Lindie Spruce Epilepsy [facility] For questions after 5pm please refer to AMION to reach the Neurologist on call

## 2023-08-26 NOTE — Progress Notes (Signed)
Chaplain responded to request for spiritual support for family who are all in agreement it is time to let their beloved Todd [name] move to comfort care so the Todd [name] can take him home.  Chaplain affirmed this decision with the family, sharing with them the last greatest act of love is to let him go.  And that this great love comes at a great price.  Family requested prayers. All gathered around bedside celebrating their loved one's life and thanking God for the gift of salvation for Todd [name] and the promise that they will see him again.  Chaplain is on standby all night should the family need additional support.  Vernell Morgans Chaplain

## 2023-08-26 NOTE — TOC Initial Note (Signed)
Transition of Care [facility]) - Initial/Assessment Note    Patient Details  Name: Todd [name] MRN: [id number] Date of Birth: 1950/11/01  Transition of Care [facility]) CM/SW Contact:    Marliss Coots, LCSW Phone Number: 08/19/2023, 3:39 PM  Clinical Narrative:                   3:39 PM Per chart review, patient is now under comfort care and is to be extubated today upon family arrival to patient's bedside.   Expected Discharge Plan and Services       Living arrangements for the past 2 months: Single Family Home                                      Prior Living Arrangements/Services Living arrangements for the past 2 months: Single Family Home Lives with:: Adult Children Patient language and need for interpreter reviewed:: Yes              Criminal Activity/Legal Involvement Pertinent to Current Situation/Hospitalization: No - Comment as needed  Activities of Daily Living      Permission Sought/Granted Permission sought to share information with : Family Supports Permission granted to share information with : No (Contact information on chart)  Share Information with NAME: Kendrick Haapala     Permission granted to share info w Relationship: Spouse  Permission granted to share info w Contact Information: [phone]  Emotional Assessment   Attitude/Demeanor/Rapport: Unable to Assess Affect (typically observed): Unable to Assess   Alcohol / Substance Use: Not Applicable Psych Involvement: No (comment)  Admission diagnosis:  Cardiac arrest (HCC) [I46.9] Lactic acidosis [E87.20] Acute pulmonary edema (HCC) [J81.0] Acute respiratory failure with hypoxia and hypercapnia (HCC) [J96.01, J96.02] Acute congestive heart failure, unspecified heart failure type [facility]) [I50.9] Patient Active Problem List   Diagnosis Date Noted   Protein-calorie malnutrition, severe 08/09/2023   Cardiac arrest (HCC) 08/08/2023   Anasarca 07/27/2023   Nicotine abuse 07/27/2023    AKI (acute kidney injury) (HCC) 07/27/2023   Pulmonary artery hypertension (HCC) 07/27/2023   Sensorineural hearing loss, bilateral 06/23/2023   Pancreatic insufficiency 06/08/2023   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   Hematochezia 10/20/2022   ABLA (acute blood loss anemia) 10/20/2022   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   Stenosis of inferior mesenteric artery (HCC) 10/20/2022   GI bleed 10/17/2022   Left leg cellulitis 10/17/2022   Generalized weakness 10/17/2022   Hypoalbuminemia due to protein-calorie malnutrition (HCC) 10/17/2022   Leukocytosis 10/17/2022   Lactic acidosis 10/17/2022   Mixed hyperlipidemia 10/17/2022   Carotid stenosis 02/02/2022   PAD (peripheral artery disease) (HCC) 03/12/2021   Gout    Current smoker    ALCOHOL ABUSE 09/25/2008   Essential hypertension 09/25/2008   Atrial fibrillation (HCC) 09/25/2008   GASTRITIS 09/25/2008   CARDIAC MURMUR, HX OF 09/25/2008   PCP:  Elfredia Nevins, MD Pharmacy:   [facility] [place], [place] - [address] [place] [address] Phone: [phone] Fax: [phone]     Social Drivers of Health (SDOH) Social History: SDOH Screenings   Food Insecurity: No Food Insecurity (07/28/2023)  Housing: Low Risk  (07/28/2023)  Transportation Needs: No Transportation Needs (07/28/2023)  Utilities: Not At Risk (07/28/2023)  Social Connections: Moderately Isolated (07/28/2023)  Tobacco Use: High Risk (08/08/2023)   SDOH Interventions:     Readmission Risk Interventions  07/23/2021    3:52 PM 03/16/2021    4:57 PM  Readmission Risk Prevention Plan  Post Dischage Appt Complete Complete  Medication Screening Complete Complete  Transportation Screening Complete Complete

## 2023-08-26 NOTE — Progress Notes (Signed)
Patient went asystole at 1910. This RN and Barbaraann Cao RN pronounced time of death.

## 2023-08-26 NOTE — Progress Notes (Addendum)
Discussed with patient's wife and daughter. They are in agreement to transition to comfort care measures after discussion with PCCM and neurology teams. Family coming this afternoon, plan for extubation after all family has arrived. Comfort measures ordered and adjusted visitation status.  -Code Status changed to DNR-Comfort -Comfort care measures ordered -Consult to spiritual care for EOL -Plan for extubation after all family has arrived, bedside RN aware

## 2023-08-26 NOTE — Progress Notes (Signed)
Nutrition Follow-up  DOCUMENTATION CODES:  Underweight, Severe malnutrition in context of social or environmental circumstances  INTERVENTION:  Continue tube feeding via OGT: Osmolite 1.5 at 45 ml/h (1080 ml per day) Free water q8h  Prosource TF20 60 ml 1x/d Provides 1700 kcal, 88 gm protein, 823 ml free water daily ( , TF+flush) Thiamine 100mg  daily, add MVI with minerals daily Juven BID, each packet provides 95 calories, 2.5 grams of protein, and micronutrients to support wound healing Banatrol BID to add bulk to stool. Each packet provides 5g soluble fiber   NUTRITION DIAGNOSIS:  Severe Malnutrition related to social / environmental circumstances (EtOH abuse) as evidenced by severe fat depletion, severe muscle depletion. - remains applicable  GOAL:  Patient will meet greater than or equal to 90% of their needs - progressing, being met with TF at goal  MONITOR:  TF tolerance, I & O's, Vent status, Labs, Weight trends  REASON FOR ASSESSMENT:  Consult Enteral/tube feeding initiation and management  ASSESSMENT:  Pt with hx of A-fib, PAD, HTN, HLD, EtOH abuse, GERD, hypothyroidism, chronic pancreatitis, pancreatic duct dilation/ IPMN, and suspected cirrhosis presented to [facility] with respiratory distress and hypoxia requiring CPR and intubation. Positive for COVID19. Transferred to [facility].  2/11 - unresponsive at home, EMS started CPR and transported to [facility] ED, intubated, transferred to [facility]   Pt resting in bed at the time of assessment. Remains intubated on vent support. Minimally responsive. PMT consulted and meeting with family today  Discussed in rounds, once GOC are determined, pt will either move towards a more comfort focused approach or will need to escalate care to placement of trach and PEG.  Severe weight loss noted, unsure of accuracy of weights but edema was present on admission and pt has been undergoing diuresis.   MV: 14.9 L/min Temp (24hrs),  Avg:100.1 F (37.8 C), Min:98.6 F (37 C), Max:101.8 F (38.8 C)  Admit weight: 59 kg  Current weight: 35.9 kg  - edema present on admission, being diuresed which may account for weight loss. ? Accuracy of weights with dramatic change in <1 week   Intake/Output Summary (Last 24 hours) at 07/31/2023 1140 Last data filed at 08/19/2023 1000 Gross per 24 hour  Intake 1840.43 ml  Output 1900 ml  Net -59.57 ml  Net IO Since Admission: -877.82 mL [08/05/2023 1140]  Drains/Lines: OGT 18 Fr. ([place] in stomach) CVC Triple Lumen Right IJ FMS out x 24 hours, placed 2/16 UOP x 24 hours  Nutritionally Relevant Medications: Scheduled Meds:  (PROSource TF20)  60 mL Per Tube Daily   free water  200 mL Per Tube Q8H   furosemide  40 mg Intravenous Daily   insulin aspart  0-15 Units Subcutaneous Q4H   lactulose  20 g Per Tube Daily   pantoprazole  40 mg Intravenous QHS   thiamine (VITAMIN B1)  100 mg Intravenous Daily   Continuous Infusions:  feeding supplement (OSMOLITE 1.5 CAL) 45 mL/hr at 08/04/2023 0700   Labs Reviewed: Na 152, chloride 112 BUN 41, creatinine 1.5 CBG ranges from 72-389 mg/dL over the last 24 hours  NUTRITION - FOCUSED PHYSICAL EXAM: Flowsheet Row Most Recent Value  Orbital Region Severe depletion  Upper Arm Region Severe depletion  Thoracic and Lumbar Region Severe depletion  Buccal Region Severe depletion  Temple Region Severe depletion  Clavicle Bone Region Severe depletion  Clavicle and Acromion Bone Region Severe depletion  Scapular Bone Region Severe depletion  Dorsal Hand Severe depletion  Patellar  Region Severe depletion  Anterior Thigh Region Severe depletion  Posterior Calf Region Moderate depletion  [pitting edema present]  Edema (RD Assessment) Moderate  [pitting edema to the BLE, mild hands]  Hair Reviewed  Eyes Reviewed  Mouth Reviewed  Skin Reviewed  Nails Reviewed    Diet Order:   Diet Order             Diet NPO time  specified  Diet effective now                   EDUCATION NEEDS:  Not appropriate for education at this time  Skin:  Skin Assessment: Reviewed RN Assessment Skin Tear: - left wrist (1 x 0.5 cm) Partial Thickness Wounds: - left pretibial region x 3 (1 x 1 cm each) Blister: - left foot (10 x 6 cm) Stage 1: - sacrum  Last BM:  2/17 - type 7  Height:  Ht Readings from Last 1 Encounters:  08/08/23 5' 7.99" (1.727 m)    Weight:  Wt Readings from Last 1 Encounters:  08/12/2023 35.9 kg    Ideal Body Weight:  70 kg  BMI:  Body mass index is 12.04 kg/m.  Estimated Nutritional Needs:  Kcal:  1600-1800 kcal/d Protein:  80-100g/d Fluid:  1.8-2L/d    Greig Castilla, RD, LDN Registered Dietitian II Please reach out via secure chat Weekend on-call pager # available in [facility]

## 2023-08-26 NOTE — Progress Notes (Addendum)
NAME:  Todd [name], MRN:  [id number], DOB:  [birth year], LOS: 7 ADMISSION DATE:  08/08/2023, CONSULTATION DATE:  08/08/2023 REFERRING MD:  Dr. Rhunette Croft, CHIEF COMPLAINT:  cardiac arrest   History of Present Illness:  Pt encephalopathic, therefore HPI obtained from EMR.    33 yoM with PMH of tobacco abuse, HTN, atrial fibrillation (not on AC given prior GIBs), pulmonary hypertension, HLD, ETOH abuse w/hx of withdrawal seizures, PAD, hypothyroidism, GERD, chronic pancreatitis, pancreatic duct dilation/ IPMN, and suspected cirrhosis presenting to [facility] ER due to altered mental status.  Family reports ongoing exertional SOB since recent discharge.  Recent hospitalization for 1/30- 2/5 left foot pain with ulcers, AKI, and SOB with anasarca treated with IV diureses;  underwent balloon angioplasty with left SFA stent placement with vascular surgery on 2/3.  Wife reported concerns of AMS, SBP 80's, with shaking but pt had returned to his baseline by EMS arrival and refused transport.  An hour later, pt was in respiratory distress and hypoxic.  By arrival to ER by EMS, HR had dropped to 20's and become pulseless.  ACLS measures provided including CPR, intubation, with ROSC after 15 mins.    ER workup significant for plts 82, AG 18, iCa 0.8, AST/ ALT 271/ 95, BNP 1697 (1769 on 1/30), trop 86, lactic > 9, INR 1.5, CXR showing extensive interstitial opacities favoring pulmonary edema with right small to moderate pleural effusion.  SARS 2 positive.  ABG 7.08/ 66/ 97/ 19.7.  Requiring vasopressor support.  CVL placed.  To be transferred to [facility] for higher level of care, PCCM accepted.   Pertinent  Medical History  Tobacco abuse, HTN, atrial fibrillation (not on AC given prior GIBs), pulmonary hypertension, HLD, ETOH abuse w/hx of withdrawal seizures, PAD, hypothyroidism, GERD, chronic pancreatitis, pancreatic duct dilation/ IPMN, suspected cirrhosis  Significant Hospital Events: Including procedures,  antibiotic start and stop dates in addition to other pertinent events   2/11 admit post arrest, intubated, transfer to [facility] ICU 2/13 seizure activity, versed bolus and valproate x1, bloody BM 2/15 arterial line malfunction - concern for radial artery clot. Heparin started. CTA right arm done - nondiagnostic. 2/16 brain MRI without acute findings   Interim History / Subjective:  Poorly responsive, does not open eyes or moves extremities. Low grade fever yesterday, now afebrile.   Objective   Blood pressure (!) 103/55, pulse 100, temperature 99 F (37.2 C), resp. rate (!) 28, height 5' 7.99" (1.727 m), weight 35.9 kg, SpO2 98%. CVP:  [4 mmHg-9 mmHg] 9 mmHg  Vent Mode: PRVC FiO2 (%):  [40 %] 40 % Set Rate:  [24 bmp] 24 bmp Vt Set:  [550 mL] 550 mL PEEP:  [10 cmH20] 10 cmH20 Plateau Pressure:  [20 cmH20-33 cmH20] 27 cmH20   Intake/Output Summary (Last 24 hours) at 08/25/2023 7829 Last data filed at 08/04/2023 0500 Gross per 24 hour  Intake 2007.75 ml  Output 2300 ml  Net -292.25 ml   Filed Weights   08/12/23 0500 08/13/23 0500 08/11/2023 0500  Weight: 42.5 kg 44.6 kg 35.9 kg    Examination: General: chronically ill male, no acute distress HENT: ETT in place Lungs: diminished breath sounds, no wheezing Cardiovascular: irregular rhythm, intermittent mild tachycardia  Abdomen: bowel sounds present, soft, non-distended Extremities: wounds of b/l LE with dressing in place, 1+ LE edema  Neuro: poorly responsive, no purposeful movement GU: foley in place  Resolved Hospital Problem list    IV infiltration RUE of vasopressors  Assessment & Plan:  Acute  hypoxic and hypercarbic respiratory failure Pulmonary Hypertension SARS 2 positive vs pulmonary edema vs r/o CAP Pseudomonas pneumonia Tobacco abuse - full MV support, 4-8cc/kg IBW with goal Pplat <30 and DP<15  - VAP prevention protocol/ PPI - PAD protocol - wean off versed  - adjust peep/ FiO2 as able for SpO2 >90%  -  bronchodilators > brovana, pulmicort, yupleri and prn albuterol  - completed solumedrol 40mg  daily x 5 days  - completed zosyn x 7 days on 2/17 - discussed with wife, palliative medicine consulted   Seizures vs Myoclonic activity Acute metabolic encephalopathy Hx of ETOH abuse- wife reports pt has not been drinking recently  CTH negative for acute intracranial process. EEG showed myoclonic sz every 10-30 seconds early 2/12.  - maintain neuro protective measures; goal for eurothermia, euglycemia, eunatermia, normoxia, and PCO2 goal of 35-40 - neurology following, appreciate assistance   - Keppra 500 BID  - Valproate 250 mg BID - Klonopin 0.5 mg TID - MRI brain without acute findings, titrate down sedating meds  - Seizure precautions   - empiric thiamine   Cardiac arrest- suspect respiratory etiology. With reports of shaking, r/o seizure 2/2 hypoxia, hypoperfusion vs ?ETOH withdrawal seizure - cont tele - does not appear to be ischemic    BRBPR, resolved Internal hemorrhoids Noted bloody BM 2/13 overnight. CBC remains stable. 2024 colonoscopy with internal hemorrhoids and removed polyps tubular adenoma. EGD then showed non-bleeding gastric ulcer. No further episodes of bloody BM. - Trend CBC daily  - On PPI  Afib w RVR, chronic - continue loading IV amio  - not on AC due to prior GIBs, acute on chronic anemia now, prior cardioversion's failed - optimize electrolytes    AKI  Normal renal function in December.  - trend BMP, Cr unchanged (close to last discharge) - cont foley  - strict I/Os, daily wts - avoid nephrotoxins, renal dose meds, hemodynamic support as above - IV lasix 40 daily    Transaminitis Anasarca  Cirrhosis  AST/ALT significantly elevated. Hx pancreatic duct dilation/ IPMN, chronic pancreatitis. Suspect shock liver +/- hepatic congestion, prior concern for cirrhosis on RUQ imaging. lipase normal. Ongoing LE edema.  - trend CMP  - LFTs improving  - Lactulose  20 g BID - IV lasix   Chronic normocytic anemia Thrombocytopenia - H/H at baseline.  Recent PTA plt 160. Stable around 88.  Could be related to liver disease.  PTA ASA/ plavix which are held. - trend CBC, transfuse for Hgb < 7 - restart ASA, holding Plavix    GERD Hx gastric ulcer - PPI    Hypothyroidism  TSH 0.967 on 07/04/23. Not taking synthroid per fill records.    Severe PAD prior recent left SFA balloon angioplasty with stent - Restart ASA, Holding home plavix    Sacral pressure wound, POA BLE foot wounds Protein-calorie malnutrition - hx of ongoing wt loss, suspect his anasarca is masking weight loss - WOC consult  - RD consult    Poor mobility/ deconditioning  - reports limited mobility for months secondary to PAD, suspect PH, and other factors also contributing  - PT/ OT when appropriate    GOC - wife and children present on admission.  Remains full code for now, aware of MODS and high risk for further deterioration.  Has living will, would not want trach or PEG. Monitor him to see if significant improvement, otherwise continue GOC. - will monitor over next 24-48 hours - family agreed for PMT consult  Best Practice (  right click and "Reselect all SmartList Selections" daily)   Diet/type: tubefeeds DVT prophylaxis prophylactic heparin   Pressure ulcer(s): present on admission  GI prophylaxis: PPI Lines: Central line and Arterial Line Foley:  Yes, and it is still needed Code Status:  full code Last date of multidisciplinary goals of care discussion [updated family 2/18]  Labs   CBC: Recent Labs  Lab 08/11/23 0407 08/12/23 0437 08/13/23 0509 08/14/23 0356 08/23/2023 0341  WBC 14.3* 14.1* 11.4* 10.4 8.5  HGB 10.9* 10.2* 9.6* 9.5* 9.0*  HCT 35.6* 33.7* 32.6* 32.3* 31.5*  MCV 89.0 91.1 93.1 95.0 96.0  PLT 88* 70* 76* 85* 86*    Basic Metabolic Panel: Recent Labs  Lab 08/08/23 1220 08/08/23 1742 08/09/23 1700 08/09/23 2039 08/10/23 0420 08/11/23 0407  08/12/23 0437 08/13/23 0509 08/14/23 0356 08/06/2023 0341  NA 136  135   < >  --   --  138 140 144 148* 151* 152*  K 4.4  4.4   < >  --   --  3.7 3.7 3.8 3.2* 3.4* 4.4  CL 96*  102   < >  --   --  101 103 103 104 108 112*  CO2 22   < >  --   --  23 24 26 30 29  33*  GLUCOSE 141*  130*   < >  --   --  219* 227* 188* 132* 164* 178*  BUN 24*  24*   < >  --   --  34* 42* 44* 40* 39* 41*  CREATININE 1.50*  1.60*   < >  --   --  1.66* 1.60* 1.66* 1.38* 1.44* 1.50*  CALCIUM 7.6*   < >  --   --  7.7* 7.5* 7.9* 7.9* 7.9* 8.1*  MG 2.2   < > 2.5*  --  2.2 2.2 2.1  --  2.4 2.4  PHOS 4.6  --  3.0 3.1 2.4* 2.6  --   --   --   --    < > = values in this interval not displayed.   GFR: Estimated Creatinine Clearance: 22.3 mL/min (A) (by C-G formula based on SCr of 1.5 mg/dL (H)). Recent Labs  Lab 08/08/23 1220 08/08/23 1431 08/08/23 1749 08/08/23 2209 08/09/23 0603 08/10/23 0420 08/12/23 0437 08/13/23 0509 08/14/23 0356 08/25/2023 0341  PROCALCITON <0.10  --   --   --  2.04  --   --   --   --   --   WBC 4.9  --   --   --  8.9   < > 14.1* 11.4* 10.4 8.5  LATICACIDVEN >9.0* 8.4* 8.3* 3.5*  --   --   --   --   --   --    < > = values in this interval not displayed.    Liver Function Tests: Recent Labs  Lab 08/11/23 0407 08/12/23 0437 08/13/23 0509 08/14/23 0356 08/04/2023 0341  AST 323* 204* 122* 83* 59*  ALT 200* 148* 102* 80* 60*  ALKPHOS 73 78 71 73 66  BILITOT 1.2 1.0 1.1 0.9 1.0  PROT 5.2* 5.3* 5.5* 6.8 5.6*  ALBUMIN 2.1* 2.2* 2.0* 2.0* 1.9*   Recent Labs  Lab 08/09/23 0603  LIPASE 18   Recent Labs  Lab 08/08/23 2209 08/12/23 0436 07/31/2023 0341  AMMONIA 40* 29 23    ABG    Component Value Date/Time   PHART 7.577 (H) 08/09/2023 1108   PCO2ART 27.5 (L) 08/09/2023 1108  PO2ART 61 (L) 08/09/2023 1108   HCO3 25.6 08/09/2023 1108   TCO2 26 08/09/2023 1108   ACIDBASEDEF 2.0 08/08/2023 1742   O2SAT 95 08/09/2023 1108     Coagulation Profile: Recent Labs  Lab  08/08/23 1220 08/09/23 2039  INR 1.5* 1.5*    Cardiac Enzymes: No results for input(s): "CKTOTAL", "CKMB", "CKMBINDEX", "TROPONINI" in the last 168 hours.  HbA1C: Hgb A1c MFr Bld  Date/Time Value Ref Range Status  08/09/2023 10:54 AM 6.7 (H) 4.8 - 5.6 % Final    Comment:    (NOTE) Pre diabetes:          5.7%-6.4%  Diabetes:              >6.4%  Glycemic control for   <7.0% adults with diabetes     CBG: Recent Labs  Lab 08/14/23 1621 08/14/23 2000 08/14/23 2356 08/16/2023 0014 08/12/2023 0317  GLUCAP 181* 72 389* 156* 146*      Critical care time:

## 2023-08-26 DEATH — deceased

## 2023-09-06 ENCOUNTER — Ambulatory Visit: Payer: PPO | Admitting: Gastroenterology

## 2023-09-15 ENCOUNTER — Encounter (HOSPITAL_COMMUNITY): Payer: PPO

## 2023-10-09 ENCOUNTER — Other Ambulatory Visit (HOSPITAL_COMMUNITY): Payer: PPO

## 2023-10-09 ENCOUNTER — Encounter (HOSPITAL_COMMUNITY): Payer: PPO

## 2023-10-09 ENCOUNTER — Ambulatory Visit: Payer: PPO

## 2024-06-21 ENCOUNTER — Ambulatory Visit (INDEPENDENT_AMBULATORY_CARE_PROVIDER_SITE_OTHER): Payer: PPO
